# Patient Record
Sex: Male | Born: 1945 | Race: White | Hispanic: No | Marital: Married | Smoking: Former smoker
Health system: Southern US, Community
[De-identification: ages and names within clinical notes are randomized; demographics above are authoritative.]

## PROBLEM LIST (undated history)

## (undated) DIAGNOSIS — I219 Acute myocardial infarction, unspecified: Secondary | ICD-10-CM

## (undated) DIAGNOSIS — L409 Psoriasis, unspecified: Secondary | ICD-10-CM

## (undated) DIAGNOSIS — E785 Hyperlipidemia, unspecified: Secondary | ICD-10-CM

## (undated) DIAGNOSIS — E119 Type 2 diabetes mellitus without complications: Secondary | ICD-10-CM

## (undated) DIAGNOSIS — C9591 Leukemia, unspecified, in remission: Secondary | ICD-10-CM

## (undated) DIAGNOSIS — R0600 Dyspnea, unspecified: Secondary | ICD-10-CM

## (undated) DIAGNOSIS — G459 Transient cerebral ischemic attack, unspecified: Secondary | ICD-10-CM

## (undated) DIAGNOSIS — M1990 Unspecified osteoarthritis, unspecified site: Secondary | ICD-10-CM

## (undated) DIAGNOSIS — K8020 Calculus of gallbladder without cholecystitis without obstruction: Secondary | ICD-10-CM

## (undated) DIAGNOSIS — I10 Essential (primary) hypertension: Secondary | ICD-10-CM

## (undated) DIAGNOSIS — N400 Enlarged prostate without lower urinary tract symptoms: Secondary | ICD-10-CM

## (undated) DIAGNOSIS — Z955 Presence of coronary angioplasty implant and graft: Secondary | ICD-10-CM

## (undated) DIAGNOSIS — I209 Angina pectoris, unspecified: Secondary | ICD-10-CM

## (undated) DIAGNOSIS — Z8719 Personal history of other diseases of the digestive system: Secondary | ICD-10-CM

## (undated) DIAGNOSIS — K219 Gastro-esophageal reflux disease without esophagitis: Secondary | ICD-10-CM

## (undated) DIAGNOSIS — I2510 Atherosclerotic heart disease of native coronary artery without angina pectoris: Secondary | ICD-10-CM

## (undated) DIAGNOSIS — I00 Rheumatic fever without heart involvement: Secondary | ICD-10-CM

## (undated) DIAGNOSIS — K5790 Diverticulosis of intestine, part unspecified, without perforation or abscess without bleeding: Secondary | ICD-10-CM

## (undated) DIAGNOSIS — K2270 Barrett's esophagus without dysplasia: Secondary | ICD-10-CM

## (undated) HISTORY — PX: POLYPECTOMY: SHX149

## (undated) HISTORY — DX: Rheumatic fever without heart involvement: I00

## (undated) HISTORY — PX: CORONARY ARTERY BYPASS GRAFT: SHX141

## (undated) HISTORY — DX: Essential (primary) hypertension: I10

## (undated) HISTORY — DX: Type 2 diabetes mellitus without complications: E11.9

## (undated) HISTORY — DX: Hyperlipidemia, unspecified: E78.5

## (undated) HISTORY — DX: Diverticulosis of intestine, part unspecified, without perforation or abscess without bleeding: K57.90

## (undated) HISTORY — DX: Unspecified osteoarthritis, unspecified site: M19.90

## (undated) HISTORY — DX: Benign prostatic hyperplasia without lower urinary tract symptoms: N40.0

## (undated) HISTORY — PX: APPENDECTOMY: SHX54

## (undated) HISTORY — PX: TONSILLECTOMY AND ADENOIDECTOMY: SUR1326

## (undated) HISTORY — PX: UMBILICAL HERNIA REPAIR: SHX196

## (undated) HISTORY — DX: Barrett's esophagus without dysplasia: K22.70

## (undated) HISTORY — DX: Calculus of gallbladder without cholecystitis without obstruction: K80.20

## (undated) HISTORY — DX: Atherosclerotic heart disease of native coronary artery without angina pectoris: I25.10

## (undated) HISTORY — PX: CORONARY ANGIOPLASTY: SHX604

## (undated) HISTORY — DX: Presence of coronary angioplasty implant and graft: Z95.5

---

## 1996-06-23 DIAGNOSIS — Z955 Presence of coronary angioplasty implant and graft: Secondary | ICD-10-CM

## 1996-06-23 HISTORY — DX: Presence of coronary angioplasty implant and graft: Z95.5

## 2001-12-15 ENCOUNTER — Encounter: Payer: Self-pay | Admitting: Emergency Medicine

## 2001-12-15 ENCOUNTER — Inpatient Hospital Stay (HOSPITAL_COMMUNITY): Admission: EM | Admit: 2001-12-15 | Discharge: 2001-12-22 | Payer: Self-pay | Admitting: Emergency Medicine

## 2002-05-04 ENCOUNTER — Inpatient Hospital Stay (HOSPITAL_COMMUNITY): Admission: EM | Admit: 2002-05-04 | Discharge: 2002-05-06 | Payer: Self-pay | Admitting: Internal Medicine

## 2002-05-23 ENCOUNTER — Encounter: Payer: Self-pay | Admitting: *Deleted

## 2002-05-23 ENCOUNTER — Inpatient Hospital Stay (HOSPITAL_COMMUNITY): Admission: EM | Admit: 2002-05-23 | Discharge: 2002-05-24 | Payer: Self-pay | Admitting: *Deleted

## 2002-08-05 ENCOUNTER — Inpatient Hospital Stay (HOSPITAL_COMMUNITY): Admission: EM | Admit: 2002-08-05 | Discharge: 2002-08-23 | Payer: Self-pay | Admitting: Emergency Medicine

## 2002-08-05 ENCOUNTER — Encounter: Payer: Self-pay | Admitting: Emergency Medicine

## 2002-08-16 ENCOUNTER — Encounter: Payer: Self-pay | Admitting: Cardiothoracic Surgery

## 2002-08-17 ENCOUNTER — Encounter: Payer: Self-pay | Admitting: Cardiothoracic Surgery

## 2002-08-18 ENCOUNTER — Encounter: Payer: Self-pay | Admitting: Cardiothoracic Surgery

## 2002-08-19 ENCOUNTER — Encounter: Payer: Self-pay | Admitting: Cardiothoracic Surgery

## 2002-08-20 ENCOUNTER — Encounter: Payer: Self-pay | Admitting: Cardiothoracic Surgery

## 2004-12-30 ENCOUNTER — Encounter: Payer: Self-pay | Admitting: Internal Medicine

## 2004-12-30 ENCOUNTER — Ambulatory Visit: Payer: Self-pay | Admitting: Internal Medicine

## 2004-12-30 ENCOUNTER — Inpatient Hospital Stay (HOSPITAL_COMMUNITY): Admission: EM | Admit: 2004-12-30 | Discharge: 2005-01-01 | Payer: Self-pay | Admitting: Emergency Medicine

## 2004-12-31 ENCOUNTER — Ambulatory Visit: Payer: Self-pay | Admitting: Cardiology

## 2006-01-29 ENCOUNTER — Ambulatory Visit: Payer: Self-pay | Admitting: Cardiology

## 2006-02-09 ENCOUNTER — Ambulatory Visit: Payer: Self-pay | Admitting: Internal Medicine

## 2006-03-09 ENCOUNTER — Ambulatory Visit: Payer: Self-pay | Admitting: Cardiology

## 2006-03-27 ENCOUNTER — Ambulatory Visit: Payer: Self-pay | Admitting: Cardiovascular Disease

## 2006-03-27 ENCOUNTER — Ambulatory Visit: Payer: Self-pay

## 2006-03-27 ENCOUNTER — Encounter: Payer: Self-pay | Admitting: Cardiology

## 2006-04-03 ENCOUNTER — Ambulatory Visit: Payer: Self-pay | Admitting: Cardiology

## 2006-10-14 ENCOUNTER — Ambulatory Visit: Payer: Self-pay | Admitting: Cardiology

## 2006-10-14 LAB — CONVERTED CEMR LAB
ALT: 22 units/L (ref 0–40)
AST: 26 units/L (ref 0–37)
Albumin: 3.7 g/dL (ref 3.5–5.2)
Alkaline Phosphatase: 53 units/L (ref 39–117)
HDL: 37.6 mg/dL — ABNORMAL LOW (ref >39.0)
Total Bilirubin: 1.1 mg/dL (ref 0.3–1.2)
Total Protein: 6.4 g/dL (ref 6.0–8.3)

## 2007-04-12 ENCOUNTER — Ambulatory Visit: Payer: Self-pay | Admitting: Internal Medicine

## 2007-05-05 ENCOUNTER — Encounter
Admission: RE | Admit: 2007-05-05 | Discharge: 2007-08-03 | Payer: Self-pay | Admitting: Physical Medicine & Rehabilitation

## 2007-05-05 ENCOUNTER — Ambulatory Visit: Payer: Self-pay | Admitting: Physical Medicine & Rehabilitation

## 2007-05-24 ENCOUNTER — Ambulatory Visit (HOSPITAL_COMMUNITY)
Admission: RE | Admit: 2007-05-24 | Discharge: 2007-05-24 | Payer: Self-pay | Admitting: Physical Medicine & Rehabilitation

## 2007-05-31 ENCOUNTER — Ambulatory Visit: Payer: Self-pay | Admitting: Internal Medicine

## 2007-06-14 ENCOUNTER — Ambulatory Visit: Payer: Self-pay | Admitting: Physical Medicine & Rehabilitation

## 2007-07-22 ENCOUNTER — Ambulatory Visit: Payer: Self-pay | Admitting: Internal Medicine

## 2007-07-22 LAB — CONVERTED CEMR LAB
CO2: 31 meq/L (ref 19–32)
Chloride: 103 meq/L (ref 96–112)
Creatinine, Ser: 1 mg/dL (ref 0.4–1.5)
GFR calc non Af Amer: 81 mL/min
Potassium: 3.4 meq/L — ABNORMAL LOW (ref 3.5–5.1)
Sodium: 140 meq/L (ref 135–145)

## 2007-08-04 ENCOUNTER — Ambulatory Visit: Payer: Self-pay | Admitting: Internal Medicine

## 2007-08-04 LAB — CONVERTED CEMR LAB
BUN: 15 mg/dL (ref 6–23)
Chloride: 105 meq/L (ref 96–112)
Creatinine, Ser: 1.1 mg/dL (ref 0.4–1.5)
GFR calc non Af Amer: 73 mL/min
Glucose, Bld: 163 mg/dL — ABNORMAL HIGH (ref 70–99)

## 2007-09-03 ENCOUNTER — Ambulatory Visit: Payer: Self-pay | Admitting: Internal Medicine

## 2007-09-03 LAB — CONVERTED CEMR LAB
CO2: 30 meq/L (ref 19–32)
Creatinine, Ser: 1 mg/dL (ref 0.4–1.5)
GFR calc Af Amer: 98 mL/min
GFR calc non Af Amer: 81 mL/min
Hgb A1c MFr Bld: 6.3 % — ABNORMAL HIGH (ref 4.6–6.0)
Sodium: 139 meq/L (ref 135–145)
TSH: 2.48 microintl units/mL (ref 0.35–5.50)

## 2007-09-07 ENCOUNTER — Ambulatory Visit: Payer: Self-pay | Admitting: Internal Medicine

## 2007-10-11 ENCOUNTER — Ambulatory Visit: Payer: Self-pay | Admitting: Internal Medicine

## 2007-10-11 LAB — CONVERTED CEMR LAB
BUN: 16 mg/dL (ref 6–23)
CO2: 30 meq/L (ref 19–32)
Calcium: 9.9 mg/dL (ref 8.4–10.5)
Chloride: 110 meq/L (ref 96–112)
Creatinine, Ser: 0.9 mg/dL (ref 0.4–1.5)
GFR calc Af Amer: 111 mL/min
GFR calc non Af Amer: 92 mL/min
Glucose, Bld: 90 mg/dL (ref 70–99)
Potassium: 4 meq/L (ref 3.5–5.1)
Sodium: 144 meq/L (ref 135–145)
TSH: 0.75 microintl units/mL (ref 0.35–5.50)

## 2008-02-14 ENCOUNTER — Ambulatory Visit: Payer: Self-pay | Admitting: Internal Medicine

## 2008-03-17 ENCOUNTER — Ambulatory Visit: Payer: Self-pay | Admitting: Internal Medicine

## 2008-03-17 LAB — CONVERTED CEMR LAB
BUN: 18 mg/dL (ref 6–23)
CO2: 30 meq/L (ref 19–32)
Chloride: 109 meq/L (ref 96–112)
Creatinine, Ser: 0.8 mg/dL (ref 0.4–1.5)
Glucose, Bld: 107 mg/dL — ABNORMAL HIGH (ref 70–99)
Potassium: 4.1 meq/L (ref 3.5–5.1)
Sodium: 142 meq/L (ref 135–145)

## 2008-06-21 DIAGNOSIS — E785 Hyperlipidemia, unspecified: Secondary | ICD-10-CM | POA: Insufficient documentation

## 2008-06-21 DIAGNOSIS — I10 Essential (primary) hypertension: Secondary | ICD-10-CM

## 2008-06-21 DIAGNOSIS — I2510 Atherosclerotic heart disease of native coronary artery without angina pectoris: Secondary | ICD-10-CM | POA: Insufficient documentation

## 2008-07-13 ENCOUNTER — Ambulatory Visit: Payer: Self-pay | Admitting: Internal Medicine

## 2008-07-13 LAB — CONVERTED CEMR LAB
ALT: 18 units/L (ref 0–53)
AST: 21 units/L (ref 0–37)
Calcium: 9.5 mg/dL (ref 8.4–10.5)
Chloride: 105 meq/L (ref 96–112)
GFR calc Af Amer: 127 mL/min
GFR calc non Af Amer: 105 mL/min
Sodium: 143 meq/L (ref 135–145)

## 2008-08-11 ENCOUNTER — Ambulatory Visit: Payer: Self-pay | Admitting: Internal Medicine

## 2008-09-17 ENCOUNTER — Emergency Department (HOSPITAL_COMMUNITY): Admission: EM | Admit: 2008-09-17 | Discharge: 2008-09-18 | Payer: Self-pay | Admitting: Podiatry

## 2008-09-25 ENCOUNTER — Ambulatory Visit: Payer: Self-pay | Admitting: Internal Medicine

## 2008-09-25 ENCOUNTER — Encounter: Payer: Self-pay | Admitting: Internal Medicine

## 2008-11-07 ENCOUNTER — Telehealth (INDEPENDENT_AMBULATORY_CARE_PROVIDER_SITE_OTHER): Payer: Self-pay | Admitting: *Deleted

## 2009-04-04 ENCOUNTER — Telehealth: Payer: Self-pay | Admitting: Internal Medicine

## 2009-05-28 ENCOUNTER — Telehealth: Payer: Self-pay | Admitting: Internal Medicine

## 2009-06-08 ENCOUNTER — Telehealth: Payer: Self-pay | Admitting: Internal Medicine

## 2009-06-11 ENCOUNTER — Encounter: Payer: Self-pay | Admitting: Internal Medicine

## 2009-06-12 ENCOUNTER — Telehealth: Payer: Self-pay | Admitting: Internal Medicine

## 2009-06-14 ENCOUNTER — Telehealth: Payer: Self-pay | Admitting: Internal Medicine

## 2009-06-18 ENCOUNTER — Telehealth: Payer: Self-pay | Admitting: Internal Medicine

## 2009-06-19 ENCOUNTER — Encounter: Payer: Self-pay | Admitting: Internal Medicine

## 2009-06-28 ENCOUNTER — Telehealth: Payer: Self-pay | Admitting: Internal Medicine

## 2009-07-01 ENCOUNTER — Inpatient Hospital Stay (HOSPITAL_COMMUNITY): Admission: EM | Admit: 2009-07-01 | Discharge: 2009-07-01 | Payer: Self-pay | Admitting: Emergency Medicine

## 2009-07-01 ENCOUNTER — Ambulatory Visit: Payer: Self-pay | Admitting: Cardiology

## 2009-07-02 ENCOUNTER — Telehealth: Payer: Self-pay | Admitting: Internal Medicine

## 2009-07-04 ENCOUNTER — Telehealth (INDEPENDENT_AMBULATORY_CARE_PROVIDER_SITE_OTHER): Payer: Self-pay | Admitting: *Deleted

## 2009-07-26 ENCOUNTER — Encounter: Payer: Self-pay | Admitting: Internal Medicine

## 2009-08-06 ENCOUNTER — Ambulatory Visit: Payer: Self-pay | Admitting: Internal Medicine

## 2009-09-15 ENCOUNTER — Encounter: Payer: Self-pay | Admitting: Internal Medicine

## 2010-01-21 ENCOUNTER — Ambulatory Visit: Payer: Self-pay | Admitting: Internal Medicine

## 2010-02-19 ENCOUNTER — Encounter: Payer: Self-pay | Admitting: Internal Medicine

## 2010-03-08 ENCOUNTER — Telehealth (INDEPENDENT_AMBULATORY_CARE_PROVIDER_SITE_OTHER): Payer: Self-pay | Admitting: *Deleted

## 2010-03-14 ENCOUNTER — Telehealth: Payer: Self-pay | Admitting: Internal Medicine

## 2010-03-26 ENCOUNTER — Telehealth: Payer: Self-pay | Admitting: Internal Medicine

## 2010-07-23 NOTE — Assessment & Plan Note (Signed)
Summary: eph/per nurse Nivida/jss  Medications Added POTASSIUM CHLORIDE CRYS CR 20 MEQ CR-TABS (POTASSIUM CHLORIDE CRYS CR) 1 by mouth daily LISINOPRIL 20 MG TABS (LISINOPRIL) 1 by mouth two times a day TRIAMTERENE-HCTZ 37.5-25 MG TABS (TRIAMTERENE-HCTZ) 1 by mouth daily VERAPAMIL HCL CR 180 MG CR-TABS (VERAPAMIL HCL) 1 by mouth daily ALLOPURINOL 300 MG TABS (ALLOPURINOL) 1 by mouth daily AMLODIPINE BESYLATE 5 MG TABS (AMLODIPINE BESYLATE) 1 tablet every day      Allergies Added:   Primary Provider:  Dara Lords, M.D.   History of Present Illness: Mr. Edmondson is a 65 year old gentleman. He was last seen in the fall. He has a history of CAD, hypertension, dyslipidemia.  The patient is status post CABG in 1991 had an inferior wall MI 1998 redo CABG in 2004 (left radial artery to PDA; SVG to OM1/distal circumflex). I kept him on Plavix therapy because of extensive disease.  since seen, he has had some problems with his bp medicines with frequent calls, nurse visits.  He actually had 1 ER visit in January of this year.  See dictation.  He was placed on verapamil.  Note he also had chest pressure during this visit.  He did not have any work up scheduled.  Since that visit he denies chest pain.  His breathing has been ok.  His bp has been lablile, maybe improving of late.  He was seen at the [place] in salsibury.  He had labs drawn.  Suggestion was made for Norvasc instead of Verapamil because of possible drug interaction with simvistatin.  Current Medications (verified): 1)  Avodart 0.5 Mg Caps (Dutasteride) .Marland Kitchen.. 1 Once Daily 2)  Prilosec 20 Mg Cpdr (Omeprazole) 3)  Aspirin Adult Low Strength 81 Mg Tbec (Aspirin) .... Once Daily 4)  Mens Multivitamin Plus  Tabs (Multiple Vitamins-Minerals) .Marland Kitchen.. 1 Once Daily 5)  Potassium Chloride Crys Cr 20 Meq Cr-Tabs (Potassium Chloride Crys Cr) .Marland Kitchen.. 1 By Mouth Daily 6)  Plavix 75 Mg Tabs (Clopidogrel Bisulfate) .... Take 1 Tab P.m. 7)  Fish Oil 1200 Mg  Caps (Omega-3 Fatty Acids) .Marland Kitchen.. 1 Cap Evening Only 8)  Metoprolol Succinate 50 Mg Xr24h-Tab (Metoprolol Succinate) .Marland Kitchen.. 1 Tablet Every Day 9)  Simvastatin 80 Mg Tabs (Simvastatin) .Marland Kitchen.. 1 Tablet Every Day At Bedtime 10)  Lisinopril 20 Mg Tabs (Lisinopril) .Marland Kitchen.. 1 By Mouth Two Times A Day 11)  Triamterene-Hctz 37.5-25 Mg Tabs (Triamterene-Hctz) .Marland Kitchen.. 1 By Mouth Daily 12)  Verapamil Hcl Cr 180 Mg Cr-Tabs (Verapamil Hcl) .Marland Kitchen.. 1 By Mouth Daily 13)  Allopurinol 300 Mg Tabs (Allopurinol) .Marland Kitchen.. 1 By Mouth Daily  Allergies (verified): 1)  ! Ticlid 2)  ! Codeine  Past History:  Past Medical History: Last updated: 07-12-08 CAD:  CABG 1991, 2004;  IWMI  1998.  S/P PTCA  stensts to SVG ot OM last in 2003. Hypertension Dyslipidemia BPH Gout Hx rheumatic fever as a child.   Past Surgical History: Last updated: 2008-07-12 CABG 1991 (LIMA to LAD/Diag;  SVG to RCA;  SVG to OM) CABG 2004 (L radial to PDA; SVG to OM1/ distal LCx) s/ p tonsillectomy s/p appendectomy s/ hernia repair  Family History: Last updated: 07-12-2008 Father died age 64 of heart problems Mother:  Hx CAD, CVA, DM Paternal GF:  AAA.  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  Vital Signs:  Patient profile:   65 year old male Height:      72 inches Weight:  203 pounds BMI:     27.63 Pulse rate:   58 / minute Resp:     16 per minute BP sitting:   210 / 96  (right arm)  Vitals Entered By: Marrion Coy, CNA (August 06, 2009 4:10 PM)  Physical Exam  Additional Exam:  HEENT:  Normocephalic, atraumatic. EOMI, PERRLA.  Neck: JVP is normal. No thyromegaly. No bruits.  Lungs: clear to auscultation. No rales no wheezes.  Heart: Regular rate and rhythm. Normal S1, S2. No S3.   No significant murmurs. PMI not displaced.  Abdomen:  Supple, nontender. Normal bowel sounds. No masses. No hepatomegaly.  Extremities:   Good distal pulses  throughout. No lower extremity edema.  Musculoskeletal :moving all extremities.  Neuro:   alert and oriented x3.    EKG  Procedure date:  08/06/2009  Findings:      Sinus bradycardia  58 bpm.  Impression & Recommendations:  Problem # 1:  HYPERTENSION, BENIGN (ICD-401.1) This has been very difficult to control  The patient was on Norvasc in the past then had problems with it.  BP is high on arrival to clinic but improved to 148/90.  I would recomm stopping verapamil.  Start norvasc 2.5 mg per day.   I need labs that were drawn at the [place] on 2/3.  He is on an ACEI, K, Maxzide.   He will fax. Send BP log in 4 wks.  Problem # 2:  HYPERLIPIDEMIA-MIXED (ICD-272.4) Continue.  Patient to send labs. His updated medication list for this problem includes:    Simvastatin 80 Mg Tabs (Simvastatin) .Marland Kitchen... 1 tablet every day at bedtime  Problem # 3:  CAD, NATIVE VESSEL (ICD-414.01) I am not convinced of any active issues.  Continue meds.  Patient Instructions: 1)  Your physician has recommended you make the following change in your medication: start NORVASC 5 mg one half every day 2)  Your physician wants you to follow-up in: 6 months  You will receive a reminder letter in the mail two months in advance. If you don't receive a letter, please call our office to schedule the follow-up appointment. Prescriptions: AMLODIPINE BESYLATE 5 MG TABS (AMLODIPINE BESYLATE) 1 tablet every day  #30 x 6   Entered by:   Layne Benton, RN, BSN   Authorized by:   Sherrill Raring, MD, [facility]   Signed by:   Layne Benton, RN, BSN on 08/06/2009   Method used:   [facility]  [address] [phone]       [address]       [place], [place]  [address]       Ph: [phone] or [phone]       Fax: [phone]   RxID:   [id number]

## 2010-07-23 NOTE — Progress Notes (Signed)
Summary: refill meds    Phone Note Refill Request Call back at Home Phone [phone] Message from:  Patient on March 26, 2010 4:32 PM  Refills Requested: Medication #1:  POTASSIUM CHLORIDE CRYS CR 20 MEQ CR-TABS 1 by mouth [facility] [phone]   Method Requested: Fax to Local Pharmacy Initial call taken by: Lorne Skeens,  March 26, 2010 4:33 PM    Prescriptions: POTASSIUM CHLORIDE CRYS CR 20 MEQ CR-TABS (POTASSIUM CHLORIDE CRYS CR) 1 by mouth daily  #30 x 6   Entered by:   Burnett Kanaris, CNA   Authorized by:   Sherrill Raring, MD, [facility]   Signed by:   Burnett Kanaris, CNA on 03/27/2010   Method used:   [facility]  [address] [phone]* (retail)       [address]       [place], [place]  [address]       Ph: [phone] or [phone]       Fax: [phone]   RxID:   [id number]

## 2010-07-23 NOTE — Assessment & Plan Note (Signed)
Summary: per check out/sf  Medications Added PRILOSEC 20 MG CPDR (OMEPRAZOLE) 1 tab two times a day AMLODIPINE BESYLATE 5 MG TABS (AMLODIPINE BESYLATE) 1/2 tab once daily      Allergies Added:   Visit Type:  Follow-up Primary Provider:  Dara Stone, M.D.  CC:  no complaints.  History of Present Illness: Brandon Stone is a 65 year old gentleman. He was last seen in the fall. He has a history of CAD, hypertension, dyslipidemia.  The patient is status post CABG in 1991 had an inferior wall MI 1998 redo CABG in 2004 (left radial artery to PDA; SVG to OM1/distal circumflex). I kept him on Plavix therapy because of extensive disease. I saw him in clinc in February.  Since then he mailed in readings of his BPs which are overall in fairly good control. SInce seen he has done well.  NO chest pain  No shortness of breath.  Current Medications (verified): 1)  Avodart 0.5 Mg Caps (Dutasteride) .Marland Kitchen.. 1 Once Daily 2)  Prilosec 20 Mg Cpdr (Omeprazole) .Marland Kitchen.. 1 Tab Two Times A Day 3)  Aspirin Adult Low Strength 81 Mg Tbec (Aspirin) .... Once Daily 4)  Mens Multivitamin Plus  Tabs (Multiple Vitamins-Minerals) .Marland Kitchen.. 1 Once Daily 5)  Potassium Chloride Crys Cr 20 Meq Cr-Tabs (Potassium Chloride Crys Cr) .Marland Kitchen.. 1 By Mouth Daily 6)  Plavix 75 Mg Tabs (Clopidogrel Bisulfate) .... Take 1 Tab P.m. 7)  Fish Oil 1200 Mg Caps (Omega-3 Fatty Acids) .Marland Kitchen.. 1 Cap Evening Only 8)  Metoprolol Succinate 50 Mg Xr24h-Tab (Metoprolol Succinate) .Marland Kitchen.. 1 Tablet Every Day 9)  Simvastatin 80 Mg Tabs (Simvastatin) .Marland Kitchen.. 1 Tablet Every Day At Bedtime 10)  Lisinopril 20 Mg Tabs (Lisinopril) .Marland Kitchen.. 1 By Mouth Two Times A Day 11)  Triamterene-Hctz 37.5-25 Mg Tabs (Triamterene-Hctz) .Marland Kitchen.. 1 By Mouth Daily 12)  Allopurinol 300 Mg Tabs (Allopurinol) .Marland Kitchen.. 1 By Mouth Daily 13)  Amlodipine Besylate 5 Mg Tabs (Amlodipine Besylate) .... 1/2 Tab Once Daily  Allergies (verified): 1)  ! Ticlid 2)  ! Codeine  Past History:  Past medical,  surgical, family and social histories (including risk factors) reviewed, and no changes noted (except as noted below).  Past Medical History: Reviewed history from 06/21/2008 and no changes required. CAD:  CABG 1991, 2004;  IWMI  1998.  S/P PTCA  stensts to SVG ot OM last in 2003. Hypertension Dyslipidemia BPH Gout Hx rheumatic fever as a child.   Past Surgical History: Reviewed history from 06/21/2008 and no changes required. CABG 1991 (LIMA to LAD/Diag;  SVG to RCA;  SVG to OM) CABG 2004 (L radial to PDA; SVG to OM1/ distal LCx) s/ p tonsillectomy s/p appendectomy s/ hernia repair  Family History: Reviewed history from 06/21/2008 and no changes required. Father died age 11 of heart problems Mother:  Hx CAD, CVA, DM Paternal GF:  AAA.  Social History: Reviewed history from 09/25/2008 and no changes required. 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  Vital Signs:  Patient profile:   65 year old male Height:      72 inches Weight:      208 pounds BMI:     28.31 Pulse rate:   64 / minute BP sitting:   132 / 80  (left arm) Cuff size:   regular  Vitals Entered By: Brandon Kanaris, CNA (January 21, 2010 4:20 PM)  Physical Exam  Additional Exam:  Patient is in NAD HEENT:  Normocephalic, atraumatic. EOMI,  PERRLA.  Neck: JVP is normal. No thyromegaly. No bruits.  Lungs: clear to auscultation. No rales no wheezes.  Heart: Regular rate and rhythm. Normal S1, S2. No S3.   No significant murmurs. PMI not displaced.  Abdomen:  Supple, nontender. Normal bowel sounds. No masses. No hepatomegaly.  Extremities:   Good distal pulses throughout. No lower extremity edema.  Musculoskeletal :moving all extremities.  Neuro:   alert and oriented x3.    EKG  Procedure date:  01/21/2010  Findings:      NSR.  65 bpm..  Inferior MI.  Impression & Recommendations:  Problem # 1:  CAD, NATIVE VESSEL (ICD-414.01) Stable.  No signs to suggesti  ischemia.  Keep on same regimen.  Problem # 2:  HYPERLIPIDEMIA-MIXED (ICD-272.4) I have asked him to cut Zocor to 40 once daily.  He is due to have lipids checked at [place].  Will have labs faxed here. His updated medication list for this problem includes:    Simvastatin 80 Mg Tabs (Simvastatin) .Marland Kitchen... 1 tablet every day at bedtime  Problem # 3:  HYPERTENSION, BENIGN (ICD-401.1) BP readings at home recently 120s to 160s, mainly in 130s.  Keep on same regimen.  Patient Instructions: 1)  Your physician wants you to follow-up in: APRIL 2012  You will receive a reminder letter in the mail two months in advance. If you don't receive a letter, please call our office to schedule the follow-up appointment.

## 2010-07-23 NOTE — Progress Notes (Signed)
Summary: QUESTIONS ABOUT MEDICATIONS   Phone Note Call from Patient Call back at Work Phone [phone]   Caller: Patient Summary of Call: PT CALLING WITH QUESTION ABOUT HIS MEDICATIONS Initial call taken by: Judie Grieve,  July 02, 2009 11:50 AM  Follow-up for Phone Call        spoke with patient- According to pt. he was in [facility]  this past weekend with high B/P. While in the hospital pt. was seen by Dr. Tenny Craw MD. Pt. states the instructions given for him was  to call the office for Dr. Tenny Craw to regulate his B/P medications. I let pt. know I will review the hospital  D/C notes from [facility] . I will call him back asap. Ollen Gross, RN, BSN  July 02, 2009 1:49 PM Called pt. back regarding Cardiology consult note per Dr. Tenny Craw. pt. needs a F/U appointment  with Dr. Tenny Craw in the office. Pt. vebalized understanding. Pt's phone call was transfer to [facility] scheduler, for patient to  make a F/U appointment  with  Dr. Tenny Craw.  Follow-up by: Ollen Gross, RN, BSN,  July 02, 2009 2:13 PM

## 2010-07-23 NOTE — Progress Notes (Signed)
Summary: rtn call to Dr. Tenny Craw- LM  Medications Added METOPROLOL TARTRATE 25 MG TABS (METOPROLOL TARTRATE) Take one tablet by mouth twice a day CRESTOR 10 MG TABS (ROSUVASTATIN CALCIUM) Take one tablet by mouth daily. DIOVAN 80 MG TABS (VALSARTAN) Please take one pill by mouth two times a day.       Phone Note Call from Patient Call back at Home Phone [phone]   Caller: Patient Reason for Call: Talk to Nurse, Talk to Doctor Summary of Call: pt rtn call to Dr. Tenny Craw to obtain medication changes Initial call taken by: Omer Jack,  March 14, 2010 11:42 AM  Follow-up for Phone Call        left pt a voicemail. Whitney Maeola Sarah RN  March 14, 2010 11:53 AM  Spoke to pt. who stated that the [place] already changed his medication to Crestor 10mg  by mouth daily on Aug.30th when he had his blood work drawn. He is having blood work done at the [place] to check his LFT/lipids in mid October and will have them fax that [facility] [place] 25mg  by mouth two times a day.    New/Updated Medications: METOPROLOL TARTRATE 25 MG TABS (METOPROLOL TARTRATE) Take one tablet by mouth twice a day CRESTOR 10 MG TABS (ROSUVASTATIN CALCIUM) Take one tablet by mouth daily. DIOVAN 80 MG TABS (VALSARTAN) Please take one pill by mouth two times a day.

## 2010-07-23 NOTE — Progress Notes (Signed)
   Labs recieved from [facility] gave to [facility] to scan into EMR. Brandon Stone  March 08, 2010 9:29 AM'

## 2010-07-23 NOTE — Progress Notes (Signed)
Summary: Patients At Home Vitals  Patients At Home Vitals   Imported By: Roderic Ovens 09/27/2009 11:55:52  _____________________________________________________________________  External Attachment:    Type:   Image     Comment:   External Document

## 2010-07-23 NOTE — Consult Note (Signed)
Summary: Consultation Report [facility]  Consultation Report - [facility]   Imported By: Marylou Mccoy 07/20/2009 09:09:44  _____________________________________________________________________  External Attachment:    Type:   Image     Comment:   External Document

## 2010-07-23 NOTE — Progress Notes (Signed)
Summary: review meds   Phone Note Call from Patient Call back at Work Phone [phone]   Caller: Patient Reason for Call: Talk to Nurse Summary of Call: pt just got out of hospital and there was some medication changes and he is very confused and wanted to come into the office today for someone to go over his meds with him Initial call taken by: Omer Jack,  July 04, 2009 12:20 PM  Follow-up for Phone Call        pt to come by the office to review meds.  Sander Nephew, RN PT CAME IN BUT FORGOT HIS MEDICATION LIST.  WE REVIEWED MEDICATIONS AS LISTED ON DISCHARGE INSTRUCTIONS HOWEVER THERE ARE STILL DIFFERENENCES.  PT WILL FAX HIS MED LIST AND THE ONE HE WAS GIVEN AT HOSPITAL FOR OUT REVIEW. I RECIEVED FAXED MEDICATION LISTS AND THEY WERE REVIEWED WITH TOM PICKERING, PHM.  ACCORDING TO CONSULT NOTE FROM DR ROTHBART PT SHOULD STOP HCTZ START DYAZIDE 37.5/25MG  DAILY,  START VERAPIMIL 180 MG DAILY,  DECREASE POTASSIUM TO 20 MEQ DAILY AND TAKE NIFEDIPINE ONLY IF BP IS ABOVE 140/90. ALL OTHER MEDS TO REMAIN THE SAME.  ATTEPMTED TO CALL PT AT WORK NUMBER HOWEVER THEY DIDN'T GET HIM TO THE PHONE.  WILL CALL BACK THIS AFTERNOON.  PAM FLEMING-HAYES,RN 07/05/09--3:15PM--Spoke with pt and gave him following inform. about these meds--d/c HCTZ--start dyazide 1 tablet once daily--start verapimil 180mg  once daily--metoprolo lsucc. 50mg   1 tablet once daily--pt went over his list with me and appears to understand meds--nt Follow-up by: Ledon Snare, RN,  July 05, 2009 3:21 PM

## 2010-07-23 NOTE — Medication Information (Signed)
Summary: MED DOSAGE  MED DOSAGE   Imported By: Marylou Mccoy 07/04/2009 18:24:44  _____________________________________________________________________  External Attachment:    Type:   Image     Comment:   External Document

## 2010-07-23 NOTE — Progress Notes (Signed)
Summary: refill   Phone Note Refill Request   Refills Requested: Medication #1:  HYDROCHLOROTHIAZIDE 12.5 MG CAPS 1 tablet two times a day  Medication #2:  METOPROLOL SUCCINATE 50 MG XR24H-TAB 1 tablet every day 1 weeks worth [facility] [place], also please resend the prior refills to the [place], they did not receive   Method Requested: Fax to Local Pharmacy Initial call taken by: Migdalia Dk,  June 28, 2009 4:28 PM  Follow-up for Phone Call        [facility] [address] [place] HCTZ 12.5 mg 180 x3 and Met.Succ 50 mg 90x3 refills  Follow-up by: Oswald Hillock,  June 29, 2009 11:24 AM    Prescriptions: METOPROLOL SUCCINATE 50 MG XR24H-TAB (METOPROLOL SUCCINATE) 1 tablet every day  #90 x 3   Entered by:   Oswald Hillock   Authorized by:   Sherrill Raring, MD, [facility]   Signed by:   Oswald Hillock on 06/29/2009   Method used:   [facility]ed to ...       [facility]  [address] [phone]* (retail)       [address]       [place], [place]  [address]       Ph: [phone] or [phone]       Fax: [phone]   RxID:   [id number] HYDROCHLOROTHIAZIDE 12.5 MG CAPS (HYDROCHLOROTHIAZIDE) 1 tablet two times a day  #180 x 3   Entered by:   Oswald Hillock   Authorized by:   Sherrill Raring, MD, [facility]   Signed by:   Oswald Hillock on 06/29/2009   Method used:   [facility]axed to ...       [facility]  [address] [phone]* (retail)       [address]       [place], [place]  [address]       Ph: [phone] or [phone]       Fax: [phone]   RxID:   [id number]

## 2010-07-23 NOTE — Miscellaneous (Signed)
  Clinical Lists Changes  Orders: Added new Service order of EKG w/ Interpretation (93000) - Signed 

## 2010-09-08 LAB — COMPREHENSIVE METABOLIC PANEL
BUN: 13 mg/dL (ref 6–23)
Chloride: 104 mEq/L (ref 96–112)
Creatinine, Ser: 0.98 mg/dL (ref 0.4–1.5)
GFR calc Af Amer: >60 mL/min (ref >60)
GFR calc non Af Amer: >60 mL/min (ref >60)
Glucose, Bld: 181 mg/dL — ABNORMAL HIGH (ref 70–99)
Potassium: 3.3 mEq/L — ABNORMAL LOW (ref 3.5–5.1)
Sodium: 139 mEq/L (ref 135–145)
Total Bilirubin: 0.9 mg/dL (ref 0.3–1.2)
Total Protein: 6 g/dL (ref 6.0–8.3)

## 2010-09-08 LAB — CBC
HCT: 41.1 % (ref 39.0–52.0)
HCT: 42.3 % (ref 39.0–52.0)
Hemoglobin: 14.5 g/dL (ref 13.0–17.0)
Platelets: 157 10*3/uL (ref 150–400)
RBC: 4.43 MIL/uL (ref 4.22–5.81)
RDW: 13.3 % (ref 11.5–15.5)
WBC: 10 10*3/uL (ref 4.0–10.5)

## 2010-09-08 LAB — CARDIAC PANEL(CRET KIN+CKTOT+MB+TROPI)
CK, MB: 2.3 ng/mL (ref 0.3–4.0)
Relative Index: INVALID (ref 0.0–2.5)
Total CK: 68 U/L (ref 7–232)

## 2010-09-08 LAB — DIFFERENTIAL
Basophils Absolute: 0 10*3/uL (ref 0.0–0.1)
Basophils Relative: 0 % (ref 0–1)
Basophils Relative: 0 % (ref 0–1)
Eosinophils Absolute: 0.1 10*3/uL (ref 0.0–0.7)
Eosinophils Relative: 1 % (ref 0–5)
Lymphs Abs: 4.9 10*3/uL — ABNORMAL HIGH (ref 0.7–4.0)
Monocytes Relative: 8 % (ref 3–12)
Neutrophils Relative %: 40 % — ABNORMAL LOW (ref 43–77)
Neutrophils Relative %: 43 % (ref 43–77)

## 2010-09-08 LAB — BASIC METABOLIC PANEL
BUN: 15 mg/dL (ref 6–23)
Chloride: 104 mEq/L (ref 96–112)
Creatinine, Ser: 0.79 mg/dL (ref 0.4–1.5)
GFR calc Af Amer: >60 mL/min (ref >60)
GFR calc non Af Amer: >60 mL/min (ref >60)
Glucose, Bld: 105 mg/dL — ABNORMAL HIGH (ref 70–99)

## 2010-09-08 LAB — URINALYSIS, ROUTINE W REFLEX MICROSCOPIC
Glucose, UA: NEGATIVE mg/dL
Protein, ur: NEGATIVE mg/dL
Urobilinogen, UA: 0.2 mg/dL (ref 0.0–1.0)

## 2010-09-08 LAB — POCT CARDIAC MARKERS
CKMB, poc: 1.4 ng/mL (ref 1.0–8.0)
Myoglobin, poc: 69.6 ng/mL (ref 12–200)

## 2010-09-20 ENCOUNTER — Other Ambulatory Visit: Payer: Self-pay | Admitting: Internal Medicine

## 2010-10-03 LAB — CBC
HCT: 45.3 % (ref 39.0–52.0)
Hemoglobin: 15.4 g/dL (ref 13.0–17.0)
RBC: 4.91 MIL/uL (ref 4.22–5.81)
RDW: 12.9 % (ref 11.5–15.5)
WBC: 9.2 10*3/uL (ref 4.0–10.5)

## 2010-10-03 LAB — HEPATIC FUNCTION PANEL
Albumin: 3.9 g/dL (ref 3.5–5.2)
Alkaline Phosphatase: 53 U/L (ref 39–117)
Indirect Bilirubin: 0.6 mg/dL (ref 0.3–0.9)
Total Bilirubin: 0.7 mg/dL (ref 0.3–1.2)
Total Protein: 6.6 g/dL (ref 6.0–8.3)

## 2010-10-03 LAB — POCT I-STAT, CHEM 8
Calcium, Ion: 1.17 mmol/L (ref 1.12–1.32)
Creatinine, Ser: 0.8 mg/dL (ref 0.4–1.5)
Glucose, Bld: 109 mg/dL — ABNORMAL HIGH (ref 70–99)
HCT: 46 % (ref 39.0–52.0)
Hemoglobin: 15.6 g/dL (ref 13.0–17.0)

## 2010-10-03 LAB — BASIC METABOLIC PANEL
GFR calc Af Amer: >60 mL/min (ref >60)
GFR calc non Af Amer: >60 mL/min (ref >60)
Glucose, Bld: 112 mg/dL — ABNORMAL HIGH (ref 70–99)
Potassium: 3.4 mEq/L — ABNORMAL LOW (ref 3.5–5.1)
Sodium: 137 mEq/L (ref 135–145)

## 2010-10-03 LAB — PROTIME-INR
INR: 0.9 (ref 0.00–1.49)
Prothrombin Time: 12.3 seconds (ref 11.6–15.2)

## 2010-10-03 LAB — LIPASE, BLOOD: Lipase: 27 U/L (ref 11–59)

## 2010-10-03 LAB — CK TOTAL AND CKMB (NOT AT ARMC): CK, MB: 1.4 ng/mL (ref 0.3–4.0)

## 2010-10-03 LAB — MAGNESIUM: Magnesium: 2 mg/dL (ref 1.5–2.5)

## 2010-10-03 LAB — APTT: aPTT: 27 seconds (ref 24–37)

## 2010-10-30 ENCOUNTER — Encounter: Payer: Self-pay | Admitting: Internal Medicine

## 2010-10-31 ENCOUNTER — Encounter: Payer: Self-pay | Admitting: Internal Medicine

## 2010-10-31 ENCOUNTER — Ambulatory Visit (INDEPENDENT_AMBULATORY_CARE_PROVIDER_SITE_OTHER): Payer: 59 | Admitting: Internal Medicine

## 2010-10-31 DIAGNOSIS — J029 Acute pharyngitis, unspecified: Secondary | ICD-10-CM

## 2010-10-31 DIAGNOSIS — I10 Essential (primary) hypertension: Secondary | ICD-10-CM

## 2010-10-31 DIAGNOSIS — E785 Hyperlipidemia, unspecified: Secondary | ICD-10-CM

## 2010-10-31 DIAGNOSIS — I2510 Atherosclerotic heart disease of native coronary artery without angina pectoris: Secondary | ICD-10-CM

## 2010-10-31 NOTE — Patient Instructions (Signed)
Your physician wants you to follow-up in: January 2013 you will receive a reminder letter in the mail two months in advance. If you don't receive a letter, please call our office to schedule the follow-up appointment.

## 2010-10-31 NOTE — Progress Notes (Signed)
Brandon Stone is a 65 year old gentleman. He was last seen in the fall. He has a history of CAD, hypertension, dyslipidemia.  The patient is status post CABG in 1991 had an inferior wall MI 1998 redo CABG in 2004 (left radial artery to PDA; SVG to OM1/distal circumflex). I kept him on Plavix therapy because of extensive disease. I saw him in clinc in February.  Since then he mailed in readings of his BPs which are overall in fairly good control. SInce seen he has done well.  He denies chest pains.  Breathing is OK.  He has noted a sore throat over the past few months.  Has not been evaluated for this.  There is focal pain on the R side of his throat.  Allergies  Allergen Reactions  . Codeine   . Ticlopidine Hcl     Current Outpatient Prescriptions  Medication Sig Dispense Refill  . allopurinol (ZYLOPRIM) 300 MG tablet Take 300 mg by mouth daily.        Marland Kitchen amLODipine (NORVASC) 5 MG tablet Take 2.5 mg by mouth daily.        Marland Kitchen aspirin 81 MG tablet Take 81 mg by mouth daily.        . clopidogrel (PLAVIX) 75 MG tablet Take 75 mg by mouth daily.        . finasteride (PROSCAR) 5 MG tablet Take 5 mg by mouth daily.        . metoprolol succinate (TOPROL-XL) 25 MG 24 hr tablet Take 25 mg by mouth. Take 1/2 tablet twice daily       . Multiple Vitamin (MULTIVITAMIN) capsule Take 1 capsule by mouth daily.        . Omega-3 Fatty Acids (FISH OIL) 1200 MG CAPS Take 1 capsule by mouth daily.        Marland Kitchen omeprazole (PRILOSEC) 20 MG capsule Take 20 mg by mouth 2 (two) times daily.        . potassium chloride SA (K-DUR,KLOR-CON) 20 MEQ tablet Take 20 mEq by mouth daily.       . rosuvastatin (CRESTOR) 20 MG tablet Take 10 mg by mouth daily.        Marland Kitchen triamterene-hydrochlorothiazide (MAXZIDE-25) 37.5-25 MG per tablet Take 1 tablet by mouth daily.        . valsartan (DIOVAN) 80 MG tablet Take 40 mg by mouth 2 (two) times daily.       Marland Kitchen DISCONTD: rosuvastatin (CRESTOR) 10 MG tablet Take 10 mg by mouth daily.       Marland Kitchen  DISCONTD: amLODipine (NORVASC) 5 MG tablet TAKE 1 TABLET BY MOUTH EVERY DAY  30 tablet  6  . DISCONTD: metoprolol tartrate (LOPRESSOR) 25 MG tablet Take 25 mg by mouth 2 (two) times daily.          Past Medical History  Diagnosis Date  . CAD (coronary artery disease)      CABG 1991, 2004;  IWMI  1998.  S/P PTCA  stensts to SVG ot OM last in 2003.  Marland Kitchen HTN (hypertension)   . Dyslipidemia   . BPH (benign prostatic hypertrophy)   . Gout   . Rheumatic fever     as a child    Past Surgical History  Procedure Date  . Coronary artery bypass graft     1991 (LIMA to LAD/Diag;  SVG to RCA;  SVG to OM)  . Coronary artery bypass graft     2004 (L radial to PDA; SVG to OM1/ distal LCx)  .  Tonsillectomy   . Appendectomy   . Hernia repair     No family history on file.  History   Social History  . Marital Status: Legally Separated    Spouse Name: N/A    Number of Children: N/A  . Years of Education: N/A   Occupational History  . Not on file.   Social History Main Topics  . Smoking status: Former Games developer  . Smokeless tobacco: Not on file  . Alcohol Use: Not on file  . Drug Use: No  . Sexually Active: Not on file   Other Topics Concern  . Not on file   Social History Narrative  . No narrative on file    Review of Systems:  All systems reviewed.  They are negative to the above problem except as previously stated.  Vital Signs: BP 147/93  Pulse 63  Resp 18  Ht 6' (1.829 m)  Wt 210 lb 12.8 oz (95.618 kg)  BMI 28.59 kg/m2  Physical Exam  HEENT:  Normocephalic, atraumatic. EOMI, PERRLA.  Neck: JVP is normal. No thyromegaly. No bruits. Sl full  No masses Lungs: clear to auscultation. No rales no wheezes.  Heart: Regular rate and rhythm. Normal S1, S2. No S3.   No significant murmurs. PMI not displaced.  Abdomen:  Supple, nontender. Normal bowel sounds. No masses. No hepatomegaly.  Extremities:   Good distal pulses throughout. No lower extremity edema.  Musculoskeletal  :moving all extremities.  Neuro:   alert and oriented x3.  CN II-XII grossly intact.  EKG:  Sinus rhythm.  63 bpm. IWMI.     Assessment and Plan:

## 2010-11-03 DIAGNOSIS — J029 Acute pharyngitis, unspecified: Secondary | ICD-10-CM | POA: Insufficient documentation

## 2010-11-03 NOTE — Assessment & Plan Note (Signed)
BP control at home is good.  I would keep on same regimen.

## 2010-11-03 NOTE — Assessment & Plan Note (Signed)
Doing well.  No symptoms of angina.  Keep on same regimen.

## 2010-11-03 NOTE — Assessment & Plan Note (Signed)
Would continue meds.  Will need to be followed.

## 2010-11-05 NOTE — Assessment & Plan Note (Signed)
[facility]ALTHCARE                            CARDIOLOGY OFFICE NOTE   Brandon Stone, Brandon Stone                         MRN:          [id number]  DATE:09/03/2007                            DOB:          May 26, 1946    IDENTIFICATION:  Brandon Stone is a 65 year old gentleman who was last seen  in cardiology clinic back in October of last year.  Note, he had  previously been followed by Geralynn Rile.   The patient has a history of CAD (CABG in 1991, inferior wall MI in  1998,  Catheterization at that time showed occlusion of the vein graft  to the RCA.  Note, stent to vein graft to OM patent, LIMA to LAD patent,  SVG to OM patent, EF of 45-50%).   Since seen, the patient has had occasional dizziness, he says usually  when he puts his head down and stands up quickly.  If he is dizzy, it is  usually in the morning.  Note, some smells make him dizzy; otherwise, he  said actually when he is in bed he feels great, and it seems like when  he gets up everything is coming down on me.  He notes occasional  palpitations.  No real chest pain.  Breathing is okay.  He did note his  fingers turning white in the cold weather.  He says his circulation is  not too good.   CURRENT MEDICATIONS:  His current medications now include:  1. Avodart 0.5.  2. HCTZ 25.  3. Lisinopril 20 b.i.d.  4. Potassium t.i.d.  5. Toprol XL 100.  6. Prilosec OTC 20.  7. Gemfibrozil 600.  8. Allopurinol 300.  9. Aspirin 81.  10.Multivitamin.  11.Isorbid 30.  12.Lipitor 80.  13.Plavix 75.  14.Fish oil 1200 mg daily.   PHYSICAL EXAMINATION:  GENERAL:  The patient is in no distress.  VITAL SIGNS:  Blood pressure lying 136/81, pulse 48; sitting 133/81,  pulse 50; standing 131/86, pulse 56; at 2 minutes 140/92, pulse 55, and  at 5 minutes 136/95, pulse 61.  The patient a little dizzy at the very  end, very little.  LUNGS:  Clear.  CARDIAC:  Regular rate and rhythm, S1-S2, no murmurs.  ABDOMEN:   Benign.  EXTREMITIES:  No edema.   IMPRESSION:  1. Dizziness, abnormal sensations.  The patient says he has been      feeling worse since his blood pressure regimen was changed actually      back in December.  Note, Wende Bushy, one of our PAs, had called      in with his blood pressure being up to discontinue Altace, increase      lisinopril and increase HCTZ.  I think cutting back to the way he      was and restarting would be more appropriate.  Therefore, today I      would recommend decreasing his hydrochlorothiazide to a half daily,      keep his lisinopril at one time per day.  The patient will call      back to let us know how  he is feeling.  Again, his blood pressure      will run a little high, but would like to rework this.  He      complains of seeing his fingers get a little clamped down.  He has      got good distal pulses throughout.  He may have a little bit of      vasospasm distally.  Question if he is a little on the dry side      with increasing doses of the hydrochlorothiazide.  He may benefit      for his blood pressure needs with Norvasc, but I will wait to hear      from him.  EKG today shows sinus bradycardia with occasional PVCs,      rate of 57 beats per minute.  Inferior wall MI.  Note, compared to      a previous EKG, the rate is a little slower.  PVCs are now present.      Will check electrolytes, as well as a TSH.  In addition, with      today's labs, I will check a hemoglobin A1c and uric acid (he      complains of some gout-like symptoms).  Question if he should have      a Holter monitor.  His rates again may be running a little slow for      him.  2. Hypertension.  Again, will need to review.  3. Coronary artery disease appears to be stable.  I would continue on      current regimen  4. Dyslipidemia.  Continue only to check on fasting labs.  5. History of numbness in lateral thighs, seen by Ricarda Frame, M.D.      He has followed up.  Had an EMG  and MRI done.  Has not had      significant stenosis.  It sounds like a focal peripheral pinching.     Pricilla Riffle, MD, [facility]  Electronically Signed    PVR/MedQ  DD: 09/05/2007  DT: 09/06/2007  Job #: [id number]   cc:   Lucila Maine

## 2010-11-05 NOTE — Assessment & Plan Note (Signed)
[facility]ALTHCARE                            CARDIOLOGY OFFICE NOTE   RAUN, ROUTH                         MRN:          [id number]  DATE:03/17/2008                            DOB:          July 18, 1945    IDENTIFICATION:  Brandon Stone is a 65 year old gentleman.  I last saw him  in August.   At that time, I stopped his Imdur and added Norvasc to his regimen, kept  his lisinopril at 20.   In the interval, he has done okay.  He is not dizzy.  No shortness of  breath.  No chest pressure.  His blood pressure over the last several  days has been anywhere from the 130-141 over 83-91.   Current medications then include hydrochlorothiazide 12.5, metoprolol  50, allopurinol, Prilosec, aspirin, Avodart, lisinopril 20, potassium  20, Lipitor 80, Plavix 75, fish oil, potassium t.i.d.   PHYSICAL EXAMINATION:  GENERAL:  The patient is in no distress.  VITAL SIGNS:  Blood pressure 149/93, on my check 150/94, pulse is 50 and  regular, weight was 208.  LUNGS:  Clear.  CARDIAC:  Regular rate and rhythm.  S1 and S2.  No S3.  No significant  murmurs.  ABDOMEN:  Benign.  EXTREMITIES:  No edema.   ASSESSMENT AND PLAN:  1. Hypertension, high today but has been better.  Since he has been so      symptomatic with changes, I would recommend working on his diet to      see if we can get his weight down and then that may help his blood      pressure even some.  He has had problems with dizziness on higher      doses.  I will set to see him in February.  2. Coronary artery disease.  The patient is status post coronary      artery bypass graft in 1991, inferior wall myocardial infarction in      1998, cath at that time showed occlusion of vein graft to right      coronary artery, other grafts patent.  The patient underwent a redo      coronary artery bypass grafting (left radial graft to the posterior      descending artery; saphenous vein graft to obtuse marginal-1/distal     circumflex) in 2004.  He is currently asymptomatic.  He has been on      Plavix.  I think with his extensive disease and multiple      interventions redo bypass, I would keep him on Plavix.  Distal      vessels have disease.  He is asymptomatic now.  3. Dyslipidemia.  We will check a fasting lipid panel today on Lipitor      80.  Otherwise, again as noted, I will see him back in February,      sooner if problems develop.  I will be in touch with him regarding      his labs.     Pricilla Riffle, MD, [facility]  Electronically Signed   PVR/MedQ  DD: 03/17/2008  DT:  03/18/2008  Job #: [id number]   cc:   Lucila Maine, MD

## 2010-11-05 NOTE — Assessment & Plan Note (Signed)
[facility] HEALTHCARE                            CARDIOLOGY OFFICE NOTE   Brandon Stone, Brandon Stone                         MRN:          [id number]  DATE:04/12/2007                            DOB:          13-Apr-1946    IDENTIFICATION:  Brandon Stone is a patient of Dr. Geralynn Rile.  He was last  seen in October of last year.   The patient has a history of CAD.  He is status post CABG in 1991,  inferior wall MI in 1998.  Catheterization at that time showed occlusion  of the vein graft to the RCA.  Note, he had prior stent to the vein  graft to the OM, LIMA to LAD was patent.  SVG to OM was patent.  EF at  that time was 45-50%.  Echocardiogram in September of 2007 showed normal  left ventricular function with inferior hypokinesis.   Since seen, the patient said a few weeks ago he was dizzy.  His blood  pressure was up at the time.  He started taking it more and it came down  to the 140 range, and he stopped taking it.  He still notes occasional  dizziness, but no shortness of breath.  He denies chest pain.   He says he is active, though he would like to walk more.  He had some  problems with low back pain and actually had some numbness in his  lateral left leg.   The patient notes occasional right-sided chest pain, not associated with  any activity.   Last night, before coming in, the patient says he ate more salt and  explained his blood pressure increase.   CURRENT MEDICATIONS:  1. Toprol XL 100.  2. Prilosec over-the-counter.  3. Hydrochlorothiazide 12.5.  4. Gemfibrozil 600.  5. Allopurinol 300.  6. Aspirin 81.  7. Multivitamin daily.  8. Potassium 60 daily.  9. Isorbid 30.  10.Lipitor 80.  11.Plavix 75.  12.Fish oil 1.2 g.  13.Lisinopril 20.  14.Avodart 5.   PHYSICAL EXAM:  The patient is in no distress.  Blood pressure is 173/98, pulse 58 and regular, weight 209, up 6 pounds  from October of last year.  LUNGS:  Clear.  NECK:  JVP is normal.  CARDIAC:  Regular rate and rhythm.  S1, S2.  No significant murmurs.  No  S3.  ABDOMEN:  Benign.  No hepatomegaly.  EXTREMITIES:  No edema.  2+ pulses.   IMPRESSION:  1. Coronary artery disease.  As noted above, status post bypass and      has had intervention.  Left ventricular function on echo back in      October of last year was normalized.  An ACE inhibitor was added at      that time and he is tolerating.  I would continue on the current      regimen.  2. Hypertension.  Increased.  Review of his medicines, I would add      Norvasc to his regimen, and probably pull back on the Isorbid.      Note, he has an occluded  right that fills via collateral, so he      would do well with something that vasodilates.  I would like to see      him back in about a month to see how he is doing, and then taper      the Imdur if able.  3. Cardiovascular disease.  Reported intracranial disease.  Will get      carotid Dopplers.  4. Dyslipidemia.  The patient is on Lipitor and gemfibrozil by report.      No longer Zetia.  Last lipid panel back in April.  He should have a      repeat.   I will set followup again with his blood pressure regimen change for 4  to 6 weeks.     Pricilla Riffle, MD, [facility]  Electronically Signed    PVR/MedQ  DD: 04/13/2007  DT: 04/13/2007  Job #: [id number]   cc:   Lucila Maine, MD

## 2010-11-05 NOTE — Assessment & Plan Note (Signed)
[facility] HEALTHCARE                            CARDIOLOGY OFFICE NOTE   Brandon Stone, Brandon Stone                         MRN:          [id number]  DATE:10/11/2007                            DOB:          11-May-1946    IDENTIFICATION:  The patient is a 65 year old gentleman.  He has a  history of CAD (status post CABG in 1991; status post inferior wall MI  in 1998).  Last catheterization at that time showed occlusion of the  vein graft to the RCA.  LIMA to LAD was patent.  SVG to OM was patent  (previously stented).  LVEF was 45-50%.   I actually last saw him back in March.  He was complaining of occasional  dizziness, smells made things worse, felt at times like things were  coming down on me.  Note, he had been seen actually in December.  Jacolyn Reedy, one of our PA's, had changed his medications around.   My conclusion was first to pull back on some his medicines and see if  that helped his symptoms.  I knew of course that his blood pressure  would be running a little bit on the high side.  I therefore decreased  the lisinopril to 20 daily and decreased the HCTZ to 12.5.  Note, he has  called since and he is now on generic metoprolol at 50 b.i.d.   In addition, the patient was set up for Holter monitor.  This showed  heart rates of 40-80 beats per minute with an average of 58.  Longest  pause was 2.4 seconds.  This was on 100 of Toprol XL.  He was told to go  down to 50 daily of XL.   Since seen, he actually says the dizziness has resolved.  He says his  blood pressure has been in the 121-130 range systolic.  He denies chest  pain, no breathing difficulties.   CURRENT MEDICATIONS:  1. Avodart 0.5.  2. Lisinopril 20 daily.  3. Potassium t.i.d.  4. HCTZ 12.5 daily.  5. Metoprolol question 50 b.i.d.  6. Prilosec OTC.  7. Gemfibrozil 600.  8. Allopurinol 300.  9. Aspirin 81.  10.Multivitamin.  11.Isorbid 30.  12.Lipitor 80.  13.Plavix 75.  14.Fish oil  1.2 grams.   PHYSICAL EXAMINATION:  GENERAL:  The patient is in no distress.  VITAL SIGNS:  Blood pressure is 182/90, pulse is 56 and regular, weight  is 207.  LUNGS:  Clear.  CARDIAC:  Regular rate and rhythm, S1-S2, no S3, no murmurs.  ABDOMEN:  Benign.  EXTREMITIES:  No edema.   IMPRESSION:  1. Hypertension.  On my check today, blood pressure was 170/90.  I am      not sure how reliable his cuff is.  He says he is getting the 120-      130 range.  It does not seem right especially since I pulled back      on his medicines.  I told him we need to check his blood pressure      as well as his cuff.  He should  bring it in the next time he comes      in. Today, we will check a BMET and TSH.  He is on potassium      supplement.  I will need to be in touch with him and he is to call      with his blood pressures.  2. Coronary artery disease.  No evidence for active ischemia.  At this      point, would again continue to titrate blood pressure medications.  3. Dyslipidemia on Lipitor and question gemfibrozil.  We need to get      his list.   I will set to see the patient back based on his response with blood  pressure.  I will be in touch with him regarding blood work.  Note, he  had a mildly elevated hemoglobin A1c and this has been sent off to Dr.  Lorin Picket.     Pricilla Riffle, MD, [facility]  Electronically Signed    PVR/MedQ  DD: 10/12/2007  DT: 10/12/2007  Job #: [id number]

## 2010-11-05 NOTE — Group Therapy Note (Signed)
REASON FOR REFERRAL:  Numbness in the left lateral thigh.   Consult requested by Dr. Dietrich Pates.   HISTORY:  Brandon Stone is a 65 year old male who has a chief complaint of  progressive numbness over the left lateral thigh.  He had a small area  of numbness dating back over a year, but this has increased in size to  the point it covers a large part of his left lateral thigh.  He has had  some recent weight gain.  He, in addition, has had some right heel pain.  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  He continues to drive.  He works 40 hours a  week.  He has numbness and tingling in the left lateral thigh.  He has  had some on and off back pain throughout the years.  This is not  particularly problematic at this time or exacerbated.   PAST MEDICAL HISTORY:  A history of coronary artery disease.  He has had  bypass grafts in 1991 and 2004, harvest sites right saphenous and left  forearm.  He had an MI in 1998.  Past medical history also includes high  blood pressure.   PAST SURGICAL HISTORY:  Appendectomy, T&A.   SOCIAL HISTORY:  He is divorced, lives alone.   FAMILY HISTORY:  He has a family history of heart disease, diabetes, and  high blood pressure.   PHYSICAL EXAMINATION:  VITAL SIGNS:  Blood pressure 148/86, pulse 64,  respirations 18, O2 Sat 98% on room air.  GENERAL:  His orientation x3 in mood and affect, bright, and alert.  His  gait is normal.  MUSCULOSKELETAL:  LEFT LATERAL THIGH:  Has reduced sensation to pin  prick as well as to light touch over approximately 20 cm area in length  and 10 cm width.  He has good hip range of motion, knee and ankle range  of motion.  No effusions.  His femoral stretch test is negative.  His  straight leg raise test is negative.  His motor strength is full in hip  flexion, knee extension, ankle dorsiflexion, and  great toe extensor  sensory __________ .  He is able to toe walk and heel walk.  Right  Achilles insertion site, the lateral aspect is tender to palpation.  NEUROLOGICAL:  Deep tendon reflexes are normal.   IMPRESSION:  Left lateral thigh numbness.   DIFFERENTIAL:  1. Differential includes L2-3 disk herniation or foraminal stenosis      causing L2-3 radiculopathy given his history of back pain      throughout the years, this is most likely.  2. Meralgia paresthetica that is lateral femoral cutaneous nerve      entrapment.   PLAN:  1. Will check MRI of the lumbar spine and if this is nonrevealing,      would do an EMG.  2. If he does have L2-3 stenosis or evidence of HNP at that level,      would consider epidural steroid injection transforaminal route.  3. He does not really want to try any medications other than over-the-      counter at this point, but I have given him a 1 week supply of      Celebrex, which may help with his right heel Achilles tendinitis.      I have given  him some      stretches for the right ankle and if he is doing well with these,      will just progress to some strengthening exercises at next visit.      Erick Colace, M.D.  Electronically Signed     AEK/MedQ  D:  05/06/2007 16:15:18  T:  05/07/2007 12:01:31  Job #:  [id number]   cc:   Pricilla Riffle, MD, [facility]  [address]  [address]  [place]  [place] [address]

## 2010-11-05 NOTE — Assessment & Plan Note (Signed)
[facility] HEALTHCARE                            CARDIOLOGY OFFICE NOTE   ONEAL, SCHOENBERGER                         MRN:          [id number]  DATE:02/14/2008                            DOB:          December 14, 1945    IDENTIFICATION:  Mr. Desilets is a 65 year old gentleman, I last saw him  back in April.  He has a history of CAD (status post CABG in 1991;  status post inferior wall MI 1998, cath at that time showed occlusion of  the vein graft to the RCA, LIMA to LAD was patent, SVG to OM was patent  (previously stented).  The patient also has a history of hypertension  and dizziness.  In addition, he has a history of dyslipidemia.   The patient comes in today, he said actually today, he had some neck  pressure.  He points in his throat area.  This afternoon, he took his  blood pressure, he brings in the records, at about 1:20 p.m. is 170/104,  then 183/106.  He remained high in the 170s to 100s after, and at 3:10  183/112.  He says the discomfort, he claims, it is kind of like he has a  scratchy throat, has gone.  He is now complaining of some lower  abdominal gassy sensation and has had some loose bowel movements.   With his log he brings in, his pressures at home have been in the 130s  to 160s over 70s to 90s.  Majority appear to be a little high at 140s  over 80s to 90s.   CURRENT MEDICATIONS:  1. Avodart 0.5.  2. Lisinopril 20 one time per day.  3. Potassium 20 mEq t.i.d.  4. Hydrochlorothiazide 12.5.  5. Metoprolol 50.  6. Prilosec.  7. Gemfibrozil 600.  8. Allopurinol 300.  9. Aspirin 81.  10.Multivitamin.  11.Iso-Bid 30.  12.Lipitor 80.  13.Plavix 75.  14.Fish oil 1.2 grams.   PHYSICAL EXAMINATION:  GENERAL:  The patient is in no distress.  VITAL SIGNS:  Blood pressure 178/100, pulse is 70, weight 208.  LUNGS:  Clear.  CARDIAC:  Regular, rate, and rhythm.  S1 and S2.  No S3.  No murmurs.  ABDOMEN:  Benign without hepatomegaly.  EXTREMITIES:  No  edema.   An 12-lead EKG, heart rate 72 beats per minute.  Normal sinus rhythm.  Inferior wall MI.   IMPRESSION:  1. Hypertension, not optimal.  He has had some dizziness with      medicines, but I would stop the Imdur and add Norvasc 5 to his      regimen today.  He can take an additional lisinopril but I would      keep it at one time per day.  He is to call with his blood pressure      readings in about a week.  2. Chest and throat pressure/scratchiness.  I am not sure if this      represents an anginal equivalent.  I told him to watch this and      call back.  With his blood pressure readings and  how he is feeling,      continue on his medicines for now.  3. Dyslipidemia.  Records say, he is on both Lipitor and gemfibrozil.      We will need to see when his last fasting lipids were done.  He      should be off.  I would take him off the gemfibrozil.  Note, it      looks like he is due to have some, will need to reschedule.   I should be in touch with the patient when he calls.     Pricilla Riffle, MD, [facility]  Electronically Signed    PVR/MedQ  DD: 02/14/2008  DT: 02/15/2008  Job #: [id number]

## 2010-11-05 NOTE — Assessment & Plan Note (Signed)
CONSULTING PHYSICIAN:  Dr. Dietrich Pates   CHIEF COMPLAINT:  This is a 65 year old male with a chief complaint of  progressive numbness of the left lateral thigh. He has a small area of  numbness dating back over a year, but it increased in size. It covers a  large part of his lateral left thigh. He has had some recent weight  gain. 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 He has no leg weakness. He  has had some on and off back pain throughout the years.   He had an MRI at [facility] demonstrating no abnormalities in  T12, L1, L1, L2, L3, levels which would be most correlated with his  findings. He did have some milddisc desiccation at L3-4, but otherwise  no compressive lesions. He did have a disc extrusion with coddle  migration, but no spinal stenosis. He had some moderate facet  hypertrophy at the level. 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 Once again, not  correlating well with his symptomatology.   Of note is that he did have large bilateral cystic renal lesions.  Recommendations for a contrast enhanced renal CT versus MRI or renal  ultrasound. He had a large left renal hylan cyst that could represent a  peripelvic cyst.   The patient did note that he has been told that he had kidney stones  in the lower poles of his kidneys by his primary care physician. We will  fax the report to Dr. Lorin Picket at [facility].   PHYSICAL EXAMINATION:  VITAL SIGNS:  Blood pressure 144/78, pulse 63, O2  saturation 97% on room air.  GENERAL:  No acute distress. Mood and affect appropriate.  BACK:  No tenderness to palpation. He has good forward flexion and  extension.  MOTOR:  Normal hip flexion and hip adduction, as well as hip abduction,  and normal knee extension, ankle, and dorsi flexion. Sensation is  reduced in the left lateral thigh only. Gait is without toe drag or knee  instability.   IMPRESSION:  Left  lateral thigh numbness without pain. No evidence of a  L2-3 radiculopathy. 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 I am  uncertain whether or not the renal lesions may be explaining any of his  symptomatology. I do think that he needs an EMG and probably a urology  evaluation. I will schedule him for the EMG and discuss with primary in  terms of seeing if this has been already worked up.      Erick Colace, M.D.  Electronically Signed     AEK/MedQ  D:  06/03/2007 14:09:35  T:  06/04/2007 02:41:09  Job #:  [id number]   cc:   Pricilla Riffle, MD, [facility]  [address]  [address]  [place]  [place] [address]

## 2010-11-05 NOTE — Assessment & Plan Note (Signed)
[facility]                            CARDIOLOGY OFFICE NOTE   Brandon Stone, Brandon Stone                         MRN:          [id number]  DATE:08/11/2008                            DOB:          Oct 20, 1945    Brandon Stone is a 65 year old gentleman who I follow in clinic.  He was  last seen back in September of last year.  He has a history of  hypertension, CAD, and dyslipidemia.   Since seen he has been doing okay.  He did keep his blood pressures at  home.  He brings in a partial log today.  There was a time in July 07, 2008, where he had significant elevation pressure is in the 170/100,  it came down over the next several days.  By August 12, 2008, it was  132/78.  Here his latest blood pressure recordings of the past few days  have been 120-130/70s-80s.  He is not sure how to explain this.  He said  he was taking his medicines.   Otherwise, he denies chest pain.  His breathing has been okay.   CURRENT MEDICINES:  1. Avodart 0.5.  2. Lisinopril 20.  3. Potassium 20 t.i.d.  4. HCTZ 12.5.  5. Metoprolol 50 daily.  6. Vytorin 10/40.  7. Norvasc 5 b.i.d.  8. Prilosec 20.  9. Allopurinol 300.  10.Aspirin 81.  11.Multivitamin.  12.Plavix 75.  13.Fish oil.   PHYSICAL EXAMINATION:  GENERAL:  The patient is in no distress.  VITAL SIGNS:  Blood pressure is 142/85, pulse is 57 and regular, weight  212.  NECK:  JVP is normal.  No bruits.  LUNGS:  Clear.  No rales.  CARDIAC:  Regular rate and rhythm.  S1 and S2.  No S3.  No murmurs.  ABDOMEN:  Benign.  No hepatomegaly.  EXTREMITIES:  No edema.   IMPRESSION:  1. Hypertension, better today.  I am not sure how to explain that      blood with a higher pressures.  Would continue to follow.  2. Coronary artery disease, coronary artery bypass graft in 1991.      Inferior wall myocardial infarction in 1998, redo coronary artery      bypass graft in 2004 (left radial artery to PDA; SVG to OM1/distal   circumflex).  He has extensive disease and multiple interventions      with redo bypass.  I have kept him on Plavix therapy.  Clinically      doing well.  3. Dyslipidemia - last lipid panel was in the July 13, 2008, LDL      was 80 with 1233 particles, and HDL was 33.  He needs to improve on      this and this will need to be followed closely.  I have encouraged      him to increase his activity levels.  He is now on Vytorin 10/40.      We will arrange followup for about 4 months.  4. BMET done at the time of the lipids.  Potassium was 3.8.   I will set  followup for the fall.  Again, encouraged him to increase his  activity.  This should help with his several medical problems.     Pricilla Riffle, MD, [facility]  Electronically Signed    PVR/MedQ  DD: 08/12/2008  DT: 08/13/2008  Job #: [id number]   cc:   Lucila Maine, MD

## 2010-11-08 NOTE — H&P (Signed)
NAMEFUTURE, YELDELL NO.:  [id number]   MEDICAL RECORD NO.:  [id number]          PATIENT TYPE:  EMS   LOCATION:  MAJO                         FACILITY:  [facility]   PHYSICIAN:  Vida Roller, M.D.   DATE OF BIRTH:  1946/02/10   DATE OF ADMISSION:  12/30/2004  DATE OF DISCHARGE:                                HISTORY & PHYSICAL   PRIMARY CARE PHYSICIAN:  Dr. Lorin Picket.   PRIMARY CARDIOLOGIST:  Dr. Sylvie Farrier in [place], [place].   CHIEF COMPLAINT:  Chest pain and palpitations.   HISTORY OF PRESENT ILLNESS:  Mr. Peters is a 65 year old male with a history  of coronary artery disease.  He began having upper abdominal/subxiphoid  discomfort and palpitations two days ago.  They lasted most of the day on  Saturday and Sunday.  He actually noted a decrease in the palpitations with  exercise, but then they resumed after he rested.  The symptoms woke him at  3:30 this morning, and he called EMS.  He stated that his arms and legs felt  weak, which is a new symptom.  EMS was called, and he was transported to the  hospital.  He took his morning medications, including Toprol XL 50 mg and  Cartia XT 240 mg, at approximately 5:15 a.m.  Per EMS strips, there are some  PVCs, and more frequent ventricular ectopy was described but not available  for review.  He is symptom-free at the time of exam.   PAST MEDICAL HISTORY:  1.  Status post bypass surgery in 1991 with LIMA to LAD and diagonal SVG to      OM and SVG to RCA.  SVG to RCA OM and SVG to RCA total left cath.  2.  Status post redo bypass surgery in 2004 with SVG to OM and then to the      circumflex as well as a left radial to the PDA.  (LIMA to LAD patent).  3.  Mild left ventricular dysfunction with an EF of 50% at cath of 2004.  4.  Status post stent to the left main in 2003, stent to the RCA in 1998 and      stent to the vein graft of the SVG to OM x 2 in 2003.  5.  Postoperative atrial fibrillation in 2004.  6.   Hypertension.  7.  Hyperlipidemia.  8.  History of gout.  9.  History of gastroesophageal reflux disease symptoms.  10. History of umbilical hernia.  11. Family history of coronary artery disease.   PAST SURGICAL HISTORY:  1.  Multiple cardiac catheterizations as well as the aorta bypass surgery x      2.  2.  Tonsillectomy.  3.  Hernia repair x 2.  4.  Appendectomy.   SOCIAL HISTORY:  He lives in [place] alone and works as a Chartered certified accountant.  He  quit tobacco and alcohol more than 30 years ago.   FAMILY HISTORY:  His mother is alive at age 69 and had bypass surgery in her  76s.  His father died at age 72 and had bypass  surgery in his 70s.  There is  no heart disease in his siblings.   ALLERGIES:  He is intolerant to CODEINE with nightmares and allergic to  TICLID with a rash.  He also stated that he had problems with some of the  pain medications he was given during his last bypass surgery but is not sure  what they are.   REVIEW OF SYSTEMS:  Significant for chest discomfort and palpitations as  described above.  He denies any dyspnea on exertion and exercises fairly  regularly.  He has no recent fevers, chills or other illnesses.  He gets  occasional leg cramps and foot cramps at night.  He also has some dizziness,  which he states is mainly in the morning upon first arising, and does not  feel that he is presyncopal.  He says it is very mild and resolves  spontaneously.  He also has occasional reflux symptoms despite being on  medications.  Review of systems is otherwise negative.   PHYSICAL EXAMINATION:  VITAL SIGNS:  He is afebrile.  Blood pressure 141/87,  heart rate 64, respiratory rate 16, O2 saturation 99% on room air.  GENERAL:  He is a well-developed, well-nourished white male in no acute  distress.  HEENT:  His head is normocephalic and atraumatic with pupils equal, round  and reactive to light and accommodation.  Extraocular movements are intact.  Sclerae are clear.   Nose without discharge.  NECK:  There is no lymphadenopathy, thyromegaly, bruit or JVD noted.  CARDIOVASCULAR:  His heart is regular in rate and rhythm with an S1 and S2.  No significant murmur, rub or gallop is noted.  LUNGS:  Clear to auscultation bilaterally.  SKIN:  His scars from bypass surgery on the left upper extremity, chest and  right lower extremity are well healed.  ABDOMEN:  Soft and slightly tender over the recent surgical area.  There is  no hepatosplenomegaly noted.  EXTREMITIES:  There is no cyanosis, clubbing or edema.  MUSCULOSKELETAL:  There is no joint deformity or effusions and no spinous or  CVA tenderness.  NEURO:  He is alert and oriented.  Cranial nerves II-XII are grossly intact.   The EKG is sinus bradycardia, rate 59, with inferior T wave changes that are  the same as an EKG dated 2004.   Laboratory values are pending.   ASSESSMENT/PLAN:  1.  Palpitations:  He has a history of post-op atrial fibrillation as well      as ventricular arrhythmia seen on strip review.  He will be admitted.      We will change the Toprol XL from morning to evening dose and then      continue the Cartia XT as an a.m. medication.  Rhythm will be followed      closely overnight.  2.  Chest pain:  Cycle enzymes and check an echo for his ejection fraction.      If enzymes are negative and EF is still good, he could have an      outpatient myocardial perfusion study.  But if any complex dysrhythmia      is noted or if      his EF is decreased, he needs a cath and an EP consult.  3.  The patient is otherwise stable and will be continued on his home      medications.  We will check a fasting lipid profile in a.m.       RB/MEDQ  D:  12/30/2004  T:  12/30/2004  Job:  [id number]

## 2010-11-08 NOTE — Cardiovascular Report (Signed)
[place]. [facility]  Patient:    Brandon [name], Brandon [name] Visit Number: [id number] MRN: [id number]          Service Type: MED Location: [phone] Attending Physician:  Junious Silk Dictated by:   Arturo Morton Riley Kill, M.D. [facility] Proc. Date: 12/16/01 Admit Date:  12/15/2001   CC:         Florence Canner, M.D.  Madolyn Frieze Jens Som, M.D. [facility]  Cardiac Catheterization Lab   Cardiac Catheterization  INDICATIONS:  The patient is a 65 year old who previously underwent revascularization surgery in 1991 by Dr. Particia Lather.  At that time he had an internal mammary to the diagonal and LAD, saphenous vein graft to the OM, and saphenous vein graft to the RCA.  He subsequently did well but had an acute myocardial infarction in 1998.  At that time he had a stent placed to the native RCA through the vein graft.  He recently has presented with a little bit of nausea and feelings that he said were similar to his prior infarct.  As a result, he was transferred to [facility] for further evaluation including cardiac catheterization.  PROCEDURES: 1. Right and left heart catheterization. 2. Selective coronary arteriography. 3. Selective left ventriculography. 4. Saphenous vein graft angiography x2. 5. Selective left internal mammary angiography x1.  DESCRIPTION OF PROCEDURE:  The procedure was performed from the right femoral artery using #6 French catheters.  He tolerated the procedure well without complication.  I then reviewed the films with Dr. Veneda Melter in the laboratory.  He was taken to the holding area in satisfactory clinical condition where direct hemostasis was applied.  HEMODYNAMIC DATA: 1. Central aorta 153/93. 2. Left ventricle 150/23. 3. No aortic to left ventricular gradient on pullback across the aortic valve.  ANGIOGRAPHIC DATA: 1. Ventriculography was performed in the RAO projection.  There was inferior    hypokinesis.  Ejection fraction  was calculated at 48%.  There did not    appear to be significant mitral regurgitation.  2. The left main coronary artery has a 95% stenosis at its distal most    aspect.  3. The left anterior descending artery is totally occluded just after the    origin of an intermedius.  4. The AV circumflex has about a 70% and 70-80% areas of segmental narrowing.    The left main actually leads into the AV circumflex as well as the    intermedius with the native LAD totally occluded from this vessel.  5. The native right coronary artery has diffuse disease proximally with 90%    and 80% lesions, then is totally occluded.  6. The left internal mammary to the diagonal and LAD is widely patent.    Importantly, it supplies a diagonal which supplies the mid LAD section and    the distal portion of the graft supplies the apical portion of the LAD.    These are widely patent and this provides collateral vessels to the distal    right circulation including both the posterolateral and posterior    descending.  The posterolateral and posterior descending do not connect in    the AV groove.  7. The saphenous vein graft to the large marginal has evidence of    deterioration with tandem 70% lesions.  8. The saphenous vein graft to the distal right coronary artery is totally    occluded.  CONCLUSIONS: 1. Preserved left ventricular function with an inferior wall motion    abnormality with  hypo but not akinesis and ejection fraction of 48%. 2. Continued patency of the internal mammary to the diagonal and left    anterior descending with extensive collateralization of the distal right    coronary circulation including the posterior descending and posterolateral    branches. 3. Deterioration in the saphenous vein graft to the large obtuse marginal    branch as described above. 4. High-grade stenosis in the left main leading into a modest sized    intermedius and modest sized AV circumflex with segmental  disease in the    circumflex itself.  DISPOSITION:  I reviewed the films with Dr. Chales Abrahams.  There is clear deterioration in the saphenous vein graft to the OM and the right graft is now occluded.  There is an extensively large distal right circulation and both branches appear to be graftable.  In addition, the vein graft to the OM is deteriorating and there is disease in the left main that involves both an intermedius and an AV circumflex, both of which potentially could be grafted with a sequential graft.  A surgical consultation will be obtained to further consider these options.  If we were to take a percutaneous route, it would require probably two stents in the saphenous vein graft to the OM and possibly stenting of the left main lying across into the AV circumflex.  This would be, at best, difficult and probably not optimal especially given the patients good overall health and age of 65.  A surgical consultation will be obtained to help [place] consider the options. Dictated by:   Arturo Morton Riley Kill, M.D. [facility] Attending Physician:  Junious Silk DD:  12/16/01 TD:  12/18/01 Job: [id number]

## 2010-11-08 NOTE — Consult Note (Signed)
[place]. [facility]  Patient:    Brandon [name], Brandon [name] Visit Number: [id number] MRN: [id number]          Service Type: MED Location: [phone] Attending Physician:  Junious Silk Dictated by:   Salvatore Decent Dorris Fetch, M.D. Proc. Date: 12/17/01 Admit Date:  12/15/2001   CC:         Arturo Morton. Riley Kill, M.D. [facility]  Dietrich Pates, M.D. [facility]  Madolyn Frieze. Jens Som, M.D. [facility]   Consultation Report  REASON FOR CONSULTATION:  Question redo coronary artery bypass grafting.  CHIEF COMPLAINT:  Left arm pain.  HISTORY OF PRESENT ILLNESS:  The patient is a 65 year old white male with a history of coronary artery bypass grafting x5 in 1991 by Dr. Particia Lather. He also had a myocardial infarction and PTCA of his right coronary in 1998. He had done well since that time, had been exercising routinely and had no return of his anginal symptoms.  On the morning of December 15, 2001, he awoke with chills, shortly thereafter he felt his heart racing and then he developed left arm pain.  This was similar to the pain he had previously with his heart attack.  He took a nitroglycerin and the pain subsided.  He was subsequently admitted to the hospital and ruled out for myocardial infarction.  He underwent cardiac catheterization yesterday which revealed severe left main and native three-vessel disease as well as severe vein graft disease, there was a large patent left internal mammary artery to diagonal and LAD graft present and his ejection fraction was approximately 48%.  MEDICATIONS AT TIME OF ADMISSION:  K-Dur, sublingual nitroglycerin p.r.n., omeprazole, Toprol, Norvasc, allopurinol, aspirin, Lopid, Enalapril, hydrochlorothiazide and Lipitor.  He currently is on IV nitroglycerin and heparin.  ALLERGIES:  TICLID and CODEINE.  PAST MEDICAL HISTORY:  Hypertension, hyperlipidemia, coronary artery disease as stated in the HPI.  He denies diabetes mellitus.  He has had  an appendectomy, tonsillectomy, gout, and gastroesophageal reflux disease.  SOCIAL HISTORY:  He does not smoke or drink.  He maintains an active lifestyle.  FAMILY HISTORY:  Both his mother and father have had coronary disease.  REVIEW OF SYSTEMS:  He had chills the morning of admission but has not had any cough, fevers, or any other subsequent chills or sweats.  There has been no change in bowel or bladder habits.  He has had no orthopnea, PND, or pedal edema.  No history of DVT.  He has no history of abnormal bleeding or clotting.  All other systems are negative.  PHYSICAL EXAMINATION:  The patient is a well-appearing 65 year old white male in no acute distress.  In general, he is well developed and well nourished. Vital signs: Blood pressure is 153/93, pulse is 85 and regular, respirations are 16, he is afebrile.  His skin is warm, pink, and dry.  HEENT within normal limits.  Neurologically, he is alert and oriented x3 and grossly intact.  His neck is supple with no carotid bruits or adenopathy.  His lungs are clear to auscultation and percussion.  His cardiac exam has a regular rate and rhythm, normal S1 and S2, there is no rubs, murmurs, or gallops.  His sternal incision is intact.  The sternum is solidly healed. There are palpable wires present.  His lungs are clear to auscultation and percussion.  His abdomen is soft and nontender.  His extremities are without clubbing, cyanosis, or edema.  He has 2+ radial, dorsalis pedis, and posterior tibial pulses bilaterally.  There has been a saphenous venectomy in the right lower leg.  There is no peripheral edema.  He has normal Allens test on the left side.  LABORATORY DATA:  EKG shows sinus rhythm with a rate of 76, previous inferior infarct.  Chest x-ray shows no active disease.  Hematocrit is 48, platelets are 179, white count is 11.1.  Sodium 140, potassium 3.4, BUN and creatinine are 17 and 1.1.  His cardiac enzymes were  negative for MI.  IMPRESSION:  The patient is a 65 year old gentleman, he has a history of premature coronary disease, he had coronary artery bypass grafting x5 by Dr. Andrey Campanile in 1991, he then did well for 7 years before having a myocardial infarction requiring angioplasty of his right coronary in 1998.  He subsequently had once again done well until now when he presents with unstable angina.  Cardiac catheterization: He has got left main and severe three-vessel disease as well as vein graft occlusive disease.  He has a large left internal mammary artery to the diagonal and LAD which is widely patent and this supplies a good portion of his heart via collaterals.  There is moderate disease in a vein graft supplying a large OM branch.  This is a very difficult situation clinically from the standpoint of target vessels and the patients ability to undergo surgery.  There is certainly no question that redo bypass grafting is feasible.  The biggest concern is that with his patent mammary which is a huge vessel supplying the anterior wall as well as much of the heart via collaterals that if the mammary artery is injured during dissection he will be worse off after the surgery than he is now.  There would be probably a significant diminished amount of his short- and long-term survival as no other graft will be as effective in supplying that anterior wall.  There is no guarantee of course that an injury would occur but it is a distinct possibility with any redo procedure, particularly given the size of that vessel.  On the other hand his anatomy is not very favorable for percutaneous intervention, however, I think consideration could be given to left main angioplasty although it is not an ideal lesion.  If the cardiologists believe that that could be safely done with the protection of the mammary to the LAD then that might be a reasonable alternative.  These  issues as well as the risks of redo  grafting such as increased risk of stroke, MI and death as well as bleeding complications and infections were discussed with the patient.  He will think over his options and discuss further with a cardiologist.  I will also discuss with the cardiologists our options.  If we decide to proceed with redo bypass grafting, the first available OR date is Tuesday and he would be scheduled for that. Dictated by:   Salvatore Decent Dorris Fetch, M.D. Attending Physician:  Junious Silk DD:  12/17/01 TD:  12/20/01 Job: [id number]

## 2010-11-08 NOTE — Assessment & Plan Note (Signed)
[facility]                              CARDIOLOGY OFFICE NOTE   Brandon [name], Brandon [name]                         MRN:          [id number]  DATE:03/09/2006                            DOB:          23-Sep-1945    HISTORY OF PRESENT ILLNESS:  Brandon [name] is a 65 year old gentleman with  longstanding coronary artery disease for which he is status post coronary  artery bypass grafting in 1991 and inferior myocardial infarction in 1998.  Vein graft to the right is occluded.  He has had prior stenting of the vein  graft to the obtuse marginal.  LIMA to LAD is patent, as well as the graft  to the marginal.  Ejection fraction previously documented 45-50%.   I met Brandon [name] one month ago when he transferred care.  We switched him off  calcium channel blocker onto Toprol-XL 100 mg per day.  Brandon [name] had  forgotten to inform me that he had already been taking Toprol-XL 50 mg per  day then.  Since this change, he feels that he has been more fatigued than  previous.  He has also felt some exertional dyspnea over the past month,  which he had not been feeling previously.  He has not, however, had any  angina, paroxysmal nocturnal dyspnea, orthopnea, edema, syncope, presyncope,  or palpitations.   I discussed with Brandon [name] raising his Altace today to establish better  control of his blood pressure.  However, he recounts a story whereby when he  was on Altace 10 mg per day he had several episodes of left arm and leg  weakness occurring while taking a shower.  He did not have any symptoms when  he was not taking a shower, and he is able to take a shower without symptoms  when he is taking Altace 5 mg per day.   CURRENT MEDICATIONS:  1. Toprol-XL 100 mg per day.  2. Prilosec OTC 20 mg per day.  3. Zetia 5 mg per day.  4. HCTZ 25 mg per day.  5. Altace 5 mg per day.  6. Gemfibrozil 600 mg per day.  7. Allopurinol 300 mg per day.  8. Enteric coated aspirin 81 mg per  day.  9. Multivitamin.  10.Potassium 60 mEq per day.  11.Saw palmetto.  12.Imdur 30 mg per day.  13.Lipitor 80 mg per day.  14.Plavix 75 mg per day.  15.Fish oil 1200 mg per day.   PHYSICAL EXAMINATION:  GENERAL:  He is generally well appearing, in no  distress.  VITAL SIGNS:  Blood pressure 140/86 on the right, and 140/88 on the left.  NECK:  He has no jugular venous distention, thyromegaly, or lymphadenopathy.  LUNGS:  Clear to auscultation.  He has a normal respiratory effort.  CARDIAC:  He has a nondisplaced point of maximal cardiac impulse.  There is  a regular rate and rhythm, without murmurs, rubs, or gallops.  Median  sternotomy is nicely healed.  ABDOMEN:  Soft, nondistended, nontender.  There is no hepatosplenomegaly and  no pulsatile midline mass or abdominal bruit.  Bowel  sounds are normal.  EXTREMITIES:  Warm, without clubbing, cyanosis, edema, or ulceration.  PULSES:  Carotid pulses 2+ bilaterally, without bruit.  Femoral pulses 2+  bilaterally.  PT pulses 2+ bilaterally.   IMPRESSION/RECOMMENDATIONS:  1. Episode of left-sided weakness while in the shower.  Will check CT      angiogram to exclude carotid stenosis, including intracranial disease.      It does sound as if his blood pressure was dropping and he was having      hypoperfusion to the right side of his brain.  I think this is      important to note before we proceed on uptitrating any of his      medications for better control of his blood pressure.  2. Ischemic cardiomyopathy.  Systolic dysfunction.  EF 45-50% in 2004.      Tolerating ACE inhibitor and beta blocker.  With his new dyspnea, will      check ejection fraction by echocardiogram.  3. Coronary disease.  Asymptomatic after CABG and prior myocardial      infarction.  Continue aspirin, beta blocker, and ACE inhibitor.  4. Hypertension.  Not well controlled today, though it was fine a month      ago.  Measurements from home are elevated.  Will make  no changes until      we rule out intracranial atherosclerosis.  5. Hypercholesterolemia.  LDL 72, and HDL 36 on Lipitor 80 and Zetia 5 mg      per day.  Will consider switch to Vytorin to save him money.                                 Salvadore Farber, MD    WED/MedQ  DD:  03/09/2006  DT:  03/10/2006  Job #:  [id number]   cc:   Lucila Maine, M.D.

## 2010-11-08 NOTE — Discharge Summary (Signed)
Brandon [name], Brandon [name]                            ACCOUNT NO.:  [id number]   MEDICAL RECORD NO.:  [id number]                   PATIENT TYPE:  INP   LOCATION:  2005                                 FACILITY:  [facility]   PHYSICIAN:  Gwenith Daily. Tyrone Sage, M.D.            DATE OF BIRTH:  1946/05/24   DATE OF ADMISSION:  08/05/2002  DATE OF DISCHARGE:  08/23/2002                                 DISCHARGE SUMMARY   DISCHARGE DIAGNOSES:  1. Known coronary artery disease, status post coronary artery bypass graft     surgery in 1991 with followup stenting of the distal right coronary     artery in 1998. Also a stent in the left main coronary artery in June     2003 and a double stent to the obtuse marginal of the circumflex in     November 2003.  2. Admitted with unstable angina.  3. Critical restenosis of bypass graft to the marginal branch of the     circumflex.  4. Atrial fibrillation on postoperative day #3, converted to sinus rhythm on     Cardizem.   SECONDARY DIAGNOSES:  1. Rheumatic fever in childhood.  2. Gastroesophageal reflux disease.  3. Umbilical hernia.  4. Strong family history of coronary artery disease in both mother and     father.  5. Hypertension.   PROCEDURES:  1. August 09, 2002, left heart catheterization, Dr. Charlies Constable. In this     procedure the left main coronary was free of disease. The left anterior     descending coronary artery was completely occluded near the origin. The     left circumflex gave rise to an intermediate branch and an AV branch and     a marginal branch. The marginal branch was totally occluded. There was     also a 90% stenosis in the proximal portion of the circumflex artery. The     circumflex artery filled the distal right coronary artery by collaterals.     The right coronary artery was completely occluded near the origin. The     saphenous vein graft to the right coronary artery was completely     occluded. This was shown in a prior  study as well. The saphenous vein     graft to the marginal branch of the circumflex had a stent in the     proximal and distal portion. The distal portion had a 95% stenosis. The     left internal mammary artery graft to the left anterior descending     coronary artery was patent and functioned normally. The ejection fraction     was estimated at 50%. There was hypokinesis and akinesis of the     inferobasal segment. A distal aortogram showed patent renal arteries, no     significant aortoiliac obstruction.  2. August 17, 2002, redo coronary artery bypass graft surgery x3, Dr.  Sheliah Plane. In this procedure a reverse  sequential saphenous vein     graft was fashioned from the aorta to the first obtuse marginal and then     to the distal circumflex. The left radial artery was fashioned from the     aorta to the posterior descending coronary. The patient tolerated the     procedure well and was transferred in stable and satisfactory condition     to the recovery room.   DISPOSITION:  The patient is ready for discharge on postoperative day #6. He  was extubated on postoperative day #1. His Imdur to prevent radial artery  spasm was started on postoperative day #1 as well. He was maintained on low  dose oxygen throughout his postoperative course, but was discharged on  postoperative day #6 without oxygen support. He did not have any significant  respiratory compromise in the postoperative period.   As mentioned above he did have atrial fibrillation on postoperative day #3.  This was a brief episode, since he converted readily to sinus rhythm on a  Cardizem drip which was later changed to oral Cardizem, and he maintained  sinus rhythm throughout the rest of his hospital course.   His mental status in the postoperative period was clear. His incisions were  healing very nicely at the time of discharge. He was chest pain free except  for incisional pain and went home with the  following medications.   DISCHARGE MEDICATIONS:  1. Tylenol 325 mg 1 to  2 tablets q.4-6h. p.r.n. pain.  2. Plavix 75 mg daily.  3. Isosorbide (Imdur 30 mg daily).  4. Toprol XL 25 mg daily.  5. Nexium 40 mg daily.  6. Folic acid 1 mg daily.  7. Allopurinol 300 mg daily.  8. Lasix 40 mg daily x7 days.  9. Potassium chloride 20 mEq daily x7 days.  10.      He was then to switch to hydrochlorothiazide which he takes 1/2     tablet daily after the Lasix prescription runs out.  11.      Resume his home Lipitor.  12.      Altace 2.5 mg daily.  13.      Gemfibrozil 600 mg daily.  14.      Cardizem CD 240 mg daily.  15.      Enteric coated aspirin 81 mg daily.   DISCHARGE INSTRUCTIONS:  He is to avoid driving until he sees Dr. Tyrone Sage  in the office. He is also to avoid lifting more than 10 pounds. He will  avoid twisting or pushing for the next 6 weeks. He may shower on discharge.  His wounds are sealed nicely. His discharge diet is a low sodium, low  cholesterol diet.   FOLLOW UP:  He will have a followup with Dr. Sylvie Farrier in 2 weeks after  discharge, and he is to bring the chest x-ray taken at Dr. Ivin Poot office  when he sees Dr. Tyrone Sage. Dr. Dennie Maizes appointment is arranged for 3  weeks and his office will call to arrange that appointment.   HISTORY OF PRESENT ILLNESS:  The patient is a 65 year old male who has had  previous bypass surgery in 1991. He has also had stents placed subsequently  to the proximal and distal portions of the vein graft to the marginal  branch, also a stent placed to the left main coronary artery and stents to  the distal right coronary artery. He had documented occlusion of the vein  graft  to the right coronary. Recently he developed chest pain and was  admitted to [facility] with unstable angina. His enzymes were  negative and he was scheduled for a left heart catheterization.  HOSPITAL COURSE:  After admission to [facility] on   August 05, 2002, with unstable angina, he was placed on heparin and nitroglycerin  drips. When his chest pain increased and returned on hospital day #3 he was  also added Integrilin. He also had bout of diarrhea which resolved just  prior to  the date of catheterization which was August 09, 2002; the  results of this study have been dictated above.   It was thought that he would be a candidate for revascularization surgery  and he was seen preoperatively by Dr. Tyrone Sage who described the risks and  benefits of the surgery. The patient elected to undergo the surgery. This  was performed on August 17, 2002. Three bypasses were placed.   The patient did well after surgery and was ready for discharge on  postoperative day #6. He did have 1 episode of atrial fibrillation which  converted easily to sinus rhythm on Cardizem. He goes home with the  medications and followup as dictated above, condition improved.     Maple Mirza, P.A.                    Gwenith Daily Tyrone Sage, M.D.    GM/MEDQ  D:  09/19/2002  T:  09/20/2002  Job:  [id number]   cc:   Gwenith Daily. Tyrone Sage, M.D.  [address]  [place]  [place] [address]  Fax: [phone]   [facility]  [address]  [place]  [place] [address]  Fax: [phone]   R. Lorin Picket, M.D.

## 2010-11-08 NOTE — Cardiovascular Report (Signed)
[place]  Patient:    RIEL, HIRSCHMAN Visit Number: [id number] MRN: [id number]          Service Type: MED Location: [phone] Attending Physician:  Junious Silk Dictated by:   Daisey Must, M.D. [facility] Proc. Date: 12/21/01 Admit Date:  12/15/2001   CC:         Heide Guile, M.D., [place]  Dr. Lorin Picket, [place]. Jens Som, M.D. [facility]  Cardiac Catheterization Laboratory   Cardiac Catheterization  PROCEDURE PERFORMED: Percutaneous transluminal coronary angioplasty with stent placement in the distal left main coronary artery.  INDICATIONS: The patient is a 65 year old male with a history of previous coronary bypass surgery. He presented to the hospital with symptoms of unstable angina. Cardiac catheterization performed on December 16, 2001, by Dr. Riley Kill revealed 95% stenosis in the distal left main as well as moderate diffuse disease in the proximal circumflex. The left main supplied a ramus intermediate and the circumflex supplying a small obtuse marginal branch. The LAD itself was occluded, however, it fills via left internal mammary graft. There is patent saphenous vein graft to a large obtuse marginal branch. The vein graft to the distal right coronary artery is 100% occluded. The patient was evaluated by Dr. Dorris Fetch for possibility of re-do coronary artery bypass surgery. It was felt that this might not be his best option if percutaneous intervention of the left main artery could be performed. After further discussion and review we opted to proceed with percutaneous coronary intervention of the native left main coronary artery.  DESCRIPTION OF PROCEDURE: We initially placed a 6 French sheath in the right femoral artery. Angiomax was administered per protocol. We initially used a 6 [place] guiding catheter and a BMW wire. The wire was advanced beyond the stenosis in the left main and positioned in the distal portion  of the ramus intermediate. We then attempted to cross the lesion with a 2.5 x 12 mm Quantum balloon, however, this would not cross due to the hard fibrotic nature and severity of the stenosis. We then attempted a 2.0 x 9 mm Maverick balloon and we were able to cross the lesion with this balloon. We performed two inflations, the first one to 8 and the second one to 15 atmospheres. We then reattempted to cross with a 2.5 x 12 mm Quantum balloon, however, this would not cross.  We tried again with a 2.25 x 12 mm Quantum balloon, which also would not cross. We then utilized a 2.0 x 15 mm CrossSail balloon and were able to cross the lesion. We performed two inflations with this balloon, each to 18 atmospheres. We then went back with a 2.5 x 12 mm Quantum balloon and this time were able to cross the lesion and inflated the balloon to 18 atmospheres. Following this, we attempted to deploy a 2.75 x 8 mm Express II stent.  However, this would not cross the lesion. We advanced a mailman wire across. side the BMW wire for extra support and attempted to cross the stent over this wire. However, again this was unsuccessful and at that point we lost wire position and guide support. We therefore removed the 6 [place] guiding catheter and exchanged the sheath in the right femoral artery for a 7 French sheath. We used a 7 [place] Voda left 3.5 guiding catheter. We readvanced the BMW wire beyond the stenosis into the distal portion of the ramus intermediate. We attempted again to cross the lesion with a  2.75 x 8 mm Express II stent. However, this would not cross. We then went back in with a 2.5 x 12 mm Quantum balloon and inflated this to 20 atmospheres. Following this, we advanced a 2.75 x 8 mL Zeta stent and were able to cross the lesion with this stent. This stent was positioned such the distal aspect of the stent was just at the bifurcation of the left main covering the distal extent of the lesion. This  stent was deployed at 16 atmospheres. Final angiographic images were then obtained revealing patency of the left main coronary artery with less than 10% residual stenosis and TIMI-3 flow. The flow was maintained in both the ramus intermediate and in the left circumflex coronary artery.  COMPLICATIONS: None.  RESULTS: Difficult but successful percutaneous transluminal coronary angioplasty with stent placement in the distal left main coronary artery. A 99% stenosis was reduced to less than 10% residual with TIMI-3 flow.  PLAN: Angiomax will be discontinued at this point. It is recommended Plavix be administered for a minimum of four weeks. Dictated by:   Daisey Must, M.D. [facility] Attending Physician:  Junious Silk DD:  12/21/01 TD:  12/22/01 Job: [id number]

## 2010-11-08 NOTE — Assessment & Plan Note (Signed)
[facility]                              CARDIOLOGY OFFICE NOTE   KRATOS, RUSCITTI                         MRN:          [id number]  DATE:04/03/2006                            DOB:          1945/09/28    PRIMARY CARE PHYSICIAN:  Dr. Lucila Maine.   HISTORY OF PRESENT ILLNESS:  Brandon [name] is a 65 year old gentleman with  longstanding coronary disease for which he is status post coronary artery  bypass grafting in 1991 and inferior myocardial infarction in 1998. The vein  graft to the right is occluded. He has had prior stenting of the vein graft  to the obtuse marginal. The LIMA to the LAD is patent as is the graft to the  marginal. EF was previously 45-50%. However, echocardiogram done last month  demonstrates normalization of ventricular systolic function with mild to  moderate hypokinesis of the inferior and posterior walls.   Mr. Noe is doing well without chest pain, exertional dyspnea, PND,  orthopnea or edema. When I last saw him, he recounted a story of repetitive  episodes of left arm and leg weakness while taking a shower while also on  Altace 10 mg per day. He did not have any symptoms and he was not taking a  shower and he has been able to take a shower without symptoms while taking  Altace 5 mg per day. That lead [place] to pursue CT angiogram. This demonstrated  a 50-75% calcified stenosis in the supraclinoid portion of the right ICA. He  has an asymptomatic remote periventricular lacunar infarct in the deep  matter of the left hemisphere.   CURRENT MEDICATIONS:  1. Toprol XL 100 mg per day.  2. Altace 5 mg per day.  3. HCTZ 12.5 per day.  4. Enteric coated aspirin 81 mg per day.  5. Prilosec 20 mg per day.  6. Zetia 5 mg per day.  7. Gemfibrozil 600 mg per day.  8. Allopurinol 300 mg per day.  9. Multivitamin.  10.Potassium 60 mEq per day.  11.Saw Palmetto 400 mg twice per day.  12.Imdur 30 mg per day.  13.Lipitor 80 mg per  day.  14.Plavix 75 mg per day.  15.Fish oil 1200 mg per day.   ALLERGIES:  TICLID and CODEINE.   PHYSICAL EXAMINATION:  GENERAL:  He is generally well-appearing in no  distress.  VITAL SIGNS:  Heart rate 76, blood pressure 132/80, blood pressure at home  has ranged from 110/64 to 150/93, most are in the 130-145 range  systolically.  HEENT:  He has jugular venous distention, thyromegaly or lymphadenopathy.  LUNGS:  Clear to auscultation. Respiratory effort is normal.  CARDIAC:  He has a nondisplaced point of maximal cardiac impulse. There is a  regular rate and rhythm without murmurs, rubs or gallops. Median sternotomy  is nicely healed.  ABDOMEN:  Soft, nondistended, nontender. No hepatosplenomegaly. Bowel sounds  normal. No pulsatile mass.  EXTREMITIES:  Warm without clubbing, cyanosis, edema, or ulceration.  PULSES:  Carotid pulses 2+ bilaterally without bruits.  NEUROLOGIC:  Normal.   IMPRESSION/RECOMMENDATION:  1. Ischemic  cardiomyopathy:  Systolic function has improved to normal.      Will increase Altace to 10 mg per day. Continue beta blocker at present      dose. Dyspnea resolved.  2. Intracranial stenosis:  Will need to watch for recurrent symptom with      increased ACE inhibitor and then conservative management.  3. Coronary disease:  Asymptomatic after coronary artery bypass graft and      prior myocardial infarction. Continue aspirin, beta blocker and ACE      inhibitor.  4. Hypertension. Blood pressure not optimal. Will increase ACE inhibitor.  5. Hypercholesterolemia. Continue Lipitor and Zetia. Will consider switch      to Vytorin to save him money.       Salvadore Farber, MD     WED/MedQ  DD:  04/03/2006  DT:  04/06/2006  Job #:  [id number]   cc:   Lucila Maine, MD

## 2010-11-08 NOTE — H&P (Signed)
NAME:  Brandon [name], Brandon [name] NO.:  [id number]   MEDICAL RECORD NO.:  [id number]                   PATIENT TYPE:   LOCATION:                                       FACILITY:   PHYSICIAN:  Pricilla Riffle, M.D. [facility]             DATE OF BIRTH:  1945/08/13   DATE OF ADMISSION:  05/04/2002  DATE OF DISCHARGE:                                HISTORY & PHYSICAL   IDENTIFICATION:  The patient is a 65 year old gentleman who is followed by  Dr. Sylvie Farrier in [place].  He has a longstanding history of coronary artery  disease and is presenting on transfer for evaluation of unstable angina.   HISTORY OF PRESENT ILLNESS:  The patient's coronary history dates back to  the early 90s.  He was found to have three vessel disease on cardiac  catheterization (90% left main, 50% RCA, 50% distal RCA).  He underwent CABG  after this (LIMA to LAD and diagonal; SVG to OM1, SVG to RCA).  The patient  did well until he had an MI back in 1998.  Cardiac catheterization showed  occlusion of the LAD proximally.  First OM branch was occluded at the  ostium.  Mid AV circumflex had a 70% lesion.  His RCA was occluded in the  mid portion and received left-to-right collaterals.  Vein graft to the right  was occluded distally.  Vein graft to the margin was patent and LIMA to LAD  and diagonal was patent.  EF at the time was 40% and he underwent  angioplasty of the distal RCA with stent placement.   In June 2003, he had a repeat cardiac catheterization because of increasing  chest pain.  Left main had a 95% stenosis, LAD was occluded, AV circumflex  had a 70 to 80% narrowing.  The left main lead into the AV circumflex as  well as into the intermediate.  RCA had diffuse disease proximally with an  80 to 90% lesion and then total occlusion.  LIMA to diagonal LAD was patent.  SVG to marginal had tandem 70% lesions and SVG to RCA was occluded.  The  patient underwent dilatation/stent (question left main).   In addition, he  had dilatation stent placement of SVG to OM (report not available).   The patient was doing well.  He actually reports being able to do his  exercise video which is mild aerobics without a problem; however, over the  past week despite being able to do his exercise, he has noted some left-  sided chest pressure and radiated left arm/hand pain with minor activity.  He says when he gets out of the car from lunch he may be carrying his lunch  back and the pressure will start, he will sit down and the pain will ease  off.  He denies any associated shortness of breath with this.   Yesterday morning, he woke up  with an increasing heart rate and chest  pressure radiated to the left thumb, less than five minutes, returned to  sleep and on awakening, the pain had gone but the increased heart rate had  continued.  When he went to the mailbox to get the mail, he developed chest  pressure, arm pain again and back to the house and it eased off on its own.  He presented to Dr. Sylvie Farrier for further evaluation and was admitted.   The patient ruled out for myocardial infarction and has been pain-free.  He  was transferred for cardiac catheterization.   ALLERGIES:  TICLID leading to rash.   MEDICATIONS ON TRANSFER:  1. Vasotec 20 b.i.d.  2. Norvasc 5 q.d.  3. Lopid 600 q.d.  4. Toprol XL 100 q.d.  5. Hydrochlorothiazide 12.5 q.d.  6. Allopurinol 300 q.d.  7. Nexium 40 q.d.  8. Potassium 50 mEq b.i.d.  9. Plavix 75 q.h.s.  10.      Saw palmetto 400 b.i.d.  11.      Lipitor 50 q.h.s.  12.      Aspirin 325 q.d.  13.      Nitroglycerin p.r.n.   PAST MEDICAL HISTORY:  1. Coronary artery disease.  2. Hypertension.  3. Dyslipidemia but checked last month, okay.  4. Hiatal hernia.  5. Rheumatic fever.   PAST SURGICAL HISTORY:  1. Status post CABG.  2. Status post appendectomy.  3. Status post nasal surgery.  4. Status post T&A.   SOCIAL HISTORY:  The patient is divorced.  [place]  He quit tobacco 30 years ago.  He quit alcohol 30 years ago.   FAMILY HISTORY:  Father with coronary artery disease in his early 65s, alive  at 21.  Mother died at age 55, had CABG in her 47s.   REVIEW OF SYSTEMS:  HEENT:  The patient wears glasses.  He has a headache  with nitroglycerin.  CARDIAC:  As noted.  GI:  Negative.  GU:  Negative.  MUSCULOSKELETAL:  Negative.  ENDOCRINE:  No history of diabetes.   PHYSICAL EXAMINATION:  GENERAL APPEARANCE:  The patient is in no acute  distress.  VITAL SIGNS:  Blood pressure is 145/92, pulse 59, temperature 96.8, O2  saturation on room air 99%.  NECK:  JVP is normal.  LUNGS:  Clear, no wheezes.  CARDIOVASCULAR:  Regular rate and rhythm, normal S1 and S2.  No S3, S4 or  significant murmurs.  ABDOMEN:  Benign.  EXTREMITIES:  There are 2+ distal pulses.  No edema.   A 12-lead EKG shows sinus bradycardia at a rate of 45 beats per minute with  occasional PACs.  No ST changes to suggest ischemia.   LABORATORY DATA:  Labs from other hospital includes hemoglobin of 16.4, wbc  6.4.  BUN and creatinine of 10 and 0.8.  Sodium and potassium of 142 and  3.7.  CK-MB and troponin are negative x3.   IMPRESSION:  The patient is a fortunate 65 year old gentleman with severe  coronary artery disease.  The symptoms are worrisome for worsening ischemia.  Little unusual is his ability to do the exercise video though I am  not sure what rate he does these at or how strenuous they are.  I agree with  Dr. Sylvie Farrier and plan for cardiac catheterization to redefine his anatomy.  Will plan on this in the a.m.  Continue on current regimen if his pain  recurs, will add 2B3 inhibitor  to his regimen.                                                Pricilla Riffle, M.D. [facility]    PVR/MEDQ  D:  05/04/2002  T:  05/04/2002  Job:  [id number]

## 2010-11-08 NOTE — Discharge Summary (Signed)
Brandon [name], Brandon [name]                            ACCOUNT NO.:  [id number]   MEDICAL RECORD NO.:  [id number]                   PATIENT TYPE:  INP   LOCATION:  3743                                 FACILITY:  [facility]   PHYSICIAN:  Charlton Haws, M.D. [facility]              DATE OF BIRTH:  12-Mar-1946   DATE OF ADMISSION:  05/23/2002  DATE OF DISCHARGE:  05/24/2002                           DISCHARGE SUMMARY - REFERRING   PROCEDURES:  1. Cardiac catheterization.  2. Coronary arteriogram.  3. Graft angiogram.  4. Left ventriculogram.   HOSPITAL COURSE:  Brandon [name] is a 65 year old male with a history of coronary  artery disease who on May 06, 2002, had a cardiac catheterization and  percutaneous transluminal coronary angioplasty with three stents to the vein  grafts to the obtuse marginal.  Since that time he has done fairly well, but  four days prior to admission he began having pain in his throat and upper  chest at a 3/10.  It started with exertion at first, but gradually it  required less and less exertion to bring on the symptoms.  On the day of  admission he would receive temporary relief with nitroglycerin and aspirin,  but the pain would return.  He states that he had been put on Imdur by his  primary cardiologist in [place], but this did not help the pain and caused  a headache.  He was admitted to rule out MI and for further evaluation.   It was felt that cardiac catheterization was needed to define his anatomy  and make sure that the previously placed stents were still patent.  He  therefore had a cardiac catheterization on May 23, 2002.  The cardiac  catheterization showed a 40% left main and left anterior descending artery  that was totalled in the proximal and in the midportion.  The left internal  mammary artery to diagonal and left anterior descending artery was patent.  The ramus had a 60% ostial stenosis, and the circumflex had a 60% stenosis.  The SVG to OM was patent  with 30% ostial stenosis.  The RCA was totalled  proximally and the SVG to RCA was totalled, as it had been in prior  catheterization.  His ejection fraction was 65% with mid inferior akinesis.  The films were evaluated by Dr. Samule Ohm and Dr. Eden Emms, and it was felt that  his chest pain was unlikely to be anginal, as his anatomy had not  significantly changed since the prior catheterization.   Brandon [name] stated that he had recurrent pain on the night of the  catheterization while he was supine but that the pain decreased with upright  position and completely resolved once he ate food.  He stated that he felt  like his symptoms were possibly GI in origin, and he was advised to increase  his Nexium from 40 mg every other day to 40 mg every  day.   The next day Brandon [name]'s groin was stable, and he was having no chest pain  with ambulation.  He was considered stable for discharge on May 24, 2002  with an increase in the Nexium to 40 every day and with the Imdur  discontinued.  He is to follow-up in [place] with his cardiologist and  primary care physician.   Chest x-ray:  Cardiomegaly, no active disease.   LABORATORY VALUES:  Hemoglobin 13.7, hematocrit 38.7, WBCs 8.1, platelets  175,000.  Sodium 139, potassium 4.0, chloride 104, carbon dioxide 28, BUN  10, creatinine 0.9, glucose 108.   DISCHARGE CONDITION:  Stable.   DISCHARGE DIAGNOSES:  1. Chest pain, possibly gastrointestinal in origin.  2. Status post aortocoronary bypass surgery in 1991 with left internal     mammary artery to diagonal and left anterior descending artery, saphenous     vein graft to obtuse marginal-1, and saphenous vein graft to right     coronary artery.  3. Status post cardiac catheterization in November, 2003 with left anterior     descending artery totalled, circumflex 70%, obtuse marginal-1 totalled,     right coronary artery totalled, left internal mammary artery to left     anterior descending artery  and diagonal okay, saphenous vein graft to     right coronary artery totalled, and percutaneous transluminal coronary     angioplasty and stents x3 to the saphenous vein graft to obtuse marginal.  4. Preserve left ventricular function with an ejection fraction of 55% by     catheterization this admission.  5. Hypertension.  6. Hyperlipidemia.  7. Family history of coronary artery disease.  8. History of rheumatic fever as a child.  9. History of umbilical hernia.  10.      Status post stent to the left main in June, 2003.  11.      Gastroesophageal reflux disease symptoms.  12.      Hypokalemia.  13.      Allergy to TICLID and CODEINE.   DISCHARGE INSTRUCTIONS:  1. His activity level is to include no jogging for 24 hours, and he may     return to work on Thursday.  2. He is to stick to a low-fat diet.  3. He is to call the office for problems with the catheterization site.  4. He is to follow-up with Dr. Lorin Picket and Dr. Sylvie Farrier.   DISCHARGE MEDICATIONS:  1. Vasotec 20 mg by mouth twice a day.  2. Norvasc 5 mg every day.  3. Lopid 600 mg every day.  4. Toprol-XL 100 mg every day.  5. HCTZ 12.5 mg every day.  6. Allopurinol 300 mg every day.  7. Nexium 40 mg every day.  8. K-Dur 20 mEq, two and a half tablets twice a day.  9.     Plavix 75 mg every day.  10.      Aspirin 81 mg every day.  11.      Lipitor 40 mg, one and a half tablets by mouth every day.     Lavella Hammock, P.A. [facility]                  Charlton Haws, M.D. [facility]    RG/MEDQ  D:  05/24/2002  T:  05/24/2002  Job:  [id number]   cc:   Gilford Raid Harsh  [address]  [place]  [place] [address]  Fax: [phone]   Lorin Picket, M.D.

## 2010-11-08 NOTE — Cardiovascular Report (Signed)
Brandon [name], Brandon [name]                            ACCOUNT NO.:  [id number]   MEDICAL RECORD NO.:  [id number]                   PATIENT TYPE:  INP   LOCATION:  4731                                 FACILITY:  [facility]   PHYSICIAN:  Veneda Melter, M.D. [facility]               DATE OF BIRTH:  1945/07/26   DATE OF PROCEDURE:  05/05/2002  DATE OF DISCHARGE:                              CARDIAC CATHETERIZATION   PROCEDURES PERFORMED:  1. Left heart catheterization.  2. Left ventriculogram.  3. Selective coronary angiography.  4. Selective angiography saphenous vein bypass grafts and internal mammary     arterial bypass grafts.  5. Percutaneous transluminal coronary angioplasty and stent placement to the     mid and distal saphenous vein graft to the first marginal branch, left     circumflex artery.   DIAGNOSES:  1. Coronary artery disease.  2. 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  3. Mild left ventricular systolic dysfunction.  4. Unstable angina.   HISTORY:  The patient is a 65 year old gentleman with a known history of  coronary artery disease who has undergone surgical revascularization in 1991  with placement of a LIMA graft to the LAD, saphenous vein graft to the  marginal branch, left circumflex artery and saphenous vein graft to the  right coronary artery.  The patient recently presented in June of 2003 with  substernal chest discomfort and was found to have occlusion of the vein  graft to the right coronary artery.  He underwent percutaneous intervention  with stent placement in the distal left main trunk.  At that time he had  moderate disease of 60-70% in two locations in the saphenous vein graft in  the marginal branch.  Due to progression of symptoms that have become  unstable, the patient was admitted to the hospital stabilized medically.  He  ruled out for acute myocardial infarction.  He is referred for further  assessment.   TECHNIQUE:  Informed consent  was obtained, patient brought to the cardiac  catheterization lab, and a 6 French sheath was placed in the right femoral  artery using the modified Seldinger technique. A 6 [place] and JR4  catheters were then used to engage the left and right coronary arteries, and  selective angiography performed in various projections using manual  injections of contrast. A JR4 catheter was then used to engage the two  saphenous vein grafts and again selective angiography was performed.  A 0.4  catheter was then placed in left subclavian artery and using an exchange  wire the internal mammary catheter positioned in the left subclavian artery.  This was used to engage a LIMA graft to the LAD and selective angiography  performed using manual injections of contrast.  Finally, a 6 French pigtail  catheter was advanced to the left ventricle and left ventriculogram  performed using power injections of contrast.   FINDINGS:  1. Left main trunk:  Medication caliber vessel evidence of a previously     placed stent in the distal section which extends over the ostium of the     circumflex artery.  There is mild in-stent re-stenosis of 40-50%.  The     proximal left main trunk has mild irregularities of 30%.  2. LAD:  This is a medium caliber vessel that provides a major trifurcating     diagonal branch in the proximal segment.  The LAD is 100% occluded at its     ostium.  The diagonal branch fills via a LIMA graft and retrograde     filling of the mid LAD is noted.  The mid LAD is then 100% occluded and     reconstitutes in its distal section and the sequential continuation of     the LIMA graft.  The distal LAD has mild disease of 30% extending up to     the apex where it is then 100% occluded.  Collateral flow to the PDA is     noted.  3. Left circumflex artery:  This is a medium caliber vessel that provides     two marginal branches.  The ostium and AV circumflex have moderate     disease of 40-50%.  There  is then a eccentric narrowing of 70% in the mid     AV circumflex.  The first marginal branch is 100% occluded proximally and     fills via saphenous vein graft. It exhibits moderate disease of 30%     distal to the anastomosis.  The second marginal branch has mild     irregularities. A large collateral is noted to the posterior ventricular     branch of the right coronary artery.  4. Ramus intermedius:  This is a small caliber vessel with a proximal     narrowing of 70%.  5. Right coronary artery is dominant and the entire vessel provides the     posterior descending artery and two posterior ventricular branches in its     terminal segment.  The right coronary artery is 100% occluded proximally     and the posterior descending artery is also occluded and fills via     collateral flow from the LAD.  The two distal posterior ventricular     branches fill via collateral flow from the left circumflex artery and     have moderate diffuse disease.  6. LIMA to the LAD is patent.  This vessel extends to the large first     diagonal branch with sequential segment to the distal LAD.  There is mild     proximal disease of 30%.  7. Saphenous vein graft to the first marginal branch of the left circumflex     artery is patent.  There is moderate disease of 40% in the proximal     segment.  There is then eccentric plaque of 70% in the mid section.  The     distal vein graft has a high-grade narrowing of 95% with evidence of     thrombosis.  8. Saphenous vein graft to the right coronary artery is 100% occluded     proximally.   LEFT VENTRICULOGRAM:  Normal end-systolic and end-diastolic dimensions.  Overall, left ventricular function is moderately impaired, ejection fraction  approximately 45%. There is akinesis of the inferior wall.  LV pressure is  160/5, aortic was 160/90, LVEDP equals 15.  There is no mitral  regurgitation.  These findings were reviewed extensively with the patient and prior  films reviewed. It is felt that the distal left main trunk was noncritical.  This  vessel did extend into a intermediate artery which had moderate narrowing of  70% in the proximal segment and the stent covered the ostium of the left  circumflex artery making intervention of this vessel somewhat difficult.  This lesion also appeared stable of the proximal ADF circumflex.  We thus  elected to proceed with percutaneous intervention with saphenous vein graft  to the marginal branch due to the threatened closure of this vessel.  The  patient was enrolled in the Spider trial and randomized to distal protection  using the Spider device.  He had previously been treated with aspirin and  Plavix and was given heparin and Integrilin on a weight-adjusted basis.  The  7 [place] guide catheter was introduced and used to obtain guide shots.  However, adequate support to advance the Spider device could not be obtained  and an AL-1 guide catheter was then used to engage the vein graft.  The  spider device was deployed distally and a 3.0 x 16 mm Express II stent  introduced. The stent could not advance to the distal lesion and was thus  deployed in the mid lesion at 14 atmospheres for 30 seconds.  The Spider  device was then retracted and due to presence of debris in the basket a new  device was repositioned in the distal section of the saphenous vein graft.  A second 3.0 x 16 mm Express II stent was introduced, advanced into the  distal lesion and deployed at 14 atmospheres for 30 seconds.  This Spider  basket was again retracted and again debris was noted.  Repeat angiography  on each portion showed vessel destruction in the saphenous vein graft at the  location of the basket.  Due to the proximity of this lesion with the distal  anastomosis, it was felt that an additional basked could not be deployed and  a PercuSurge wire was used.  PercuSurge was deployed in the proximal segment  of the native left  circumflex artery until occlusion was noted.  A 3.0 x 12  mm Express II stent was introduced, carefully positioned in the distal  section of the vein graft in the area of vessel destruction with slight  overlap to the previously placed stent.  This was deployed at 12 atmospheres  for 30 seconds.  The stent delivery system was retracted slightly and used  to further dilate the junction of the two stents at 18 atmospheres for 30  seconds.  The PercuSurge balloon was deflated and repeat angiography  performed showing an excellent result with full coverage of the lesion and  no residual stenosis. There was TIMI-3 flow through the vein graft and  marginal branch. Intracoronary nitroglycerin was then administered and final  angiography performed in various projections confirming TIMI-3 flow through  the circumflex artery as well as the vein graft and no evidence of residual  vessel damage.  The guide catheter was then removed and the sheath secured in position.  The patient tolerated the procedure well and was transferred  to the ward in stable condition.   FINAL RESULTS:  1. Successful percutaneous transluminal coronary angioplasty and stent     placement to the mid and distal sections of the saphenous vein graft to     the first marginal branch with reduction of sequential of 70% and 95%  narrowings to 0% with placement of a 3.0 x 16 mm Express II stent     followed by a 3.0 x 16 mm Express II stent.  2. Successful stent placement to the distal section of the saphenous vein     graft with coverage of 50% dissection at the site of filter basket to 0%     with placement of a 3.0 x 12 mm Express II stent.   ASSESSMENT AND PLAN:  The patient is a 65 year old gentleman with  progressive coronary atherosclerotic disease. He has undergone percutaneous  intervention of the saphenous vein graft to the marginal branch of the left  circumflex artery.  He will be treated with Plavix indefinitely.  Should the  patient have recurrent symptoms, consideration may be given towards  intervention to the intermediate artery.                                                    Veneda Melter, M.D. [facility]    NG/MEDQ  D:  05/05/2002  T:  05/06/2002  Job:  [id number]   cc:   Gilford Raid Harsh  [address]  [place]  [place] [address]  Fax: [phone]   Lorin Picket, Dr.  Rosalita Levan

## 2010-11-08 NOTE — Op Note (Signed)
NAMESILVESTRE, MINES                            ACCOUNT NO.:  [id number]   MEDICAL RECORD NO.:  [id number]                   PATIENT TYPE:  INP   LOCATION:  2308                                 FACILITY:  [facility]   PHYSICIAN:  Gwenith Daily. Tyrone Sage, M.D.            DATE OF BIRTH:  08/06/1945   DATE OF PROCEDURE:  08/17/2002  DATE OF DISCHARGE:                                 OPERATIVE REPORT   PREOPERATIVE DIAGNOSIS:  Unstable angina.   POSTOPERATIVE DIAGNOSIS:  Unstable angina.   PROCEDURES:  1. Redo coronary artery bypass grafting with left radial artery harvesting,     left radial artery graft to the posterior descending coronary artery,     sequential reversed saphenous vein graft to the first obtuse marginal     and distal circumflex.  2. Right thigh endovein harvesting.   SURGEON:  Gwenith Daily. Tyrone Sage, M.D.   ASSESSMENT:  Toribio Harbour, R.N.   BRIEF HISTORY:  The patient is a 65 year old male who had previously  undergone coronary artery bypass grafting approximately 11 years prior.  He  had done well until 2003, when he had recurrent pain.  At that time cardiac  catheterization revealed patent sequential mammary to the diagonal LAD.  The  patient had a nongrafted circumflex that was in jeopardy, a patent vein  graft with disease to the obtuse marginal.  The right graft was totally  occluded.  Stenting of the left main was carried out at that time.  In  addition, angioplasty and stenting of the vein graft to the obtuse marginal  was also carried out.  Subsequently over the past six to seven months the  patient has had five catheterizations, the most recent of which showed  progression of disease in the circumflex, greater than 95% in-stent stenosis  of the vein graft to the obtuse marginal, and total occlusion of the right  coronary artery graft and native vessel and patent mammary to the LAD  diagonal.  Because of the patient's persistent symptoms, redo coronary  artery  bypass grafting was recommended; however, at the time the patient was  initially seen, he had been on Plavix and also had evidence of cellulitis in  his right forearm from an IV site.  His surgery was delayed to allow wash-  out of the Plavix and treatment of the right forearm cellulitis.  The  patient agreed to proceed with redo and signed informed consent.   DESCRIPTION OF PROCEDURE:  With Swan-Ganz and arterial line monitor in  place, the patient underwent general endotracheal anesthesia without  incident.  Skin of the chest, legs, and left arm was prepped with Betadine  and draped in the usual sterile manner.  Using a curvilinear incision over  the left forearm after checking a pulse oximeter Allen test on the forearm,  the radial artery was identified.  Using the Harmonic scalpel, the radial  artery was harvested  and appeared adequate for bypass.  The incision was  then closed with a running 2-0 stitch in the subcutaneous tissue and a 4-0  subcuticular stitch in the skin edges.  Median sternotomy was performed with  sagittal saw.  The ascending aorta was then dissected free, as was the right  atrium.  The patient was systemically heparinized, the ascending aorta and  the right atrium were cannulated.  A right retrograde cardioplegia catheter  was also introduced.  The patient was placed on cardiopulmonary bypass, 2.4  L/min. per sq. m, and the remainder of the right ventricle and left  ventricle were dissected free.  The left internal mammary artery was  identified and preserved.  The patient's body temperature was cooled to 30  degrees, aortic crossclamp was applied, and 500 mL of cold blood potassium  cardioplegia was administered through the ascending aorta.  In addition,  retrograde cardioplegia was also administered.  A bulldog was placed on the  identified mammary artery.  Myocardial septal temperature was monitored  throughout the crossclamp period.  Attention was turned first  to the  posterior descending coronary artery, which was opened and was a diffusely  diseased vessel but admitted a 1 mm probe.  The vessel was approximately 1.3-  1.4 mm in size.  Using a running 8-0 Prolene, the radial artery was  anastomosed to the posterior descending coronary artery.  Attention was then  turned to the lateral surface of the heart.  The obtuse marginal, which had  previously been grafted, was identified and opened.  This vessel was  approximately 1.3-1.4 mm in size and diffusely diseased.  Using the diamond  pipe, side-to-side anastomosis was carried out with a segment of reversed  saphenous vein graft, which had been endoscopically harvested from the right  thigh.  The distal extent of the vein was then carried to the circumflex  vessel, which was opened, and this only admitted a 1 mm probe proximally and  distally.  Using a running 8-0 Prolene, the vein was anastomosed to the  circumflex coronary artery.  With the crossclamp still in place, the grafts  were trimmed to the appropriate length and each was anastomosed to the hoods  of the old vein grafts.  With completion of the anastomosis, the heart was  de-aired and the aortic crossclamp was removed, total crossclamp time of 100  minutes.  The patient spontaneously converted to a sinus rhythm.  Doppler  flow was checked in the grafts and was present.  The patient was then  rewarmed and ventilated and weaned from cardiopulmonary bypass without  difficulty.  Total pump time was 152 minutes.  The patient remained  hemodynamically stable.  He was decannulated in the usual fashion.  Protamine sulfate was administered with the operative field hemostatic.  Two  atrial and two ventricular pacing wires were applied.  Graft markers from  the previous surgery continued to identify the new grafts.  Two mediastinal  tubes were left in place.  The sternum was closed with #6 stainless steel wire, the fascia closed with interrupted 0  Vicryl, running 3-0 Vicryl in the  subcutaneous tissue, 4-0 subcuticular stitch in the skin edges.  Dry  dressings were applied.  The sponge and needle count was reported as correct  at completion of the procedure.  The patient tolerated the procedure without  obvious complication and was transferred to the surgical intensive care unit  for further postoperative care.  Gwenith Daily Tyrone Sage, M.D.    Tyson Babinski  D:  08/17/2002  T:  08/18/2002  Job:  [id number]   cc:   Charlies Constable, M.D. [facility]

## 2010-11-08 NOTE — Discharge Summary (Signed)
[place]. [facility]  Patient:    Brandon [name], Brandon [name] Visit Number: [id number] MRN: [id number]          Service Type: MED Location: [phone] Attending Physician:  Junious Silk Dictated by:   Brita Romp, P.A. Admit Date:  12/15/2001 Discharge Date: 12/22/2001   CC:          Cardiology   Discharge Summary  DISCHARGE DIAGNOSES: 1. Coronary artery disease status post cardiac catheterization this admission. 2. Hypertension. 3. Hyperlipidemia. 4. Gout.  HISTORY OF PRESENT ILLNESS: [name] is a 65 year old male who underwent CABG surgery in 1991. Subsequently, he underwent percutaneous coronary intervention in 1998. He presented to the ER on December 15, 2001 complaining of some nausea and left arm pain as well as general malaise. The patient was seen and admitted by Dr. Olga Millers. Dr. Jens Som noted that the patients left arm pain was similar to that prior to his bypass surgery. He also felt that given the patients history and symptoms, the patient should undergo cardiac cath.  HOSPITAL COURSE: On December 15, 2001 the patient was taken to the cath lab by Dr. Riley Kill. Dr. Riley Kill found that the patient had a 95% lesion in the left main coronary artery, supplying the ramus as well as the AV circumflex. There were also tandom lesions in the saphenous vein graft to the obtuse marginal branch. The saphenous vein graft to the right coronary was totally occluded and the native right coronary was also totally occluded. The internal mammary artery graft to the diagonal and left anterior descending were patent with collateral filling to the right coronary. There was inferior hypokinesis but territory of the right coronary appeared viable. Dr. Riley Kill felt that the patients lesions were not amenable to percutaneous coronary intervention and requested a CVTS consultation.  CONSULTATION: On December 17, 2001 the patient was seen by Dr. Andrey Spearman of CVTS. Dr. Dorris Fetch felt that with re-do bypass grafting, there was a risk of injury to the patent mammary artery. He felt that if the artery was injured during dissection, then the patient would be worse off after the surgery than before. He planned to discuss the possibility of left main angioplasty with the cardiologist. Later that day, the patient underwent carotid duplex ultrasound evaluation. There was no evidence of significant plaque or ICA stenosis. Vertebral artery flow was antegrade bilaterally. Lower extremity Dopplers revealed ABIs as well as Doppler wave forms to be within normal limits.  CARDIAC CATHETERIZATION: On December 21, 2001, the patient was taken back to the cath lab by Dr. Loraine Leriche Pulsipher. He performed angioplasty and stenting to the lesion in the left main coronary artery using an Zeta stent. The approximate 99% lesion was reduced down to less than 10% with resulting TIMI-3 flow. He recommended continuing Plavix for a minimum of four weeks. The next day, the patient was seen by Dr. Jens Som. Dr. Jens Som noted that the patients groin was stable without hematoma or bruit and felt that the patient was ready for discharge.  LABORATORY DATA: Sodium 142, potassium 4.0, chloride 105, CO2 27, BUN 13, creatinine 1.0, glucose 104, white count 7.2, hemoglobin 16.0, hematocrit 47.7, platelets 185. AST 27, ALT 28, alkaphos 68, total bili 1.3, direct bili 0.2 and indirect bili 1.1. TSH 0.849.  DIAGNOSTIC STUDIES: Chest x-ray on admission showed no acute cardiopulmonary findings. 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 PR interval 168, QRS 118, QTC 436, axis 5.  DISCHARGE MEDICATIONS: 1.  Enteric coated aspirin 81 mg q.d. 2. Toprol XL 75 mg (one and one half 50 mg tabs) q.d. 3. Vasotec 20 mg q.d. 4. Lipitor 600 mg b.i.d. or as previously taken. 5. K-Dur 50 meq (20 meq tabs) b.i.d. 6. Prilosec 20 mg q.d. 7. Allopurinol 300 mg  q.d. 8. Norvasc 5 mg q.d. 9. HCTZ 12.5 mg q.d. 10.Plavix 75 mg q.d.  DISCHARGE INSTRUCTIONS: The patient is to avoid driving, heavy lifting, or tub bath for two days. He is to follow a low fat, low cholesterol diet. He is to watch the cath site for any pain, bleeding, or swelling and to call the [place] office for any of these problems. He is to contact Dr. Tiburcio Pea office for follow-up appointment in two weeks with a B-met to be drawn at that time. He is to follow-up with Dr. Lorin Picket in the office if needed or scheduled. Dictated by:   Brita Romp, P.A. Attending Physician:  Junious Silk DD:  12/22/01 TD:  12/24/01 Job: [id number]

## 2010-11-08 NOTE — Cardiovascular Report (Signed)
NAMEABDO, DENAULT                            ACCOUNT NO.:  [id number]   MEDICAL RECORD NO.:  [id number]                   PATIENT TYPE:  INP   LOCATION:  2006                                 FACILITY:  [facility]   PHYSICIAN:  Charlies Constable, M.D. [facility]              DATE OF BIRTH:  12/24/1945   DATE OF PROCEDURE:  08/09/2002  DATE OF DISCHARGE:                              CARDIAC CATHETERIZATION   PROCEDURES PERFORMED:  1. Left heart catheterization.  2. Selective coronary angiography.  3. Vein graft angiography.  4. Angiography of the left internal mammary artery graft.  5. Left ventriculography.  6. Distal aortography.   CLINICAL HISTORY:  The patient is 65 years old and has had previous bypass  surgery in 1991 and has had stents placed to the proximal and distal  portions of the vein graft to the marginal branch of the circumflex artery,  the last of which was one year ago.  He also has documented occlusion of the  vein graft to the right coronary artery.  Recently he developed recurrent  chest pain and was admitted to the hospital with unstable angina.  His  enzymes were negative, and he was scheduled for catheterization.   DESCRIPTION OF PROCEDURE:  The procedure was performed via the right femoral  artery using an arterial sheath and 6 French preformed coronary catheters.  A front wall arterial puncture was performed, and Omnipaque contrast was  used.  The JL4 catheters were used for injection of the vein graft.  A LIMA  catheter was used for injection of the LIMA graft.  Distal aortogram was  performed to rule out abdominal aortic aneurysm.  The patient tolerated the  procedure well and left the laboratory in satisfactory condition.   RESULTS:  The left main coronary artery:  The left main coronary artery was  free of significant disease.   Left anterior descending:  The left anterior descending artery was  completely occluded near its origin.   Circumflex artery:  The  circumflex artery gave rise to an intermediate  branch and an AV branch and a marginal branch,  which was totally occluded.  There was also a 90% stenosis in the proximal portion of the circumflex  artery.  The circumflex artery filled the distal right coronary artery by  collaterals.   Right coronary artery:  The right coronary was completely occluded near its  origin.  This vessel filled by collaterals from the circumflex artery.   The saphenous vein graft to the right coronary artery was occluded by prior  study.   The saphenous vein graft to the marginal branch of the circumflex artery had  a stent in its proximal and distal portions.  The distal portion had a 95%  stenosis.   The LIMA graft to the LAD and the diagonal branch to the LAD was patent and  functioned normally.   LEFT VENTRICULOGRAPHY:  The  left ventriculogram was performed in the RAO  projection showed hypokinesis to akinesis of the inferobasal segment.  The  overall wall motion was fairly good with an estimated ejection fraction of  50%.   DISTAL AORTOGRAM:  A distal aortogram was performed which showed patent  renal arteries and no significant aortoiliac obstruction.   HEMODYNAMIC DATA:  The aortic pressure was 119/65 with a mean of 87.  The  left ventricular pressure was 119/17.   CONCLUSION:  1. Coronary artery disease, status post coronary artery bypass graft surgery     in 1991.  2. Severe native vessel disease with total occlusion of the left anterior     descending and right coronary artery, 90% stenosis in the proximal     circumflex artery and total occlusion of the marginal branch.  3. Stenosis of 95% at the second stent site in the saphenous vein graft to     the marginal branch of the circumflex artery, total occlusion of the vein     graft to the right coronary artery (old) and a patent left internal     mammary artery graft to the left anterior descending and diagonal branch.  4. Inferobasal  wall hypokinesis to akinesis.   RECOMMENDATIONS:  The choices are redo bypass versus percutaneous  intervention of the vein graft to the marginal branch of the circumflex  artery and the AV circumflex artery in view of the fact that he has already  lost one vein graft in the distal right coronary artery.  It appears to be  suitable for revascularization and in view of the fact that there is  somewhat diffuse disease in the vein graft to the marginal branch of the  circumflex artery, I would lean toward bypass surgery.  I will discuss this  and the alternatives option of PCI to AV circumflex artery and vein graft to  the marginal branch of the circumflex artery with the patient formally and  make a final recommendation.                                               Charlies Constable, M.D. [facility]    BB/MEDQ  D:  08/09/2002  T:  08/10/2002  Job:  [id number]   cc:   Lucila Maine, M.D.  [place], [place]   Cardiopulmonary Lab

## 2010-11-08 NOTE — H&P (Signed)
[place]. [facility]  Patient:    GLENDEL, JAGGERS Visit Number: [id number] MRN: [id number]          Service Type: MED Location: [phone] Attending Physician:  Junious Silk Dictated by:   Madolyn Frieze. Jens Som, M.D. [facility] Admit Date:  12/15/2001                           History and Physical  HISTORY OF PRESENT ILLNESS:  The patient is a 65 year old male with a past medical history of coronary artery disease (status coronary artery bypass grafting in 1991; status post PCI in 1998; _____ known), hypertension, hyperlipidemia, gastroesophageal reflux disease, and gout, who presents with left upper extremity pain.  Of note, the patient exercises routinely and does not have exertional chest pain, dyspnea on exertion, orthopnea, PND, pedal edema, palpitations, presyncope, or syncope.  This morning he awoke with "chills."  He denies any fevers, productive cough, sore throat, or diarrhea. He subsequently felt palpitations and had left upper extremity pain similar to his symptoms prior to his bypass but less severe.  These lasted for approximately 15 minutes, and then he took sublingual nitroglycerin, which improved the pain after approximately 10 minutes.  Because of his symptoms he presented to the emergency room, and we were asked to further evaluate.  He is presently pain-free.  Of note, he did not have any exertional chest pain prior to this.  He did have associated nausea today, but there was no shortness of breath or diaphoresis.  The pain was not associated with any type of movement.  MEDICATIONS:  K-Dur 20 mEq two p.o. b.i.d., sublingual nitroglycerin p.r.n., omeprazole 20 mg p.o. q.o.d., Toprol 50 mg q.d., Norvasc 5 mg p.o. q.d., allopurinol, aspirin 81 mg p.o. q.d., Lopid 600 mg p.o. q.d., enalapril 20 mg, hydrochlorothiazide 12.5 mg p.o. q.d., and Lipitor 40 mg p.o. q.d.  ALLERGIES:  TICLID, which caused a rash.  He is also allergic to  CODEINE.  SOCIAL HISTORY:  He does not smoke, nor does he consume alcohol.  FAMILY HISTORY:  Positive for coronary artery disease in both his father and his mother.  PAST MEDICAL HISTORY:  Significant for hypertension and hyperlipidemia.  There is no diabetes mellitus.  He is status post coronary artery bypass graft as well as PCI as described in the HPI.  He has had an appendectomy and tonsillectomy.  He has a history of gout as well as gastroesophageal reflux disease.  The remaining past medical history is unremarkable.  REVIEW OF SYSTEMS:  He denies any headaches in the recent past.  There is no fever, but he did complain of chills this morning.  There is no productive cough, no hemoptysis.  There is no dysphasia, odynophagia, melena, or hematochezia.  There is no history of hematuria.  There is no history of seizure activity. There is no orthopnea, PND, or pedal edema.  There is no claudication noted.  The remaining systems are negative.  PHYSICAL EXAMINATION:  VITAL SIGNS:  Blood pressure 166/108, and his pulse is 88.  He is afebrile.  GENERAL:  He is well-developed and well-nourished, in no acute distress.  SKIN:  Warm and dry.  HEENT:  Unremarkable with normal eyelids.  NECK:  Supple with a normal upstroke bilaterally, and there are no bruits noted.  There was no jugular venous distention or thyromegaly noted.  CHEST:  Clear to auscultation and percussion.  CARDIAC:  Regular rate and rhythm  with normal S1 and S2.  There are no murmurs, rubs, or gallops noted.  ABDOMEN:  Nontender, nondistended, positive bowel sounds.  No hepatosplenomegaly and no masses appreciated.  He does have an umbilical hernia.  He has 2+ femoral pulses bilaterally and no bruits.  EXTREMITIES:  No edema.  I can palpate no cords.  He has 2+ dorsalis pedis pulses bilaterally.  NEUROLOGIC:  Grossly intact.  LABORATORY DATA:  His electrocardiogram shows a normal sinus rhythm at a rate of 76.   The axis is normal.  There is a prior inferior infarct but no acute ST changes.  His chest x-ray shows no acute disease.  His hemoglobin is 16.4 with hematocrit of 47.7.  His platelet count is 179, and his white blood cell count is 11.7.  His sodium is 140 with a potassium of 3.4.  His BUN and creatinine are 17 and 1.1.  DIAGNOSES: 1. Left upper extremity pain similar to his symptoms prior to coronary artery    bypass graft. 2. History of coronary artery bypass graft in 1991. 3. Hypertension. 4. Hyperlipidemia. 5. History of gastroesophageal reflux disease. 6. Gout.  Mr. Trentman presents with left upper extremity pain concerning in that it is similar to his symptoms prior to his bypass.  He is presently pain-free, and his electrocardiogram shows no acute ST changes.  We will admit and rule out myocardial infarction with serial enzymes.  We will treat with aspirin as well as Toprol and heparin and nitroglycerin.  I have discussed the risks and benefits of cardiac catheterization with Mr. Heard and he agrees to proceed, and we will schedule this for December 16, 2001.  I think this is important, in particular given his recurrent symptoms and the fact that his grafts are now 63 years old.  There was also some degree of nausea with his pain, and we will check his liver functions as well as an amylase and lipase.  We will make further recommendations once we have the above results. Dictated by:   Madolyn Frieze. Jens Som, M.D. [facility] Attending Physician:  Junious Silk DD:  12/15/01 TD:  12/16/01 Job: [id number]

## 2010-11-08 NOTE — Cardiovascular Report (Signed)
NAMEBRENTON, Brandon [name]                            ACCOUNT NO.:  [id number]   MEDICAL RECORD NO.:  [id number]                   PATIENT TYPE:  INP   LOCATION:  1827                                 FACILITY:  [facility]   PHYSICIAN:  Salvadore Farber, M.D. [facility]         DATE OF BIRTH:  1946/06/17   DATE OF PROCEDURE:  05/23/2002  DATE OF DISCHARGE:                              CARDIAC CATHETERIZATION   PROCEDURES:  Coronary angiography, saphenous vein graft angiography x2, left  subclavian, and selective left internal mammary artery angiography, left  heart catheterization, left ventriculography.   INDICATIONS:  The patient is a 65 year old gentleman, status post coronary  artery bypass grafting in the early 1990s.  He presented in mid November of  this year with unstable angina.  The culprit was felt to be a saphenous vein  graft to the obtuse marginal, which was stented in a difficulty procedure  though ultimately with a good result. He now presents with recurrent chest  pain occurring at rest and is referred for diagnostic angiography.   DIAGNOSTIC TECHNIQUE:  Informed consent was obtained. Under 1% lidocaine  local anesthesia, a 6 French sheath was placed in the right femoral artery  using a modified Seldinger technique.  Diagnostic angiography was performed  using JL4 catheter to engage the native left coronary, a JR4 catheter to  engage the native right, saphenous vein graft and the left subclavian  artery.  The left internal mammary artery was selectively engaged using a  left internal mammary artery catheter.  Ventriculography was performed using  an angled pigtail catheter. The patient tolerated the procedure well and was  transferred to the holding room in stable condition.  The sheaths are to be  removed there.   COMPLICATIONS:  None.   FINDINGS:  1. EF equals 65% with mid inferior akinesis.  2. LV:  128/4/13 after the administration of contrast.  3. No aortic stenosis or  mitral regurgitation.  4. Left main:  There is a patent stent placed in the distal left main     extending over the ostium of the circumflex artery.  There is in-stent re-     stenosis of approximately 40%.  The more proximal left main has     approximately 30% stenosis.  5. LAD:  The LAD is occluded proximally.  There is a sequential left     internal mammary artery graft to first the diagonal branch and     subsequently to the left anterior descending described as widely patent     with excellent distal runoff.  There is collateral flow to the PDA.  6. Ramus intermedius:  The ramus is a moderate sized vessel.  There is a 60%     stenosis of the ostium of the vessel.  7. Circumflex:  The circumflex is a moderate sized vessel.  There is a     single patent obtuse marginal. There is a 60%  stenosis at the AV groove     circumflex.  There are left to right collaterals from this native left     system.  The recently intervened upon saphenous vein graft to obtuse     marginal is widely patent.  There is approximately 30% stenosis which is     unchanged from previous.  8. RCA:  The RCA is occluded proximally.  The saphenous vein graft to the     RCA is occluded.  9. Left subclavian artery is patent without stenosis.   IMPRESSION/PLAN:  The patient has widely patent recently placed stents in  the saphenous vein graft to the obtuse marginal with excellent distal  runoff.  His other coronary lesions are unchanged compared to the films  May 05, 2002.  Therefore, his chest pain occurring at rest today is  unlikely to be due to myocardial ischemia.  Though  ischemia might be inducible with exertion, I do not think this represents  the etiology of the rest pain that prompted his presentation today.  I will  therefore recommend continued medical therapy with an emphasis on secondary  prevention.                                                       Salvadore Farber, M.D. [facility]     WED/MEDQ  D:  05/23/2002  T:  05/23/2002  Job:  [id number]   cc:   Gilford Raid Harsh  [address]  [place]  [place] [address]  Fax: [phone]

## 2010-11-08 NOTE — H&P (Signed)
Brandon [name], [name]                            ACCOUNT NO.:  [id number]   MEDICAL RECORD NO.:  [id number]                   PATIENT TYPE:  INP   LOCATION:  1827                                 FACILITY:  [facility]   PHYSICIAN:  Charlton Haws, M.D. [facility]              DATE OF BIRTH:  March 25, 1946   DATE OF ADMISSION:  05/23/2002  DATE OF DISCHARGE:                                HISTORY & PHYSICAL   HISTORY:  The patient is a pleasant 65 year old patient of Dr. Lorin Picket and Dr.  Sylvie Farrier.   He has known coronary artery disease.  He has had previous CABG in 1991.  He  just had a procedure by Dr. Chales Abrahams on the 13th involving stenting to the  distal anastomotic site to the OM graft.  This procedure was somewhat  complicated with distal embolus and large clot burden.   The patient returns today with substernal chest pain.  The pain radiates to  his throat.  It started with exertion and made him somewhat short of breath.  The pain was intermittent over the last couple of days.   The patient has had some symptoms of reflux.  He took some nitroglycerin  without total relief.  The pain is not totally similar to that which brought  him in for his stent placement on the 13th.   REVIEW OF SYSTEMS:  Otherwise remarkable for headache from his Imdur.   ALLERGIES:  1. TICLID.  2. CODEINE.   MEDICATIONS:  1. Vasotec 20 mg b.i.d.  2. Imdur 30 mg a day.  3. Norvasc 5 mg a day.  4. Lopid 600 mg a day.  5. Toprol 100 mg a day.  6. Hydrochlorothiazide 12.5 mg a day.  7. Allopurinol 300 mg a day.  8. Nexium 40 mg a day.  9. Potassium 30 b.i.d.  10.      Plavix 75 mg a day.  11.      An aspirin a day.  12.      Lipitor 60 mg a day.   PHYSICAL EXAMINATION:  GENERAL:  Remarkable for a middle-aged man in no  distress.  VITALS:  Blood pressure 130/70, pulse 70 and regular.  LUNGS:  Clear.  Carotids are normal.  There is no __________ no edema.   LABORATORY DATA:  Patient's electrocardiogram shows sinus  rhythm with no  acute changes.  Creatinine is 0.7, K is 4.1.  Initial enzymes are negative.  Coag's are normal.   IMPRESSION:  Patient has recurrent chest pain, although not typical of  angina.  He has had aggressive post coronary artery bypass graft coronary  disease.  His recent intervention was complicated with a lot of clot burden  and distal thrombosis despite protection device.  He also had a residual 70%  lesion in the mid body of the obtuse marginal graft.  I think the best thing  to do at this point  would be to refer him for repeat heart catheterization.  If the stent is widely patent and he only has borderline lesions elsewhere,  we can then possibly do a Cardiolite study to rule out significant ischemia.   However, since he has had a somewhat complicated procedure recently, I think  it behooves [place] to rule out early restenosis.                                                Charlton Haws, M.D. [facility]    PN/MEDQ  D:  05/23/2002  T:  05/23/2002  Job:  [id number]

## 2010-11-08 NOTE — Discharge Summary (Signed)
Brandon [name], Brandon [name]                ACCOUNT NO.:  [id number]   MEDICAL RECORD NO.:  [id number]          PATIENT TYPE:  INP   LOCATION:  3735                         FACILITY:  [facility]   PHYSICIAN:  Vida Roller, M.D.   DATE OF BIRTH:  1945/07/21   DATE OF ADMISSION:  12/30/2004  DATE OF DISCHARGE:  01/01/2005                                 DISCHARGE SUMMARY   PROCEDURE:  None.   DISCHARGE DIAGNOSES:  1.  Chest pain, cardiac enzymes negative for myocardial infarction and      unchanged ejection fraction. Follow up with Dr. Sylvie Farrier in [place].  2.  Left ventricular dysfunction with an ejection fraction of 45% by      catheterization.  3.  History of aortocoronary bypass surgery in 1991 and 2004 with a stent to      the left main in 2003 as well as the stent to saphenous vein graft to      obtuse marginal x2 in 2003.  4.  Status post appendectomy.  5.  Tonsillectomy.  6.  Hernia repair.  7.  History of postoperative atrial fibrillation.  8.  History of rheumatic fever.  9.  History of umbilical hernia.  10. History of gastroesophageal reflux disease symptoms.  11. Hypertension.  12. Hyperlipidemia.  13. Family history of coronary artery disease.  14. History of gout.  15. Allergy or intolerance to codeine and Ticlid.   HOSPITAL COURSE:  Brandon [name] is a 65 year old male with known coronary artery  disease. He began having abdominal and chest discomfort as well as  palpitations. They awoke him at 3:30 on the day of admission and he was in  trigeminy. He was admitted for further evaluation and treatment.   He was continued on his home doses of beta blocker and calcium channel  blocker and his palpitations improved. He still had some atrial bigeminy at  times but this did not effect his blood pressure or cause chest pain. He had  an echocardiogram which showed an EF of 45%.   He was observed and then evaluated by Dr. Antoine Poche. He was considered stable  for discharge on December 31, 2004 and is to follow up as an outpatient with his  primary care physician and with his primary cardiologist as well.   DISCHARGE INSTRUCTIONS:  His activity level is to be as tolerated. He is to  stick to a diet that is low in salt, fat, and cholesterol. He is to follow  up with Dr. Sylvie Farrier and with Dr. Lorin Picket.   DISCHARGE MEDICATIONS:  1.  HCTZ 12.5 mg daily.  2.  Toprol XL 50 mg daily.  3.  K-Dur 20 mEq t.i.d.  4.  Cardia XT 240 mg daily.  5.  Allopurinol 300 mg daily.  6.  Nexium 40 mg daily.  7.  Gemfibrozil 600 mg daily.  8.  Aspirin 81 mg daily.  9.  Multivitamin daily.  10. Isosorbide 30 mg daily.  11. Lipitor 80 mg q.h.s.  12. Plavix 75 mg daily.  13. Zetia 10 mg daily.  14. Fish oil 1200 mg daily.  15. Altace 7.5 mg daily.  16. Nitroglycerin p.r.n.      Theodore Demark, P.A. [facility]      Vida Roller, M.D.  Electronically Signed    RB/MEDQ  D:  02/17/2005  T:  02/18/2005  Job:  [id number]   cc:   Vida Roller, M.D.  Fax: [phone]   Heide Guile, MD  [address]  [place]  [place] [address]  Fax: [phone]   Lorin Picket, M.D.  , [place]

## 2010-11-08 NOTE — Assessment & Plan Note (Signed)
[facility]                              CARDIOLOGY OFFICE NOTE   JKAI, ARWOOD                         MRN:          [id number]  DATE:01/29/2006                            DOB:          1946-04-12    REASON FOR VISIT:  The patient is transferring care from Dr. Sylvie Farrier.   HISTORY OF PRESENT ILLNESS:  Brandon [name] is a 65 year old gentleman with long-  standing coronary disease.  He is status post coronary artery bypass  grafting in 1991.  He then suffered an inferior myocardial infarction in  1998.  He underwent stenting to the vein graft to the first obtuse marginal  in 2004.  The vein graft to the right is occluded.  Vein graft to the  marginal is patent.  LIMA to the LAD is patent at most recent cardiac  catheterization in 2004.  His ejection fraction is 45 to 50%.   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  He has not had any angina, PND,  orthopnea, edema, exertional dyspnea, syncope, presyncope, or palpitations.  In short, he feels himself to be doing very well.   PAST MEDICAL HISTORY:  1. Coronary artery disease, status post CABG as above.  2. Prior myocardial infarction.  3. Status post tonsillectomy.  4. Status post appendectomy.  5. Status post hernia repair.  6. History of rheumatic fever as a child, though no valvular disease      subsequently noted.  7. Hypercholesterolemia.  8. Hypertriglyceridemia.  9. Hypertension.  10.Benign prostatic hypertrophy.   ALLERGIES:  1. TICLID.  2. CODEINE.   CURRENT MEDICATIONS:  1. Prilosec OTC 20 mg daily.  2. HCTZ 12.5 mg daily.  3. Altace 5 mg daily.  4. Zetia 10 mg daily.  5. Cartia XT 240 mg daily.  6. Gemfibrozil 600 mg daily.  7. Allopurinol 300 mg daily.  8. Enteric coated aspirin 81 mg daily.  9. Multivitamin.  10.Fish oil 1200 mg daily.  11.K-Dur 60 mEq daily.  12._____________400 mg b.i.d.  13.Imdur 30 mg  daily.  14.Lipitor 80 mg daily.  15.Plavix 75 mg daily.   SOCIAL HISTORY:  The patient works as a Chartered certified accountant.  He enjoys Pharmacist, community.  He is divorced.  He has a 7 year old daughter and a 86-  year-old son.  He smoked for about five years and then quit.  He denies  alcohol or illicit drug use.   FAMILY HISTORY:  Father died at 31 of heart problems.  Mother is alive at  56, with a prior stroke and coronary disease in the setting of diabetes.  A  paternal grandfather had an abdominal aortic aneurysm.  His three siblings  are alive and well in their 75s.  His two children are alive and well.   REVIEW OF SYSTEMS:  Notable for wearing glasses, has occasional tinnitus.  Review of systems is otherwise negative in detail except as above.   PHYSICAL EXAMINATION:  GENERAL:  He is generally well-appearing in no  distress.  VITAL SIGNS:  Heart rate 59, blood pressure 124/80.  He is 6 feet tall and  weighs 204 pounds.  NECK:  He has no jugular venous distention and no thyromegaly.  There is no  lymphadenopathy.  HEENT:  Normal.  MUSCULOSKELETAL:  Normal.  SKIN:  There is a sunburn worse on the right leg than the left.  CARDIOVASCULAR:  He has a nondisplaced point of maximal cardiac impulse.  There is a regular rate and rhythm without murmurs, rubs, or gallops.  Median sternotomy is nicely healed.  RESPIRATORY:  Respiratory effort is normal.  LUNGS:  Clear to auscultation.  ABDOMEN:  Soft, nontender, nondistended.  There is no hepatosplenomegaly.  Bowel sounds are normal.  EXTREMITIES:  Warm without cyanosis, clubbing, or edema, or ulceration.  Carotid pulses 2+ bilaterally without bruit.  Femoral pulses 2+ bilaterally.  PT pulses 2+ bilaterally.   LABORATORY DATA:  Electrocardiogram demonstrates sinus bradycardia with  inferior Q-waves consistent with infarct.   IMPRESSION AND RECOMMENDATIONS:  1. Coronary disease, status post coronary artery bypass grafting and prior       myocardial infarction.  The patient is asymptomatic.  We will continue      aspirin.  Given his prior myocardial infarction, we will switch him off      the calcium channel blocker in favor of Toprol XL.  It is unclear from      the patient and from his chart why this switch was made in the first      place.  He can recall no untoward reaction with the beta blocker.      Since there is a mortality benefit with beta blocker post myocardial      infarction, I would strongly favor a switch to it.  The patient agrees.  2. Ischemic cardiomyopathy.  Ejection fraction 45 to 50%.  Continue Altace      and initiate beta blocker.  3. Hypertension.  Blood pressure well controlled on three medications.  I      have asked him to check them daily and bring them to me when I see him      in six weeks.  4. Hypercholesterolemia.  Check lipid profile and liver function tests      today.  Might consider switch to Vytorin for cost savings depending      upon profile.   I will plan on seeing him back in six weeks.                                 Salvadore Farber, MD    WED/MedQ  DD:  01/29/2006  DT:  01/29/2006  Job #:  [id number]   cc:   Lucila Maine, M.D.

## 2010-11-08 NOTE — Discharge Summary (Signed)
Brandon [name], Brandon [name]                            ACCOUNT NO.:  [id number]   MEDICAL RECORD NO.:  [id number]                   PATIENT TYPE:  INP   LOCATION:  6522                                 FACILITY:  [facility]   PHYSICIAN:  Pricilla Riffle, M.D. [facility]             DATE OF BIRTH:  May 24, 1946   DATE OF ADMISSION:  05/04/2002  DATE OF DISCHARGE:  05/06/2002                                 DISCHARGE SUMMARY   DISCHARGE DIAGNOSES:  1. Chest pain, status post cardiac catheterization with percutaneous     coronary intervention.  2. Coronary artery disease, status post bypass surgery.  3. Hypertension.  4. Dyslipidemia.  5. Hiatal hernia.  6. History of rheumatic fever.   HOSPITAL COURSE:  The patient is a 65 year old male who is followed by Dr.  Sylvie Farrier in [place].  He presented to Dr. Sylvie Farrier for increased heart rate and  chest pressure.  He was admitted to [facility] and ruled out for  myocardial infarction.  He was subsequently transferred to [facility] for further evaluation.  He was seen and admitted by Dr. Dietrich Pates.  She agreed that the patient needed repeat re-look catheterization and  she felt that if he had recurrent pain then a IIB/IIIA agent would be added.   The next day, the patient was taken to the catheterization lab by Dr. Veneda Melter.  Catheterization revealed a 70% mid-vessel/95% distal lesion with  thrombus in the saphenous vein graft to the first obtuse marginal branch.  Dr. Chales Abrahams then performed angioplasty and stenting reducing the lesion down  to zero residual.  The lesions were covered with Express II stents.  A small  filter dissection was also covered with an Express II stent.   The next day, the patient was doing well and had no further chest pain or  shortness of breath and his groin was stable.  He was noted to be somewhat  hypokalemic at 3.1.  This was repleted before discharge.  Dr. Chales Abrahams  recommended that the patient remain on Plavix  indefinitely.   DISCHARGE MEDICATIONS:  1. Vasotec 20 mg b.i.d.  2. Norvasc 5 mg q.d.  3. Lopid 600 mg q.d.  4. Toprol XL 100 mg q.d.  5. Hydrochlorothiazide 12.5 mg q.d.  6. Allopurinol 300 mg q.d.  7. Nexium 40 mg q.d.  8. Potassium 50 mEq (two and one-half 20 mEq tablets) b.i.d.  9. Plavix 75 mg q.d.  10.      Enteric-coated aspirin 325 mg q.d.  11.      Sublingual nitroglycerin as needed.  12.      Sulfamido 400 mg b.i.d.  13.      Lipitor 50 mg q.h.s.   LABORATORY VALUES:  White count 5.9, hemoglobin 15.3, hematocrit 44.0,  platelets 182.    DISCHARGE INSTRUCTIONS:  1. The patient is to avoid driving, heavy lifting, or tub baths  for two     days.  2. He is to follow a low-fat/low-salt diet.  3. Watch the catheterization site for any pain, bleeding, or swelling and to     call the [place] office with any of these problems.  4. He is to have a BMET drawn at either Dr. Roby Lofts or Dr. Ivin Poot office on     Monday, November 17.  5. He is to follow up with Dr. Sylvie Farrier on November 28 at 3 p.m.  6. He is to follow up with Dr. Lorin Picket as needed or scheduled.     Annett Fabian, P.A. [facility]                  Pricilla Riffle, M.D. [facility]    CKM/MEDQ  D:  05/06/2002  T:  05/06/2002  Job:  [id number]   cc:   Gilford Raid Harsh  [address]  [place]  [place] [address]  Fax: [phone]   Dr. Marda Stalker

## 2010-11-28 ENCOUNTER — Other Ambulatory Visit: Payer: Self-pay | Admitting: *Deleted

## 2010-11-28 MED ORDER — POTASSIUM CHLORIDE CRYS ER 20 MEQ PO TBCR: 20.0000 meq | EXTENDED_RELEASE_TABLET | Freq: Every day | ORAL | Status: DC

## 2010-12-13 ENCOUNTER — Telehealth: Payer: Self-pay | Admitting: *Deleted

## 2010-12-13 NOTE — Telephone Encounter (Signed)
Called patient to let him know that we received his BP reading and that they are stable. No medication changes per Dr.Ross.

## 2010-12-22 ENCOUNTER — Other Ambulatory Visit: Payer: Self-pay | Admitting: Internal Medicine

## 2011-01-13 ENCOUNTER — Telehealth: Payer: Self-pay | Admitting: Internal Medicine

## 2011-01-13 DIAGNOSIS — I10 Essential (primary) hypertension: Secondary | ICD-10-CM

## 2011-01-13 MED ORDER — METOPROLOL SUCCINATE ER 25 MG PO TB24: ORAL_TABLET | ORAL | Status: DC

## 2011-01-13 NOTE — Telephone Encounter (Signed)
In [facility] [address] in [place] it is actually in the town [place] [address] metaprolol 50mg  qd

## 2011-01-13 NOTE — Telephone Encounter (Signed)
Refill sent to pharm

## 2011-01-14 ENCOUNTER — Telehealth: Payer: Self-pay | Admitting: *Deleted

## 2011-01-14 DIAGNOSIS — I10 Essential (primary) hypertension: Secondary | ICD-10-CM

## 2011-01-14 MED ORDER — METOPROLOL SUCCINATE ER 50 MG PO TB24: 50.0000 mg | ORAL_TABLET | Freq: Every day | ORAL | Status: DC

## 2011-01-14 NOTE — Telephone Encounter (Signed)
Brandon Stone states pt's rx for Metoprolol was sent in yest as 25 mg 1/2 tab bid, she states pt states this is incorrect and should be 50 mg daily which is what he has been taking, records from Jan show 50 mg daily unsure of where new dose comes from Dr Tenny Craw does not mention changing it anywhere I can see gave order of 50 mg daily

## 2011-01-20 ENCOUNTER — Telehealth: Payer: Self-pay | Admitting: *Deleted

## 2011-01-20 NOTE — Telephone Encounter (Signed)
LMOM private cell phone with Lipid panel results from [place] from [facility] 2012. No changes in medication per Dr.Ross.

## 2011-01-28 ENCOUNTER — Encounter: Payer: Self-pay | Admitting: Internal Medicine

## 2011-05-07 ENCOUNTER — Other Ambulatory Visit: Payer: Self-pay | Admitting: Internal Medicine

## 2011-06-19 ENCOUNTER — Other Ambulatory Visit: Payer: Self-pay | Admitting: Internal Medicine

## 2011-07-04 ENCOUNTER — Encounter: Payer: Self-pay | Admitting: Internal Medicine

## 2011-07-04 ENCOUNTER — Ambulatory Visit (INDEPENDENT_AMBULATORY_CARE_PROVIDER_SITE_OTHER): Payer: 59 | Admitting: Internal Medicine

## 2011-07-04 DIAGNOSIS — I2510 Atherosclerotic heart disease of native coronary artery without angina pectoris: Secondary | ICD-10-CM

## 2011-07-04 DIAGNOSIS — R079 Chest pain, unspecified: Secondary | ICD-10-CM

## 2011-07-04 DIAGNOSIS — E785 Hyperlipidemia, unspecified: Secondary | ICD-10-CM

## 2011-07-04 DIAGNOSIS — I10 Essential (primary) hypertension: Secondary | ICD-10-CM

## 2011-07-04 NOTE — Progress Notes (Signed)
HPI Patient is a 66 year old with a history of CAD,HTN, dyslipidemia.  (s/p CABG 1991.  IWMI 1998.  Redo CABG in 2004 (L radial to PDA; SVG to OM1/distal LCx).  Remains on plavix because of extensive disease.  I saw him in clinic in May of last year. Since seen he is Walking regularly   Pushing self. No Chest pain.  Breathing OK. BP he has not taken in 3 wks.  Prior to that was good.  120s to 130s/ 80 to 85. Allergies  Allergen Reactions  . Codeine   . Ticlopidine Hcl     Current Outpatient Prescriptions  Medication Sig Dispense Refill  . allopurinol (ZYLOPRIM) 300 MG tablet Take 300 mg by mouth daily.        Marland Kitchen amLODipine (NORVASC) 5 MG tablet TAKE 1 TABLET BY MOUTH EVERY DAY  30 tablet  6  . aspirin 81 MG tablet Take 81 mg by mouth daily.        . clopidogrel (PLAVIX) 75 MG tablet Take 75 mg by mouth daily.        Marland Kitchen KLOR-CON M20 20 MEQ tablet TAKE 1 TABLET BY MOUTH EVERY DAY  30 tablet  6  . metoprolol succinate (TOPROL-XL) 50 MG 24 hr tablet Take 1/2 tab twice a day      . Multiple Vitamin (MULTIVITAMIN) capsule Take 1 capsule by mouth daily.        . Omega-3 Fatty Acids (FISH OIL) 1200 MG CAPS Take 1 capsule by mouth daily.        Marland Kitchen omeprazole (PRILOSEC) 20 MG capsule Take 20 mg by mouth 2 (two) times daily.        . rosuvastatin (CRESTOR) 20 MG tablet Take 10 mg by mouth daily.        Marland Kitchen triamterene-hydrochlorothiazide (MAXZIDE-25) 37.5-25 MG per tablet TAKE 1 TABLET BY MOUTH EVERY DAY  30 tablet  6  . valsartan (DIOVAN) 80 MG tablet Take 40 mg by mouth 2 (two) times daily.         Past Medical History  Diagnosis Date  . CAD (coronary artery disease)      CABG 1991, 2004;  IWMI  1998.  S/P PTCA  stensts to SVG ot OM last in 2003.  Marland Kitchen HTN (hypertension)   . Dyslipidemia   . BPH (benign prostatic hypertrophy)   . Gout   . Rheumatic fever     as a child    Past Surgical History  Procedure Date  . Coronary artery bypass graft     1991 (LIMA to LAD/Diag;  SVG to RCA;  SVG to OM)   . Coronary artery bypass graft     2004 (L radial to PDA; SVG to OM1/ distal LCx)  . Tonsillectomy   . Appendectomy   . Hernia repair     No family history on file.  History   Social History  . Marital Status: Legally Separated    Spouse Name: N/A    Number of Children: N/A  . Years of Education: N/A   Occupational History  . Not on file.   Social History Main Topics  . Smoking status: Former Games developer  . Smokeless tobacco: Not on file  . Alcohol Use: Not on file  . Drug Use: No  . Sexually Active: Not on file   Other Topics Concern  . Not on file   Social History Narrative  . No narrative on file    Review of Systems:  All systems  reviewed.  They are negative to the above problem except as previously stated.  Vital Signs: BP 149/90  Pulse 57  Ht 6' (1.829 m)  Wt 216 lb (97.977 kg)  BMI 29.29 kg/m2  Physical Exam  Patient is in NAD  HEENT:  Normocephalic, atraumatic. EOMI, PERRLA.  Neck: JVP is normal. No thyromegaly. No bruits.  Lungs: clear to auscultation. No rales no wheezes.  Heart: Regular rate and rhythm. Normal S1, S2. No S3.   No significant murmurs. PMI not displaced.  Abdomen:  Supple, nontender. Normal bowel sounds. No masses. No hepatomegaly.  Extremities:   Good distal pulses throughout. No lower extremity edema.  Musculoskeletal :moving all extremities.  Neuro:   alert and oriented x3.  CN II-XII grossly intact.  EKG  Sinus bradycardia    Assessment and Plan:

## 2011-07-04 NOTE — Patient Instructions (Signed)
Your physician wants you to follow-up in:  12 months.  You will receive a reminder letter in the mail two months in advance. If you don't receive a letter, please call our office to schedule the follow-up appointment.   

## 2011-07-08 NOTE — Assessment & Plan Note (Addendum)
Will need to get fasting lipids.  Gets [facility]>  Ask that he fax results.

## 2011-07-08 NOTE — Assessment & Plan Note (Signed)
NO symptoms to suggest angina.  Keep on same regimen. 

## 2011-07-08 NOTE — Assessment & Plan Note (Signed)
BP is better at home.  I would keep on same regimen.  Follow.

## 2011-07-11 ENCOUNTER — Other Ambulatory Visit: Payer: Self-pay | Admitting: *Deleted

## 2011-07-11 MED ORDER — TRIAMTERENE-HCTZ 37.5-25 MG PO TABS: 1.0000 | ORAL_TABLET | Freq: Every day | ORAL | Status: DC

## 2011-12-14 ENCOUNTER — Other Ambulatory Visit: Payer: Self-pay | Admitting: Internal Medicine

## 2011-12-26 ENCOUNTER — Telehealth: Payer: Self-pay | Admitting: Internal Medicine

## 2011-12-26 NOTE — Telephone Encounter (Signed)
error 

## 2011-12-29 ENCOUNTER — Ambulatory Visit (INDEPENDENT_AMBULATORY_CARE_PROVIDER_SITE_OTHER): Payer: 59 | Admitting: Internal Medicine

## 2011-12-29 ENCOUNTER — Encounter: Payer: Self-pay | Admitting: Internal Medicine

## 2011-12-29 VITALS — BP 128/86 | HR 66 | Ht 72.0 in | Wt 216.0 lb

## 2011-12-29 DIAGNOSIS — I2510 Atherosclerotic heart disease of native coronary artery without angina pectoris: Secondary | ICD-10-CM

## 2011-12-29 NOTE — Progress Notes (Addendum)
HPI Patient is a 66 year old with a history of CAD,HTN, dyslipidemia. (s/p CABG 1991. IWMI 1998. Redo CABG in 2004 (L radial to PDA; SVG to OM1/distal LCx). Remains on plavix because of extensive disease. I saw him in clinic inJanuary of this year  Now complains of pain in L arm.  Was lifting heavy bars a few weeks ago. Pain with lifting.   Also when wife walks fast will get chest tightness and some L arm pain With argument had pain L arm and chest pressure. Allergies  Allergen Reactions  . Codeine   . Ticlopidine Hcl     Current Outpatient Prescriptions  Medication Sig Dispense Refill  . allopurinol (ZYLOPRIM) 300 MG tablet Take 300 mg by mouth daily.        Marland Kitchen amLODipine (NORVASC) 5 MG tablet TAKE 1 TABLET BY MOUTH EVERY DAY  30 tablet  6  . aspirin 81 MG tablet Take 81 mg by mouth daily.        . clopidogrel (PLAVIX) 75 MG tablet Take 75 mg by mouth daily.        Marland Kitchen KLOR-CON M20 20 MEQ tablet TAKE 1 TABLET BY MOUTH EVERY DAY  30 tablet  6  . meloxicam (MOBIC) 15 MG tablet as needed.      . methocarbamol (ROBAXIN) 500 MG tablet as needed.      . metoprolol succinate (TOPROL-XL) 50 MG 24 hr tablet Take 1/2 tab twice a day      . Multiple Vitamin (MULTIVITAMIN) capsule Take 1 capsule by mouth daily.        . Omega-3 Fatty Acids (FISH OIL) 1200 MG CAPS Take 1 capsule by mouth daily.        Marland Kitchen omeprazole (PRILOSEC) 20 MG capsule Take 20 mg by mouth 2 (two) times daily.        . rosuvastatin (CRESTOR) 20 MG tablet Take 10 mg by mouth daily.        Marland Kitchen triamcinolone cream (KENALOG) 0.1 % as needed.      . triamterene-hydrochlorothiazide (MAXZIDE-25) 37.5-25 MG per tablet Take 1 each (1 tablet total) by mouth daily.  30 tablet  6  . valsartan (DIOVAN) 80 MG tablet Take 40 mg by mouth 2 (two) times daily.         Past Medical History  Diagnosis Date  . CAD (coronary artery disease)      CABG 1991, 2004;  IWMI  1998.  S/P PTCA  stensts to SVG ot OM last in 2003.  Marland Kitchen HTN (hypertension)   .  Dyslipidemia   . BPH (benign prostatic hypertrophy)   . Gout   . Rheumatic fever     as a child    Past Surgical History  Procedure Date  . Coronary artery bypass graft     1991 (LIMA to LAD/Diag;  SVG to RCA;  SVG to OM)  . Coronary artery bypass graft     2004 (L radial to PDA; SVG to OM1/ distal LCx)  . Tonsillectomy   . Appendectomy   . Hernia repair     No family history on file.  History   Social History  . Marital Status: Legally Separated    Spouse Name: N/A    Number of Children: N/A  . Years of Education: N/A   Occupational History  . Not on file.   Social History Main Topics  . Smoking status: Former Games developer  . Smokeless tobacco: Not on file  . Alcohol Use: Not on  file  . Drug Use: No  . Sexually Active: Not on file   Other Topics Concern  . Not on file   Social History Narrative  . No narrative on file    Review of Systems:  All systems reviewed.  They are negative to the above problem except as previously stated.  Vital Signs: Ht 6' (1.829 m)  Wt 216 lb (97.977 kg)  BMI 29.29 kg/m2 BP 128/86   P 66  Physical Exam Patient is in NAD HEENT:  Normocephalic, atraumatic. EOMI, PERRLA.  Neck: JVP is normal. No thyromegaly. No bruits.  Lungs: clear to auscultation. No rales no wheezes.  Heart: Regular rate and rhythm. Normal S1, S2. No S3.   No significant murmurs. PMI not displaced.  Abdomen:  Supple, nontender. Normal bowel sounds. No masses. No hepatomegaly.  Extremities:   Good distal pulses throughout. No lower extremity edema.  Musculoskeletal :moving all extremities.  Neuro:   alert and oriented x3.  CN II-XII grossly intact.  EKG:  SR  66 bpm.  Assessment and Plan:  1.  CAD Patient with severe CAD.   Symptoms are concerning for angina.  I would recomm L heart cath.  He would like to follow.   I have asked him to call  If continues with symptoms, again recomm cath. He has NTG to use.   He has not had symptoms at rest  2.  HL   Continue meds.  3.  HTN  Adequate control.

## 2011-12-29 NOTE — Patient Instructions (Signed)
Call in 1 week to let us know how you are feeling.

## 2012-01-05 ENCOUNTER — Telehealth: Payer: Self-pay | Admitting: Internal Medicine

## 2012-01-05 NOTE — Telephone Encounter (Signed)
Called patient back. He is having exertional left sided chest and arm pain on and off. Will set up for left heart cath per Dr.Ross.

## 2012-01-05 NOTE — Telephone Encounter (Signed)
Left heart cath set up for 7/18 830 am arrival for a 930am case with Dr.Stuckey at the [facility]. Patient aware NPO after midnight and to take all medications. He will have lab work completed at the [facility] and will have results faxed to Dr.Ross.

## 2012-01-05 NOTE — Telephone Encounter (Signed)
Pt is calling to give a report on the pains he was having

## 2012-01-06 ENCOUNTER — Other Ambulatory Visit: Payer: Self-pay | Admitting: *Deleted

## 2012-01-06 ENCOUNTER — Encounter: Payer: Self-pay | Admitting: Internal Medicine

## 2012-01-06 MED ORDER — METOPROLOL SUCCINATE ER 50 MG PO TB24: ORAL_TABLET | ORAL | Status: DC

## 2012-01-08 ENCOUNTER — Encounter (HOSPITAL_BASED_OUTPATIENT_CLINIC_OR_DEPARTMENT_OTHER): Payer: Self-pay | Admitting: Cardiology

## 2012-01-08 ENCOUNTER — Encounter (HOSPITAL_BASED_OUTPATIENT_CLINIC_OR_DEPARTMENT_OTHER)
Admission: RE | Disposition: A | Discharge status: Home / Self Care | Payer: Self-pay | Source: Ambulatory Visit | Attending: Cardiology

## 2012-01-08 ENCOUNTER — Inpatient Hospital Stay (HOSPITAL_BASED_OUTPATIENT_CLINIC_OR_DEPARTMENT_OTHER)
Admission: RE | Admit: 2012-01-08 | Discharge: 2012-01-08 | Disposition: A | Discharge status: Home / Self Care | Payer: 59 | Source: Ambulatory Visit | Attending: Cardiology | Admitting: Cardiology

## 2012-01-08 DIAGNOSIS — E785 Hyperlipidemia, unspecified: Secondary | ICD-10-CM | POA: Insufficient documentation

## 2012-01-08 DIAGNOSIS — I209 Angina pectoris, unspecified: Secondary | ICD-10-CM | POA: Insufficient documentation

## 2012-01-08 DIAGNOSIS — I2510 Atherosclerotic heart disease of native coronary artery without angina pectoris: Secondary | ICD-10-CM | POA: Insufficient documentation

## 2012-01-08 DIAGNOSIS — I252 Old myocardial infarction: Secondary | ICD-10-CM | POA: Insufficient documentation

## 2012-01-08 DIAGNOSIS — I25810 Atherosclerosis of coronary artery bypass graft(s) without angina pectoris: Secondary | ICD-10-CM | POA: Insufficient documentation

## 2012-01-08 DIAGNOSIS — I10 Essential (primary) hypertension: Secondary | ICD-10-CM | POA: Insufficient documentation

## 2012-01-08 SURGERY — JV LEFT HEART CATHETERIZATION WITH CORONARY ANGIOGRAM
Anesthesia: Moderate Sedation

## 2012-01-08 MED ORDER — ACETAMINOPHEN 325 MG PO TABS: 650.0000 mg | ORAL_TABLET | ORAL | Status: DC | PRN

## 2012-01-08 MED ORDER — SODIUM CHLORIDE 0.9 % IJ SOLN: 3.0000 mL | INTRAMUSCULAR | Status: DC | PRN

## 2012-01-08 MED ORDER — SODIUM CHLORIDE 0.9 % IV SOLN: 250.0000 mL | INTRAVENOUS | Status: DC | PRN

## 2012-01-08 MED ORDER — SODIUM CHLORIDE 0.9 % IV SOLN: INTRAVENOUS | Status: AC

## 2012-01-08 MED ORDER — ASPIRIN 81 MG PO CHEW
324.0000 mg | CHEWABLE_TABLET | ORAL | Status: AC
  Administered 2012-01-08: 324 mg via ORAL

## 2012-01-08 MED ORDER — ONDANSETRON HCL 4 MG/2ML IJ SOLN: 4.0000 mg | Freq: Four times a day (QID) | INTRAMUSCULAR | Status: DC | PRN

## 2012-01-08 MED ORDER — SODIUM CHLORIDE 0.9 % IJ SOLN: 3.0000 mL | Freq: Two times a day (BID) | INTRAMUSCULAR | Status: DC

## 2012-01-08 MED ORDER — SODIUM CHLORIDE 0.9 % IV SOLN
INTRAVENOUS | Status: DC
  Administered 2012-01-08: 09:00:00 via INTRAVENOUS

## 2012-01-08 NOTE — Progress Notes (Signed)
Bedrest begins @ 1130.  Tegaderm dressing applied to right groin site. Site level 0.

## 2012-01-08 NOTE — Progress Notes (Signed)
Patient ID: Brandon Stone, male   DOB: [birth year], 66 y.o.   MRN: [id number]

## 2012-01-08 NOTE — CV Procedure (Signed)
   Cardiac Catheterization Procedure Note  Name: Brandon Stone MRN: [id number] DOB: [birth year]  Procedure: Left Heart Cath, Selective Coronary Angiography,  SVG and LIMA angio,  LV angiography  Indication: prior CABG times two, with recurrent angina pectoris.     Procedural details: The right groin was prepped, draped, and anesthetized with 1% lidocaine. Using modified Seldinger technique, a 4 French sheath was introduced into the right femoral artery. Standard Judkins catheters were used for coronary angiography and left ventriculography.  A 3DRC was used for native RCA, SVG, and LIMA angio.   Catheter exchanges were performed over a guidewire. There were no immediate procedural complications. The patient was transferred to the post catheterization recovery area for further monitoring.  Procedural Findings: Hemodynamics:  AO 115/63 LV 115/13   Coronary angiography: Coronary dominance: right  Left mainstem: Segmental and diffusely plaqued at about 79%.  Previously stented.  Leads to a ramus which is small and patent.  Also leads in to a severely diseased OM system.  The proximal CFX is segmentally diseased at 60% with a focal area of 80%.  It then opens into an OM that has a long area of 80% disease.  Distally this branch is small.  An old SVG fills retrograde into the first OM1 and the SVG segment is also severely diseased.  The distal OM2 itself is small and diffusely diseased.    Left anterior descending (LAD): Occluded proximally   Left circumflex (LCx): Occluded proximally  LIMA to the LAD is widely patent.  IT appears to be sequential, hooded into a diagonal then to the LAD.  The distal LAD appears severely diseased at the apex. Just after the insertion there is also about 70% narrowing.   Several septals fill.  There may be some CFX collaterals.    SVG to the OM branches is occluded proximally  Left radial graft to PDA is widely patent.   The distal PDA has about 70% narrowing,  but the antegrade and retrograde flow is excellent.  The PLA system is large, mildly plaqued but patent.    Right coronary artery (RCA): severe disease prox and total occlusion.  Left ventriculography: Left ventricular systolic function is reduced with inferior severe hypokinesis.  EF estimate about 45%.    Final Conclusions:   1.  Patent IMA to the diagonal and LAD with diffuse distal LAD disease. 2.  Patent radial to the PDA 3.  Occluded SVG to the OM 4.  Severe sequential disease of the LMCA leading into a diffusely disease native CFX. 5.  Mildly reduced LV overall.    Recommendations:  1.  The distal CFX does not appear very favorable for another redo, and is a poor vessel for intervention.  We can have Dr. Tyrone Sage review the films, but I am skeptical that another procedure will be of help.  Enhancement of medical therapy is probably the best option at this point.  I have reviewed with the patient and his wife.    [name] 01/08/2012, 11:21 AM

## 2012-01-08 NOTE — Interval H&P Note (Signed)
History and Physical Interval Note:  01/08/2012 10:15 AM  Brandon Stone  has presented today for surgery, with the diagnosis of chest pain  The various methods of treatment have been discussed with the patient and family. After consideration of risks, benefits and other options for treatment, the patient has consented to  Procedure(s) (LRB): [facility] LEFT HEART CATHETERIZATION WITH CORONARY ANGIOGRAM (N/A) as a surgical intervention .  The patient's history has been reviewed, patient examined, no change in status, stable for surgery.  I have reviewed the patients' chart and labs.  Questions were answered to the patient's satisfaction.     [name]

## 2012-01-08 NOTE — H&P (View-Only) (Signed)
Patient ID: Brandon Stone, male   DOB: 07/22/1945, 65 y.o.   MRN: [id number]  

## 2012-01-15 ENCOUNTER — Encounter: Payer: Self-pay | Admitting: Nurse Practitioner

## 2012-01-15 ENCOUNTER — Ambulatory Visit (INDEPENDENT_AMBULATORY_CARE_PROVIDER_SITE_OTHER): Payer: 59 | Admitting: Nurse Practitioner

## 2012-01-15 VITALS — BP 110/78 | HR 65 | Ht 72.0 in | Wt 217.0 lb

## 2012-01-15 DIAGNOSIS — I208 Other forms of angina pectoris: Secondary | ICD-10-CM

## 2012-01-15 DIAGNOSIS — I209 Angina pectoris, unspecified: Secondary | ICD-10-CM

## 2012-01-15 DIAGNOSIS — I2510 Atherosclerotic heart disease of native coronary artery without angina pectoris: Secondary | ICD-10-CM

## 2012-01-15 DIAGNOSIS — I10 Essential (primary) hypertension: Secondary | ICD-10-CM

## 2012-01-15 DIAGNOSIS — E785 Hyperlipidemia, unspecified: Secondary | ICD-10-CM

## 2012-01-15 MED ORDER — ISOSORBIDE MONONITRATE ER 30 MG PO TB24: 30.0000 mg | ORAL_TABLET | Freq: Every day | ORAL | Status: DC

## 2012-01-15 NOTE — Patient Instructions (Addendum)
Your physician has recommended you make the following change in your medication: Start Imdur 30 mg, take one tablet each day  Your physician recommends that you schedule a follow-up appointment in: 2 weeks with Dr. Tenny Craw, or  Tereso Newcomer, PA-C

## 2012-01-15 NOTE — Progress Notes (Signed)
Patient Name: Brandon Stone Date of Encounter: 01/15/2012  Primary Care Provider:  Lucila Maine, MD Primary Cardiologist:  Lovina Reach, MD  Patient Profile  66 y/o male with h/o CAD s/p cabg and redo cabg who presents for f/u after recent cath.  Problem List   Past Medical History  Diagnosis Date  . CAD (coronary artery disease)      a. CABG 1991, redo in 2004;  b.  IWMI  1998;  c.  S/P PTCA  stents to SVG ot OM last in 2003;  d.  Redo CABG x 2 in 2004 (VG->OM->LCX, LRA->PDA);  e. 12/2011 Cath: 3vd, sev LM into LCX dzs, VG->OM 100, LIMA->DIAG->LAD patent, Rad Art->PDA patent, EF 45%, Med Rx.  . HTN (hypertension)   . Dyslipidemia   . BPH (benign prostatic hypertrophy)   . Gout   . Rheumatic fever     as a child   Past Surgical History  Procedure Date  . Coronary artery bypass graft     1991 (LIMA to LAD/Diag;  SVG to RCA;  SVG to OM)  . Coronary artery bypass graft     2004 (L radial to PDA; SVG to OM1/ distal LCx)  . Tonsillectomy   . Appendectomy   . Hernia repair     Allergies  Allergies  Allergen Reactions  . Codeine   . Other     Intolerance to strong pain medications  . Ticlopidine Hcl     HPI  66 y/o male with the above problem list.  He was recently seen in clinic by Dr. Tenny Craw and set up for outpt cath 2/2 exertional chest and left arm pain.  Cath showed severe 3VD including significant LM into LCX dzs, with VG->OM->dist LCX stenosis.  The LIMA->LAD and Radial Artery->PDA were patent.  The LCX distribution was felt to be the likely cause of his Ss however was not felt to be amenable to PCI.  Med Rx was recommended.  Since cath, pt has cont to have exertional chest and left arm tightness sometimes associated with dyspnea.  He notes that discomfort is coming on with walking of less than 5 minutes.  Ss resolve in a few mins with rest and he has not had to take SL NTG.  He denies pnd, orthopnea, n, v, dizziness, syncope, edema, weight gain, or early satiety.   Home  Medications  Prior to Admission medications   Medication Sig Start Date End Date Taking? Authorizing Provider  allopurinol (ZYLOPRIM) 300 MG tablet Take 300 mg by mouth daily.     Yes Historical Provider, MD  amLODipine (NORVASC) 5 MG tablet TAKE 1 TABLET BY MOUTH EVERY DAY 12/14/11  Yes Pricilla Riffle, MD  aspirin 81 MG tablet Take 81 mg by mouth daily.     Yes Historical Provider, MD  clopidogrel (PLAVIX) 75 MG tablet Take 75 mg by mouth daily.     Yes Historical Provider, MD  KLOR-CON M20 20 MEQ tablet TAKE 1 TABLET BY MOUTH EVERY DAY 06/19/11  Yes Pricilla Riffle, MD  metoprolol succinate (TOPROL-XL) 50 MG 24 hr tablet Take 1/2 tab twice a day 01/06/12  Yes Pricilla Riffle, MD  Multiple Vitamin (MULTIVITAMIN) capsule Take 1 capsule by mouth daily.     Yes Historical Provider, MD  Omega-3 Fatty Acids (FISH OIL) 1200 MG CAPS Take 1 capsule by mouth daily.     Yes Historical Provider, MD  omeprazole (PRILOSEC) 20 MG capsule Take 20 mg by mouth 2 (two) times  daily.     Yes Historical Provider, MD  rosuvastatin (CRESTOR) 20 MG tablet Take 10 mg by mouth daily.    Yes Historical Provider, MD  triamcinolone cream (KENALOG) 0.1 % as needed. 12/04/11  Yes Historical Provider, MD  triamterene-hydrochlorothiazide (MAXZIDE-25) 37.5-25 MG per tablet Take 1 each (1 tablet total) by mouth daily. 07/11/11  Yes Pricilla Riffle, MD  valsartan (DIOVAN) 80 MG tablet Take 40 mg by mouth 2 (two) times daily.    Yes Historical Provider, MD  isosorbide mononitrate (IMDUR) 30 MG 24 hr tablet Take 1 tablet (30 mg total) by mouth daily. 01/15/12 01/14/13  Ok Anis, NP    Review of Systems  Exertional c/p as outlined.  All other systems reviewed and are otherwise negative except as noted above.  Physical Exam  Blood pressure 110/78, pulse 65, height 6' (1.829 m), weight 217 lb (98.431 kg).  General: Pleasant, NAD Psych: Normal affect. Neuro: Alert and oriented X 3. Moves all extremities spontaneously. HEENT:  Normal  Neck: Supple without bruits or JVD. Lungs:  Resp regular and unlabored, CTA. Heart: RRR no s3, s4, or murmurs. Abdomen: Soft, non-tender, non-distended, BS + x 4.  Extremities: No clubbing, cyanosis or edema. DP/PT/Radials 2+ and equal bilaterally.  Right groin w/o b/b/h.  Accessory Clinical Findings  ECG - rsr, ivcd, inf q's, no acute changes.  Assessment & Plan  1.  Stable Angina/CAD:  S/p recent cath revealing severe, unprotected LM into LCX dzs.  This was not felt to be amenable to PCI.  Med Rx recommended.  I will Rx Imdur 30mg  daily.  We had a long discussion about medical mgmt of symptomatic coronary dzs and the role of nitrates.  I also advised that I would touch base with Drs. Stuckey and Kincheloe as it was implied in the cath note that films were to be reviewed by Dr. Tyrone Sage re: ? Redo.  Pt is interested in any option for his symptoms.  I will have him f/u in 2 wks to keep close tabs on his Ss and adjust nitrates if necessary.  2.  HTN:  Stable.  3.  HL:  Cont statin therapy.   Nicolasa Ducking, NP 01/15/2012, 3:10 PM

## 2012-01-19 ENCOUNTER — Telehealth: Payer: Self-pay | Admitting: Cardiothoracic Surgery

## 2012-01-25 ENCOUNTER — Other Ambulatory Visit: Payer: Self-pay | Admitting: Internal Medicine

## 2012-01-26 ENCOUNTER — Encounter: Payer: Self-pay | Admitting: Physician Assistant

## 2012-01-26 ENCOUNTER — Ambulatory Visit (INDEPENDENT_AMBULATORY_CARE_PROVIDER_SITE_OTHER): Payer: 59 | Admitting: Physician Assistant

## 2012-01-26 VITALS — BP 129/87 | HR 65 | Ht 76.0 in | Wt 218.0 lb

## 2012-01-26 DIAGNOSIS — I10 Essential (primary) hypertension: Secondary | ICD-10-CM

## 2012-01-26 DIAGNOSIS — I209 Angina pectoris, unspecified: Secondary | ICD-10-CM

## 2012-01-26 DIAGNOSIS — I2089 Other forms of angina pectoris: Secondary | ICD-10-CM | POA: Insufficient documentation

## 2012-01-26 DIAGNOSIS — R079 Chest pain, unspecified: Secondary | ICD-10-CM

## 2012-01-26 DIAGNOSIS — I208 Other forms of angina pectoris: Secondary | ICD-10-CM

## 2012-01-26 DIAGNOSIS — I2510 Atherosclerotic heart disease of native coronary artery without angina pectoris: Secondary | ICD-10-CM

## 2012-01-26 DIAGNOSIS — E785 Hyperlipidemia, unspecified: Secondary | ICD-10-CM

## 2012-01-26 MED ORDER — ISOSORBIDE MONONITRATE ER 60 MG PO TB24: 60.0000 mg | ORAL_TABLET | Freq: Every day | ORAL | Status: DC

## 2012-01-26 NOTE — Patient Instructions (Addendum)
Your physician has recommended you make the following change in your medication: Isosorbide increased to (60mg ) daily  Your physician recommends that you schedule a follow-up appointment in: Two weeks with our PA, Tereso Newcomer or Dr. Tenny Craw than in 6-8 weeks with Dr. Tenny Craw

## 2012-01-26 NOTE — Progress Notes (Signed)
[address] [place], [place]  [address] Phone: [phone] Fax:  [phone]  Date:  01/26/2012   Name:  Tadeusz Stahl   DOB:  08/18/1945   MRN:  [id number]  PCP:  Lucila Maine, MD  Primary Cardiologist:  Dr. Dietrich Pates  Primary Electrophysiologist:  None    History of Present Illness: Brandon Stone is a 66 y.o. male who returns for follow up.   He has a history of CAD,HTN, dyslipidemia. (s/p CABG 1991. IWMI 1998. Redo CABG in 2004 (L radial to PDA; SVG to OM1/distal LCx).  He was seen in the office by Dr. Tenny Craw 01/08/12. He was having symptoms that were suspicious for angina.   LHC 01/08/12: LM with segmental and diffuse plaque 79%-previously stented- leading into the ramus, pCFX 60% with focal 80% and then occluded, OM 80%, pLAD occluded, LIMA-LAD patent, 70% in the LAD after the insertion, SVG-OM occluded, left radial to PDA patent, dPDA 70%, RCA with severe disease proximal and total occlusion, EF 45%. As noted, he has severe sequential disease in the LM leading into a diffusely diseased native circumflex. The circumflex did not appear favorable for another redo bypass and is a poor vessel for intervention.  Dr.  [name] is to discuss with Dr. Tyrone Sage to see if redo CABG an option.    He saw Nicolasa Ducking, NP on 7/25 in follow up. He continued to have chest pain and left arm pain felt to represent angina. He was placed on isosorbide 30 mg a day. I talked to Dr. Tenny Craw and Dr. Riley Kill. He is not a candidate for PCI of the circumflex or redo bypass. The patient actually met with Dr. Riley Kill today who reviewed his cardiac catheterization films with him and why the decision was to treat medically. He continues to have chest discomfort and arm discomfort. He describes class III angina. Overall, there is some improvement with isosorbide. He denies syncope. He denies chest pain at rest. He denies orthopnea, PND or edema.  Wt Readings from Last 3 Encounters:    01/15/12 217 lb (98.431 kg)  01/08/12 216 lb (97.977 kg)  01/08/12 216 lb (97.977 kg)     Past Medical History  Diagnosis Date  . CAD (coronary artery disease)      a. CABG 1991, redo in 2004;  b.  IWMI  1998;  c.  S/P PTCA  stents to SVG ot OM last in 2003;  d.  Redo CABG x 2 in 2004 (VG->OM->LCX, LRA->PDA);  e. 12/2011 Cath: 3vd, sev LM into LCX dzs, VG->OM 100, LIMA->DIAG->LAD patent, Rad Art->PDA patent, EF 45%, Med Rx.  . HTN (hypertension)   . Dyslipidemia   . BPH (benign prostatic hypertrophy)   . Gout   . Rheumatic fever     as a child    Current Outpatient Prescriptions  Medication Sig Dispense Refill  . allopurinol (ZYLOPRIM) 300 MG tablet Take 300 mg by mouth daily.        Marland Kitchen amLODipine (NORVASC) 5 MG tablet TAKE 1 TABLET BY MOUTH EVERY DAY  30 tablet  6  . aspirin 81 MG tablet Take 81 mg by mouth daily.        . clopidogrel (PLAVIX) 75 MG tablet Take 75 mg by mouth daily.        . isosorbide mononitrate (IMDUR) 30 MG 24 hr tablet Take 1 tablet (30 mg total) by mouth daily.  30 tablet  6  . KLOR-CON M20 20 MEQ tablet TAKE  1 TABLET BY MOUTH EVERY DAY  30 tablet  6  . metoprolol succinate (TOPROL-XL) 50 MG 24 hr tablet Take 1/2 tab twice a day  30 tablet  9  . Multiple Vitamin (MULTIVITAMIN) capsule Take 1 capsule by mouth daily.        . Omega-3 Fatty Acids (FISH OIL) 1200 MG CAPS Take 1 capsule by mouth daily.        Marland Kitchen omeprazole (PRILOSEC) 20 MG capsule Take 20 mg by mouth 2 (two) times daily.        . rosuvastatin (CRESTOR) 20 MG tablet Take 10 mg by mouth daily.       Marland Kitchen triamcinolone cream (KENALOG) 0.1 % as needed.      . triamterene-hydrochlorothiazide (MAXZIDE-25) 37.5-25 MG per tablet Take 1 each (1 tablet total) by mouth daily.  30 tablet  6  . valsartan (DIOVAN) 80 MG tablet Take 40 mg by mouth 2 (two) times daily.         Allergies: Allergies  Allergen Reactions  . Codeine   . Other     Intolerance to strong pain medications  . Ticlopidine Hcl      History  Substance Use Topics  . Smoking status: Former Games developer  . Smokeless tobacco: Not on file  . Alcohol Use: Not on file     ROS:  Please see the history of present illness.    All other systems reviewed and negative.   PHYSICAL EXAM: VS:  BP 129/87  Pulse 65  Ht 6\' 4"  (1.93 m)  Wt 218 lb (98.884 kg)  BMI 26.54 kg/m2 Well nourished, well developed, in no acute distress HEENT: normal Neck: no JVD Cardiac:  normal S1, S2; RRR; no murmur Lungs:  clear to auscultation bilaterally, no wheezing, rhonchi or rales Abd: soft, nontender, no hepatomegaly Ext: no edema Skin: warm and dry Neuro:  CNs 2-12 intact, no focal abnormalities noted  EKG:  Sinus rhythm, heart rate 64, normal axis, inferior Q waves, nonspecific ST-T wave changes   ASSESSMENT AND PLAN:  1. Stable Angina As noted, I reviewed his case today with Dr. Riley Kill who also met with the patient. We will continue to advance medical therapy. His blood pressure is high enough to continue to push his antianginals. I will start by increasing his isosorbide to 60 mg a day. He'll be brought back in followup in the next 2-3 weeks. If he still having symptoms we can consider to increase his amlodipine or start him on Ranexa.  2. CAD Continue medical therapy as noted. Continue statin. Arrange followup with Dr. Tenny Craw in 6-8 weeks.  3. Hypertension Continue current therapy.  4. Hyperlipidemia Continue current therapy.   Signed, Tereso Newcomer, PA-C  3:32 PM 01/26/2012

## 2012-01-27 NOTE — Addendum Note (Signed)
Addended by: Micki Riley C on: 01/27/2012 04:06 PM   Modules accepted: Orders

## 2012-02-09 ENCOUNTER — Encounter: Payer: Self-pay | Admitting: Nurse Practitioner

## 2012-02-09 ENCOUNTER — Ambulatory Visit (INDEPENDENT_AMBULATORY_CARE_PROVIDER_SITE_OTHER): Payer: 59 | Admitting: Nurse Practitioner

## 2012-02-09 VITALS — BP 138/82 | HR 65 | Ht 72.0 in | Wt 220.4 lb

## 2012-02-09 DIAGNOSIS — I209 Angina pectoris, unspecified: Secondary | ICD-10-CM

## 2012-02-09 DIAGNOSIS — I10 Essential (primary) hypertension: Secondary | ICD-10-CM

## 2012-02-09 DIAGNOSIS — E785 Hyperlipidemia, unspecified: Secondary | ICD-10-CM

## 2012-02-09 DIAGNOSIS — I2510 Atherosclerotic heart disease of native coronary artery without angina pectoris: Secondary | ICD-10-CM

## 2012-02-09 DIAGNOSIS — I208 Other forms of angina pectoris: Secondary | ICD-10-CM

## 2012-02-09 MED ORDER — AMLODIPINE BESYLATE 10 MG PO TABS: 10.0000 mg | ORAL_TABLET | Freq: Every day | ORAL | Status: DC

## 2012-02-09 NOTE — Patient Instructions (Signed)
Your physician recommends that you schedule a follow-up appointment in: 6 weeks Your physician has recommended you make the following change in your medication: INCREASE Amlodipine 10 mg daily

## 2012-02-09 NOTE — Progress Notes (Signed)
Patient Name: Brandon Stone Date of Encounter: 02/09/2012  Primary Care Provider:  Cornelius Moras, MD Primary Cardiologist:  Lovina Reach, MD  Patient Profile  66 y/o male with h/o CAD, CABG & redo CABG, who presents for f/u.  Problem List   Past Medical History  Diagnosis Date  . CAD (coronary artery disease)      a. CABG 1991;  b.  IWMI  1998;  c.  S/P PTCA  stents to SVG ot OM last in 2003;  d.  Redo CABG x 2 in 2004 (VG->OM->LCX, LRA->PDA);  e. 12/2011 Cath: 3vd, sev LM into LCX dzs, VG->OM 100, LIMA->DIAG->LAD patent, Rad Art->PDA patent, EF 45%, Med Rx.  . HTN (hypertension)   . Dyslipidemia   . BPH (benign prostatic hypertrophy)   . Gout   . Rheumatic fever     as a child   Past Surgical History  Procedure Date  . Coronary artery bypass graft     1991 (LIMA to LAD/Diag;  SVG to RCA;  SVG to OM)  . Coronary artery bypass graft     2004 (L radial to PDA; SVG to OM1/ distal LCx)  . Tonsillectomy   . Appendectomy   . Hernia repair     Allergies  Allergies  Allergen Reactions  . Codeine   . Other     Intolerance to strong pain medications  . Ticlopidine Hcl     HPI  66 y/o male with the above complex problem list and coronary anatomy.  I last saw him in clinic in July at which time he was having significant exertional chest pain.  Following that visit, his films were extensively reviewed by Drs. Stuckey & Tyrone Sage, and it was ultimately determined that he was neither a good candidate for PCI nor another redo CABG.  He was seen back roughly 2 wks ago and his Imdur was titrated to 60mg  daily.  Since then, he has definitely noted some increase in his exercise tolerance though he continues to note angina with longer durations of activity.  He is back @ work and has been able to work on machinery w/o chest pain but when he returns home from work and walks out to his mailbox to get the mail, he may experience mild chest pressure that resolves with rest.  Overall, he is pleased with  his progress, though wishes it would be faster.  He denies pnd, orthopnea, n, v, dizziness, syncope, edema, weight gain, or early satiety.  Home Medications  Prior to Admission medications   Medication Sig Start Date End Date Taking? Authorizing Provider  allopurinol (ZYLOPRIM) 300 MG tablet Take 300 mg by mouth daily.     Yes Historical Provider, MD  amLODipine (NORVASC) 10 MG tablet Take 1 tablet (10 mg total) by mouth daily. 02/09/12  Yes Ok Anis, NP  aspirin 81 MG tablet Take 81 mg by mouth daily.     Yes Historical Provider, MD  clopidogrel (PLAVIX) 75 MG tablet Take 75 mg by mouth daily.     Yes Historical Provider, MD  isosorbide mononitrate (IMDUR) 60 MG 24 hr tablet Take 1 tablet (60 mg total) by mouth daily. Take one tablet daily (60mg ) 01/26/12 01/25/13 Yes Beatrice Lecher, PA  KLOR-CON M20 20 MEQ tablet TAKE 1 TABLET BY MOUTH EVERY DAY 01/25/12  Yes Pricilla Riffle, MD  metoprolol succinate (TOPROL-XL) 50 MG 24 hr tablet Take 1/2 tab twice a day 01/06/12  Yes Pricilla Riffle, MD  Multiple Vitamin (  MULTIVITAMIN) capsule Take 1 capsule by mouth daily.     Yes Historical Provider, MD  Omega-3 Fatty Acids (FISH OIL) 1200 MG CAPS Take 1 capsule by mouth daily.     Yes Historical Provider, MD  omeprazole (PRILOSEC) 20 MG capsule Take 20 mg by mouth 2 (two) times daily.     Yes Historical Provider, MD  rosuvastatin (CRESTOR) 20 MG tablet Take 10 mg by mouth daily.    Yes Historical Provider, MD  triamcinolone cream (KENALOG) 0.1 % as needed. 12/04/11  Yes Historical Provider, MD  triamterene-hydrochlorothiazide (MAXZIDE-25) 37.5-25 MG per tablet Take 1 each (1 tablet total) by mouth daily. 07/11/11  Yes Pricilla Riffle, MD  valsartan (DIOVAN) 80 MG tablet Take 40 mg by mouth 2 (two) times daily.    Yes Historical Provider, MD    Review of Systems  All other systems reviewed and are otherwise negative except as noted above.  Physical Exam  Blood pressure 138/82, pulse 65, height 6' (1.829 m),  weight 220 lb 6.4 oz (99.973 kg), SpO2 97.00%.  General: Pleasant, NAD Psych: Normal affect. Neuro: Alert and oriented X 3. Moves all extremities spontaneously. HEENT: Normal  Neck: Supple without bruits or JVD. Lungs:  Resp regular and unlabored, CTA. Heart: RRR no s3, s4, or murmurs. Abdomen: Soft, non-tender, non-distended, BS + x 4.  Extremities: No clubbing, cyanosis or edema. DP/PT/Radials 2+ and equal bilaterally.  Assessment & Plan  1.  Stable Angina/CAD:  Overall, pt is doing better but does continue to have exertional angina with longer durations of activity.  His BP is slightly elevated today and I'm going to titrate his norvasc to 10mg  daily.  I'm going to leave his nitrate and bb @ their current doses for now.  I've advised him to call us if he isn't seeing significant improvement following ccb titration, at which point we can push his nitrate further.  This can likely be carried out over the phone.  I'll bring him back to clinic in 6 wks to either see me or Dr. Tenny Craw.  Cont asa, statin, bb, arb, plavix.  2.  HTN:  titrating CCB as above.   3.  HL:  Cont crestor.  4.  Dispo:  F/u in 6 wks.  Nicolasa Ducking, NP 02/09/2012, 4:21 PM

## 2012-02-15 ENCOUNTER — Other Ambulatory Visit: Payer: Self-pay | Admitting: Internal Medicine

## 2012-03-16 ENCOUNTER — Encounter: Payer: Self-pay | Admitting: Nurse Practitioner

## 2012-03-16 ENCOUNTER — Ambulatory Visit (INDEPENDENT_AMBULATORY_CARE_PROVIDER_SITE_OTHER): Payer: 59 | Admitting: Nurse Practitioner

## 2012-03-16 VITALS — BP 122/70 | HR 69 | Ht 72.0 in | Wt 220.0 lb

## 2012-03-16 DIAGNOSIS — I10 Essential (primary) hypertension: Secondary | ICD-10-CM

## 2012-03-16 DIAGNOSIS — I209 Angina pectoris, unspecified: Secondary | ICD-10-CM

## 2012-03-16 DIAGNOSIS — E785 Hyperlipidemia, unspecified: Secondary | ICD-10-CM

## 2012-03-16 DIAGNOSIS — I2089 Other forms of angina pectoris: Secondary | ICD-10-CM

## 2012-03-16 DIAGNOSIS — I208 Other forms of angina pectoris: Secondary | ICD-10-CM

## 2012-03-16 DIAGNOSIS — I25810 Atherosclerosis of coronary artery bypass graft(s) without angina pectoris: Secondary | ICD-10-CM

## 2012-03-16 NOTE — Patient Instructions (Addendum)
Your physician recommends that you continue on your current medications as directed. Please refer to the Current Medication list given to you today.  Your physician recommends that you schedule a follow-up appointment in: 3 months  

## 2012-03-16 NOTE — Progress Notes (Signed)
Patient Name: Brandon Stone Date of Encounter: 03/16/2012  Primary Care Provider:  Cornelius Moras Primary Cardiologist:  Lovina Reach, MD  Patient Profile  66 y/o male with h/o CAD who presents for f/u.  Problem List   Past Medical History  Diagnosis Date  . CAD (coronary artery disease)      a. CABG 1991;  b.  IWMI  1998;  c.  S/P PTCA  stents to SVG ot OM last in 2003;  d.  Redo CABG x 2 in 2004 (VG->OM->LCX, LRA->PDA);  e. 12/2011 Cath: 3vd, sev LM into LCX dzs, VG->OM 100, LIMA->DIAG->LAD patent, Rad Art->PDA patent, EF 45%, Med Rx.  . HTN (hypertension)   . Dyslipidemia   . BPH (benign prostatic hypertrophy)   . Gout   . Rheumatic fever     as a child   Past Surgical History  Procedure Date  . Coronary artery bypass graft     1991 (LIMA to LAD/Diag;  SVG to RCA;  SVG to OM)  . Coronary artery bypass graft     2004 (L radial to PDA; SVG to OM1/ distal LCx)  . Tonsillectomy   . Appendectomy   . Hernia repair     Allergies  Allergies  Allergen Reactions  . Codeine   . Other     Intolerance to strong pain medications  . Ticlopidine Hcl     HPI  66 year old male with above problem list.  I last saw him about 6 weeks ago.  Since then, he he has continued to do progressively better with less and less angina though he still has occasional dyspnea on exertion.  When I last saw him his blood pressure was elevated and we adjusted his amlodipine.  He has been following his blood pressure religiously at-home and he has been running in the 120s to 130s.  He is back at work and generally doing well.  He has not had any chest pain at work.  He did have an episode of chest discomfort after loading 15, 40 pound bags of mulch and soil into his car a week or so ago.  This lasted a few minutes and resolved with rest.  He has started going for longer walks and has been able to complete them without angina.  Home Medications  Prior to Admission medications   Medication Sig Start Date End Date  Taking? Authorizing Provider  allopurinol (ZYLOPRIM) 300 MG tablet Take 300 mg by mouth daily.     Yes Historical Provider, MD  amLODipine (NORVASC) 10 MG tablet Take 1 tablet (10 mg total) by mouth daily. 02/09/12  Yes Ok Anis, NP  aspirin 81 MG tablet Take 81 mg by mouth daily.     Yes Historical Provider, MD  clopidogrel (PLAVIX) 75 MG tablet Take 75 mg by mouth daily.     Yes Historical Provider, MD  isosorbide mononitrate (IMDUR) 60 MG 24 hr tablet Take 1 tablet (60 mg total) by mouth daily. Take one tablet daily (60mg ) 01/26/12 01/25/13 Yes Beatrice Lecher, PA  KLOR-CON M20 20 MEQ tablet TAKE 1 TABLET BY MOUTH EVERY DAY 01/25/12  Yes Pricilla Riffle, MD  metoprolol succinate (TOPROL-XL) 50 MG 24 hr tablet Take 1/2 tab twice a day 01/06/12  Yes Pricilla Riffle, MD  Multiple Vitamin (MULTIVITAMIN) capsule Take 1 capsule by mouth daily.     Yes Historical Provider, MD  Omega-3 Fatty Acids (FISH OIL) 1200 MG CAPS Take 1 capsule by mouth daily.  Yes Historical Provider, MD  omeprazole (PRILOSEC) 20 MG capsule Take 20 mg by mouth 2 (two) times daily.     Yes Historical Provider, MD  rosuvastatin (CRESTOR) 20 MG tablet Take 10 mg by mouth daily.    Yes Historical Provider, MD  triamcinolone cream (KENALOG) 0.1 % as needed. 12/04/11  Yes Historical Provider, MD  triamterene-hydrochlorothiazide (MAXZIDE-25) 37.5-25 MG per tablet TAKE 1 TABLET BY MOUTH EVERY DAY 02/15/12  Yes Pricilla Riffle, MD  valsartan (DIOVAN) 80 MG tablet Take 40 mg by mouth 2 (two) times daily.    Yes Historical Provider, MD    Review of Systems  As above, is having significantly less chest discomfort.  He has had some dyspnea on exertion and intermittent basis.  He denies pnd, orthopnea, n, v, dizziness, syncope, edema, weight gain, or early satiety. All other systems reviewed and are otherwise negative except as noted above.  Physical Exam  Blood pressure 122/70, pulse 69, height 6' (1.829 m), weight 220 lb (99.791 kg), SpO2  95.00%.  General: Pleasant, NAD Psych: Normal affect. Neuro: Alert and oriented X 3. Moves all extremities spontaneously. HEENT: Normal  Neck: Supple without bruits or JVD. Lungs:  Resp regular and unlabored, CTA. Heart: RRR no s3, s4, or murmurs. Abdomen: Soft, non-tender, non-distended, BS + x 4.  Extremities: No clubbing, cyanosis or edema. DP/PT/Radials 2+ and equal bilaterally.  Assessment & Plan  1.  Stable angina/coronary artery disease: Patient is really doing much better.  His blood pressure is much better controlled and his exercise tolerance is increasing.  He has had some dyspnea on exertion over the past week although it occurred at work, he did not have any when he went kayaking or for a walk.  I've asked him to keep close tabs on a will not adjust any of his medications at this time.  His weight has been stable at 220 pounds.  He will continue aspirin, Plavix, beta blocker, statin, nitrate, and calcium channel blocker therapy.  2.  Hypertension: Improved with further titration of calcium channel blocker.  Continue current regimen.  3.  Hyperlipidemia: Continue Crestor.    4.Disposition: Followup with Dr. Tenny Craw in 3 months.   Nicolasa Ducking, NP 03/16/2012, 4:32 PM

## 2012-04-23 ENCOUNTER — Ambulatory Visit: Payer: 59 | Admitting: Internal Medicine

## 2012-06-21 ENCOUNTER — Ambulatory Visit (INDEPENDENT_AMBULATORY_CARE_PROVIDER_SITE_OTHER): Payer: 59 | Admitting: Internal Medicine

## 2012-06-21 ENCOUNTER — Encounter: Payer: Self-pay | Admitting: Internal Medicine

## 2012-06-21 VITALS — BP 138/86 | HR 67 | Ht 72.0 in | Wt 221.0 lb

## 2012-06-21 DIAGNOSIS — I10 Essential (primary) hypertension: Secondary | ICD-10-CM

## 2012-06-21 DIAGNOSIS — I25810 Atherosclerosis of coronary artery bypass graft(s) without angina pectoris: Secondary | ICD-10-CM

## 2012-06-21 DIAGNOSIS — E785 Hyperlipidemia, unspecified: Secondary | ICD-10-CM

## 2012-06-21 NOTE — Progress Notes (Signed)
HPI Patient is a 66 yo with a history of CAD (s/p CABG in 55; IWMI in 1998.  Redo CABG in 2004 (SVG to OM/LCx; LRA to PDA).  Cath in July 2013 due to increased CP showed severe LM disease into Lcx.; SVG to OM 100%; LIMA to Diag and LAD patent; Radial Artery to PDA patent   LVEF 45%  Plan was to continue medical Rx. He was lst in clinic in September when he was seen by Flavia Shipper. Since seen he has done well from a cardiac standpoint.  Breathing is OK  No signif chest pain.  He has cut back on walking some due to weather.   Wants to get back to routine. 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  Allergies  Allergen Reactions  . Codeine   . Other     Intolerance to strong pain medications  . Ticlopidine Hcl     Current Outpatient Prescriptions  Medication Sig Dispense Refill  . allopurinol (ZYLOPRIM) 300 MG tablet Take 300 mg by mouth daily.        Marland Kitchen amLODipine (NORVASC) 10 MG tablet Take 1 tablet (10 mg total) by mouth daily.  90 tablet  1  . aspirin 81 MG tablet Take 81 mg by mouth daily.        . clopidogrel (PLAVIX) 75 MG tablet Take 75 mg by mouth daily.        . isosorbide mononitrate (IMDUR) 60 MG 24 hr tablet Take 1 tablet (60 mg total) by mouth daily. Take one tablet daily (60mg )  30 tablet  11  . KLOR-CON M20 20 MEQ tablet TAKE 1 TABLET BY MOUTH EVERY DAY  30 tablet  6  . metoprolol succinate (TOPROL-XL) 50 MG 24 hr tablet Take 1/2 tab twice a day  30 tablet  9  . Multiple Vitamin (MULTIVITAMIN) capsule Take 1 capsule by mouth daily.        . Omega-3 Fatty Acids (FISH OIL) 1200 MG CAPS Take 1 capsule by mouth daily.        Marland Kitchen omeprazole (PRILOSEC) 20 MG capsule Take 20 mg by mouth 2 (two) times daily.        . rosuvastatin (CRESTOR) 20 MG tablet Take 10 mg by mouth daily.       Marland Kitchen triamcinolone cream (KENALOG) 0.1 % as needed.      . triamterene-hydrochlorothiazide (MAXZIDE-25) 37.5-25 MG per tablet TAKE 1 TABLET BY MOUTH EVERY DAY  30 tablet  6  . valsartan  (DIOVAN) 80 MG tablet Take 40 mg by mouth 2 (two) times daily.         Past Medical History  Diagnosis Date  . CAD (coronary artery disease)      a. CABG 1991;  b.  IWMI  1998;  c.  S/P PTCA  stents to SVG ot OM last in 2003;  d.  Redo CABG x 2 in 2004 (VG->OM->LCX, LRA->PDA);  e. 12/2011 Cath: 3vd, sev LM into LCX dzs, VG->OM 100, LIMA->DIAG->LAD patent, Rad Art->PDA patent, EF 45%, Med Rx.  . HTN (hypertension)   . Dyslipidemia   . BPH (benign prostatic hypertrophy)   . Gout   . Rheumatic fever     as a child    Past Surgical History  Procedure Date  . Coronary artery bypass graft     1991 (LIMA to LAD/Diag;  SVG to RCA;  SVG to OM)  . Coronary artery bypass graft     2004 (L  radial to PDA; SVG to OM1/ distal LCx)  . Tonsillectomy   . Appendectomy   . Hernia repair     No family history on file.  History   Social History  . Marital Status: Legally Separated    Spouse Name: N/A    Number of Children: N/A  . Years of Education: N/A   Occupational History  . Not on file.   Social History Main Topics  . Smoking status: Former Games developer  . Smokeless tobacco: Never Used  . Alcohol Use: Not on file  . Drug Use: No  . Sexually Active: Not on file   Other Topics Concern  . Not on file   Social History Narrative  . No narrative on file    Review of Systems:  All systems reviewed.  They are negative to the above problem except as previously stated.  Vital Signs: BP 138/86  Pulse 67  Ht 6' (1.829 m)  Wt 221 lb (100.245 kg)  BMI 29.97 kg/m2  SpO2 96%  Physical Exam Patient is in NAD HEENT:  Normocephalic, atraumatic. EOMI, PERRLA.  Neck: JVP is normal.  No bruits.  Lungs: clear to auscultation. No rales no wheezes.  Heart: Regular rate and rhythm. Normal S1, S2. No S3.   No significant murmurs. PMI not displaced.  Abdomen:  Supple, nontender. Normal bowel sounds. No masses. No hepatomegaly.  Extremities:   Good distal pulses throughout. No lower extremity edema.   Musculoskeletal :moving all extremities.  Neuro:   alert and oriented x3.  CN II-XII grossly intact.   Assessment and Plan:  1.  CAD.  Doing well  No symptoms of angina.  I would keep on same regimen  2.  BP  Adequate control  3.  HL   Keep on statin.  I encouraged him to stay acitve.  Will f/u in fall.

## 2012-08-10 ENCOUNTER — Other Ambulatory Visit: Payer: Self-pay | Admitting: Nurse Practitioner

## 2012-08-26 ENCOUNTER — Encounter: Payer: Self-pay | Admitting: Internal Medicine

## 2012-08-29 ENCOUNTER — Other Ambulatory Visit: Payer: Self-pay | Admitting: Internal Medicine

## 2012-09-09 ENCOUNTER — Telehealth: Payer: Self-pay | Admitting: Internal Medicine

## 2012-09-09 NOTE — Telephone Encounter (Signed)
New problem    Pt want to know if you received a copy of the Lab work from [facility] hospital in [place]. Please call pt to let him know.

## 2012-09-09 NOTE — Telephone Encounter (Signed)
Labs in chart

## 2012-09-13 ENCOUNTER — Other Ambulatory Visit: Payer: Self-pay | Admitting: Internal Medicine

## 2012-09-15 ENCOUNTER — Other Ambulatory Visit: Payer: Self-pay | Admitting: Cardiology

## 2012-09-15 MED ORDER — POTASSIUM CHLORIDE CRYS ER 20 MEQ PO TBCR: 20.0000 meq | EXTENDED_RELEASE_TABLET | Freq: Every day | ORAL | Status: DC

## 2012-09-15 MED ORDER — METOPROLOL SUCCINATE ER 50 MG PO TB24: ORAL_TABLET | ORAL | Status: DC

## 2012-09-15 MED ORDER — AMLODIPINE BESYLATE 10 MG PO TABS: 10.0000 mg | ORAL_TABLET | Freq: Every day | ORAL | Status: DC

## 2012-09-15 MED ORDER — TRIAMTERENE-HCTZ 37.5-25 MG PO TABS: 1.0000 | ORAL_TABLET | Freq: Every day | ORAL | Status: DC

## 2012-10-11 ENCOUNTER — Encounter: Payer: Self-pay | Admitting: Emergency Medicine

## 2012-11-01 ENCOUNTER — Other Ambulatory Visit: Payer: Self-pay | Admitting: Internal Medicine

## 2012-12-07 ENCOUNTER — Telehealth: Payer: Self-pay | Admitting: Internal Medicine

## 2012-12-07 NOTE — Telephone Encounter (Signed)
New problem    Pt has question regarding how to get started to apply for disability--pt aware cory is not here today

## 2012-12-07 NOTE — Telephone Encounter (Signed)
Spoke with pt. He reports he has shortness of breath at times and decreased stamina. He feels he would not be able to do a 40 hour week job. He is asking how to apply for social security and if Dr. Tenny Craw thinks he would be eligible for disability. I told him to contact Social Security office for information on how to apply and that I would send message to Dr. Tenny Craw to see if she felt he may qualify for disability.

## 2012-12-13 NOTE — Telephone Encounter (Signed)
He has significant severe CAD  I do think he is a candidate for disability.

## 2012-12-14 NOTE — Telephone Encounter (Signed)
Pt.notified

## 2013-02-13 ENCOUNTER — Other Ambulatory Visit: Payer: Self-pay | Admitting: Internal Medicine

## 2013-02-14 ENCOUNTER — Other Ambulatory Visit: Payer: Self-pay

## 2013-02-14 MED ORDER — AMLODIPINE BESYLATE 10 MG PO TABS: 10.0000 mg | ORAL_TABLET | Freq: Every day | ORAL | Status: DC

## 2013-02-15 ENCOUNTER — Encounter: Payer: Self-pay | Admitting: Internal Medicine

## 2013-03-07 ENCOUNTER — Ambulatory Visit (INDEPENDENT_AMBULATORY_CARE_PROVIDER_SITE_OTHER): Payer: BC Managed Care – PPO | Admitting: Internal Medicine

## 2013-03-07 ENCOUNTER — Encounter: Payer: Self-pay | Admitting: Internal Medicine

## 2013-03-07 VITALS — BP 134/88 | HR 66 | Ht 72.0 in | Wt 221.0 lb

## 2013-03-07 DIAGNOSIS — E7800 Pure hypercholesterolemia, unspecified: Secondary | ICD-10-CM

## 2013-03-07 DIAGNOSIS — Z79899 Other long term (current) drug therapy: Secondary | ICD-10-CM

## 2013-03-07 DIAGNOSIS — I2510 Atherosclerotic heart disease of native coronary artery without angina pectoris: Secondary | ICD-10-CM

## 2013-03-07 LAB — LIPID PANEL
HDL: 37.8 mg/dL — ABNORMAL LOW (ref >39.00)
Total CHOL/HDL Ratio: 4
Triglycerides: 208 mg/dL — ABNORMAL HIGH (ref 0.0–149.0)

## 2013-03-07 NOTE — Patient Instructions (Signed)
Will obtain labs today and call you with the results (lp/ast)  Your physician recommends that you continue on your current medications as directed. Please refer to the Current Medication list given to you today.  Your physician wants you to follow-up in: June 2015 You will receive a reminder letter in the mail two months in advance. If you don't receive a letter, please call our office to schedule the follow-up appointment.

## 2013-03-07 NOTE — Progress Notes (Signed)
Marland Kitchen HPI Patient is a 67 yo with a history of CAD (s/p CABG in 57; IWMI in 1998.  Redo CABG in 2004 (SVG to OM/LCx; LRA to PDA).  Cath in July 2013 due to increased CP showed severe LM disease into Lcx.; SVG to OM 100%; LIMA to Diag and LAD patent; Radial Artery to PDA patent   LVEF 45%  Plan was to continue medical Rx. He was lst in clinic in December 2013 This summer BP got in 110s  Cut amlodipine to 5   Takes slower starts with walking does ok    Allergies  Allergen Reactions  . Codeine   . Other     Intolerance to strong pain medications  . Ticlopidine Hcl     Current Outpatient Prescriptions  Medication Sig Dispense Refill  . allopurinol (ZYLOPRIM) 300 MG tablet Take 300 mg by mouth daily.        Marland Kitchen amLODipine (NORVASC) 10 MG tablet Take 1 tablet (10 mg total) by mouth daily.  90 tablet  1  . amLODipine (NORVASC) 10 MG tablet       . aspirin 81 MG tablet Take 81 mg by mouth daily.        . clopidogrel (PLAVIX) 75 MG tablet Take 75 mg by mouth daily.        . isosorbide mononitrate (IMDUR) 60 MG 24 hr tablet Take 1 tablet (60 mg total) by mouth daily. Take one tablet daily (60mg )  30 tablet  11  . metoprolol succinate (TOPROL-XL) 50 MG 24 hr tablet Take 1/2 tab twice a day  30 tablet  9  . Multiple Vitamin (MULTIVITAMIN) capsule Take 1 capsule by mouth daily.        . Omega-3 Fatty Acids (FISH OIL) 1200 MG CAPS Take 1 capsule by mouth daily.        Marland Kitchen omeprazole (PRILOSEC) 20 MG capsule Take 20 mg by mouth 2 (two) times daily.        . potassium chloride SA (KLOR-CON M20) 20 MEQ tablet Take 1 tablet (20 mEq total) by mouth daily.  30 tablet  6  . rosuvastatin (CRESTOR) 20 MG tablet Take 10 mg by mouth daily.       Marland Kitchen triamcinolone cream (KENALOG) 0.1 % as needed.      . triamterene-hydrochlorothiazide (MAXZIDE-25) 37.5-25 MG per tablet Take 1 each (1 tablet total) by mouth daily.  30 tablet  6  . valsartan (DIOVAN) 80 MG tablet Take 40 mg by mouth 2 (two) times daily.        No  current facility-administered medications for this visit.    Past Medical History  Diagnosis Date  . CAD (coronary artery disease)      a. CABG 1991;  b.  IWMI  1998;  c.  S/P PTCA  stents to SVG ot OM last in 2003;  d.  Redo CABG x 2 in 2004 (VG->OM->LCX, LRA->PDA);  e. 12/2011 Cath: 3vd, sev LM into LCX dzs, VG->OM 100, LIMA->DIAG->LAD patent, Rad Art->PDA patent, EF 45%, Med Rx.  . HTN (hypertension)   . Dyslipidemia   . BPH (benign prostatic hypertrophy)   . Gout   . Rheumatic fever     as a child    Past Surgical History  Procedure Laterality Date  . Coronary artery bypass graft      1991 (LIMA to LAD/Diag;  SVG to RCA;  SVG to OM)  . Coronary artery bypass graft      2004 (L radial  to PDA; SVG to OM1/ distal LCx)  . Tonsillectomy    . Appendectomy    . Hernia repair      No family history on file.  History   Social History  . Marital Status: Legally Separated    Spouse Name: N/A    Number of Children: N/A  . Years of Education: N/A   Occupational History  . Not on file.   Social History Main Topics  . Smoking status: Former Games developer  . Smokeless tobacco: Never Used  . Alcohol Use: Not on file  . Drug Use: No  . Sexual Activity: Not on file   Other Topics Concern  . Not on file   Social History Narrative  . No narrative on file    Review of Systems:  All systems reviewed.  They are negative to the above problem except as previously stated.  Vital Signs: BP 134/88  Pulse 66  Ht 6' (1.829 m)  Wt 221 lb (100.245 kg)  BMI 29.97 kg/m2  Physical Exam Patient is in NAD HEENT:  Normocephalic, atraumatic. EOMI, PERRLA.  Neck: JVP is normal.  No bruits.  Lungs: clear to auscultation. No rales no wheezes.  Heart: Regular rate and rhythm. Normal S1, S2. No S3.   No significant murmurs. PMI not displaced.  Abdomen:  Supple, nontender. Normal bowel sounds. No masses. No hepatomegaly.  Extremities:   Good distal pulses throughout. No lower extremity edema.   Musculoskeletal :moving all extremities.  Neuro:   alert and oriented x3.  CN II-XII grossly intact.  EKG  SR 66.  IWMI   Assessment and Plan:  1.  CAD.  Doing fairly well.  Takes activities slowly  If he pushes he gets chest pain.  2.  BP  Fair control  He checks at home  I told him if goes higher then should go back on amlodipine 10  3.  HL   Keep on statin.  Check lipids today  I encouraged him to stay as active as he can.  F/U nex spring.

## 2013-03-08 LAB — LDL CHOLESTEROL, DIRECT: Direct LDL: 102.6 mg/dL

## 2013-03-10 ENCOUNTER — Telehealth: Payer: Self-pay | Admitting: *Deleted

## 2013-03-10 DIAGNOSIS — E785 Hyperlipidemia, unspecified: Secondary | ICD-10-CM

## 2013-03-10 MED ORDER — ROSUVASTATIN CALCIUM 20 MG PO TABS: 20.0000 mg | ORAL_TABLET | Freq: Every day | ORAL | Status: DC

## 2013-03-10 NOTE — Telephone Encounter (Signed)
Pt returns call & is aware of recommendation to increase Crestor to 20mg  qhs. Will return for fasting labs on November 18,2014. appt made & reminder sent to pt Prescription for crestor sent to pt as he has this filled at the [facility] RN

## 2013-03-10 NOTE — Telephone Encounter (Signed)
Message copied by Barrie Folk on Thu Mar 10, 2013  4:07 PM ------      Message from: Dietrich Pates V      Created: Thu Mar 10, 2013  1:50 PM       I would increase Crestor to 20 mg per day.      F/U lipids in 8 wks with liver panel ------

## 2013-03-14 ENCOUNTER — Telehealth: Payer: Self-pay

## 2013-03-14 NOTE — Telephone Encounter (Signed)
Called wanted samples of bystolic 10 or 5 mg I gave her samples of 5 mg 3 weeks worth

## 2013-03-14 NOTE — Telephone Encounter (Signed)
Clarification on meds

## 2013-03-15 ENCOUNTER — Telehealth: Payer: Self-pay | Admitting: Internal Medicine

## 2013-03-15 NOTE — Telephone Encounter (Signed)
I would recomm that he take Crestor 20 to get better control of LDL CHeck lipids and AST in 8 to 10 wks.

## 2013-03-15 NOTE — Telephone Encounter (Signed)
Returned call to patient he stated he was calling back about clarification on if he should take crestor 20 mg or atorvastatin 80 mg.Stated he is taking atorvastatin 80 mg daily.Message sent to Dr.Ross for advice.

## 2013-03-15 NOTE — Telephone Encounter (Signed)
Follow Up:  Pt states he is calling in regards to his cholesterol meds. Pt states he spoke yesterday to the nurse about his meds.

## 2013-03-16 NOTE — Telephone Encounter (Signed)
Spoke with pt, aware of dr Tenny Craw recommendations. He has a prescription. He will call with problems.

## 2013-03-16 NOTE — Telephone Encounter (Signed)
Left message for pt to call.

## 2013-03-17 ENCOUNTER — Telehealth: Payer: Self-pay | Admitting: Internal Medicine

## 2013-03-17 DIAGNOSIS — E785 Hyperlipidemia, unspecified: Secondary | ICD-10-CM

## 2013-03-17 MED ORDER — ROSUVASTATIN CALCIUM 20 MG PO TABS: 20.0000 mg | ORAL_TABLET | Freq: Every day | ORAL | Status: DC

## 2013-03-17 NOTE — Telephone Encounter (Signed)
Spoke with pt, he also states the [place] will need an explanation for the change. Will send a copy of the labs with the script. Pt agreed with this plan.

## 2013-03-17 NOTE — Telephone Encounter (Signed)
Left message for pt to call.

## 2013-03-17 NOTE — Telephone Encounter (Signed)
New Problem:  Pt states he needs his New Rx faxed to the [place]. Pt states it is for a new cholesterol med. Pt states this is the fax number [phone]... I informed the pt I didn't think that was a complete fax # as it's only 8 digits but he states rthats what the [place] gave him.

## 2013-03-17 NOTE — Telephone Encounter (Signed)
Follow up:  Pt is calling back with the correct fax # to [place] [phone]... Pt is returning Debra's call.

## 2013-03-25 ENCOUNTER — Telehealth: Payer: Self-pay | Admitting: Internal Medicine

## 2013-03-25 DIAGNOSIS — E785 Hyperlipidemia, unspecified: Secondary | ICD-10-CM

## 2013-03-25 MED ORDER — ROSUVASTATIN CALCIUM 20 MG PO TABS: 20.0000 mg | ORAL_TABLET | Freq: Every day | ORAL | Status: DC

## 2013-03-25 NOTE — Telephone Encounter (Signed)
Follow Up:  Pt states he is trying to get [facility] to refax a new Rx to the [place].... Rosuvastatin CA 20 mg... [phone] [facility] Dr. Barth Kirks

## 2013-03-25 NOTE — Telephone Encounter (Deleted)
ERROR

## 2013-03-25 NOTE — Telephone Encounter (Signed)
Spoke with pt, will resend fax

## 2013-04-10 ENCOUNTER — Other Ambulatory Visit: Payer: Self-pay | Admitting: Internal Medicine

## 2013-04-11 ENCOUNTER — Other Ambulatory Visit: Payer: Self-pay | Admitting: *Deleted

## 2013-04-11 ENCOUNTER — Telehealth: Payer: Self-pay | Admitting: Internal Medicine

## 2013-04-11 DIAGNOSIS — E785 Hyperlipidemia, unspecified: Secondary | ICD-10-CM

## 2013-04-11 MED ORDER — ROSUVASTATIN CALCIUM 20 MG PO TABS: 20.0000 mg | ORAL_TABLET | Freq: Every day | ORAL | Status: DC

## 2013-04-11 NOTE — Telephone Encounter (Signed)
Left message for pt to call.

## 2013-04-11 NOTE — Telephone Encounter (Signed)
Spoke with pt, i am going to send another script to joi at the [place] with dr Laverda Sorenson at [phone].

## 2013-04-11 NOTE — Telephone Encounter (Signed)
New problem   Pt need you to resend the prescription to [facility] hospital and but need you to call him first.

## 2013-04-29 ENCOUNTER — Telehealth: Payer: Self-pay | Admitting: Internal Medicine

## 2013-04-29 NOTE — Telephone Encounter (Signed)
New message     [facility]Marland KitchenMarland KitchenHave lab appt scheduled for nov---Should he resc lab further out to give the cholesterol rx time to work?

## 2013-04-29 NOTE — Telephone Encounter (Signed)
Spoke with pt, lab appt rescheduled for 8 weeks from today for fasting labs

## 2013-05-10 ENCOUNTER — Other Ambulatory Visit: Payer: BC Managed Care – PPO

## 2013-06-13 ENCOUNTER — Other Ambulatory Visit: Payer: Self-pay

## 2013-06-13 DIAGNOSIS — R079 Chest pain, unspecified: Secondary | ICD-10-CM

## 2013-06-13 MED ORDER — POTASSIUM CHLORIDE CRYS ER 20 MEQ PO TBCR: 20.0000 meq | EXTENDED_RELEASE_TABLET | Freq: Every day | ORAL | Status: DC

## 2013-06-13 MED ORDER — TRIAMTERENE-HCTZ 37.5-25 MG PO TABS: ORAL_TABLET | ORAL | Status: DC

## 2013-06-13 MED ORDER — AMLODIPINE BESYLATE 10 MG PO TABS: 10.0000 mg | ORAL_TABLET | Freq: Every day | ORAL | Status: DC

## 2013-06-13 MED ORDER — CLOPIDOGREL BISULFATE 75 MG PO TABS: 75.0000 mg | ORAL_TABLET | Freq: Every day | ORAL | Status: DC

## 2013-06-13 MED ORDER — ISOSORBIDE MONONITRATE ER 60 MG PO TB24: 60.0000 mg | ORAL_TABLET | Freq: Every day | ORAL | Status: DC

## 2013-06-13 MED ORDER — METOPROLOL SUCCINATE ER 50 MG PO TB24: ORAL_TABLET | ORAL | Status: DC

## 2013-06-14 ENCOUNTER — Telehealth: Payer: Self-pay | Admitting: Internal Medicine

## 2013-06-14 NOTE — Telephone Encounter (Signed)
Scripts were sent 06/13/2013

## 2013-06-14 NOTE — Telephone Encounter (Signed)
New message    Need potassium chloride pres [facility].  He only has 3 pills left until his mailorder presc gets here.    Send 90day supply to [facility] mail order.  We should have gotten a fax to refill 4 or 5 other medications to send to [facility].

## 2013-06-22 ENCOUNTER — Other Ambulatory Visit: Payer: Self-pay

## 2013-06-22 DIAGNOSIS — R079 Chest pain, unspecified: Secondary | ICD-10-CM

## 2013-06-22 MED ORDER — AMLODIPINE BESYLATE 10 MG PO TABS: 10.0000 mg | ORAL_TABLET | Freq: Every day | ORAL | Status: DC

## 2013-06-22 MED ORDER — POTASSIUM CHLORIDE CRYS ER 20 MEQ PO TBCR: 20.0000 meq | EXTENDED_RELEASE_TABLET | Freq: Every day | ORAL | Status: DC

## 2013-06-22 MED ORDER — ISOSORBIDE MONONITRATE ER 60 MG PO TB24: ORAL_TABLET | ORAL | Status: DC

## 2013-06-22 MED ORDER — CLOPIDOGREL BISULFATE 75 MG PO TABS: 75.0000 mg | ORAL_TABLET | Freq: Every day | ORAL | Status: DC

## 2013-06-22 MED ORDER — TRIAMTERENE-HCTZ 37.5-25 MG PO TABS: ORAL_TABLET | ORAL | Status: DC

## 2013-06-22 MED ORDER — METOPROLOL SUCCINATE ER 50 MG PO TB24: ORAL_TABLET | ORAL | Status: DC

## 2013-06-22 MED ORDER — ISOSORBIDE MONONITRATE ER 60 MG PO TB24: 60.0000 mg | ORAL_TABLET | Freq: Every day | ORAL | Status: DC

## 2013-06-27 ENCOUNTER — Other Ambulatory Visit (INDEPENDENT_AMBULATORY_CARE_PROVIDER_SITE_OTHER): Payer: Medicare HMO

## 2013-06-27 ENCOUNTER — Telehealth: Payer: Self-pay

## 2013-06-27 DIAGNOSIS — E785 Hyperlipidemia, unspecified: Secondary | ICD-10-CM

## 2013-06-27 LAB — LIPID PANEL
CHOL/HDL RATIO: 5
CHOLESTEROL: 207 mg/dL — AB (ref 0–200)
HDL: 41 mg/dL (ref >39.00)
TRIGLYCERIDES: 336 mg/dL — AB (ref 0.0–149.0)
VLDL: 67.2 mg/dL — AB (ref 0.0–40.0)

## 2013-06-27 LAB — HEPATIC FUNCTION PANEL
ALBUMIN: 4.1 g/dL (ref 3.5–5.2)
ALT: 27 U/L (ref 0–53)
AST: 26 U/L (ref 0–37)
Alkaline Phosphatase: 54 U/L (ref 39–117)
BILIRUBIN TOTAL: 0.8 mg/dL (ref 0.3–1.2)
Bilirubin, Direct: 0.1 mg/dL (ref 0.0–0.3)
Total Protein: 7 g/dL (ref 6.0–8.3)

## 2013-06-27 LAB — LDL CHOLESTEROL, DIRECT: Direct LDL: 119.2 mg/dL

## 2013-06-27 NOTE — Telephone Encounter (Signed)
Patient came to office about get a refill for plavix and imdur both rx was at the pharmacy waiting to be picked up.He also had and issues with rosvastatin making his bowels leak. I talked to Dr Harrington Challenger she told me to tell him to stop this med until his bowels are normal and then we would start him on something else .

## 2013-06-28 ENCOUNTER — Telehealth: Payer: Self-pay | Admitting: *Deleted

## 2013-06-28 NOTE — Telephone Encounter (Signed)
Spoke with pt regarding his cholesterol results. He reports he had stopped the crestor 2 days prior to the blood work because he is having diarrhea and a burning pain on the rectum. He thinks it may have been the crestor. He has spoken to his PCP and they have set him up for a colonoscopy on 07-13-13. He wants to stay off the crestor until after the colonoscopy is complete. Okay given for that. He will call once the procedure is done so we can discuss medication for the cholesterol. Pt agreed with this plan.

## 2013-07-08 ENCOUNTER — Other Ambulatory Visit: Payer: Self-pay | Admitting: *Deleted

## 2013-07-08 MED ORDER — METOPROLOL SUCCINATE ER 50 MG PO TB24: ORAL_TABLET | ORAL | Status: DC

## 2013-07-08 MED ORDER — POTASSIUM CHLORIDE CRYS ER 20 MEQ PO TBCR: 20.0000 meq | EXTENDED_RELEASE_TABLET | Freq: Every day | ORAL | Status: DC

## 2013-07-08 MED ORDER — AMLODIPINE BESYLATE 10 MG PO TABS: 10.0000 mg | ORAL_TABLET | Freq: Every day | ORAL | Status: DC

## 2013-07-21 ENCOUNTER — Telehealth: Payer: Self-pay | Admitting: Internal Medicine

## 2013-07-21 NOTE — Telephone Encounter (Signed)
Called Otila Kluver at [facility]. Patient needs an endo for reflux and a colon for screening and they would like him to hold Plavix and ASA 5 days prior to procedure. Discussed with Dr.Ross and she approved above. Called Otila Kluver back and she is aware of agreement with the plan.

## 2013-07-21 NOTE — Telephone Encounter (Signed)
New problem   A fax was sent over for pt to stop his Aspirin and Plavix and they need to know today if not pt's procedure will have to be canceled. Please advise.

## 2013-07-24 HISTORY — PX: COLONOSCOPY: SHX174

## 2013-08-04 ENCOUNTER — Telehealth: Payer: Self-pay | Admitting: Internal Medicine

## 2013-08-04 DIAGNOSIS — E785 Hyperlipidemia, unspecified: Secondary | ICD-10-CM

## 2013-08-04 MED ORDER — ATORVASTATIN CALCIUM 40 MG PO TABS: 40.0000 mg | ORAL_TABLET | Freq: Every day | ORAL | Status: DC

## 2013-08-04 NOTE — Telephone Encounter (Signed)
WOuld recomm Lipitor 40 mg   F/u with lipid panel and AST in 6 to 8 wks

## 2013-08-04 NOTE — Telephone Encounter (Signed)
New message  Patient would like to discuss changing his medication. Please call and advise.

## 2013-08-04 NOTE — Telephone Encounter (Signed)
Spoke with pt, Aware of dr ross recommendation  

## 2013-08-04 NOTE — Telephone Encounter (Signed)
Patient would like to try different cholesterol medication.  He was on crestor, having bowel leakage.  Stopped crestor for colonoscopy and bowel leakage stopped.  Has been back on crestor for 2 days and feels it is returning.  Please rec a different medicaton.  His insurance covers atorvastatin and simvastatin as well as pravastatin.

## 2013-08-08 ENCOUNTER — Telehealth: Payer: Self-pay | Admitting: Internal Medicine

## 2013-08-08 MED ORDER — VALSARTAN-HYDROCHLOROTHIAZIDE 160-25 MG PO TABS: 1.0000 | ORAL_TABLET | Freq: Every day | ORAL | Status: DC

## 2013-08-08 NOTE — Telephone Encounter (Signed)
New message    Has some questions regarding medication cost. $ 275.00 for patient to pay for himself.  Valsartan 80 mg

## 2013-08-08 NOTE — Telephone Encounter (Signed)
Spoke with pt, Aware of dr Harrington Challenger recommendation. He will track his bp at home and let us know if it is trending high.

## 2013-08-08 NOTE — Telephone Encounter (Signed)
Yes- OK to switch

## 2013-08-08 NOTE — Telephone Encounter (Signed)
Spoke with pt, he is trying to get away from getting his meds from the [place]. The only one left to change is the valsartan. He is currently taking valsartan 80 mg bid. He would like to change to the valsartan/HCTZ 160/25 mg once daily. He is currently taking triamterene/HCTZ  37.5/25 mg and wants to change to the valsartan with the HCTZ because it will cost him nothing. Will forward for dr Harrington Challenger review.

## 2013-09-22 ENCOUNTER — Other Ambulatory Visit: Payer: Self-pay | Admitting: *Deleted

## 2013-09-22 ENCOUNTER — Telehealth: Payer: Self-pay | Admitting: *Deleted

## 2013-09-22 MED ORDER — NITROGLYCERIN 0.4 MG SL SUBL: 0.4000 mg | SUBLINGUAL_TABLET | SUBLINGUAL | Status: DC | PRN

## 2013-09-22 MED ORDER — CLOPIDOGREL BISULFATE 75 MG PO TABS: 75.0000 mg | ORAL_TABLET | Freq: Every day | ORAL | Status: DC

## 2013-09-22 MED ORDER — VALSARTAN-HYDROCHLOROTHIAZIDE 160-25 MG PO TABS: 1.0000 | ORAL_TABLET | Freq: Every day | ORAL | Status: DC

## 2013-09-22 NOTE — Telephone Encounter (Signed)
Patient requests nitro refill, but it is not on his med list or even his history list. He stated that it was originally prescribed by his previous cardiologist. Madaline Brilliant to refill? Please advise. Thanks, MI

## 2013-09-22 NOTE — Telephone Encounter (Signed)
This is a Dr Harrington Challenger pt. Will forward to Fredia Beets, Dr Alan Ripper nurse.

## 2013-09-22 NOTE — Telephone Encounter (Signed)
NTG refill sent to the pharm.

## 2013-09-23 ENCOUNTER — Other Ambulatory Visit: Payer: Self-pay

## 2013-09-23 MED ORDER — AMLODIPINE BESYLATE 5 MG PO TABS
5.0000 mg | ORAL_TABLET | Freq: Every day | ORAL | Status: DC
Start: 2013-09-23 — End: 2014-09-06

## 2013-10-03 ENCOUNTER — Other Ambulatory Visit (INDEPENDENT_AMBULATORY_CARE_PROVIDER_SITE_OTHER): Payer: Medicare HMO

## 2013-10-03 DIAGNOSIS — E785 Hyperlipidemia, unspecified: Secondary | ICD-10-CM

## 2013-10-03 LAB — LIPID PANEL
Cholesterol: 174 mg/dL (ref 0–200)
HDL: 36.9 mg/dL — ABNORMAL LOW (ref >39.00)
LDL Cholesterol: 80 mg/dL (ref 0–99)
Total CHOL/HDL Ratio: 5
Triglycerides: 284 mg/dL — ABNORMAL HIGH (ref 0.0–149.0)
VLDL: 56.8 mg/dL — ABNORMAL HIGH (ref 0.0–40.0)

## 2013-10-03 LAB — AST: AST: 25 U/L (ref 0–37)

## 2013-10-04 ENCOUNTER — Encounter: Payer: Self-pay | Admitting: *Deleted

## 2013-10-07 ENCOUNTER — Telehealth: Payer: Self-pay | Admitting: Internal Medicine

## 2013-10-07 MED ORDER — ATORVASTATIN CALCIUM 40 MG PO TABS: 40.0000 mg | ORAL_TABLET | Freq: Every day | ORAL | Status: DC

## 2013-10-07 NOTE — Telephone Encounter (Signed)
New message ° ° ° °Pt want test results. °

## 2013-10-07 NOTE — Telephone Encounter (Signed)
Pt is aware of cholesterol results. New prescription sent to [facility]

## 2013-11-28 ENCOUNTER — Ambulatory Visit (INDEPENDENT_AMBULATORY_CARE_PROVIDER_SITE_OTHER): Payer: Medicare HMO | Admitting: Internal Medicine

## 2013-11-28 ENCOUNTER — Encounter: Payer: Self-pay | Admitting: Internal Medicine

## 2013-11-28 VITALS — BP 132/78 | HR 64 | Ht 72.0 in | Wt 228.0 lb

## 2013-11-28 DIAGNOSIS — I208 Other forms of angina pectoris: Secondary | ICD-10-CM

## 2013-11-28 DIAGNOSIS — I2510 Atherosclerotic heart disease of native coronary artery without angina pectoris: Secondary | ICD-10-CM

## 2013-11-28 DIAGNOSIS — E785 Hyperlipidemia, unspecified: Secondary | ICD-10-CM

## 2013-11-28 DIAGNOSIS — I209 Angina pectoris, unspecified: Secondary | ICD-10-CM

## 2013-11-28 DIAGNOSIS — I10 Essential (primary) hypertension: Secondary | ICD-10-CM

## 2013-11-28 LAB — BASIC METABOLIC PANEL
BUN: 18 mg/dL (ref 6–23)
CALCIUM: 9.7 mg/dL (ref 8.4–10.5)
CO2: 29 mEq/L (ref 19–32)
CREATININE: 1 mg/dL (ref 0.4–1.5)
Chloride: 102 mEq/L (ref 96–112)
GFR: 82.43 mL/min (ref >60.00)
GLUCOSE: 112 mg/dL — AB (ref 70–99)
Potassium: 3.6 mEq/L (ref 3.5–5.1)
Sodium: 139 mEq/L (ref 135–145)

## 2013-11-28 LAB — CBC
HEMATOCRIT: 45.3 % (ref 39.0–52.0)
Hemoglobin: 15 g/dL (ref 13.0–17.0)
MCHC: 33.1 g/dL (ref 30.0–36.0)
MCV: 93.2 fl (ref 78.0–100.0)
PLATELETS: 215 10*3/uL (ref 150.0–400.0)
RBC: 4.86 Mil/uL (ref 4.22–5.81)
RDW: 13.6 % (ref 11.5–15.5)
WBC: 12.2 10*3/uL — AB (ref 4.0–10.5)

## 2013-11-28 NOTE — Progress Notes (Signed)
Marland Kitchen HPI Patient is a 68 yo with a history of CAD (s/p CABG in 80; IWMI in 1998.  Redo CABG in 2004 (SVG to OM/LCx; LRA to PDA).  Cath in July 2013 due to increased CP showed severe LM disease into Lcx.; SVG to OM 100%; LIMA to Diag and LAD patent; Radial Artery to PDA patent   LVEF 45%  Plan was to continue medical Rx. Since he was last seen he says he has been feeling good.  No CP  Breathing is OK     Allergies  Allergen Reactions  . Codeine   . Other     Intolerance to strong pain medications  . Ticlopidine Hcl     Current Outpatient Prescriptions  Medication Sig Dispense Refill  . allopurinol (ZYLOPRIM) 300 MG tablet Take 300 mg by mouth daily.        Marland Kitchen amLODipine (NORVASC) 5 MG tablet Take 1 tablet (5 mg total) by mouth daily.  180 tablet  1  . aspirin 81 MG tablet Take 81 mg by mouth daily.        Marland Kitchen atorvastatin (LIPITOR) 40 MG tablet Take 1 tablet (40 mg total) by mouth daily.  90 tablet  3  . clopidogrel (PLAVIX) 75 MG tablet Take 1 tablet (75 mg total) by mouth daily.  90 tablet  0  . isosorbide mononitrate (IMDUR) 60 MG 24 hr tablet Take one tablet daily (60mg )  90 tablet  1  . metoprolol succinate (TOPROL-XL) 50 MG 24 hr tablet Take 1/2 tab twice a day  90 tablet  1  . Multiple Vitamin (MULTIVITAMIN) capsule Take 1 capsule by mouth daily.        . nitroGLYCERIN (NITROSTAT) 0.4 MG SL tablet Place 1 tablet (0.4 mg total) under the tongue every 5 (five) minutes as needed for chest pain.  25 tablet  12  . Omega-3 Fatty Acids (FISH OIL) 1200 MG CAPS Take 1 capsule by mouth daily.        Marland Kitchen omeprazole (PRILOSEC) 20 MG capsule Take 20 mg by mouth 2 (two) times daily.        . potassium chloride SA (KLOR-CON M20) 20 MEQ tablet Take 1 tablet (20 mEq total) by mouth daily.  90 tablet  1  . triamcinolone cream (KENALOG) 0.1 % as needed.      . valsartan-hydrochlorothiazide (DIOVAN-HCT) 160-25 MG per tablet Take 1 tablet by mouth daily.  90 tablet  0   No current facility-administered  medications for this visit.    Past Medical History  Diagnosis Date  . CAD (coronary artery disease)      a. CABG 1991;  b.  [place];  c.  S/P PTCA  stents to SVG ot OM last in 2003;  d.  Redo CABG x 2 in 2004 (VG->OM->LCX, LRA->PDA);  e. 12/2011 Cath: 3vd, sev LM into LCX dzs, VG->OM 100, LIMA->DIAG->LAD patent, Rad Art->PDA patent, EF 45%, Med Rx.  . HTN (hypertension)   . Dyslipidemia   . BPH (benign prostatic hypertrophy)   . Gout   . Rheumatic fever     as a child    Past Surgical History  Procedure Laterality Date  . Coronary artery bypass graft      1991 (LIMA to LAD/Diag;  SVG to RCA;  SVG to OM)  . Coronary artery bypass graft      2004 (L radial to PDA; SVG to OM1/ distal LCx)  . Tonsillectomy    . Appendectomy    .  Hernia repair      No family history on file.  History   Social History  . Marital Status: Legally Separated    Spouse Name: N/A    Number of Children: N/A  . Years of Education: N/A   Occupational History  . Not on file.   Social History Main Topics  . Smoking status: Former Research scientist (life sciences)  . Smokeless tobacco: Never Used  . Alcohol Use: Not on file  . Drug Use: No  . Sexual Activity: Not on file   Other Topics Concern  . Not on file   Social History Narrative  . No narrative on file    Review of Systems:  All systems reviewed.  They are negative to the above problem except as previously stated.  Vital Signs: BP 132/78  Pulse 64  Ht 6' (1.829 m)  Wt 228 lb (103.42 kg)  BMI 30.92 kg/m2  Physical Exam Patient is in NAD HEENT:  Normocephalic, atraumatic. EOMI, PERRLA.  Neck: JVP is normal.  No bruits.  Lungs: clear to auscultation. No rales no wheezes.  Heart: Regular rate and rhythm. Normal S1, S2. No S3.   No significant murmurs. PMI not displaced.  Abdomen:  Supple, nontender. Normal bowel sounds. No masses. No hepatomegaly.  Extremities:   Good distal pulses throughout. No lower extremity edema.  Musculoskeletal :moving all  extremities.  Neuro:   alert and oriented x3.  CN II-XII grossly intact.  EKG  SR 64.  IWMI  LVH   Assessment and Plan:  1.  CAD.  Doing wll  No CP    2.  BP  Adequate control

## 2013-11-28 NOTE — Patient Instructions (Signed)
Your physician recommends that you continue on your current medications as directed. Please refer to the Current Medication list given to you today. Your physician recommends that you return for lab work in: TODAY (CBC, BMET)  Your physician wants you to follow-up in: [place].  You will receive a reminder letter in the mail two months in advance. If you don't receive a letter, please call our office to schedule the follow-up appointment.

## 2013-11-30 ENCOUNTER — Other Ambulatory Visit: Payer: Self-pay | Admitting: Internal Medicine

## 2013-12-01 ENCOUNTER — Other Ambulatory Visit: Payer: Self-pay | Admitting: *Deleted

## 2013-12-01 DIAGNOSIS — D72829 Elevated white blood cell count, unspecified: Secondary | ICD-10-CM

## 2013-12-14 ENCOUNTER — Other Ambulatory Visit (INDEPENDENT_AMBULATORY_CARE_PROVIDER_SITE_OTHER): Payer: Medicare HMO

## 2013-12-14 DIAGNOSIS — D72829 Elevated white blood cell count, unspecified: Secondary | ICD-10-CM

## 2013-12-14 LAB — CBC WITH DIFFERENTIAL/PLATELET
Basophils Absolute: 0 10*3/uL (ref 0.0–0.1)
Basophils Relative: 0.4 % (ref 0.0–3.0)
EOS ABS: 0.1 10*3/uL (ref 0.0–0.7)
Eosinophils Relative: 1.3 % (ref 0.0–5.0)
HCT: 43.6 % (ref 39.0–52.0)
Hemoglobin: 14.7 g/dL (ref 13.0–17.0)
Lymphocytes Relative: 55.4 % — ABNORMAL HIGH (ref 12.0–46.0)
Lymphs Abs: 5.9 10*3/uL — ABNORMAL HIGH (ref 0.7–4.0)
MCHC: 33.7 g/dL (ref 30.0–36.0)
MCV: 93.4 fl (ref 78.0–100.0)
MONO ABS: 0.7 10*3/uL (ref 0.1–1.0)
Monocytes Relative: 6.4 % (ref 3.0–12.0)
NEUTROS PCT: 36.5 % — AB (ref 43.0–77.0)
Neutro Abs: 3.9 10*3/uL (ref 1.4–7.7)
Platelets: 220 10*3/uL (ref 150.0–400.0)
RBC: 4.67 Mil/uL (ref 4.22–5.81)
RDW: 13.5 % (ref 11.5–15.5)
WBC: 10.7 10*3/uL — AB (ref 4.0–10.5)

## 2013-12-20 ENCOUNTER — Encounter: Payer: Self-pay | Admitting: Internal Medicine

## 2013-12-20 NOTE — Telephone Encounter (Signed)
New message  Pt called for lab results please call

## 2013-12-20 NOTE — Telephone Encounter (Signed)
This encounter was created in error - please disregard.

## 2014-03-29 ENCOUNTER — Telehealth: Payer: Self-pay | Admitting: *Deleted

## 2014-03-29 DIAGNOSIS — R7989 Other specified abnormal findings of blood chemistry: Secondary | ICD-10-CM

## 2014-03-29 NOTE — Telephone Encounter (Signed)
Advised patient and he will come tomorrow for CBC  Patient had CBC a couple months ago, White count mildly increased Wanted to recheck WOuld like him to get a CBC with a manual differential at his convenience ----- Message ----- From: Fay Records, MD Sent: 01/08/2014 11:16 PM To: Fay Records, MD

## 2014-03-30 ENCOUNTER — Other Ambulatory Visit (INDEPENDENT_AMBULATORY_CARE_PROVIDER_SITE_OTHER): Payer: Medicare HMO | Admitting: *Deleted

## 2014-03-30 DIAGNOSIS — R7989 Other specified abnormal findings of blood chemistry: Secondary | ICD-10-CM

## 2014-03-30 LAB — CBC WITH DIFFERENTIAL/PLATELET
BASOS PCT: 0.2 % (ref 0.0–3.0)
Basophils Absolute: 0 10*3/uL (ref 0.0–0.1)
EOS PCT: 2.1 % (ref 0.0–5.0)
Eosinophils Absolute: 0.2 10*3/uL (ref 0.0–0.7)
HCT: 44.9 % (ref 39.0–52.0)
Hemoglobin: 15.1 g/dL (ref 13.0–17.0)
LYMPHS PCT: 53.4 % — AB (ref 12.0–46.0)
Lymphs Abs: 5.6 10*3/uL — ABNORMAL HIGH (ref 0.7–4.0)
MCHC: 33.7 g/dL (ref 30.0–36.0)
MCV: 93 fl (ref 78.0–100.0)
MONOS PCT: 5.5 % (ref 3.0–12.0)
Monocytes Absolute: 0.6 10*3/uL (ref 0.1–1.0)
NEUTROS PCT: 38.8 % — AB (ref 43.0–77.0)
Neutro Abs: 4.1 10*3/uL (ref 1.4–7.7)
Platelets: 253 10*3/uL (ref 150.0–400.0)
RBC: 4.83 Mil/uL (ref 4.22–5.81)
RDW: 13.6 % (ref 11.5–15.5)
WBC: 10.4 10*3/uL (ref 4.0–10.5)

## 2014-03-31 ENCOUNTER — Other Ambulatory Visit (HOSPITAL_COMMUNITY)
Admission: RE | Admit: 2014-03-31 | Discharge: 2014-03-31 | Disposition: A | Discharge status: Home / Self Care | Payer: Medicare HMO | Source: Ambulatory Visit | Attending: Cardiology | Admitting: Cardiology

## 2014-03-31 ENCOUNTER — Telehealth: Payer: Self-pay | Admitting: *Deleted

## 2014-03-31 ENCOUNTER — Other Ambulatory Visit: Payer: Self-pay | Admitting: *Deleted

## 2014-03-31 DIAGNOSIS — R7989 Other specified abnormal findings of blood chemistry: Secondary | ICD-10-CM

## 2014-03-31 NOTE — Telephone Encounter (Signed)
Call from Dr. Harrington Challenger. Patient needs to have flow cytometry drawn. Spoke with Lanny Hurst in lab.  Our lab does not have the necessary tubes for this test. Called patient.  Advised that he go to lab at [facility] to have the flow cytometry drawn. Pt verbalizes undertanding and agreement.

## 2014-04-03 ENCOUNTER — Other Ambulatory Visit: Payer: Self-pay | Admitting: Oncology

## 2014-04-03 ENCOUNTER — Telehealth: Payer: Self-pay | Admitting: Internal Medicine

## 2014-04-03 NOTE — Telephone Encounter (Signed)
Follow Up ° °Pt called for lab results//sr  °

## 2014-04-04 ENCOUNTER — Telehealth: Payer: Self-pay | Admitting: Oncology

## 2014-04-04 NOTE — Telephone Encounter (Signed)
S/W PATIENT AND GAVE NP FOR 11/19 @ 4 W/DR. [place], [place] 11/05 @ 8. PATIENT CONFIRM ALL APPTS

## 2014-04-04 NOTE — Telephone Encounter (Signed)
Spoke with patient. He wants results from flow cytometry labs he had last week. Will check with Dr. Harrington Challenger.

## 2014-04-09 NOTE — Telephone Encounter (Signed)
Patient called last week.

## 2014-04-26 ENCOUNTER — Other Ambulatory Visit: Payer: Self-pay | Admitting: *Deleted

## 2014-04-26 DIAGNOSIS — D72820 Lymphocytosis (symptomatic): Secondary | ICD-10-CM

## 2014-04-27 ENCOUNTER — Other Ambulatory Visit (HOSPITAL_BASED_OUTPATIENT_CLINIC_OR_DEPARTMENT_OTHER): Payer: Medicare HMO

## 2014-04-27 DIAGNOSIS — D72820 Lymphocytosis (symptomatic): Secondary | ICD-10-CM

## 2014-04-27 LAB — COMPREHENSIVE METABOLIC PANEL (CC13)
ALK PHOS: 71 U/L (ref 40–150)
ALT: 25 U/L (ref 0–55)
AST: 18 U/L (ref 5–34)
Albumin: 3.7 g/dL (ref 3.5–5.0)
Anion Gap: 11 mEq/L (ref 3–11)
BILIRUBIN TOTAL: 0.67 mg/dL (ref 0.20–1.20)
BUN: 18.5 mg/dL (ref 7.0–26.0)
CO2: 26 mEq/L (ref 22–29)
Calcium: 9.8 mg/dL (ref 8.4–10.4)
Chloride: 103 mEq/L (ref 98–109)
Creatinine: 1.2 mg/dL (ref 0.7–1.3)
GLUCOSE: 250 mg/dL — AB (ref 70–140)
Potassium: 3.7 mEq/L (ref 3.5–5.1)
Sodium: 140 mEq/L (ref 136–145)
TOTAL PROTEIN: 6.4 g/dL (ref 6.4–8.3)

## 2014-04-27 LAB — LACTATE DEHYDROGENASE (CC13): LDH: 143 U/L (ref 125–245)

## 2014-04-27 LAB — CBC WITH DIFFERENTIAL/PLATELET
BASO%: 0.1 % (ref 0.0–2.0)
BASOS ABS: 0 10*3/uL (ref 0.0–0.1)
EOS ABS: 0.2 10*3/uL (ref 0.0–0.5)
EOS%: 2 % (ref 0.0–7.0)
HEMATOCRIT: 43.1 % (ref 38.4–49.9)
HEMOGLOBIN: 14.7 g/dL (ref 13.0–17.1)
LYMPH%: 52.3 % — AB (ref 14.0–49.0)
MCH: 31.2 pg (ref 27.2–33.4)
MCHC: 34.1 g/dL (ref 32.0–36.0)
MCV: 91.5 fL (ref 79.3–98.0)
MONO#: 0.5 10*3/uL (ref 0.1–0.9)
MONO%: 6 % (ref 0.0–14.0)
NEUT%: 39.6 % (ref 39.0–75.0)
NEUTROS ABS: 3.5 10*3/uL (ref 1.5–6.5)
PLATELETS: 194 10*3/uL (ref 140–400)
RBC: 4.71 10*6/uL (ref 4.20–5.82)
RDW: 13 % (ref 11.0–14.6)
WBC: 8.9 10*3/uL (ref 4.0–10.3)
lymph#: 4.6 10*3/uL — ABNORMAL HIGH (ref 0.9–3.3)

## 2014-04-27 LAB — CHCC SMEAR

## 2014-05-01 LAB — BETA 2 MICROGLOBULIN, SERUM: BETA 2 MICROGLOBULIN: 2.06 mg/L (ref <=2.51)

## 2014-05-11 ENCOUNTER — Encounter: Payer: Self-pay | Admitting: Oncology

## 2014-05-11 ENCOUNTER — Ambulatory Visit (HOSPITAL_BASED_OUTPATIENT_CLINIC_OR_DEPARTMENT_OTHER): Payer: Medicare HMO | Admitting: Oncology

## 2014-05-11 ENCOUNTER — Encounter (INDEPENDENT_AMBULATORY_CARE_PROVIDER_SITE_OTHER): Payer: Self-pay

## 2014-05-11 ENCOUNTER — Ambulatory Visit: Payer: Medicare HMO

## 2014-05-11 VITALS — BP 151/79 | HR 68 | Temp 97.7°F | Resp 18 | Ht 72.0 in | Wt 228.9 lb

## 2014-05-11 DIAGNOSIS — C9110 Chronic lymphocytic leukemia of B-cell type not having achieved remission: Secondary | ICD-10-CM

## 2014-05-11 NOTE — Progress Notes (Signed)
Checked in new pt with no financial concerns. °

## 2014-05-13 DIAGNOSIS — C9110 Chronic lymphocytic leukemia of B-cell type not having achieved remission: Secondary | ICD-10-CM | POA: Insufficient documentation

## 2014-05-13 NOTE — Progress Notes (Signed)
Brandon Stone  [phone] [phone]     ID: Brandon Stone DOB: 07/16/1945  MR#: [id number]  PFX#:[id number]  Patient Care Team: Brandon Morning, DO as PCP - General (Family Medicine) Brandon Records, MD as Consulting Physician (Cardiology) Brandon Cruel, MD as Consulting Physician (Oncology) OTHER MD: Brandon Stone M.D.  CHIEF COMPLAINT: Lymphocytosis   CURRENT TREATMENT: Observation   HISTORY OF PRESENT ILLNESS: Brandon Stone is followed closely by Brandon Stone for his heart problems. She noted a persistent rise in his white cell count, and on 03/31/2014 sent a sample for flow cytometry. This showed ([id number]) an abnormal B-cell population coexpressing CD5 and CD23. Light chains were 2 dim for assessment. The absolute lymphocyte count in the sample was 5.6 and this abnormal population comprised approximately 40% of these, so the actual number of clonal cells was 2.2k/microliter. He was referred here for further evaluation.  We have complete blood count with differentials dating back to 06/30/2009. On that date his total white cell count was 10.0, with 49% lymphocytes. On 12/14/2013 the total white cell count was 10.7 with 55% lymphocytes. On 04/27/2014 the total white cell count was 8.9, hemoglobin 14.7, platelets 194,000, and the absolute lymphocyte count was 4.6, with a neutrophil count of 3.5.  The patient's subsequent history is as detailed below  INTERVAL HISTORY: Then was evaluated in the hematology clinic 05/11/2014 accompanied by his wife Brandon Stone.  REVIEW OF SYSTEMS: Brandon Stone has had significant cardiac issues summarized in Brandon Stone note. He denies drenching sweats, unexplained fatigue, unexplained weight loss, fevers, rash, pruritus, or any bleeding or infectious problems. He is not aware of any "lumps or bumps" anywhere on his body. He complains of ringing in his ear, shortness of breath when walking up stairs, with no chest pain however, arthritis "here  and there", which is not more persistent or intense than prior. A detailed review of systems today was otherwise noncontributory.  PAST MEDICAL HISTORY: Past Medical History  Diagnosis Date  . CAD (coronary artery disease)      a. CABG 1991;  b.  [place];  c.  S/P PTCA  stents to SVG ot OM last in 2003;  d.  Redo CABG x 2 in 2004 (VG->OM->LCX, LRA->PDA);  e. 12/2011 Cath: 3vd, sev LM into LCX dzs, VG->OM 100, LIMA->DIAG->LAD patent, Rad Art->PDA patent, EF 45%, Med Rx.  . HTN (hypertension)   . Dyslipidemia   . BPH (benign prostatic hypertrophy)   . Gout   . Rheumatic fever     as a child    PAST SURGICAL HISTORY: Past Surgical History  Procedure Laterality Date  . Coronary artery bypass graft      1991 (LIMA to LAD/Diag;  SVG to RCA;  SVG to OM)  . Coronary artery bypass graft      2004 (L radial to PDA; SVG to OM1/ distal LCx)  . Tonsillectomy    . Appendectomy    . Hernia repair      FAMILY HISTORY No family history on file. The patient's father died at age 15 from heart disease in the setting of diabetes. The patient's mother died after a fall in a skilled nursing facility at the age of 66. The patient had one brother, 2 sisters. There is no history of any hematologic problems or cancers in the family to the patient's knowledge  SOCIAL HISTORY:  Brandon Stone is retired. His wife Brandon Stone owns her own business. Daughter Brandon Stone is a Oncologist in [place]. Son Brandon Knudsen  Stone is disabled for psychological reasons. He lives in [place] as well. The patient has 2 grandchildren. He attends the [employer] locally    ADVANCED DIRECTIVES: Not in place   HEALTH MAINTENANCE: History  Substance Use Topics  . Smoking status: Former Research scientist (life sciences)  . Smokeless tobacco: Never Used  . Alcohol Use: Not on file     Allergies  Allergen Reactions  . Codeine   . Other     Intolerance to strong pain medications  . Ticlopidine Hcl     Current Outpatient Prescriptions    Medication Sig Dispense Refill  . allopurinol (ZYLOPRIM) 300 MG tablet Take 300 mg by mouth daily.      Marland Kitchen amLODipine (NORVASC) 5 MG tablet Take 1 tablet (5 mg total) by mouth daily. 180 tablet 1  . aspirin 81 MG tablet Take 81 mg by mouth daily.      Marland Kitchen atorvastatin (LIPITOR) 40 MG tablet Take 1 tablet (40 mg total) by mouth daily. 90 tablet 3  . clopidogrel (PLAVIX) 75 MG tablet TAKE 1 TABLET EVERY DAY 90 tablet 2  . isosorbide mononitrate (IMDUR) 60 MG 24 hr tablet TAKE 1 TABLET EVERY DAY 90 tablet 2  . metoprolol succinate (TOPROL-XL) 50 MG 24 hr tablet TAKE 1/2 TABLET TWICE DAILY 90 tablet 2  . Multiple Vitamin (MULTIVITAMIN) capsule Take 1 capsule by mouth daily.      . nitroGLYCERIN (NITROSTAT) 0.4 MG SL tablet Place 1 tablet (0.4 mg total) under the tongue every 5 (five) minutes as needed for chest pain. 25 tablet 12  . Omega-3 Fatty Acids (FISH OIL) 1200 MG CAPS Take 1 capsule by mouth daily.      Marland Kitchen omeprazole (PRILOSEC) 20 MG capsule Take 20 mg by mouth 2 (two) times daily.      . potassium chloride SA (K-DUR,KLOR-CON) 20 MEQ tablet TAKE 1 TABLET (20 MEQ TOTAL) EVERY DAY 90 tablet 2  . triamcinolone cream (KENALOG) 0.1 % as needed.    . valsartan-hydrochlorothiazide (DIOVAN-HCT) 160-25 MG per tablet TAKE 1 TABLET EVERY DAY 90 tablet 2   No current facility-administered medications for this visit.    OBJECTIVE: Middle-aged white male in no acute distress Filed Vitals:   05/11/14 1609  BP: 151/79  Pulse: 68  Temp: 97.7 F (36.5 C)  Resp: 18     Body mass index is 31.04 kg/(m^2).    ECOG FS:0 - Asymptomatic  Ocular: Sclerae unicteric, pupils equal, round and reactive to light Ear-nose-throat: Oropharynx clear and moist Lymphatic: No cervical or supraclavicular adenopathy; no axillary or inguinal adenopathy Lungs no rales or rhonchi, good excursion bilaterally Heart regular rate and rhythm, no murmur appreciated Abd soft, nontender, positive bowel sounds, no splenomegaly MSK  no focal spinal tenderness, no joint edema Neuro: non-focal, well-oriented, appropriate affect  LAB RESULTS:  CMP     Component Value Date/Time   NA 140 04/27/2014 0815   NA 139 11/28/2013 1149   K 3.7 04/27/2014 0815   K 3.6 11/28/2013 1149   CL 102 11/28/2013 1149   CO2 26 04/27/2014 0815   CO2 29 11/28/2013 1149   GLUCOSE 250* 04/27/2014 0815   GLUCOSE 112* 11/28/2013 1149   BUN 18.5 04/27/2014 0815   BUN 18 11/28/2013 1149   CREATININE 1.2 04/27/2014 0815   CREATININE 1.0 11/28/2013 1149   CALCIUM 9.8 04/27/2014 0815   CALCIUM 9.7 11/28/2013 1149   PROT 6.4 04/27/2014 0815   PROT 7.0 06/27/2013 0849   ALBUMIN 3.7 04/27/2014 0815  ALBUMIN 4.1 06/27/2013 0849   AST 18 04/27/2014 0815   AST 25 10/03/2013 0900   ALT 25 04/27/2014 0815   ALT 27 06/27/2013 0849   ALKPHOS 71 04/27/2014 0815   ALKPHOS 54 06/27/2013 0849   BILITOT 0.67 04/27/2014 0815   BILITOT 0.8 06/27/2013 0849   GFRNONAA >60 07/01/2009 0647   GFRAA  07/01/2009 0647    >60        The eGFR has been calculated using the MDRD equation. This calculation has not been validated in all clinical situations. eGFR's persistently <60 mL/min signify possible Chronic Kidney Disease.    INo results found for: SPEP, UPEP  Lab Results  Component Value Date   WBC 8.9 04/27/2014   NEUTROABS 3.5 04/27/2014   HGB 14.7 04/27/2014   HCT 43.1 04/27/2014   MCV 91.5 04/27/2014   PLT 194 04/27/2014      Chemistry      Component Value Date/Time   NA 140 04/27/2014 0815   NA 139 11/28/2013 1149   K 3.7 04/27/2014 0815   K 3.6 11/28/2013 1149   CL 102 11/28/2013 1149   CO2 26 04/27/2014 0815   CO2 29 11/28/2013 1149   BUN 18.5 04/27/2014 0815   BUN 18 11/28/2013 1149   CREATININE 1.2 04/27/2014 0815   CREATININE 1.0 11/28/2013 1149      Component Value Date/Time   CALCIUM 9.8 04/27/2014 0815   CALCIUM 9.7 11/28/2013 1149   ALKPHOS 71 04/27/2014 0815   ALKPHOS 54 06/27/2013 0849   AST 18 04/27/2014  0815   AST 25 10/03/2013 0900   ALT 25 04/27/2014 0815   ALT 27 06/27/2013 0849   BILITOT 0.67 04/27/2014 0815   BILITOT 0.8 06/27/2013 0849       No results found for: LABCA2  No components found for: LABCA125  No results for input(s): INR in the last 168 hours.  Urinalysis    Component Value Date/Time   COLORURINE YELLOW 06/30/2009 2300   APPEARANCEUR CLEAR 06/30/2009 2300   LABSPEC 1.016 06/30/2009 2300   PHURINE 7.0 06/30/2009 2300   GLUCOSEU NEGATIVE 06/30/2009 2300   HGBUR NEGATIVE 06/30/2009 2300   BILIRUBINUR NEGATIVE 06/30/2009 2300   KETONESUR NEGATIVE 06/30/2009 2300   PROTEINUR NEGATIVE 06/30/2009 2300   UROBILINOGEN 0.2 06/30/2009 2300   NITRITE NEGATIVE 06/30/2009 2300   LEUKOCYTESUR  06/30/2009 2300    NEGATIVE MICROSCOPIC NOT DONE ON URINES WITH NEGATIVE PROTEIN, BLOOD, LEUKOCYTES, NITRITE, OR GLUCOSE <1000 mg/dL.    STUDIES: Patient: RONALD, LONDO Collected: 03/31/2014 Client: [facility] Accession: [id number] Received: 03/31/2014 Bing Quarry, MD DOB: 07/31/1945 Age: 41 Gender: M Reported: 04/03/2014 [address] Patient Ph: [phone] MRN #: [id number] [place], [place] [address] Visit #: [id number] Chart #: Phone: [phone] Fax: CC: FLOW CYTOMETRY REPORT INTERPRETATION Interpretation Peripheral Blood Flow Cytometry - ABNORMAL B-CELL POPULATION DETECTED, SEE COMMENT. Diagnosis Comment: There is an abnormal B-cell population expressing B-cell markers with co-expression of CD5 and CD23. Light chains are too dim for accurate assessment of clonality. Review of the peripheral blood smear reveals scattered atypical lymphocytes with coarse chromatin and round nuclear contours. 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 Correlation with clinical history is required. Vicente Males MD Pathologist, Electronic Signature (Case signed 04/03/2014)   ASSESSMENT: 68 y.o. [place], [place] man with an absolute lymphocyte  count of 5.9, 40% of which coexpress CD5/CD23 markers; with light chains too dim for assessment  PLAN: I spent approximately 45 minutes today with Brandon Stone and his wife going over his situation. He understands he does not quite meet criteria for chronic lymphoid leukemia. He is certainly on the borderline. 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 My expectation, though, is that this is truly an early case of CLL, and that over the next few years his lymphocyte count will continue to rise and he will easily meet the technical definition (more than 3000 clonal cells coexpressing CD5 and CD20/23 markers).  We then discussed bone marrow function, circulating blood cells, and the role of lymphocytes and general and B lymphocytes in particular. He understands while the word "leukemia" is frightening, this is generally a very indolent disease and in many patients never requires any intervention other than observation. He understands also that currently we do not know how to cure this disease other than with very extreme treatments such as bone marrow transplantation, which are not indicated here.  We discussed the symptoms and signs that would lead [place] to treatment. These include "B" symptoms, including unexplained severe fatigue, unexplained loss of 10% of more body weight, drenching sweats, intercurrent persistent fevers in the absence of demonstrable infection, or other less common systemic symptoms including severe pruritus. Indications for treatment also include anemia and thrombocytopenia not do to immune dysregulation (warm antibody hemolytic anemia and ITP are not indications for treatment of the CLL as such). On the other hand anemia or  thrombocytopenia not due to immunological causes would lead to bone marrow biopsy to determine whether crowding of the bone marrow is the cause, in which case treatment would be instituted.  Brandon Stone has a very good understanding of this situation. He was reassured by the today's discussion. I do not feel he requires hematologic follow-up at present, since he sees Brandon Stone very regularly and she follows his blood counts on a routine basis. Certainly if any of the above symptoms or signs were to develop, or if his white cell count rises persistently above 50,000, he would be referred back for further evaluation.  Brandon Stone has a good understanding of the overall plan. He agrees with it. He will call with any problems that he thinks may be related to his hematologic condition but as of now we are making no routine appointments for him here.    Brandon Cruel, MD   05/13/2014 10:16 AM

## 2014-08-01 ENCOUNTER — Encounter: Payer: Self-pay | Admitting: Internal Medicine

## 2014-08-25 ENCOUNTER — Ambulatory Visit (INDEPENDENT_AMBULATORY_CARE_PROVIDER_SITE_OTHER): Payer: Medicare HMO | Admitting: Internal Medicine

## 2014-08-25 ENCOUNTER — Encounter: Payer: Self-pay | Admitting: Internal Medicine

## 2014-08-25 VITALS — BP 138/70 | HR 64 | Ht 72.0 in | Wt 228.4 lb

## 2014-08-25 DIAGNOSIS — I10 Essential (primary) hypertension: Secondary | ICD-10-CM

## 2014-08-25 DIAGNOSIS — R0602 Shortness of breath: Secondary | ICD-10-CM

## 2014-08-25 DIAGNOSIS — E785 Hyperlipidemia, unspecified: Secondary | ICD-10-CM

## 2014-08-25 DIAGNOSIS — R0789 Other chest pain: Secondary | ICD-10-CM

## 2014-08-25 LAB — LIPID PANEL
CHOL/HDL RATIO: 4
Cholesterol: 178 mg/dL (ref 0–200)
HDL: 46.7 mg/dL (ref >39.00)
LDL CALC: 106 mg/dL — AB (ref 0–99)
NonHDL: 131.3
TRIGLYCERIDES: 125 mg/dL (ref 0.0–149.0)
VLDL: 25 mg/dL (ref 0.0–40.0)

## 2014-08-25 LAB — AST: AST: 16 U/L (ref 0–37)

## 2014-08-25 NOTE — Patient Instructions (Signed)
Your physician recommends that you continue on your current medications as directed. Please refer to the Current Medication list given to you today. Your physician recommends that you return for lab work TODAY (LIPIDS, AST)  Your physician has requested that you have en exercise stress myoview. For further information please visit [email or web address]. Please follow instruction sheet, as given.  PLEASE SCHEDULE FOR IN ABOUT A MONTH.

## 2014-08-25 NOTE — Progress Notes (Signed)
Cardiology Office Note   Date:  08/25/2014   ID:  Brandon Stone, DOB 03/28/1946, MRN [id number]  PCP:  Janie Morning, DO  Cardiologist:   Dorris Carnes, MD   Patinet presents for continued care of CAD      History of Present Illness: Brandon Stone is a 69 y.o. male with a history of CAD (s/p CABG in 35; IWMI in 1998. Redo CABG in 2004 (SVG to OM/LCx; LRA to PDA). Cath in July 2013 due to increased CP showed severe LM disease into Lcx.; SVG to OM 100%; LIMA to Diag and LAD patent; Radial Artery to PDA patent LVEF 45% Plan was to continue medical Rx.  patinet does note some SOB and tightness with walking from mailbox  Sometimes  Not all the time.   Actvity is down with weather Now recovering from bronchitis   No chest tightness with rest. No PND    Current Outpatient Prescriptions  Medication Sig Dispense Refill  . allopurinol (ZYLOPRIM) 300 MG tablet Take 300 mg by mouth daily.      Marland Kitchen amLODipine (NORVASC) 5 MG tablet Take 1 tablet (5 mg total) by mouth daily. 180 tablet 1  . aspirin 81 MG tablet Take 81 mg by mouth daily.      Marland Kitchen atorvastatin (LIPITOR) 40 MG tablet Take 1 tablet (40 mg total) by mouth daily. 90 tablet 3  . clopidogrel (PLAVIX) 75 MG tablet TAKE 1 TABLET EVERY DAY 90 tablet 2  . isosorbide mononitrate (IMDUR) 60 MG 24 hr tablet TAKE 1 TABLET EVERY DAY 90 tablet 2  . metoprolol succinate (TOPROL-XL) 50 MG 24 hr tablet TAKE 1/2 TABLET TWICE DAILY 90 tablet 2  . Multiple Vitamin (MULTIVITAMIN) capsule Take 1 capsule by mouth daily.      . nitroGLYCERIN (NITROSTAT) 0.4 MG SL tablet Place 1 tablet (0.4 mg total) under the tongue every 5 (five) minutes as needed for chest pain. 25 tablet 12  . Omega-3 Fatty Acids (FISH OIL) 1200 MG CAPS Take 1 capsule by mouth daily.      Marland Kitchen omeprazole (PRILOSEC) 20 MG capsule Take 20 mg by mouth 2 (two) times daily.      . potassium chloride SA (K-DUR,KLOR-CON) 20 MEQ tablet TAKE 1 TABLET (20 MEQ TOTAL) EVERY DAY 90 tablet 2  .  triamcinolone cream (KENALOG) 0.1 % as needed.    . valsartan-hydrochlorothiazide (DIOVAN-HCT) 160-25 MG per tablet TAKE 1 TABLET EVERY DAY 90 tablet 2   No current facility-administered medications for this visit.    Allergies:   Codeine; Other; and Ticlopidine hcl   Past Medical History  Diagnosis Date  . CAD (coronary artery disease)      a. CABG 1991;  b.  [place];  c.  S/P PTCA  stents to SVG ot OM last in 2003;  d.  Redo CABG x 2 in 2004 (VG->OM->LCX, LRA->PDA);  e. 12/2011 Cath: 3vd, sev LM into LCX dzs, VG->OM 100, LIMA->DIAG->LAD patent, Rad Art->PDA patent, EF 45%, Med Rx.  . HTN (hypertension)   . Dyslipidemia   . BPH (benign prostatic hypertrophy)   . Gout   . Rheumatic fever     as a child    Past Surgical History  Procedure Laterality Date  . Coronary artery bypass graft      1991 (LIMA to LAD/Diag;  SVG to RCA;  SVG to OM)  . Coronary artery bypass graft      2004 (L radial to PDA; SVG to OM1/ distal LCx)  .  Tonsillectomy    . Appendectomy    . Hernia repair       Social History:  The patient  reports that he has quit smoking. He has never used smokeless tobacco. He reports that he does not use illicit drugs.   Family History:  The patient's family history includes Cancer in his mother; Diabetes in his father and mother; Heart disease in his father and mother; Hyperlipidemia in his father and mother; Hypertension in his father and mother.    ROS:  Please see the history of present illness. All other systems are reviewed and  Negative to the above problem except as noted.    PHYSICAL EXAM: VS:  BP 138/70 mmHg  Pulse 64  Ht 6' (1.829 m)  Wt 228 lb 6.4 oz (103.602 kg)  BMI 30.97 kg/m2  GEN: Well nourished, well developed, in no acute distress HEENT: normal Neck: no JVD, carotid bruits, or masses Cardiac: RRR; no murmurs, rubs, or gallops,no edema  Respiratory:  clear to auscultation bilaterally, normal work of breathing GI: soft, nontender,  nondistended, + BS  No hepatomegaly  MS: no deformity Moving all extremities   Skin: warm and dry, no rash Neuro:  Strength and sensation are intact Psych: euthymic mood, full affect   EKG:  EKG is not ordered today.   Lipid Panel    Component Value Date/Time   CHOL 174 10/03/2013 0900   TRIG 284.0* 10/03/2013 0900   HDL 36.90* 10/03/2013 0900   CHOLHDL 5 10/03/2013 0900   VLDL 56.8* 10/03/2013 0900   LDLCALC 80 10/03/2013 0900   LDLDIRECT 119.2 06/27/2013 0849      Wt Readings from Last 3 Encounters:  08/25/14 228 lb 6.4 oz (103.602 kg)  05/11/14 228 lb 14.4 oz (103.828 kg)  11/28/13 228 lb (103.42 kg)      ASSESSMENT AND PLAN:  1.  CAD  Remote CABG  Symptoms of dyspnea.  WIll set up for stress myoview once he recovers from bronchitis  2.  HL  Keep on statin   Current medicines are reviewed at length with the patient today.  The patient does not have concerns regarding medicines.  The following changes have been made:    Disposition:   FU with  in   Signed, Dorris Carnes, MD  08/25/2014 10:47 AM    [facility], [place], [place]  [address] Phone: [phone]; Fax: [phone]

## 2014-08-28 ENCOUNTER — Telehealth: Payer: Self-pay | Admitting: *Deleted

## 2014-08-28 DIAGNOSIS — E785 Hyperlipidemia, unspecified: Secondary | ICD-10-CM

## 2014-08-28 DIAGNOSIS — I10 Essential (primary) hypertension: Secondary | ICD-10-CM

## 2014-08-28 NOTE — Telephone Encounter (Signed)
Provided patient with with results and changes advised from MD. Patient agreeable to trying Zetia 5 mg by mouth daily and to f/u in 4-6 weeks for lipid panel. Samples left at front desk for patient to pick up on 08/29/14.

## 2014-08-29 NOTE — Telephone Encounter (Signed)
Keep on other meds and add Zetia to med regimen  F/U lipids in 8 wk

## 2014-08-30 MED ORDER — EZETIMIBE 10 MG PO TABS: 5.0000 mg | ORAL_TABLET | Freq: Every day | ORAL | Status: DC

## 2014-08-30 NOTE — Telephone Encounter (Signed)
Discussed with patient. Verbalizes understanding to add zetia to current medications, schedule repeat lipids in 8 weeks. He will make appointment for labs when he comes for stress test next month. Picked up samples already.

## 2014-09-05 ENCOUNTER — Other Ambulatory Visit: Payer: Self-pay

## 2014-09-05 MED ORDER — VALSARTAN-HYDROCHLOROTHIAZIDE 160-25 MG PO TABS: 1.0000 | ORAL_TABLET | Freq: Every day | ORAL | Status: DC

## 2014-09-05 MED ORDER — ATORVASTATIN CALCIUM 40 MG PO TABS: 40.0000 mg | ORAL_TABLET | Freq: Every day | ORAL | Status: DC

## 2014-09-05 MED ORDER — CLOPIDOGREL BISULFATE 75 MG PO TABS: 75.0000 mg | ORAL_TABLET | Freq: Every day | ORAL | Status: DC

## 2014-09-06 ENCOUNTER — Other Ambulatory Visit: Payer: Self-pay

## 2014-09-06 MED ORDER — METOPROLOL SUCCINATE ER 25 MG PO TB24: 25.0000 mg | ORAL_TABLET | Freq: Two times a day (BID) | ORAL | Status: DC

## 2014-09-06 MED ORDER — AMLODIPINE BESYLATE 5 MG PO TABS: 5.0000 mg | ORAL_TABLET | Freq: Every day | ORAL | Status: DC

## 2014-09-06 MED ORDER — POTASSIUM CHLORIDE CRYS ER 20 MEQ PO TBCR: EXTENDED_RELEASE_TABLET | ORAL | Status: DC

## 2014-09-06 MED ORDER — ISOSORBIDE MONONITRATE ER 60 MG PO TB24: 60.0000 mg | ORAL_TABLET | Freq: Every day | ORAL | Status: DC

## 2014-09-25 ENCOUNTER — Ambulatory Visit (HOSPITAL_COMMUNITY): Payer: Medicare HMO | Attending: Cardiology | Admitting: Radiology

## 2014-09-25 DIAGNOSIS — R0602 Shortness of breath: Secondary | ICD-10-CM | POA: Diagnosis present

## 2014-09-25 DIAGNOSIS — R0789 Other chest pain: Secondary | ICD-10-CM

## 2014-09-25 DIAGNOSIS — I10 Essential (primary) hypertension: Secondary | ICD-10-CM | POA: Insufficient documentation

## 2014-09-25 DIAGNOSIS — E785 Hyperlipidemia, unspecified: Secondary | ICD-10-CM

## 2014-09-25 MED ORDER — TECHNETIUM TC 99M SESTAMIBI GENERIC - CARDIOLITE
11.0000 | Freq: Once | INTRAVENOUS | Status: AC | PRN
  Administered 2014-09-25: 11 via INTRAVENOUS

## 2014-09-25 MED ORDER — TECHNETIUM TC 99M SESTAMIBI GENERIC - CARDIOLITE
33.0000 | Freq: Once | INTRAVENOUS | Status: AC | PRN
  Administered 2014-09-25: 33 via INTRAVENOUS

## 2014-09-25 NOTE — Progress Notes (Signed)
[facility] [address] [place], [place] [address] [phone]    Cardiology Nuclear Med Study  Brandon Stone is a 69 y.o. male     MRN : [id number]     DOB: September 16, 1945  Procedure Date: 09/25/2014  Nuclear Med Background Indication for Stress Test:  Evaluation for Ischemia and Graft/Stent Patency History:  CAD, MPI 2004 Cardiac Risk Factors: Strong premature Family History - CAD and Hypertension  Symptoms:  Chest Pain and DOE   Nuclear Pre-Procedure Caffeine/Decaff Intake:  None> 12hrs NPO After: 8:00pm   Lungs:  clear O2 Sat: 96% on room air. IV 0.9% NS with Angio Cath:  22g  IV Site: R Antecubital x 1, tolerated well IV Started by:  Irven Baltimore, RN  Chest Size (in):  46 Cup Size: n/a  Height: 6' (1.829 m)  Weight:  216 lb (97.977 kg)  BMI:  Body mass index is 29.29 kg/(m^2). Tech Comments:  Patient held Toprol x 36 hrs. Irven Baltimore, RN.    Nuclear Med Study 1 or 2 day study: 1 day  Stress Test Type:  Stress  Reading MD: N/A  Order Authorizing Provider:  Dorris Carnes, MD  Resting Radionuclide: Technetium 20m Sestamibi  Resting Radionuclide Dose: 11.0 mCi   Stress Radionuclide:  Technetium 20m Sestamibi  Stress Radionuclide Dose: 33.0 mCi           Stress Protocol Rest HR: 62 Stress HR: 139  Rest BP: 147/85 Stress BP: 196/88  Exercise Time (min): 7:30 METS: 9.3   Predicted Max HR: 154 bpm % Max HR: 90.26 bpm Rate Pressure Product: 27244   Dose of Adenosine (mg):  n/a Dose of Lexiscan: n/a mg  Dose of Atropine (mg): n/a Dose of Dobutamine: n/a mcg/kg/min (at max HR)  Stress Test Technologist: Glade Lloyd, BS-ES  Nuclear Technologist:  Earl Many, CNMT     Rest Procedure:  Myocardial perfusion imaging was performed at rest 45 minutes following the intravenous administration of Technetium 62m Sestamibi. Rest ECG: NSR with non-specific ST-T wave changes  Stress Procedure:  The patient exercised on the treadmill utilizing the  Bruce Protocol for 7:30 minutes. The patient stopped due to fatigue and had a 2/10 chest pain that radiated into his back and left shoulder.  This resolved completed three minutes into recovery.  Technetium 31m Sestamibi was injected at peak exercise and myocardial perfusion imaging was performed after a brief delay. Stress ECG: No significant change from baseline ECG  QPS Raw Data Images:  Normal; no motion artifact; normal heart/lung ratio. Stress Images:  There is decreased uptake in the inferior wall. Rest Images:  There is decreased uptake in the inferior wall. Subtraction (SDS):  There is a fixed defect that is most consistent with a previous infarction. Transient Ischemic Dilatation (Normal <1.22):  0.99 Lung/Heart Ratio (Normal <0.45):  0.29  Quantitative Gated Spect Images QGS EDV:  110 ml QGS ESV:  60 ml  Impression Exercise Capacity:  Fair exercise capacity. BP Response:  Normal blood pressure response. Clinical Symptoms:  Mild chest pain/dyspnea. ECG Impression:  Nonspecific ST-T changes in inferolateral leads with exercise. Comparison with Prior Nuclear Study: No images to compare  Overall Impression:  Intermediate risk stress nuclear study.  There is a defect of moderate severity involving the inferoseptal, inferior and inferolateral wall consistent with old scar.  There is minimal reversibility. SDS = 3.  Patient experienced mild 2/10 chest pain radiated to back and left shoulder. Nonspecific changes at rest become  more prominent with exercise. There is mild LV systolic dysfunction with mild inferior wall hypokinesis.  LV Ejection Fraction: 46%.  LV Wall Motion:  Mild inferior wall hypokinesis.  Darlin Coco MD

## 2014-11-07 ENCOUNTER — Other Ambulatory Visit (INDEPENDENT_AMBULATORY_CARE_PROVIDER_SITE_OTHER): Payer: Medicare HMO | Admitting: *Deleted

## 2014-11-07 DIAGNOSIS — E785 Hyperlipidemia, unspecified: Secondary | ICD-10-CM

## 2014-11-07 LAB — LIPID PANEL
CHOL/HDL RATIO: 4
CHOLESTEROL: 144 mg/dL (ref 0–200)
HDL: 38 mg/dL — ABNORMAL LOW (ref >39.00)
LDL Cholesterol: 73 mg/dL (ref 0–99)
NonHDL: 106
TRIGLYCERIDES: 163 mg/dL — AB (ref 0.0–149.0)
VLDL: 32.6 mg/dL (ref 0.0–40.0)

## 2014-11-09 ENCOUNTER — Other Ambulatory Visit: Payer: Self-pay | Admitting: *Deleted

## 2014-11-09 MED ORDER — EZETIMIBE 10 MG PO TABS: 5.0000 mg | ORAL_TABLET | Freq: Every day | ORAL | Status: DC

## 2014-11-14 ENCOUNTER — Telehealth: Payer: Self-pay | Admitting: Internal Medicine

## 2014-11-14 NOTE — Telephone Encounter (Signed)
New Message   Patient has a question about his cholesterol medication. Please give patient a call.

## 2014-11-14 NOTE — Telephone Encounter (Signed)
Spoke with patient who called to ask if he should continue to take both Zetia and Atorvastatin.  I reviewed patient's chart and advised him that per Dr. Alan Ripper advice following lab work done in March, he should add Zetia to his regimen due to LDL >100.  He had lab rechecked 5/17 and Dr. Harrington Challenger advised to continue current treatment plan.  Patient verbalized understanding and agreement and thanked me for the call.

## 2014-11-29 ENCOUNTER — Other Ambulatory Visit: Payer: Self-pay | Admitting: Internal Medicine

## 2015-02-15 ENCOUNTER — Telehealth: Payer: Self-pay

## 2015-02-15 NOTE — Telephone Encounter (Signed)
Mr. Beckmann called in asking if he can have his Zetia changed to another medication due to cost. He would like a call back from nurse to discuss. [phone]

## 2015-02-20 NOTE — Telephone Encounter (Signed)
Pt taking lipitor and zetia according to med list--last lipids in May, 2016.   Will route to Dr. Harrington Challenger to review, make any recommendations.

## 2015-02-21 NOTE — Telephone Encounter (Signed)
Hold Zetia. Call back in Jan   It should be generic then No other med to replace for now.

## 2015-02-22 NOTE — Telephone Encounter (Signed)
Continues atorvastatin. 

## 2015-02-22 NOTE — Addendum Note (Signed)
Addended by: Rodman Key on: 02/22/2015 01:05 PM   Modules accepted: Medications

## 2015-02-22 NOTE — Telephone Encounter (Signed)
Pt informed.  Verbalizes understanding and agreement.  Will call back in January to discuss restarting generic zetia.

## 2015-05-31 ENCOUNTER — Other Ambulatory Visit: Payer: Self-pay | Admitting: Internal Medicine

## 2015-07-17 ENCOUNTER — Telehealth: Payer: Self-pay | Admitting: Internal Medicine

## 2015-07-17 NOTE — Telephone Encounter (Signed)
New problem   Pt was told by Dr Harrington Challenger to have lab drawn in Jan, but no order was put in. Please advise pt.

## 2015-07-19 NOTE — Telephone Encounter (Signed)
Patient currently taking only Lipitor, (not Zetia and Lipitor).   He asks about having lipids rechecked. He is due in March for f/u with Dr. Harrington Challenger. Appointment scheduled.  Advised MD will order any labs needed at that time. Pt will come fasting.

## 2015-08-20 ENCOUNTER — Other Ambulatory Visit: Payer: Self-pay | Admitting: Internal Medicine

## 2015-08-26 NOTE — Progress Notes (Signed)
Cardiology Office Note   Date:  08/27/2015   ID:  Brandon Stone, DOB 06/20/1946, MRN [id number]  PCP:  Janie Morning, DO  Cardiologist:   Dorris Carnes, MD   F/U of CAD     History of Present Illness: Brandon Stone is a 70 y.o. male with a history of CAD (s/p CABG in 1999; [place] in 1998. Redo CABG in 2004 (SVG to OM/LCx; LRA to PDA). Cath in July 2013 due to increased CP showed severe LM disease into Lcx.; SVG to OM 100%; LIMA to Diag and LAD patent; Radial Artery to PDA patent LVEF 45% Plan was to continue medical Rx. I saw the pt in march 2016  Since seen his breahting has been good   He says that when he starts walking he will have some chest pain  It goes away with continued walking  Has been like this for awhile  No heart racing  Outpatient Prescriptions Prior to Visit  Medication Sig Dispense Refill  . allopurinol (ZYLOPRIM) 300 MG tablet Take 300 mg by mouth daily.      Marland Kitchen amLODipine (NORVASC) 5 MG tablet TAKE 1 TABLET EVERY DAY 90 tablet 0  . aspirin 81 MG tablet Take 81 mg by mouth daily.      Marland Kitchen atorvastatin (LIPITOR) 40 MG tablet TAKE 1 TABLET EVERY DAY 90 tablet 0  . clopidogrel (PLAVIX) 75 MG tablet TAKE 1 TABLET EVERY DAY 90 tablet 0  . isosorbide mononitrate (IMDUR) 60 MG 24 hr tablet TAKE 1 TABLET EVERY DAY 90 tablet 0  . metoprolol succinate (TOPROL-XL) 25 MG 24 hr tablet TAKE 1 TABLET TWICE DAILY  WITH  OR  IMMEDIATELY FOLLOWING A MEAL 180 tablet 0  . Multiple Vitamin (MULTIVITAMIN) capsule Take 1 capsule by mouth daily.      . nitroGLYCERIN (NITROSTAT) 0.4 MG SL tablet PLACE 1 TABLET (0.4 MG TOTAL) UNDER THE TONGUE EVERY 5 (FIVE) MINUTES AS NEEDED FOR CHEST PAIN. 25 tablet 12  . Omega-3 Fatty Acids (FISH OIL) 1200 MG CAPS Take 1 capsule by mouth daily.      Marland Kitchen omeprazole (PRILOSEC) 20 MG capsule Take 20 mg by mouth 2 (two) times daily.      . potassium chloride SA (K-DUR,KLOR-CON) 20 MEQ tablet TAKE 1 TABLET EVERY DAY 90 tablet 0  . triamcinolone cream (KENALOG) 0.1  % as needed.    . valsartan-hydrochlorothiazide (DIOVAN-HCT) 160-25 MG tablet TAKE 1 TABLET EVERY DAY 90 tablet 0  . ezetimibe (ZETIA) 10 MG tablet Take 0.5 tablets (5 mg total) by mouth daily. (Patient not taking: Reported on 08/27/2015) 45 tablet 1   No facility-administered medications prior to visit.     Allergies:   Codeine; Other; and Ticlopidine hcl   Past Medical History  Diagnosis Date  . CAD (coronary artery disease)      a. CABG 1991;  b.  [place];  c.  S/P PTCA  stents to SVG ot OM last in 2003;  d.  Redo CABG x 2 in 2004 (VG->OM->LCX, LRA->PDA);  e. 12/2011 Cath: 3vd, sev LM into LCX dzs, VG->OM 100, LIMA->DIAG->LAD patent, Rad Art->PDA patent, EF 45%, Med Rx.  . HTN (hypertension)   . Dyslipidemia   . BPH (benign prostatic hypertrophy)   . Gout   . Rheumatic fever     as a child    Past Surgical History  Procedure Laterality Date  . Coronary artery bypass graft      1991 (LIMA to LAD/Diag;  SVG to  RCA;  SVG to OM)  . Coronary artery bypass graft      2004 (L radial to PDA; SVG to OM1/ distal LCx)  . Tonsillectomy    . Appendectomy    . Hernia repair       Social History:  The patient  reports that he has quit smoking. He has never used smokeless tobacco. He reports that he does not use illicit drugs.   Family History:  The patient's family history includes Cancer in his mother; Diabetes in his father and mother; Heart disease in his father and mother; Hyperlipidemia in his father and mother; Hypertension in his father and mother.    ROS:  Please see the history of present illness. All other systems are reviewed and  Negative to the above problem except as noted.    PHYSICAL EXAM: VS:  BP 140/78 mmHg  Pulse 64  Ht 6' (1.829 m)  Wt 219 lb (99.338 kg)  BMI 29.70 kg/m2  GEN: Well nourished, well developed, in no acute distress HEENT: normal Neck: no JVD, carotid bruits, or masses Cardiac: RRR; no murmurs, rubs, or gallops,no edema  Respiratory:  clear to  auscultation bilaterally, normal work of breathing GI: soft, nontender, nondistended, + BS  No hepatomegaly  MS: no deformity Moving all extremities   Skin: warm and dry, no rash Neuro:  Strength and sensation are intact Psych: euthymic mood, full affect   EKG:  EKG is ordered today.  SR 63 bpm  IWMI    Lipid Panel    Component Value Date/Time   CHOL 144 11/07/2014 0733   TRIG 163.0* 11/07/2014 0733   HDL 38.00* 11/07/2014 0733   CHOLHDL 4 11/07/2014 0733   VLDL 32.6 11/07/2014 0733   LDLCALC 73 11/07/2014 0733   LDLDIRECT 119.2 06/27/2013 0849      Wt Readings from Last 3 Encounters:  08/27/15 219 lb (99.338 kg)  09/25/14 216 lb (97.977 kg)  08/25/14 228 lb 6.4 oz (103.602 kg)      ASSESSMENT AND PLAN:  1.  CAD  Pt with stable angina  I would continue medical Rx    2.  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 TSH  . Lipid panel  . Comprehensive metabolic panel  . PSA  . HgB A1c  . CBC w/Diff  . EKG 12-Lead        Signed, Dorris Carnes, MD  08/27/2015 8:47 AM    [facility] [place], [place], [place]  [address] Phone: [phone]; Fax: [phone]

## 2015-08-27 ENCOUNTER — Ambulatory Visit (INDEPENDENT_AMBULATORY_CARE_PROVIDER_SITE_OTHER): Payer: Medicare HMO | Admitting: Internal Medicine

## 2015-08-27 ENCOUNTER — Encounter: Payer: Self-pay | Admitting: Internal Medicine

## 2015-08-27 VITALS — BP 140/78 | HR 64 | Ht 72.0 in | Wt 219.0 lb

## 2015-08-27 DIAGNOSIS — I2510 Atherosclerotic heart disease of native coronary artery without angina pectoris: Secondary | ICD-10-CM

## 2015-08-27 DIAGNOSIS — E785 Hyperlipidemia, unspecified: Secondary | ICD-10-CM

## 2015-08-27 DIAGNOSIS — I10 Essential (primary) hypertension: Secondary | ICD-10-CM | POA: Diagnosis not present

## 2015-08-27 LAB — LIPID PANEL
CHOL/HDL RATIO: 4.9 ratio (ref <=5.0)
Cholesterol: 182 mg/dL (ref 125–200)
HDL: 37 mg/dL — AB (ref >=40)
LDL CALC: 93 mg/dL (ref <130)
Triglycerides: 260 mg/dL — ABNORMAL HIGH (ref <150)
VLDL: 52 mg/dL — AB (ref <30)

## 2015-08-27 LAB — CBC WITH DIFFERENTIAL/PLATELET
BASOS PCT: 0 % (ref 0–1)
Basophils Absolute: 0 10*3/uL (ref 0.0–0.1)
EOS ABS: 0.1 10*3/uL (ref 0.0–0.7)
EOS PCT: 1 % (ref 0–5)
HCT: 45.3 % (ref 39.0–52.0)
Hemoglobin: 15.6 g/dL (ref 13.0–17.0)
LYMPHS ABS: 5.5 10*3/uL — AB (ref 0.7–4.0)
Lymphocytes Relative: 53 % — ABNORMAL HIGH (ref 12–46)
MCH: 31.2 pg (ref 26.0–34.0)
MCHC: 34.4 g/dL (ref 30.0–36.0)
MCV: 90.6 fL (ref 78.0–100.0)
MONOS PCT: 7 % (ref 3–12)
MPV: 9.8 fL (ref 8.6–12.4)
Monocytes Absolute: 0.7 10*3/uL (ref 0.1–1.0)
Neutro Abs: 4.1 10*3/uL (ref 1.7–7.7)
Neutrophils Relative %: 39 % — ABNORMAL LOW (ref 43–77)
PLATELETS: 220 10*3/uL (ref 150–400)
RBC: 5 MIL/uL (ref 4.22–5.81)
RDW: 13.2 % (ref 11.5–15.5)
WBC: 10.4 10*3/uL (ref 4.0–10.5)

## 2015-08-27 LAB — HEMOGLOBIN A1C
HEMOGLOBIN A1C: 7.1 % — AB (ref <5.7)
MEAN PLASMA GLUCOSE: 157 mg/dL — AB (ref <117)

## 2015-08-27 LAB — COMPREHENSIVE METABOLIC PANEL
ALBUMIN: 4.3 g/dL (ref 3.6–5.1)
ALT: 19 U/L (ref 9–46)
AST: 20 U/L (ref 10–35)
Alkaline Phosphatase: 59 U/L (ref 40–115)
BUN: 14 mg/dL (ref 7–25)
CHLORIDE: 100 mmol/L (ref 98–110)
CO2: 26 mmol/L (ref 20–31)
CREATININE: 1.04 mg/dL (ref 0.70–1.25)
Calcium: 9.5 mg/dL (ref 8.6–10.3)
GLUCOSE: 120 mg/dL — AB (ref 65–99)
Potassium: 3.8 mmol/L (ref 3.5–5.3)
SODIUM: 142 mmol/L (ref 135–146)
Total Bilirubin: 1.1 mg/dL (ref 0.2–1.2)
Total Protein: 6.6 g/dL (ref 6.1–8.1)

## 2015-08-27 LAB — TSH: TSH: 1.6 m[IU]/L (ref 0.40–4.50)

## 2015-08-27 NOTE — Patient Instructions (Signed)
Your physician recommends that you continue on your current medications as directed. Please refer to the Current Medication list given to you today.  Your physician recommends that you return for lab work in: today (CMET, CBC W/ DIFF, HGA1C, TSH, PSA, LIPIDS)  Your physician wants you to follow-up in: [place].  You will receive a reminder letter in the mail two months in advance. If you don't receive a letter, please call our office to schedule the follow-up appointment.

## 2015-08-28 LAB — PSA: PSA: 0.82 ng/mL (ref <=4.00)

## 2015-08-29 ENCOUNTER — Telehealth: Payer: Self-pay | Admitting: *Deleted

## 2015-08-29 DIAGNOSIS — E785 Hyperlipidemia, unspecified: Secondary | ICD-10-CM

## 2015-08-29 MED ORDER — ATORVASTATIN CALCIUM 80 MG PO TABS: 80.0000 mg | ORAL_TABLET | Freq: Every day | ORAL | Status: DC

## 2015-08-29 NOTE — Telephone Encounter (Signed)
Notified the pt of lab results per Dr Harrington Challenger.  Informed the pt to increase his lipitor to 80 mg once daily.  Confirmed the pharmacy of choice with the pt.  Scheduled the pt fasting lipids for 2 months out, on 10/29/15 at our office.  Will route this message to pts PCP Dr Theda Sers for further review.  Pt verbalized understanding and agrees with this plan.

## 2015-08-29 NOTE — Telephone Encounter (Signed)
-----   Message from Fay Records, MD sent at 08/28/2015  7:27 AM EST ----- Thyoid function is normal PSA normal Glu is elevated on average  Hgb A1C is 7.1  Needs to get tighter control of sugars CBC is stable  Total white count is normal  There is a predominance of lymphocytes but this is stable since last checked Lipids:  LDL is higher than it should be  Last year was 73  Now 93.  Would try 1.  Modify diet for fats and sugars.  2.  Increase lipitor to 80 F/U lipid panel in 2 months   Please forward to pts primary MD Dr Theda Sers

## 2015-08-31 ENCOUNTER — Other Ambulatory Visit: Payer: Self-pay

## 2015-10-29 ENCOUNTER — Other Ambulatory Visit (INDEPENDENT_AMBULATORY_CARE_PROVIDER_SITE_OTHER): Payer: Medicare HMO | Admitting: *Deleted

## 2015-10-29 ENCOUNTER — Other Ambulatory Visit: Payer: Self-pay | Admitting: Internal Medicine

## 2015-10-29 DIAGNOSIS — E785 Hyperlipidemia, unspecified: Secondary | ICD-10-CM

## 2015-10-29 LAB — LIPID PANEL
Cholesterol: 160 mg/dL (ref 125–200)
HDL: 37 mg/dL — ABNORMAL LOW (ref >=40)
LDL CALC: 81 mg/dL (ref <130)
Total CHOL/HDL Ratio: 4.3 Ratio (ref <=5.0)
Triglycerides: 211 mg/dL — ABNORMAL HIGH (ref <150)
VLDL: 42 mg/dL — AB (ref <30)

## 2015-10-31 ENCOUNTER — Other Ambulatory Visit: Payer: Self-pay | Admitting: Internal Medicine

## 2015-10-31 ENCOUNTER — Other Ambulatory Visit: Payer: Self-pay | Admitting: *Deleted

## 2016-01-07 ENCOUNTER — Encounter: Payer: Self-pay | Admitting: Internal Medicine

## 2016-01-11 ENCOUNTER — Encounter: Payer: Self-pay | Admitting: Internal Medicine

## 2016-02-08 ENCOUNTER — Telehealth: Payer: Self-pay | Admitting: Internal Medicine

## 2016-02-08 NOTE — Telephone Encounter (Signed)
FORWARD  TO  DR ROSS FOR  REVIEW .Brandon Stone

## 2016-02-08 NOTE — Telephone Encounter (Signed)
Request for surgical clearance:  1. What type of surgery is being performed? Gallbladder    2. When is this surgery scheduled? Pending    3. Are there any medications that need to be held prior to surgery and how long?Pending    4. Name of physician performing surgery? Dr. Vianne Bulls [place]    5. What is your office phone and fax number? Ph#[phone] fax#[phone]

## 2016-02-12 ENCOUNTER — Telehealth: Payer: Self-pay | Admitting: Internal Medicine

## 2016-02-12 NOTE — Telephone Encounter (Signed)
New message       Request for surgical clearance:  1. What type of surgery is being performed? colonoscopy  When is this surgery scheduled? No  2. Are there any medications that need to be held prior to surgery and how long? Blood thinner have to be held for 5 days prior to the procedure  3. Name of physician performing surgery? Dr. Melynda Ripple  4. What is your office phone and fax number? [phone], [phone]

## 2016-02-13 NOTE — Telephone Encounter (Signed)
OK to hold plavix and aspirin (if needed) 5days prior to colonoscopy

## 2016-02-13 NOTE — Telephone Encounter (Signed)
Spoke with Lavella Lemons at Dr. Carlos Levering office to clarify procedure.  He is needing a cholecystectomy, not a colonoscopy.  Advised that Dr. Harrington Challenger is aware that patient needs clearance for gallbladder surgery. I will route this message to her to inform that he does not have a colonoscopy planned. Pt was last seen by Dr. Harrington Challenger in March, 2017.

## 2016-02-15 NOTE — Telephone Encounter (Signed)
Per Dr. Harrington Challenger, she has called and spoken with patient and surgeon.

## 2016-02-20 ENCOUNTER — Telehealth: Payer: Self-pay | Admitting: Internal Medicine

## 2016-02-20 NOTE — Telephone Encounter (Signed)
New message      Pt calling to confirm that his medical records from [facility] in [place]. Please call.

## 2016-02-27 NOTE — Telephone Encounter (Signed)
Patient was supposed to have surgery records sent up to me Please confirm if arrived. I was hoping to have before talking to gen surgery

## 2016-02-27 NOTE — Telephone Encounter (Signed)
Called patient to clarify the message below. He has requested from Dr. Loletha Carrow office ([place]), his records be sent to Dr. Harrington Challenger (abd [place], abd XRAY and abd CT).  Verified with Maudie Mercury in medical records that they have not been received.    Patient has cancelled his gallbladder surgery had been scheduled with Dr. Corena Pilgrim.

## 2016-03-03 NOTE — Telephone Encounter (Signed)
Records received and have been scanned into EPIC, Dr. Harrington Challenger aware.

## 2016-03-03 NOTE — Telephone Encounter (Signed)
Records received and scanned into EPIC.  Dr. Harrington Challenger aware.

## 2016-03-06 ENCOUNTER — Telehealth: Payer: Self-pay | Admitting: *Deleted

## 2016-03-06 ENCOUNTER — Encounter: Payer: Self-pay | Admitting: Internal Medicine

## 2016-03-06 NOTE — Telephone Encounter (Signed)
-----   Message from Fay Records, MD sent at 03/06/2016 10:10 AM EDT ----- Regarding: appt   ----- Message ----- From: Jackolyn Confer, MD Sent: 03/03/2016   4:19 PM To: April Linus Orn, MD  Yes, I would be happy to.  Please have your assistant or someone call my office and ask for assistant April.  She can make the appointment.  Todd ----- Message ----- From: Fay Records, MD Sent: 03/03/2016   9:03 AM To: Jackolyn Confer, MD  Sherren Mocha This is a pt of mine  He was being evaluated for gallbladder surgery in [place]   He would like to be evaluated in [place] given his cardiac history.  I think if he has surgery he should have it here.  I have attached my last note.  I saw him in March and have spoken to him on the phone since  He is fairly active doing some biking.  Would you be will to see him for surgical evaluation? Nevin Bloodgood

## 2016-03-17 ENCOUNTER — Ambulatory Visit: Payer: Self-pay | Admitting: General Surgery

## 2016-03-17 NOTE — H&P (Signed)
Brandon Stone. Kuhl 03/17/2016 8:49 AM Location: [facility] Patient #: [id number] DOB: [birth year] Married / Language: English / Race: White Male  History of Present Illness Brandon Hollingshead MD; 03/17/2016 11:27 AM) The patient is a 70 year old male.   Note:He is referred by Dr. Harrington Challenger because of symptomatic cholelithiasis. His evaluation was done down in [place]. In early July of this year, he ate 2 large fatty breakfasts while at the beach. He then had some epigastric and right upper quadrant discomfort that persisted for about 2 days. He saw his local physician for evaluation. An ultrasound demonstrated multiple gallstones and a normal CBD diameter.. He had a CT scan because there was a lesion seen on the kidney and he was told it was a cyst based on CT findings. He saw a Psychologist, sport and exercise down in [place] but given his cardiac history, Dr. Harrington Challenger felt it would be better for him to have his surgery up in [place]. He's had some minor episodes since the original episode. Of note is that since the episode, he said multiple bouts of diarrhea daily. He was treated early in the year for signs infection with antibiotics. These episodes have continued.  He's had a previous umbilical hernia repair with mesh and recurrent repair with mesh in the past.  He has a significant history of coronary artery disease. He's had coronary artery bypass surgery. He's also had stents placed. He has stable angina. He is fairly active physically.  Other Problems Marjean Donna, CMA; 03/17/2016 8:49 AM) Arthritis Back Pain Cholelithiasis Diabetes Mellitus Diverticulosis Enlarged Prostate Gastroesophageal Reflux Disease Hemorrhoids High blood pressure Hypercholesterolemia Myocardial infarction Other disease, cancer, significant illness Vascular Disease  Past Surgical History Marjean Donna, CMA; 03/17/2016 8:49 AM) Appendectomy Coronary Artery Bypass Graft Tonsillectomy  Diagnostic  Studies History Marjean Donna, CMA; 03/17/2016 8:49 AM) Colonoscopy 1-5 years ago  Allergies Davy Pique Bynum, CMA; 03/17/2016 8:50 AM) Codeine Phosphate *ANALGESICS - OPIOID* Ticlopidine HCl *HEMATOLOGICAL AGENTS - MISC.*  Medication History Marjean Donna, CMA; 03/17/2016 8:51 AM) Allopurinol (300MG  Tablet, Oral) Active. AmLODIPine Besylate (5MG  Tablet, Oral) Active. Atorvastatin Calcium (80MG  Tablet, Oral) Active. MetFORMIN HCl (500MG  Tablet, Oral) Active. Metoprolol Succinate ER (25MG  Tablet ER 24HR, Oral) Active. Omeprazole (20MG  Capsule DR, Oral) Active. Potassium Chloride Crys ER (20MEQ Tablet ER, Oral) Active. Valsartan-Hydrochlorothiazide (160-25MG  Tablet, Oral) Active. Isosorbide Mononitrate ER (60MG  Tablet ER 24HR, Oral) Active. Clopidogrel Bisulfate (75MG  Tablet, Oral) Active. Medications Reconciled  Social History Marjean Donna, CMA; 03/17/2016 8:49 AM) Alcohol use Remotely quit alcohol use. Caffeine use Coffee. No drug use Tobacco use Former smoker.  Family History Marjean Donna, [place]; 03/17/2016 8:49 AM) Cancer Mother. Cerebrovascular Accident Brother, Sister. Diabetes Mellitus Father, Mother. Heart Disease Father, Mother. Heart disease in male family member before age 69 Hypertension Father. Migraine Headache Daughter.     Review of Systems Davy Pique Bynum CMA; 03/17/2016 8:49 AM) General Not Present- Appetite Loss, Chills, Fatigue, Fever, Night Sweats, Weight Gain and Weight Loss. Skin Not Present- Change in Wart/Mole, Dryness, Hives, Jaundice, New Lesions, Non-Healing Wounds, Rash and Ulcer. HEENT Present- Ringing in the Ears and Seasonal Allergies. Not Present- Earache, Hearing Loss, Hoarseness, Nose Bleed, Oral Ulcers, Sinus Pain, Sore Throat, Visual Disturbances, Wears glasses/contact lenses and Yellow Eyes. Respiratory Present- Snoring. Not Present- Bloody sputum, Chronic Cough, Difficulty Breathing and Wheezing. Breast Not Present- Breast Mass,  Breast Pain, Nipple Discharge and Skin Changes. Cardiovascular Not Present- Chest Pain, Difficulty Breathing Lying Down, Leg Cramps, Palpitations, Rapid Heart Rate, Shortness of Breath and Swelling of Extremities.  Gastrointestinal Present- Change in Bowel Habits, Chronic diarrhea, Excessive gas and Hemorrhoids. Not Present- Abdominal Pain, Bloating, Bloody Stool, Constipation, Difficulty Swallowing, Gets full quickly at meals, Indigestion, Nausea, Rectal Pain and Vomiting. Male Genitourinary Present- Impotence. Not Present- Blood in Urine, Change in Urinary Stream, Frequency, Nocturia, Painful Urination, Urgency and Urine Leakage. Musculoskeletal Not Present- Back Pain, Joint Pain, Joint Stiffness, Muscle Pain, Muscle Weakness and Swelling of Extremities. Neurological Not Present- Decreased Memory, Fainting, Headaches, Numbness, Seizures, Tingling, Tremor, Trouble walking and Weakness. Psychiatric Not Present- Anxiety, Bipolar, Change in Sleep Pattern, Depression, Fearful and Frequent crying. Endocrine Present- Hot flashes and New Diabetes. Not Present- Cold Intolerance, Excessive Hunger, Hair Changes and Heat Intolerance. Hematology Present- Blood Thinners and Excessive bleeding. Not Present- Easy Bruising, Gland problems, HIV and Persistent Infections.  Vitals (Sonya Bynum CMA; 03/17/2016 8:50 AM) 03/17/2016 8:49 AM Weight: 214 lb Height: 72in Body Surface Area: 2.19 m Body Mass Index: 29.02 kg/m  Temp.: 56F(Temporal)  Pulse: 82 (Regular)  BP: 128/80 (Sitting, Left Arm, Standard)      Physical Exam Brandon Hollingshead MD; 03/17/2016 11:27 AM)  The physical exam findings are as follows: Note:General: Overweight male in NAD. Pleasant and cooperative.  HEENT: [place]/AT, no external nasal or ear masses, mucous membranes are moist  EYES: EOMI, no scleral icterus, pupils normal  NECK: Supple, no obvious mass or thyroid mass/enlargement, no trachea deviation  CV: RRR, no murmur,  no edema  CHEST: Breath sounds equal and clear. Respirations nonlabored. Midsternal scar.  ABDOMEN: Soft, nontender, nondistended, no masses, no organomegaly, active bowel sounds, umbilical scar  SKIN: No jaundice.  NEUROLOGIC: Alert and oriented, answers questions appropriately, normal gait and station.  PSYCHIATRIC: Normal mood, affect , and behavior.    Assessment & Plan Brandon Hollingshead MD; 03/17/2016 11:37 AM)  SYMPTOMATIC CHOLELITHIASIS (K80.20) Impression: He is interested in proceeding with cholecystectomy.  Plan: We'll have his diarrhea worked up further by gastroenterology since this seems to me to be atypical. We'll ask Dr. Harrington Challenger about holding his aspirin and Plavix. Following this, we'll schedule laparoscopic cholecystectomy with overnight stay. I have explained the procedure, risks, and aftercare of cholecystectomy. Risks include but are not limited to bleeding, infection, wound problems, anesthesia, diarrhea, bile leak, injury to common bile duct/liver/intestine. He seems to understand and agrees with the plan.  Jackolyn Confer, M.D.

## 2016-04-01 ENCOUNTER — Other Ambulatory Visit (INDEPENDENT_AMBULATORY_CARE_PROVIDER_SITE_OTHER): Payer: Medicare HMO

## 2016-04-01 ENCOUNTER — Encounter: Payer: Self-pay | Admitting: Gastroenterology

## 2016-04-01 ENCOUNTER — Ambulatory Visit (INDEPENDENT_AMBULATORY_CARE_PROVIDER_SITE_OTHER): Payer: Medicare HMO | Admitting: Gastroenterology

## 2016-04-01 VITALS — BP 136/78 | HR 72 | Ht 70.5 in | Wt 219.0 lb

## 2016-04-01 DIAGNOSIS — K529 Noninfective gastroenteritis and colitis, unspecified: Secondary | ICD-10-CM | POA: Diagnosis not present

## 2016-04-01 DIAGNOSIS — K8020 Calculus of gallbladder without cholecystitis without obstruction: Secondary | ICD-10-CM

## 2016-04-01 DIAGNOSIS — Z7902 Long term (current) use of antithrombotics/antiplatelets: Secondary | ICD-10-CM | POA: Diagnosis not present

## 2016-04-01 LAB — CBC WITH DIFFERENTIAL/PLATELET
BASOS ABS: 0 10*3/uL (ref 0.0–0.1)
Basophils Relative: 0.2 % (ref 0.0–3.0)
EOS ABS: 0.1 10*3/uL (ref 0.0–0.7)
Eosinophils Relative: 0.9 % (ref 0.0–5.0)
HEMATOCRIT: 44.1 % (ref 39.0–52.0)
HEMOGLOBIN: 14.9 g/dL (ref 13.0–17.0)
LYMPHS PCT: 50 % — AB (ref 12.0–46.0)
Lymphs Abs: 6.9 10*3/uL — ABNORMAL HIGH (ref 0.7–4.0)
MCHC: 33.8 g/dL (ref 30.0–36.0)
MCV: 93.2 fl (ref 78.0–100.0)
Monocytes Absolute: 1.2 10*3/uL — ABNORMAL HIGH (ref 0.1–1.0)
Monocytes Relative: 8.5 % (ref 3.0–12.0)
Neutro Abs: 5.6 10*3/uL (ref 1.4–7.7)
Neutrophils Relative %: 40.4 % — ABNORMAL LOW (ref 43.0–77.0)
PLATELETS: 216 10*3/uL (ref 150.0–400.0)
RBC: 4.73 Mil/uL (ref 4.22–5.81)
RDW: 13.7 % (ref 11.5–15.5)
WBC: 13.8 10*3/uL — AB (ref 4.0–10.5)

## 2016-04-01 LAB — COMPREHENSIVE METABOLIC PANEL
ALBUMIN: 4.1 g/dL (ref 3.5–5.2)
ALT: 18 U/L (ref 0–53)
AST: 16 U/L (ref 0–37)
Alkaline Phosphatase: 61 U/L (ref 39–117)
BILIRUBIN TOTAL: 0.6 mg/dL (ref 0.2–1.2)
BUN: 16 mg/dL (ref 6–23)
CALCIUM: 10 mg/dL (ref 8.4–10.5)
CO2: 32 meq/L (ref 19–32)
CREATININE: 0.99 mg/dL (ref 0.40–1.50)
Chloride: 103 mEq/L (ref 96–112)
GFR: 79.94 mL/min (ref >60.00)
Glucose, Bld: 99 mg/dL (ref 70–99)
Potassium: 3.7 mEq/L (ref 3.5–5.1)
Sodium: 141 mEq/L (ref 135–145)
Total Protein: 6.7 g/dL (ref 6.0–8.3)

## 2016-04-01 LAB — IGA: IGA: 76 mg/dL (ref 68–378)

## 2016-04-01 LAB — HIGH SENSITIVITY CRP: CRP, High Sensitivity: 0.84 mg/L (ref 0.000–5.000)

## 2016-04-01 NOTE — Progress Notes (Signed)
HPI :  70 y/o male referred here for a new patient visit for chronic diarrhea from Dr. Zella Richer of general surgery. He has a PMH of CAD s/p stenting and CABG, also with history of gallstones pending cholecystectomy.  He has been seen by general surgery for abdominal pain. Intermittent epigastric and RUQ pain. Seen in the ER previously with US showing multiple gallstones and a normal CBD diameter. He had a CT scan for a lesion noted on the kidney, reported to be a cyst. He otherwise has had some RUQ pain periodically after he eats. He has a pending cholecystectomy with surgery for biliary colic symptoms.   He otherwise endorses chronic diarrhea, since July. He reported having constipation initially for 3 days when this started, but then since that time has had persistent diarrhea. He has 4-6 BMs prior to 9 AM every day, he reports in about 24 hours he has about 15-20 BMs per day. He reports all stools are loose. He has increased urgency to have a bowel movement. He wakes him the middle of the night at times to move his bowels, usually 3 nights per week. No blood in the stools that he is aware of. He has some abdominal cramping with his stools, left lower side of his abdomen. He has not lost any weight with this. He reports he has an intolerance to dairy, he reports this has been chronic. No other particular food intolerances otherwise. He denies any new changes in his medications other than having  metformin added about a year ago. He had a CT scan of his abdomen on 7/24 at which time he was having diarrhea - the exam reportedly showed normal small bowel and colon. He denies any recent antibiotic use.   He has had a colonoscopy 2 years ago as well as upper endoscopy done in [place]. He denied any diarrhea at the time of those exams. He does not think he had any abnormalities on colonoscopy. He had an EGD in this Spring for Barrett's esophagus. He is taking prilosec 20mg  BID for GERD and Barrett's. He  thinks this generally controls his reflux. No reports available from endoscopies.  He takes plavix for history of CAD s/p CABG. He had an MI in 1998, stent in place from that intervention.     Past Medical History:  Diagnosis Date  . Arthritis   . Barrett's esophagus   . BPH (benign prostatic hypertrophy)   . CAD (coronary artery disease)     a. CABG 1991;  b.  [place];  c.  S/P PTCA  stents to SVG ot OM last in 2003;  d.  Redo CABG x 2 in 2004 (VG->OM->LCX, LRA->PDA);  e. 12/2011 Cath: 3vd, sev LM into LCX dzs, VG->OM 100, LIMA->DIAG->LAD patent, Rad Art->PDA patent, EF 45%, Med Rx.  . Diverticulosis   . DM (diabetes mellitus) ([place])   . Dyslipidemia   . Gallstones   . Gout   . HTN (hypertension)   . Rheumatic fever    as a child     Past Surgical History:  Procedure Laterality Date  . APPENDECTOMY    . CORONARY ARTERY BYPASS GRAFT     1991 (LIMA to LAD/Diag;  SVG to RCA;  SVG to OM)  . CORONARY ARTERY BYPASS GRAFT     2004 (L radial to PDA; SVG to OM1/ distal LCx)  . TONSILLECTOMY AND ADENOIDECTOMY    . UMBILICAL HERNIA REPAIR     Family History  Problem Relation  Age of Onset  . Heart disease Mother   . Hypertension Mother   . Hyperlipidemia Mother   . Diabetes Mother   . Skin cancer Mother     skin  . Heart disease Father   . Hypertension Father   . Hyperlipidemia Father   . Diabetes Father    Social History  Substance Use Topics  . Smoking status: Former Smoker    Years: 8.00    Quit date: 06/24/1971  . Smokeless tobacco: Never Used  . Alcohol use No   Current Outpatient Prescriptions  Medication Sig Dispense Refill  . allopurinol (ZYLOPRIM) 300 MG tablet Take 300 mg by mouth daily.      Marland Kitchen amLODipine (NORVASC) 5 MG tablet Take 1 tablet (5 mg total) by mouth daily. 90 tablet 3  . aspirin 81 MG tablet Take 81 mg by mouth daily.      Marland Kitchen atorvastatin (LIPITOR) 80 MG tablet Take 1 tablet (80 mg total) by mouth daily. 90 tablet 3  . clopidogrel (PLAVIX) 75 MG  tablet Take 1 tablet (75 mg total) by mouth daily. 90 tablet 3  . isosorbide mononitrate (IMDUR) 60 MG 24 hr tablet Take 1 tablet (60 mg total) by mouth daily. 90 tablet 3  . metFORMIN (GLUCOPHAGE) 500 MG tablet Take 500 mg by mouth daily.     . metoprolol succinate (TOPROL-XL) 25 MG 24 hr tablet Take 1 tablet (25 mg total) by mouth 2 (two) times daily. 180 tablet 3  . Multiple Vitamin (MULTIVITAMIN) capsule Take 1 capsule by mouth daily.      . Omega-3 Fatty Acids (FISH OIL) 1200 MG CAPS Take 1 capsule by mouth daily.      Marland Kitchen omeprazole (PRILOSEC) 20 MG capsule Take 20 mg by mouth 2 (two) times daily.      . potassium chloride SA (K-DUR,KLOR-CON) 20 MEQ tablet Take 1 tablet (20 mEq total) by mouth daily. (Patient taking differently: Take 20 mEq by mouth 2 (two) times daily. ) 90 tablet 3  . triamcinolone cream (KENALOG) 0.1 % as needed.    . valsartan-hydrochlorothiazide (DIOVAN-HCT) 160-25 MG tablet Take 1 tablet by mouth daily. 90 tablet 3  . nitroGLYCERIN (NITROSTAT) 0.4 MG SL tablet PLACE 1 TABLET (0.4 MG TOTAL) UNDER THE TONGUE EVERY 5 (FIVE) MINUTES AS NEEDED FOR CHEST PAIN. (Patient not taking: Reported on 04/01/2016) 25 tablet 12   No current facility-administered medications for this visit.    Allergies  Allergen Reactions  . Other Hives    Intolerance to strong pain medications  . Codeine   . Ticlopidine Hcl      Review of Systems: All systems reviewed and negative except where noted in HPI.   Lab Results  Component Value Date   WBC 13.8 (H) 04/01/2016   HGB 14.9 04/01/2016   HCT 44.1 04/01/2016   MCV 93.2 04/01/2016   PLT 216.0 04/01/2016    Lab Results  Component Value Date   CREATININE 0.99 04/01/2016   BUN 16 04/01/2016   NA 141 04/01/2016   K 3.7 04/01/2016   CL 103 04/01/2016   CO2 32 04/01/2016    Lab Results  Component Value Date   ALT 18 04/01/2016   AST 16 04/01/2016   ALKPHOS 61 04/01/2016   BILITOT 0.6 04/01/2016     Physical Exam: BP  136/78 (BP Location: Left Arm, Patient Position: Sitting, Cuff Size: Normal)   Pulse 72   Ht 5' 10.5" (1.791 m) Comment: height measured without shoes  Wt  219 lb (99.3 kg)   BMI 30.98 kg/m  Constitutional: Pleasant,well-developed, male in no acute distress. HEENT: Normocephalic and atraumatic. Conjunctivae are normal. No scleral icterus. Neck supple.  Cardiovascular: Normal rate, regular rhythm.  Pulmonary/chest: Effort normal and breath sounds normal. No wheezing, rales or rhonchi. Abdominal: Soft, protuberant, nontender. There are no masses palpable. No hepatomegaly. Extremities: no edema Lymphadenopathy: No cervical adenopathy noted. Neurological: Alert and oriented to person place and time. Skin: Skin is warm and dry. No rashes noted. Psychiatric: Normal mood and affect. Behavior is normal.   ASSESSMENT AND PLAN: 70 year old male with medical history as outlined above, presenting for further evaluation of severe high frequency diarrhea as described above. He also has a history of biliary colic with gallstones noted on imaging, awaiting cholecystectomy, which is pending this evaluation. While we will assess for and rule out infectious etiologies, given the high frequency of stooling and nocturnal symptoms we will need to rule out IBD and microscopic colitis.  At this time recommend following: - hold metformin for the next few days to see if this helps at all - basic blood work to include CBC CMP CRP and celiac serologies - stool studies for C. difficile, ova and parasites, fecal calprotectin - once infectious workup is negative will start Imodium - I have tentatively scheduled him for a colonoscopy in 2 weeks pending stool studies and labs do not reveal an etiology in the interim - we will obtain reports of prior colonoscopy and upper endoscopy to clarify details of these exams - I discussed the risks and benefits of colonoscopy with the patient. Following this discussion he wished to  proceed if needed. He will need to hold his Plavix for 5 days prior to this exam, but he can continue baby aspirin throughout. We will obtain approval from his cardiologist to hold Plavix. Further recommendations pending this workup.   [place] Cellar, MD [facility] Pager [phone]  CC: Jackolyn Confer, MD

## 2016-04-01 NOTE — Patient Instructions (Signed)
If you are age 68 or older, your body mass index should be between 23-30. Your Body mass index is 30.98 kg/m. If this is out of the aforementioned range listed, please consider follow up with your Primary Care Provider.  If you are age 34 or younger, your body mass index should be between 19-25. Your Body mass index is 30.98 kg/m. If this is out of the aformentioned range listed, please consider follow up with your Primary Care Provider.   You have been scheduled for a colonoscopy. Please follow written instructions given to you at your visit today.  Please pick up your prep supplies at the pharmacy within the next 1-3 days. If you use inhalers (even only as needed), please bring them with you on the day of your procedure. Your physician has requested that you go to www.startemmi.com and enter the access code given to you at your visit today. This web site gives a general overview about your procedure. However, you should still follow specific instructions given to you by our office regarding your preparation for the procedure.  We have sent the following medications to your pharmacy for you to pick up at your convenience: [place] have been scheduled for a colonoscopy. Please follow written instructions given to you at your visit today.  Please pick up your prep supplies at the pharmacy within the next 1-3 days. If you use inhalers (even only as needed), please bring them with you on the day of your procedure. Your physician has requested that you go to www.startemmi.com and enter the access code given to you at your visit today. This web site gives a general overview about your procedure. However, you should still follow specific instructions given to you by our office regarding your preparation for the procedure.  Your physician has requested that you go to the basement for lab work before leaving today:

## 2016-04-02 ENCOUNTER — Encounter: Payer: Self-pay | Admitting: Gastroenterology

## 2016-04-02 ENCOUNTER — Telehealth: Payer: Self-pay | Admitting: Gastroenterology

## 2016-04-02 LAB — TISSUE TRANSGLUTAMINASE, IGA: TISSUE TRANSGLUTAMINASE AB, IGA: 1 U/mL (ref <4)

## 2016-04-03 ENCOUNTER — Encounter: Payer: Self-pay | Admitting: Gastroenterology

## 2016-04-03 MED ORDER — NA SULFATE-K SULFATE-MG SULF 17.5-3.13-1.6 GM/177ML PO SOLN: 1.0000 | Freq: Once | ORAL | 0 refills | Status: AC

## 2016-04-03 NOTE — Progress Notes (Signed)
Letter sent 10/12.jf 

## 2016-04-08 ENCOUNTER — Telehealth: Payer: Self-pay | Admitting: Gastroenterology

## 2016-04-08 NOTE — Telephone Encounter (Signed)
Routed call to [facility], [email or web address]

## 2016-04-09 ENCOUNTER — Other Ambulatory Visit: Payer: Self-pay

## 2016-04-09 ENCOUNTER — Other Ambulatory Visit: Payer: Medicare HMO

## 2016-04-09 NOTE — Telephone Encounter (Signed)
Spoke to patient, he has not dropped off stool sample yet. He thought that the test kit was coming in the mail to him. I explained that he needs to come to the lab to pick this up, which he said he will do today. I told him that we are waiting on these results in order to move forward with procedure.

## 2016-04-09 NOTE — Telephone Encounter (Signed)
Brandon Stone, Please call him and tell him that the test is not completed yet. We will call when it has resulted. Thank you!

## 2016-04-10 ENCOUNTER — Telehealth: Payer: Self-pay | Admitting: Gastroenterology

## 2016-04-10 NOTE — Telephone Encounter (Signed)
Instructed patient to bring in a stool sample, he thought that because the diarrhea cleared up yesterday, he didn't need to do that. Asked that he drop it off tomorrow, as these results needs to be completed before proceeding with colonoscopy.

## 2016-04-11 ENCOUNTER — Other Ambulatory Visit: Payer: Self-pay | Admitting: Gastroenterology

## 2016-04-11 ENCOUNTER — Other Ambulatory Visit: Payer: Medicare HMO

## 2016-04-11 DIAGNOSIS — K8020 Calculus of gallbladder without cholecystitis without obstruction: Secondary | ICD-10-CM

## 2016-04-11 DIAGNOSIS — K529 Noninfective gastroenteritis and colitis, unspecified: Secondary | ICD-10-CM

## 2016-04-12 LAB — C. DIFFICILE GDH AND TOXIN A/B
C. DIFF TOXIN A/B: NOT DETECTED
C. DIFFICILE GDH: DETECTED — AB

## 2016-04-12 LAB — CLOSTRIDIUM DIFFICILE BY PCR: CDIFFPCR: DETECTED — AB

## 2016-04-14 ENCOUNTER — Other Ambulatory Visit: Payer: Self-pay

## 2016-04-14 DIAGNOSIS — A0472 Enterocolitis due to Clostridium difficile, not specified as recurrent: Secondary | ICD-10-CM

## 2016-04-14 LAB — OVA AND PARASITE EXAMINATION: OP: NONE SEEN

## 2016-04-14 MED ORDER — METRONIDAZOLE 500 MG PO TABS: 500.0000 mg | ORAL_TABLET | Freq: Three times a day (TID) | ORAL | 0 refills | Status: AC

## 2016-04-17 ENCOUNTER — Telehealth: Payer: Self-pay | Admitting: Gastroenterology

## 2016-04-17 ENCOUNTER — Encounter: Payer: Medicare HMO | Admitting: Gastroenterology

## 2016-04-17 LAB — CALPROTECTIN: Calprotectin: 122.1 mcg/g (ref <=162.9)

## 2016-04-17 NOTE — Telephone Encounter (Signed)
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

## 2016-04-18 LAB — CALPROTECTIN: Calprotectin: 158.3 mcg/g (ref <=162.9)

## 2016-04-22 ENCOUNTER — Telehealth: Payer: Self-pay | Admitting: Gastroenterology

## 2016-04-22 ENCOUNTER — Other Ambulatory Visit: Payer: Self-pay

## 2016-04-22 NOTE — Telephone Encounter (Signed)
Spoke to patient, he does not have to repeat stool specimen. Instructed him to call after finishing the antibiotic course if not better and would reschedule colonoscopy at that time. Patient states that he is having less mucus now, but stool is still soft. Patient still has another week of medication to take.

## 2016-05-05 ENCOUNTER — Telehealth: Payer: Self-pay | Admitting: Gastroenterology

## 2016-05-05 NOTE — Telephone Encounter (Signed)
Spoke to patient after he finished taking his Flagyl for C-diff. He states that he is continuing to have very soft, frequent stools, with some diarrhea. Denies fever, pain or blood in the stool. As per your last note, I have scheduled him for the colonoscopy. Patient is on Plavix, managed by Dr. Harrington Challenger. Did not find any documentation about holding Plavix, do you still want to proceed with colonoscopy and if so contact Dr. Harrington Challenger for medication management of blood thinner? Please advise. Patient aware that you will be back in the office tomorrow.

## 2016-05-06 NOTE — Telephone Encounter (Signed)
Spoke to patient today, his last dose of Flagyl was 11/6, reports that the soft stools intermittent with diarrhea were still presents 3 days after stopping the Flagyl. He has noticed that it is slowly  improving, today he reports it is more formed than it has been. Patient on a side note, saw his PCP today for an infection in his toe. Was placed on a topical antibiotic ointment, no oral antibiotic.

## 2016-05-06 NOTE — Telephone Encounter (Signed)
Okay. Why don't we observe for a week or 2 and see how he does. He can take a probiotic such as florastor at this time. If symptoms recur or persist he can contact us in a few weeks. Thanks much

## 2016-05-06 NOTE — Telephone Encounter (Signed)
Can you clarify with him if he found any benefit from the Flagyl or not, or did he get better and symptoms recur? It is mostly just soft stools, or frank diarrhea? When did he finish is Flagyl? Thanks. I'm trying to determine if this still sounds like C diff we may give him vancomycin. We may consider colonoscopy to clarify what's driving his symptoms. In this setting would need to hold plavix for 5 days prior to the procedure.

## 2016-05-07 NOTE — Telephone Encounter (Signed)
Spoke to patient let him know to observe for two weeks, take a probiotic and let us know if symptoms persist or recur.

## 2016-05-23 ENCOUNTER — Telehealth: Payer: Self-pay | Admitting: Gastroenterology

## 2016-05-23 NOTE — Telephone Encounter (Signed)
Okay he can continue this for now and hopefully with him stools normalize. If diarrhea recurs like it did before he should contact us. Thanks

## 2016-05-23 NOTE — Telephone Encounter (Signed)
Sent to Dr. Havery [name].

## 2016-05-23 NOTE — Telephone Encounter (Signed)
Almyra Free can you please clarify a few things - I had recommended florastor probiotic, is that what he is taking? If so he can take 2 BID.   Otherwise has his diarrhea improved / resolved with flagyl or has it persisted? Want to make sure his C Diff was eradicated. Thank you for any clarification

## 2016-05-23 NOTE — Telephone Encounter (Signed)
Patient states that he is taking the florastor probiotic 2 gummies in the morning and 2 at dinner. He states that the diarrhea is better, not resolved. Only has a small amount first thing in the morning, then by the end of the day stools are more normal.

## 2016-05-23 NOTE — Telephone Encounter (Signed)
Patient advised of recommendations, understands to contact us if sxs recur.

## 2016-05-30 ENCOUNTER — Encounter: Payer: Medicare HMO | Admitting: Gastroenterology

## 2016-06-02 ENCOUNTER — Other Ambulatory Visit: Payer: Self-pay

## 2016-06-02 ENCOUNTER — Telehealth: Payer: Self-pay | Admitting: Gastroenterology

## 2016-06-02 ENCOUNTER — Encounter: Payer: Medicare HMO | Admitting: Gastroenterology

## 2016-06-02 DIAGNOSIS — Z8619 Personal history of other infectious and parasitic diseases: Secondary | ICD-10-CM

## 2016-06-02 DIAGNOSIS — R197 Diarrhea, unspecified: Secondary | ICD-10-CM

## 2016-06-02 NOTE — Telephone Encounter (Signed)
Lab order placed for C diff stool study. Patient advised to drop off specimen. He will pick up container today.

## 2016-06-02 NOTE — Telephone Encounter (Signed)
Patient called and his stools throughout the day are now loose, yellow in color, denies any foul odor. Patient also states that his urine is cloudy, like before when he tested positive for C-diff. Patient does not feel bad, denies fever. Please advise.

## 2016-06-02 NOTE — Telephone Encounter (Signed)
Can you please send a stool study for C Diff again, ensure negative. It is is negative and symptoms persist will consider endoscopic evaluation. Can you please order this and have him go to the lab? Thanks

## 2016-06-03 ENCOUNTER — Other Ambulatory Visit: Payer: Self-pay | Admitting: Gastroenterology

## 2016-06-03 ENCOUNTER — Other Ambulatory Visit: Payer: Medicare HMO

## 2016-06-03 DIAGNOSIS — R197 Diarrhea, unspecified: Secondary | ICD-10-CM

## 2016-06-03 DIAGNOSIS — Z8619 Personal history of other infectious and parasitic diseases: Secondary | ICD-10-CM

## 2016-06-04 LAB — C. DIFFICILE GDH AND TOXIN A/B
C. DIFF TOXIN A/B: NOT DETECTED
C. DIFFICILE GDH: DETECTED — AB

## 2016-06-04 LAB — CLOSTRIDIUM DIFFICILE BY PCR: Toxigenic C. Difficile by PCR: NOT DETECTED

## 2016-06-05 ENCOUNTER — Telehealth: Payer: Self-pay | Admitting: Internal Medicine

## 2016-06-05 ENCOUNTER — Telehealth: Payer: Self-pay | Admitting: Gastroenterology

## 2016-06-05 NOTE — Telephone Encounter (Signed)
Left message for Almyra Free to send EPIC message with specific procedure and request for how long MD wants pt to be off plavix.  Also advised she could fax information to Dr. Harrington Challenger.

## 2016-06-05 NOTE — Telephone Encounter (Signed)
New message  Brandon Stone is calling for recommendations for when to take pt off of Plavix for procedure at end of January  Please call back and advise

## 2016-06-05 NOTE — Telephone Encounter (Signed)
[facility] called and states that if you send a fax or message through epic with details about patient, they can send something back about how many days patient should be off plavix.

## 2016-06-06 NOTE — Telephone Encounter (Signed)
Doristine Counter, RN  Rodman Key, RN        Dr. Havery [name] wants to have patient hold the Plavix 5 days prior to colonoscopy. Patient is scheduled for the procedure on 07/21/16. Please ask Dr. Harrington Challenger for recommendations, thank you.  Almyra Free      Will route to Dr. Harrington Challenger for recommendations.

## 2016-06-08 NOTE — Telephone Encounter (Signed)
OK to hold plavix 5 days prior    

## 2016-06-09 ENCOUNTER — Ambulatory Visit: Payer: Medicare HMO | Admitting: *Deleted

## 2016-06-09 VITALS — Ht 71.0 in | Wt 223.2 lb

## 2016-06-09 DIAGNOSIS — R197 Diarrhea, unspecified: Secondary | ICD-10-CM

## 2016-06-09 MED ORDER — NA SULFATE-K SULFATE-MG SULF 17.5-3.13-1.6 GM/177ML PO SOLN: 1.0000 | Freq: Once | ORAL | 0 refills | Status: AC

## 2016-06-09 NOTE — Progress Notes (Signed)
Denies allergies to eggs or soy products. Denies complications with sedation or anesthesia. Denies O2 use. Denies use of diet or weight loss medications.  Emmi instructions given for colonoscopy.  

## 2016-06-09 NOTE — Telephone Encounter (Signed)
Forwarded via staff message to [place] at Dr. Doyne Keel office.

## 2016-06-25 ENCOUNTER — Telehealth: Payer: Self-pay

## 2016-06-25 NOTE — Telephone Encounter (Signed)
Spoke to patient to let him know that I've had to adjust the time by 30 minutes on 1/29, and he understands to be here at 2:00 instead of 2:30.

## 2016-07-09 ENCOUNTER — Encounter: Payer: Self-pay | Admitting: Gastroenterology

## 2016-07-14 ENCOUNTER — Telehealth: Payer: Self-pay | Admitting: Gastroenterology

## 2016-07-14 NOTE — Telephone Encounter (Signed)
He is scheduled at [facility] on 1/29 for colonoscopy. We do have copy of EGD done 08/2015 and path results. Please advise.

## 2016-07-15 NOTE — Telephone Encounter (Signed)
Based on the endo note alone, he had an irregular z-line, biopsied for Barrett's, but there is no pathology available in this document. If he had Barrett's without dysplasia he would not be due for another exam 3-5 years after this last one. However, will need path to make final determination on next surveillance date. Any way we can obtain that? Thanks

## 2016-07-15 NOTE — Telephone Encounter (Signed)
Okay I found it thanks. 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 He is on omeprazole.   Guidelines recommend repeat EGD 3 to 5 years after this exam, so he would not be due until 08/2018 at earliest. I do not think he needs an EGD at this time unless he is having difficulty swallowing. Thanks

## 2016-07-15 NOTE — Telephone Encounter (Signed)
Spoke to patient, let him know that the guidelines for repeat EGD is 3-5 years. Patient denies having any difficulty swallowing. Told him that I would put in a recall for EGD in 3 years, and should he develop any swallowing difficulties or other symptoms he should contact our office for re-evaluation.

## 2016-07-15 NOTE — Telephone Encounter (Signed)
Under the media tab, you will see the pathology associated with the EGD. If you still cannot find it let me know and I will print it for you.

## 2016-07-21 ENCOUNTER — Encounter: Payer: Self-pay | Admitting: Gastroenterology

## 2016-07-21 ENCOUNTER — Ambulatory Visit (AMBULATORY_SURGERY_CENTER): Payer: Medicare HMO | Admitting: Gastroenterology

## 2016-07-21 VITALS — BP 123/83 | HR 55 | Temp 98.7°F | Resp 20 | Ht 71.0 in | Wt 223.0 lb

## 2016-07-21 DIAGNOSIS — D122 Benign neoplasm of ascending colon: Secondary | ICD-10-CM

## 2016-07-21 DIAGNOSIS — D123 Benign neoplasm of transverse colon: Secondary | ICD-10-CM

## 2016-07-21 DIAGNOSIS — R197 Diarrhea, unspecified: Secondary | ICD-10-CM | POA: Diagnosis present

## 2016-07-21 HISTORY — PX: COLONOSCOPY WITH PROPOFOL: SHX5780

## 2016-07-21 LAB — GLUCOSE, CAPILLARY
GLUCOSE-CAPILLARY: 91 mg/dL (ref 65–99)
Glucose-Capillary: 104 mg/dL — ABNORMAL HIGH (ref 65–99)

## 2016-07-21 MED ORDER — SODIUM CHLORIDE 0.9 % IV SOLN
500.0000 mL | INTRAVENOUS | Status: DC
Start: 2016-07-21 — End: 2016-09-18

## 2016-07-21 NOTE — Progress Notes (Signed)
Called to room to assist during endoscopic procedure.  Patient ID and intended procedure confirmed with present staff. Received instructions for my participation in the procedure from the performing physician.  

## 2016-07-21 NOTE — Op Note (Signed)
[facility] Patient Name: Brandon Stone Procedure Date: 07/21/2016 2:29 PM MRN: [id number] Endoscopist: Remo Lipps P. Armbruster MD, MD Age: 71 Referring MD:  Date of Birth: 1946/06/02 Gender: Male Account #: [id number] Procedure:                Colonoscopy Indications:              Chronic diarrhea, history of C diff s/p treatment Medicines:                Monitored Anesthesia Care Procedure:                Pre-Anesthesia Assessment:                           - Prior to the procedure, a History and Physical                            was performed, and patient medications and                            allergies were reviewed. The patient's tolerance of                            previous anesthesia was also reviewed. The risks                            and benefits of the procedure and the sedation                            options and risks were discussed with the patient.                            All questions were answered, and informed consent                            was obtained. Prior Anticoagulants: The patient has                            taken Plavix (clopidogrel), last dose was 5 days                            prior to procedure. ASA Grade Assessment: II - A                            patient with mild systemic disease. After reviewing                            the risks and benefits, the patient was deemed in                            satisfactory condition to undergo the procedure.                           After obtaining informed consent, the colonoscope  was passed under direct vision. Throughout the                            procedure, the patient's blood pressure, pulse, and                            oxygen saturations were monitored continuously. The                            CF-HQ190 was introduced through the anus and                            advanced to the the terminal ileum, with   identification of the appendiceal orifice and IC                            valve. The colonoscopy was performed without                            difficulty. The patient tolerated the procedure                            well. The quality of the bowel preparation was                            good. The terminal ileum, ileocecal valve,                            appendiceal orifice, and rectum were photographed. Scope In: 3:00:06 PM Scope Out: 3:17:00 PM Scope Withdrawal Time: 0 hours 14 minutes 46 seconds  Total Procedure Duration: 0 hours 16 minutes 54 seconds  Findings:                 The perianal and digital rectal examinations were                            normal.                           A 5 mm polyp was found in the ascending colon. The                            polyp was sessile. The polyp was removed with a                            cold snare. Resection and retrieval were complete.                           A 3 mm polyp was found in the hepatic flexure. The                            polyp was sessile. The polyp was removed with a  cold biopsy forceps. Resection and retrieval were                            complete.                           A 3 mm polyp was found in the splenic flexure. The                            polyp was sessile. The polyp was removed with a                            cold biopsy forceps. Resection and retrieval were                            complete.                           The terminal ileum appeared normal.                           Multiple medium-mouthed diverticula were found in                            the left colon.                           The exam was otherwise without abnormality.                           Biopsies for histology were taken with a cold                            forceps from the right colon and left colon for                            evaluation of microscopic colitis. Complications:             No immediate complications. Estimated blood loss:                            Minimal. Estimated Blood Loss:     Estimated blood loss was minimal. Impression:               - One 5 mm polyp in the ascending colon, removed                            with a cold snare. Resected and retrieved.                           - One 3 mm polyp at the hepatic flexure, removed                            with a cold biopsy forceps. Resected and retrieved.                           -  One 3 mm polyp at the splenic flexure, removed                            with a cold biopsy forceps. Resected and retrieved.                           - The examined portion of the ileum was normal.                           - Diverticulosis in the left colon.                           - The examination was otherwise normal.                           - Biopsies were taken with a cold forceps from the                            right colon and left colon for evaluation of                            microscopic colitis. Recommendation:           - Patient has a contact number available for                            emergencies. The signs and symptoms of potential                            delayed complications were discussed with the                            patient. Return to normal activities tomorrow.                            Written discharge instructions were provided to the                            patient.                           - Resume previous diet.                           - Continue present medications including aspirin.                           - Resume Plavix in 3 days                           - No ibuprofen, naproxen, or other non-steroidal                            anti-inflammatory drugs for 2 weeks after polyp  removal.                           - Trial of immodium as needed for diarrhea                           - Await pathology results.                            - Repeat colonoscopy is recommended for                            surveillance. The colonoscopy date will be                            determined after pathology results from today's                            exam become available for review. Remo Lipps P. Armbruster MD, MD 07/21/2016 3:23:00 PM This report has been signed electronically.

## 2016-07-21 NOTE — Progress Notes (Signed)
Report to PACU, RN, vss, BBS= Clear.  

## 2016-07-21 NOTE — Patient Instructions (Signed)
Impression/Recommendations:  Polyp handout given to patient. Diverticulosis handout given to patient.  Resume Plavix in 3 days.  No ibuprofen, naproxen, or other NSAIDs for 2 weeks after polyp removal.   Tylenol only until Feb. 13, 2018.  Trial of Immodium as needed for diarrhea.  Repeat colonoscopy recommended for surveillance.   Colonoscopy date to be determined based on pathology results.  YOU HAD AN ENDOSCOPIC PROCEDURE TODAY AT [facility]:   Refer to the procedure report that was given to you for any specific questions about what was found during the examination.  If the procedure report does not answer your questions, please call your gastroenterologist to clarify.  If you requested that your care partner not be given the details of your procedure findings, then the procedure report has been included in a sealed envelope for you to review at your convenience later.  YOU SHOULD EXPECT: Some feelings of bloating in the abdomen. Passage of more gas than usual.  Walking can help get rid of the air that was put into your GI tract during the procedure and reduce the bloating. If you had a lower endoscopy (such as a colonoscopy or flexible sigmoidoscopy) you may notice spotting of blood in your stool or on the toilet paper. If you underwent a bowel prep for your procedure, you may not have a normal bowel movement for a few days.  Please Note:  You might notice some irritation and congestion in your nose or some drainage.  This is from the oxygen used during your procedure.  There is no need for concern and it should clear up in a day or so.  SYMPTOMS TO REPORT IMMEDIATELY:   Following lower endoscopy (colonoscopy or flexible sigmoidoscopy):  Excessive amounts of blood in the stool  Significant tenderness or worsening of abdominal pains  Swelling of the abdomen that is new, acute  Fever of 100F or higher For urgent or emergent issues, a gastroenterologist can be reached at  any hour by calling [phone].   DIET:  We do recommend a small meal at first, but then you may proceed to your regular diet.  Drink plenty of fluids but you should avoid alcoholic beverages for 24 hours.  ACTIVITY:  You should plan to take it easy for the rest of today and you should NOT DRIVE or use heavy machinery until tomorrow (because of the sedation medicines used during the test).    FOLLOW UP: Our staff will call the number listed on your records the next business day following your procedure to check on you and address any questions or concerns that you may have regarding the information given to you following your procedure. If we do not reach you, we will leave a message.  However, if you are feeling well and you are not experiencing any problems, there is no need to return our call.  We will assume that you have returned to your regular daily activities without incident.  If any biopsies were taken you will be contacted by phone or by letter within the next 1-3 weeks.  Please call us at [phone] if you have not heard about the biopsies in 3 weeks.    SIGNATURES/CONFIDENTIALITY: You and/or your care partner have signed paperwork which will be entered into your electronic medical record.  These signatures attest to the fact that that the information above on your After Visit Summary has been reviewed and is understood.  Full responsibility of the confidentiality of this discharge  information lies with you and/or your care-partner. 

## 2016-07-22 ENCOUNTER — Telehealth: Payer: Self-pay

## 2016-07-22 NOTE — Telephone Encounter (Signed)
  Follow up Call-  Call back number 07/21/2016  Post procedure Call Back phone  # [phone]  Permission to leave phone message Yes  Some recent data might be hidden     Patient questions:  Do you have a fever, pain , or abdominal swelling? No. Pain Score  0 *  Have you tolerated food without any problems? Yes.    Have you been able to return to your normal activities? Yes.    Do you have any questions about your discharge instructions: Diet   No. Medications  No. Follow up visit  No.  Do you have questions or concerns about your Care? No.  Actions: * If pain score is 4 or above: No action needed, pain <4.

## 2016-07-25 ENCOUNTER — Encounter: Payer: Self-pay | Admitting: Gastroenterology

## 2016-08-11 ENCOUNTER — Ambulatory Visit: Payer: Self-pay | Admitting: General Surgery

## 2016-08-11 NOTE — H&P (Signed)
Brandon Stone 08/11/2016 11:16 AM Location: [facility] Patient #: [id number] DOB: 06-Jun-1946 Married / Language: Brandon Stone / Race: White Male  History of Present Illness [name] MD; 08/11/2016 11:35 AM) The patient is a 71 year old male.   Note:He presents for a preoperative visit. I saw him in September symptomatic cholelithiasis. He is only had a couple episodes since then. He had his colonoscopy which demonstrated benign polyps. He is been using Imodium which has helped his diarrhea. Dr. Harrington Challenger feels it's okay to hold his Plavix for 5 days before surgery.  Allergies [name], [place]; 08/11/2016 11:17 AM) Codeine Phosphate *ANALGESICS - OPIOID* Ticlopidine HCl *HEMATOLOGICAL AGENTS - MISC.*  Medication History [name], CMA; 08/11/2016 11:17 AM) GlipiZIDE ER (2.5MG  Tablet ER 24HR, Oral daily) Active. Allopurinol (300MG  Tablet, Oral) Active. AmLODIPine Besylate (5MG  Tablet, Oral) Active. Atorvastatin Calcium (80MG  Tablet, Oral) Active. MetFORMIN HCl (500MG  Tablet, Oral) Active. Metoprolol Succinate ER (25MG  Tablet ER 24HR, Oral) Active. Omeprazole (20MG  Capsule DR, Oral) Active. Potassium Chloride Crys ER (20MEQ Tablet ER, Oral) Active. Valsartan-Hydrochlorothiazide (160-25MG  Tablet, Oral) Active. Isosorbide Mononitrate ER (60MG  Tablet ER 24HR, Oral) Active. Clopidogrel Bisulfate (75MG  Tablet, Oral) Active. Medications Reconciled    Vitals ([name] CMA; 08/11/2016 11:17 AM) 08/11/2016 11:17 AM Weight: 223.2 lb Height: 72in Body Surface Area: 2.23 m Body Mass Index: 30.27 kg/m  Temp.: 97.44F  Pulse: 78 (Regular)  BP: 122/78 (Sitting, Left Arm, Standard)      Physical Exam [name] MD; 08/11/2016 11:37 AM)  The physical exam findings are as follows: Note:GENERAL APPEARANCE: Overweight male in NAD. Pleasant and cooperative.  EARS, NOSE, MOUTH THROAT: [place]/AT external ears: no lesions or  deformities external nose: no lesions or deformities hearing: grossly normal lips: moist, no deformities EYES external: conjunctiva, lids, sclerae normal pupils: equal, round glasses: no  CV ascultation: RRR, extremity edema: no CHEST: midsternal scar  RESP auscultation: breath sounds equal and clear respiratory effort: normal  GASTROINTESTINAL abdomen: Soft, non-tender, non-distended, no masses liver and spleen: not enlarged. hernia: none present scar: subumbilical   MUSCULOSKELETAL station and gait: normal instability: none  LYMPHATIC: No palpable cervical, supraclavicular, axillary adenopathy.  SKIN jaundice: none  NEUROLOGIC speech: normal  PSYCHIATRIC alertness and orientation: normal mood/affect/behavior: normal judgement and insight: normal    Assessment & Plan [name] MD; 08/11/2016 11:40 AM)  SYMPTOMATIC CHOLELITHIASIS (K80.20) Impression: Preoperative evaluation is complete.  Plan: Laparoscopic cholecystectomy with cholangiogram. Dr. Harrington Challenger would like him to stay overnight for observation given his cardiac history. I have explained the procedure, risks, and aftercare of cholecystectomy. Risks include but are not limited to bleeding, infection, wound problems, anesthesia, diarrhea, bile leak, injury to common bile duct/liver/intestine. He seems to understand and agrees with the plan. I asked him to stop his Plavix 5 days prior to surgery.  Jackolyn Confer, M.D.

## 2016-09-10 NOTE — Pre-Procedure Instructions (Signed)
Brandon Stone  09/10/2016      [id number] - [place], [place] - [address] [address] Phone: [phone] Fax: [phone]  [facility] - [place], [place] [address] Phone: [phone] Fax: [phone]    Your procedure is scheduled on   THURSDAY  09/18/16  Report to [facility] at 530 A.M.  Call this number if you have problems the morning of surgery:  [phone]   Remember:  Do not eat food or drink liquids after midnight.  Take these medicines the morning of surgery with A SIP OF WATER   ALLOPURINOL, AMLODIPINE (NORVASC), ISOSORBIDE MONONITRATE (IMDUR), METOPROLOL (TOPROL XL), OMEPRAZOLE (PRILOSEC) (STOP PLAVIX 5 DAYS PRIOR TO SURGERY.      DO NOT TAKE ANY IBUPROFEN/ ADVIL/MOTRIN, GOODY POWDERS, BC'S, HERBAL MEDICINES, FISH OIL )   How to Manage Your Diabetes Before and After Surgery  Why is it important to control my blood sugar before and after surgery? . Improving blood sugar levels before and after surgery helps healing and can limit problems. . A way of improving blood sugar control is eating a healthy diet by: o  Eating less sugar and carbohydrates o  Increasing activity/exercise o  Talking with your doctor about reaching your blood sugar goals . High blood sugars (greater than 180 mg/dL) can raise your risk of infections and slow your recovery, so you will need to focus on controlling your diabetes during the weeks before surgery. . Make sure that the doctor who takes care of your diabetes knows about your planned surgery including the date and location.  How do I manage my blood sugar before surgery? . Check your blood sugar at least 4 times a day, starting 2 days before surgery, to make sure that the level is not too high or low. o Check your blood sugar the morning of your surgery when you wake up and every 2 hours until you get to  the Short Stay unit. . If your blood sugar is less than 70 mg/dL, you will need to treat for low blood sugar: o Do not take insulin. o Treat a low blood sugar (less than 70 mg/dL) with  cup of clear juice (cranberry or apple), 4 glucose tablets, OR glucose gel. o Recheck blood sugar in 15 minutes after treatment (to make sure it is greater than 70 mg/dL). If your blood sugar is not greater than 70 mg/dL on recheck, call [phone] for further instructions. . Report your blood sugar to the short stay nurse when you get to Short Stay.  . If you are admitted to the hospital after surgery: o Your blood sugar will be checked by the staff and you will probably be given insulin after surgery (instead of oral diabetes medicines) to make sure you have good blood sugar levels. o The goal for blood sugar control after surgery is 80-180 mg/dL.              WHAT DO I DO ABOUT MY DIABETES MEDICATION?   Marland Kitchen Do not take oral diabetes medicines (pills) the morning of surgerY       .   Other Instructions:          Patient Signature:  Date:   Nurse Signature:  Date:   Reviewed and Endorsed by [facility] Patient Education Committee, August 2015  Do not wear jewelry, make-up or nail polish.  Do not wear lotions, powders, or perfumes, or deoderant.  Do not shave 48 hours prior to surgery.  Men may shave face and neck.  Do not bring valuables to the hospital.  [facility] is not responsible for any belongings or valuables.  Contacts, dentures or bridgework may not be worn into surgery.  Leave your suitcase in the car.  After surgery it may be brought to your room.  For patients admitted to the hospital, discharge time will be determined by your treatment team.  Patients discharged the day of surgery will not be allowed to drive home.   Name and phone number of your driver:    Special instructions:  [facility] - Preparing for Surgery  Before surgery, you can play an important  role.  Because skin is not sterile, your skin needs to be as free of germs as possible.  You can reduce the number of germs on you skin by washing with CHG (chlorahexidine gluconate) soap before surgery.  CHG is an antiseptic cleaner which kills germs and bonds with the skin to continue killing germs even after washing.  Please DO NOT use if you have an allergy to CHG or antibacterial soaps.  If your skin becomes reddened/irritated stop using the CHG and inform your nurse when you arrive at Short Stay.  Do not shave (including legs and underarms) for at least 48 hours prior to the first CHG shower.  You may shave your face.  Please follow these instructions carefully:   1.  Shower with CHG Soap the night before surgery and the                                morning of Surgery.  2.  If you choose to wash your hair, wash your hair first as usual with your       normal shampoo.  3.  After you shampoo, rinse your hair and body thoroughly to remove the                      Shampoo.  4.  Use CHG as you would any other liquid soap.  You can apply chg directly       to the skin and wash gently with scrungie or a clean washcloth.  5.  Apply the CHG Soap to your body ONLY FROM THE NECK DOWN.        Do not use on open wounds or open sores.  Avoid contact with your eyes,       ears, mouth and genitals (private parts).  Wash genitals (private parts)       with your normal soap.  6.  Wash thoroughly, paying special attention to the area where your surgery        will be performed.  7.  Thoroughly rinse your body with warm water from the neck down.  8.  DO NOT shower/wash with your normal soap after using and rinsing off       the CHG Soap.  9.  Pat yourself dry with a clean towel.            10.  Wear clean pajamas.            11.  Place clean sheets on your bed the night of your first shower and do not        sleep with pets.  Day of Surgery  Do  not apply any lotions/deoderants the morning of surgery.   Please wear clean clothes to the hospital/surgery center.    Please read over the following fact sheets that you were given. Pain Booklet

## 2016-09-11 ENCOUNTER — Encounter (HOSPITAL_COMMUNITY)
Admission: RE | Admit: 2016-09-11 | Discharge: 2016-09-11 | Disposition: A | Discharge status: Home / Self Care | Payer: Medicare HMO | Source: Ambulatory Visit | Attending: General Surgery | Admitting: General Surgery

## 2016-09-11 ENCOUNTER — Encounter (HOSPITAL_COMMUNITY): Payer: Self-pay

## 2016-09-11 DIAGNOSIS — K8080 Other cholelithiasis without obstruction: Secondary | ICD-10-CM | POA: Diagnosis not present

## 2016-09-11 DIAGNOSIS — R9431 Abnormal electrocardiogram [ECG] [EKG]: Secondary | ICD-10-CM | POA: Insufficient documentation

## 2016-09-11 DIAGNOSIS — Z01812 Encounter for preprocedural laboratory examination: Secondary | ICD-10-CM | POA: Diagnosis present

## 2016-09-11 DIAGNOSIS — Z01810 Encounter for preprocedural cardiovascular examination: Secondary | ICD-10-CM | POA: Diagnosis not present

## 2016-09-11 HISTORY — DX: Angina pectoris, unspecified: I20.9

## 2016-09-11 HISTORY — DX: Gastro-esophageal reflux disease without esophagitis: K21.9

## 2016-09-11 HISTORY — DX: Transient cerebral ischemic attack, unspecified: G45.9

## 2016-09-11 HISTORY — DX: Psoriasis, unspecified: L40.9

## 2016-09-11 HISTORY — DX: Acute myocardial infarction, unspecified: I21.9

## 2016-09-11 HISTORY — DX: Leukemia, unspecified, in remission: C95.91

## 2016-09-11 HISTORY — DX: Dyspnea, unspecified: R06.00

## 2016-09-11 HISTORY — DX: Personal history of other diseases of the digestive system: Z87.19

## 2016-09-11 LAB — COMPREHENSIVE METABOLIC PANEL
ALBUMIN: 3.7 g/dL (ref 3.5–5.0)
ALK PHOS: 62 U/L (ref 38–126)
ALT: 25 U/L (ref 17–63)
ANION GAP: 9 (ref 5–15)
AST: 26 U/L (ref 15–41)
BUN: 14 mg/dL (ref 6–20)
CALCIUM: 9.5 mg/dL (ref 8.9–10.3)
CHLORIDE: 104 mmol/L (ref 101–111)
CO2: 26 mmol/L (ref 22–32)
Creatinine, Ser: 0.97 mg/dL (ref 0.61–1.24)
GFR calc Af Amer: >60 mL/min (ref >60)
GFR calc non Af Amer: >60 mL/min (ref >60)
GLUCOSE: 116 mg/dL — AB (ref 65–99)
Potassium: 3.4 mmol/L — ABNORMAL LOW (ref 3.5–5.1)
SODIUM: 139 mmol/L (ref 135–145)
Total Bilirubin: 0.9 mg/dL (ref 0.3–1.2)
Total Protein: 6.5 g/dL (ref 6.5–8.1)

## 2016-09-11 LAB — CBC WITH DIFFERENTIAL/PLATELET
BASOS PCT: 0 %
Basophils Absolute: 0 10*3/uL (ref 0.0–0.1)
EOS ABS: 0.1 10*3/uL (ref 0.0–0.7)
Eosinophils Relative: 1 %
HCT: 43.2 % (ref 39.0–52.0)
HEMOGLOBIN: 14.9 g/dL (ref 13.0–17.0)
Lymphocytes Relative: 59 %
Lymphs Abs: 5.9 10*3/uL — ABNORMAL HIGH (ref 0.7–4.0)
MCH: 31.8 pg (ref 26.0–34.0)
MCHC: 34.5 g/dL (ref 30.0–36.0)
MCV: 92.3 fL (ref 78.0–100.0)
MONOS PCT: 6 %
Monocytes Absolute: 0.5 10*3/uL (ref 0.1–1.0)
NEUTROS PCT: 34 %
Neutro Abs: 3.3 10*3/uL (ref 1.7–7.7)
PLATELETS: 200 10*3/uL (ref 150–400)
RBC: 4.68 MIL/uL (ref 4.22–5.81)
RDW: 13 % (ref 11.5–15.5)
WBC: 9.9 10*3/uL (ref 4.0–10.5)

## 2016-09-11 LAB — HEMOGLOBIN A1C
Hgb A1c MFr Bld: 7 % — ABNORMAL HIGH (ref 4.8–5.6)
MEAN PLASMA GLUCOSE: 154 mg/dL

## 2016-09-11 LAB — GLUCOSE, CAPILLARY: Glucose-Capillary: 196 mg/dL — ABNORMAL HIGH (ref 65–99)

## 2016-09-15 ENCOUNTER — Other Ambulatory Visit: Payer: Self-pay | Admitting: Internal Medicine

## 2016-09-15 NOTE — Progress Notes (Signed)
Anesthesia chart review: Patient is a 71 year old male scheduled for cholecystectomy on 09/18/2016 by Dr. Zella Richer.  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 BMI is consistent with obesity.   - PCP is Dr. Janie Morning, last visit 08/15/16. ([place]; see Care Everywhere.) She was aware of surgery plans. Patient was back to riding his bike.  - Cardiologist is Dr. Dorris Carnes, last visit seen 08/27/15. CAD felt stable at that time. Continued walking recommended. Dr. Oletta Lamas has contacted her about surgery plans. According to H&P, "Dr. Harrington Challenger feels it's okay to hold his Plavix for 5 days before surgery." - GI is Dr. Dundee Cellar. - HEM-ONC is Dr. Lurline Del.  Meds include allopurinol, amlodipine, aspirin 81 mg, Lipitor, Plavix (hold 5 days prior to surgery), glipizide, Imdur, Claritin, Toprol-XL, nitroglycerin, fish oil, Prilosec, KCl, valsartan-HCTZ.  BP 136/77   Pulse 66   Temp 36.5 C   Resp 20   Ht 5\' 11"  (1.803 m)   Wt 225 lb 1.6 oz (102.1 kg)   SpO2 99%   BMI 31.40 kg/m   EKG 09/11/16: NSR, inferior infarct (age undetermined). No significant change since last tracing.   Nuclear stress test 09/25/14: Overall Impression:   Intermediate risk stress nuclear study.  There is a defect of moderate severity involving the inferoseptal, inferior and inferolateral wall consistent  with old scar. There is minimal reversibility. SDS = 3.  Patient experienced mild 2/10 chest pain radiated to back and left shoulder. Nonspecific changes at rest become more prominent with exercise. There is mild LV systolic dysfunction with mild inferior wall hypokinesis. LV Ejection Fraction: 46%.  LV Wall Motion:  Mild inferior wall hypokinesis. (Findings reviewed by Dr. Harrington Challenger who wrote, "Stress test shows no evid for ischemia. There is areas of scar where pt has known blockages. Not reversible. LV function is stable, mildly down. After review of heart catheterization in the past I would recomm keep on same meds.")  Cardiac cath 01/08/12: Final Conclusions:   1.  Patent IMA to the diagonal and LAD with diffuse distal LAD disease. 2.  Patent radial to the PDA 3.  Occluded SVG to the OM 4.  Severe sequential disease of the LMCA leading into a diffusely disease native CFX. 5.  Mildly reduced LV overall.   Recommendations:  1.  The distal CFX does not appear very favorable for another redo, and is a poor vessel for intervention.  We can have Dr. Servando Snare review the films, but I am skeptical that another procedure will be of help.  Enhancement of medical therapy is probably the best option at this point.   Echo 03/27/06: SUMMARY - There is mild/moderate hypokinesis of the inferior and posterior    walls at the base. The other walls move well. Left    ventricular ejection fraction was estimated , range being 55    % to 60 %. - There was mild aortic valvular regurgitation. - There was mild aortic root dilatation (41 mm). - The left atrium was dilated.  Preoperative labs noted. A1c 7.0.   Patient with known CAD. Last cath in 2013 did not show very favorable anatomy for intervention. Dr. Harrington Challenger reviewed 2016 stress test and felt it showed areas of scar at areas of known blockages, but no significant ischemia. Continued medical therapy recommended. EKG appears stable. By notes, Dr.  Zella Richer has already spoken with Dr. Harrington Challenger about surgery plans. Anesthesiologist to evaluate on the day of surgery to ensure no acute changes or progressive CV symptoms prior to proceeding.  George Hugh [facility]/Anesthesiology Phone [phone] 09/15/2016 10:33 AM

## 2016-09-18 ENCOUNTER — Ambulatory Visit (HOSPITAL_COMMUNITY): Payer: Medicare HMO

## 2016-09-18 ENCOUNTER — Ambulatory Visit (HOSPITAL_COMMUNITY): Payer: Medicare HMO | Admitting: Vascular Surgery

## 2016-09-18 ENCOUNTER — Ambulatory Visit (HOSPITAL_COMMUNITY): Payer: Medicare HMO | Admitting: Certified Registered Nurse Anesthetist

## 2016-09-18 ENCOUNTER — Encounter (HOSPITAL_COMMUNITY): Payer: Self-pay | Admitting: *Deleted

## 2016-09-18 ENCOUNTER — Encounter (HOSPITAL_COMMUNITY)
Admission: RE | Disposition: A | Discharge status: Home / Self Care | Payer: Self-pay | Source: Ambulatory Visit | Attending: General Surgery

## 2016-09-18 ENCOUNTER — Ambulatory Visit (HOSPITAL_COMMUNITY)
Admission: RE | Admit: 2016-09-18 | Discharge: 2016-09-18 | Disposition: A | Discharge status: Home / Self Care | Payer: Medicare HMO | Source: Ambulatory Visit | Attending: General Surgery | Admitting: General Surgery

## 2016-09-18 DIAGNOSIS — Z87891 Personal history of nicotine dependence: Secondary | ICD-10-CM | POA: Insufficient documentation

## 2016-09-18 DIAGNOSIS — Z888 Allergy status to other drugs, medicaments and biological substances status: Secondary | ICD-10-CM | POA: Insufficient documentation

## 2016-09-18 DIAGNOSIS — M109 Gout, unspecified: Secondary | ICD-10-CM | POA: Insufficient documentation

## 2016-09-18 DIAGNOSIS — L409 Psoriasis, unspecified: Secondary | ICD-10-CM | POA: Insufficient documentation

## 2016-09-18 DIAGNOSIS — I2510 Atherosclerotic heart disease of native coronary artery without angina pectoris: Secondary | ICD-10-CM | POA: Insufficient documentation

## 2016-09-18 DIAGNOSIS — Z7982 Long term (current) use of aspirin: Secondary | ICD-10-CM | POA: Insufficient documentation

## 2016-09-18 DIAGNOSIS — K8020 Calculus of gallbladder without cholecystitis without obstruction: Secondary | ICD-10-CM | POA: Diagnosis present

## 2016-09-18 DIAGNOSIS — N400 Enlarged prostate without lower urinary tract symptoms: Secondary | ICD-10-CM | POA: Diagnosis not present

## 2016-09-18 DIAGNOSIS — M1990 Unspecified osteoarthritis, unspecified site: Secondary | ICD-10-CM | POA: Diagnosis not present

## 2016-09-18 DIAGNOSIS — R0600 Dyspnea, unspecified: Secondary | ICD-10-CM | POA: Insufficient documentation

## 2016-09-18 DIAGNOSIS — K219 Gastro-esophageal reflux disease without esophagitis: Secondary | ICD-10-CM | POA: Insufficient documentation

## 2016-09-18 DIAGNOSIS — Z885 Allergy status to narcotic agent status: Secondary | ICD-10-CM | POA: Insufficient documentation

## 2016-09-18 DIAGNOSIS — K5790 Diverticulosis of intestine, part unspecified, without perforation or abscess without bleeding: Secondary | ICD-10-CM | POA: Insufficient documentation

## 2016-09-18 DIAGNOSIS — Z9102 Food additives allergy status: Secondary | ICD-10-CM | POA: Insufficient documentation

## 2016-09-18 DIAGNOSIS — Z951 Presence of aortocoronary bypass graft: Secondary | ICD-10-CM | POA: Insufficient documentation

## 2016-09-18 DIAGNOSIS — I10 Essential (primary) hypertension: Secondary | ICD-10-CM | POA: Insufficient documentation

## 2016-09-18 DIAGNOSIS — Z7984 Long term (current) use of oral hypoglycemic drugs: Secondary | ICD-10-CM | POA: Insufficient documentation

## 2016-09-18 DIAGNOSIS — C9591 Leukemia, unspecified, in remission: Secondary | ICD-10-CM | POA: Diagnosis not present

## 2016-09-18 DIAGNOSIS — Z8249 Family history of ischemic heart disease and other diseases of the circulatory system: Secondary | ICD-10-CM | POA: Insufficient documentation

## 2016-09-18 DIAGNOSIS — K2270 Barrett's esophagus without dysplasia: Secondary | ICD-10-CM | POA: Insufficient documentation

## 2016-09-18 DIAGNOSIS — E119 Type 2 diabetes mellitus without complications: Secondary | ICD-10-CM | POA: Insufficient documentation

## 2016-09-18 DIAGNOSIS — K828 Other specified diseases of gallbladder: Secondary | ICD-10-CM | POA: Insufficient documentation

## 2016-09-18 DIAGNOSIS — I351 Nonrheumatic aortic (valve) insufficiency: Secondary | ICD-10-CM | POA: Insufficient documentation

## 2016-09-18 DIAGNOSIS — Z808 Family history of malignant neoplasm of other organs or systems: Secondary | ICD-10-CM | POA: Diagnosis not present

## 2016-09-18 DIAGNOSIS — Z833 Family history of diabetes mellitus: Secondary | ICD-10-CM | POA: Insufficient documentation

## 2016-09-18 DIAGNOSIS — E785 Hyperlipidemia, unspecified: Secondary | ICD-10-CM | POA: Insufficient documentation

## 2016-09-18 DIAGNOSIS — Z419 Encounter for procedure for purposes other than remedying health state, unspecified: Secondary | ICD-10-CM

## 2016-09-18 DIAGNOSIS — I252 Old myocardial infarction: Secondary | ICD-10-CM | POA: Diagnosis not present

## 2016-09-18 DIAGNOSIS — Z8673 Personal history of transient ischemic attack (TIA), and cerebral infarction without residual deficits: Secondary | ICD-10-CM | POA: Diagnosis not present

## 2016-09-18 DIAGNOSIS — Z955 Presence of coronary angioplasty implant and graft: Secondary | ICD-10-CM | POA: Diagnosis not present

## 2016-09-18 DIAGNOSIS — K8010 Calculus of gallbladder with chronic cholecystitis without obstruction: Secondary | ICD-10-CM | POA: Insufficient documentation

## 2016-09-18 DIAGNOSIS — Z7902 Long term (current) use of antithrombotics/antiplatelets: Secondary | ICD-10-CM | POA: Insufficient documentation

## 2016-09-18 DIAGNOSIS — K449 Diaphragmatic hernia without obstruction or gangrene: Secondary | ICD-10-CM | POA: Diagnosis not present

## 2016-09-18 HISTORY — PX: CHOLECYSTECTOMY: SHX55

## 2016-09-18 LAB — GLUCOSE, CAPILLARY
GLUCOSE-CAPILLARY: 142 mg/dL — AB (ref 65–99)
Glucose-Capillary: 139 mg/dL — ABNORMAL HIGH (ref 65–99)

## 2016-09-18 SURGERY — LAPAROSCOPIC CHOLECYSTECTOMY WITH INTRAOPERATIVE CHOLANGIOGRAM
Anesthesia: General | Site: Abdomen

## 2016-09-18 MED ORDER — PROPOFOL 10 MG/ML IV BOLUS
INTRAVENOUS | Status: DC | PRN
  Administered 2016-09-18: 150 mg via INTRAVENOUS
  Administered 2016-09-18: 50 mg via INTRAVENOUS

## 2016-09-18 MED ORDER — TRAMADOL HCL 50 MG PO TABS: 50.0000 mg | ORAL_TABLET | Freq: Four times a day (QID) | ORAL | 1 refills | Status: DC | PRN

## 2016-09-18 MED ORDER — HEMOSTATIC AGENTS (NO CHARGE) OPTIME
TOPICAL | Status: DC | PRN
  Administered 2016-09-18: 1 via TOPICAL

## 2016-09-18 MED ORDER — SODIUM CHLORIDE 0.9 % IV SOLN
INTRAVENOUS | Status: DC | PRN
  Administered 2016-09-18: 7 mL

## 2016-09-18 MED ORDER — BUPIVACAINE HCL (PF) 0.25 % IJ SOLN
INTRAMUSCULAR | Status: AC
  Filled 2016-09-18: qty 30

## 2016-09-18 MED ORDER — ROCURONIUM BROMIDE 10 MG/ML (PF) SYRINGE
PREFILLED_SYRINGE | INTRAVENOUS | Status: DC | PRN
  Administered 2016-09-18: 10 mg via INTRAVENOUS
  Administered 2016-09-18: 40 mg via INTRAVENOUS
  Administered 2016-09-18: 10 mg via INTRAVENOUS

## 2016-09-18 MED ORDER — PHENYLEPHRINE 40 MCG/ML (10ML) SYRINGE FOR IV PUSH (FOR BLOOD PRESSURE SUPPORT)
PREFILLED_SYRINGE | INTRAVENOUS | Status: AC
  Filled 2016-09-18: qty 10

## 2016-09-18 MED ORDER — HYDROMORPHONE HCL 1 MG/ML IJ SOLN
INTRAMUSCULAR | Status: AC
  Filled 2016-09-18: qty 0.5

## 2016-09-18 MED ORDER — SUCCINYLCHOLINE CHLORIDE 200 MG/10ML IV SOSY
PREFILLED_SYRINGE | INTRAVENOUS | Status: AC
  Filled 2016-09-18: qty 10

## 2016-09-18 MED ORDER — 0.9 % SODIUM CHLORIDE (POUR BTL) OPTIME
TOPICAL | Status: DC | PRN
  Administered 2016-09-18: 1000 mL

## 2016-09-18 MED ORDER — FENTANYL CITRATE (PF) 250 MCG/5ML IJ SOLN
INTRAMUSCULAR | Status: AC
  Filled 2016-09-18: qty 5

## 2016-09-18 MED ORDER — ONDANSETRON HCL 4 MG/2ML IJ SOLN
INTRAMUSCULAR | Status: AC
  Filled 2016-09-18: qty 2

## 2016-09-18 MED ORDER — IOPAMIDOL (ISOVUE-300) INJECTION 61%
INTRAVENOUS | Status: AC
  Filled 2016-09-18: qty 50

## 2016-09-18 MED ORDER — EPHEDRINE 5 MG/ML INJ
INTRAVENOUS | Status: AC
  Filled 2016-09-18: qty 10

## 2016-09-18 MED ORDER — ROCURONIUM BROMIDE 50 MG/5ML IV SOSY
PREFILLED_SYRINGE | INTRAVENOUS | Status: AC
  Filled 2016-09-18: qty 5

## 2016-09-18 MED ORDER — MORPHINE SULFATE (PF) 2 MG/ML IV SOLN: 2.0000 mg | INTRAVENOUS | Status: DC | PRN

## 2016-09-18 MED ORDER — CHLORHEXIDINE GLUCONATE CLOTH 2 % EX PADS: 6.0000 | MEDICATED_PAD | Freq: Once | CUTANEOUS | Status: DC

## 2016-09-18 MED ORDER — LACTATED RINGERS IV SOLN
INTRAVENOUS | Status: DC | PRN
  Administered 2016-09-18: 07:00:00 via INTRAVENOUS

## 2016-09-18 MED ORDER — ONDANSETRON HCL 4 MG/2ML IJ SOLN
INTRAMUSCULAR | Status: DC | PRN
  Administered 2016-09-18: 4 mg via INTRAVENOUS

## 2016-09-18 MED ORDER — SUCCINYLCHOLINE CHLORIDE 20 MG/ML IJ SOLN
INTRAMUSCULAR | Status: DC | PRN
  Administered 2016-09-18: 140 mg via INTRAVENOUS

## 2016-09-18 MED ORDER — FENTANYL CITRATE (PF) 100 MCG/2ML IJ SOLN
INTRAMUSCULAR | Status: DC | PRN
  Administered 2016-09-18: 100 ug via INTRAVENOUS
  Administered 2016-09-18: 50 ug via INTRAVENOUS

## 2016-09-18 MED ORDER — SUGAMMADEX SODIUM 200 MG/2ML IV SOLN
INTRAVENOUS | Status: AC
  Filled 2016-09-18: qty 2

## 2016-09-18 MED ORDER — SUGAMMADEX SODIUM 200 MG/2ML IV SOLN
INTRAVENOUS | Status: DC | PRN
  Administered 2016-09-18: 50 mg via INTRAVENOUS
  Administered 2016-09-18: 200 mg via INTRAVENOUS

## 2016-09-18 MED ORDER — HYDROMORPHONE HCL 1 MG/ML IJ SOLN
0.2500 mg | INTRAMUSCULAR | Status: DC | PRN
  Administered 2016-09-18 (×2): 0.5 mg via INTRAVENOUS

## 2016-09-18 MED ORDER — MIDAZOLAM HCL 2 MG/2ML IJ SOLN
INTRAMUSCULAR | Status: AC
  Filled 2016-09-18: qty 2

## 2016-09-18 MED ORDER — MIDAZOLAM HCL 2 MG/2ML IJ SOLN
INTRAMUSCULAR | Status: DC | PRN
  Administered 2016-09-18: 1 mg via INTRAVENOUS

## 2016-09-18 MED ORDER — LIDOCAINE 2% (20 MG/ML) 5 ML SYRINGE
INTRAMUSCULAR | Status: AC
  Filled 2016-09-18: qty 5

## 2016-09-18 MED ORDER — MEPERIDINE HCL 25 MG/ML IJ SOLN: 6.2500 mg | INTRAMUSCULAR | Status: DC | PRN

## 2016-09-18 MED ORDER — CEFAZOLIN SODIUM-DEXTROSE 2-4 GM/100ML-% IV SOLN
2.0000 g | INTRAVENOUS | Status: AC
  Administered 2016-09-18: 2 g via INTRAVENOUS
  Filled 2016-09-18: qty 100

## 2016-09-18 MED ORDER — PROPOFOL 10 MG/ML IV BOLUS
INTRAVENOUS | Status: AC
  Filled 2016-09-18: qty 20

## 2016-09-18 MED ORDER — TRAMADOL HCL 50 MG PO TABS
ORAL_TABLET | ORAL | Status: AC
  Filled 2016-09-18: qty 1

## 2016-09-18 MED ORDER — LIDOCAINE 2% (20 MG/ML) 5 ML SYRINGE
INTRAMUSCULAR | Status: DC | PRN
  Administered 2016-09-18: 60 mg via INTRAVENOUS

## 2016-09-18 MED ORDER — METHOCARBAMOL 500 MG PO TABS
500.0000 mg | ORAL_TABLET | Freq: Three times a day (TID) | ORAL | Status: DC
  Administered 2016-09-18: 500 mg via ORAL

## 2016-09-18 MED ORDER — BUPIVACAINE-EPINEPHRINE 0.25% -1:200000 IJ SOLN
INTRAMUSCULAR | Status: DC | PRN
  Administered 2016-09-18: 10 mL

## 2016-09-18 MED ORDER — METHOCARBAMOL 500 MG PO TABS
ORAL_TABLET | ORAL | Status: AC
  Filled 2016-09-18: qty 1

## 2016-09-18 MED ORDER — TRAMADOL HCL 50 MG PO TABS
50.0000 mg | ORAL_TABLET | Freq: Four times a day (QID) | ORAL | Status: DC | PRN
  Administered 2016-09-18: 50 mg via ORAL

## 2016-09-18 MED ORDER — PROMETHAZINE HCL 25 MG/ML IJ SOLN: 6.2500 mg | INTRAMUSCULAR | Status: DC | PRN

## 2016-09-18 MED ORDER — SODIUM CHLORIDE 0.9 % IR SOLN
Status: DC | PRN
  Administered 2016-09-18: 1

## 2016-09-18 SURGICAL SUPPLY — 50 items
APL SKNCLS STERI-STRIP NONHPOA (GAUZE/BANDAGES/DRESSINGS) ×1
APPLIER CLIP 5 13 M/L LIGAMAX5 (MISCELLANEOUS) ×3
APR CLP MED LRG 5 ANG JAW (MISCELLANEOUS) ×1
BAG SPEC RTRVL 10 TROC 200 (ENDOMECHANICALS) ×1
BAG SPEC RTRVL LRG 6X4 10 (ENDOMECHANICALS) ×1
BENZOIN TINCTURE PRP APPL 2/3 (GAUZE/BANDAGES/DRESSINGS) ×3 IMPLANT
CANISTER SUCT 3000ML PPV (MISCELLANEOUS) ×3 IMPLANT
CATH REDDICK CHOLANGI 4FR 50CM (CATHETERS) ×3 IMPLANT
CHLORAPREP W/TINT 26ML (MISCELLANEOUS) ×3 IMPLANT
CLIP APPLIE 5 13 M/L LIGAMAX5 (MISCELLANEOUS) ×1 IMPLANT
CLOSURE WOUND 1/2 X4 (GAUZE/BANDAGES/DRESSINGS) ×1
COVER MAYO STAND STRL (DRAPES) ×3 IMPLANT
COVER SURGICAL LIGHT HANDLE (MISCELLANEOUS) ×3 IMPLANT
DECANTER SPIKE VIAL GLASS SM (MISCELLANEOUS) ×3 IMPLANT
DEVICE TROCAR PUNCTURE CLOSURE (ENDOMECHANICALS) ×2 IMPLANT
DRAPE C-ARM 42X72 X-RAY (DRAPES) ×3 IMPLANT
DRSG TEGADERM 2-3/8X2-3/4 SM (GAUZE/BANDAGES/DRESSINGS) ×12 IMPLANT
ELECT REM PT RETURN 9FT ADLT (ELECTROSURGICAL) ×3
ELECTRODE REM PT RTRN 9FT ADLT (ELECTROSURGICAL) ×1 IMPLANT
GAUZE SPONGE 2X2 8PLY STRL LF (GAUZE/BANDAGES/DRESSINGS) ×1 IMPLANT
GLOVE BIOGEL PI IND STRL 7.0 (GLOVE) IMPLANT
GLOVE BIOGEL PI IND STRL 8 (GLOVE) ×1 IMPLANT
GLOVE BIOGEL PI INDICATOR 7.0 (GLOVE) ×2
GLOVE BIOGEL PI INDICATOR 8 (GLOVE) ×2
GLOVE ECLIPSE 8.0 STRL XLNG CF (GLOVE) ×3 IMPLANT
GLOVE SURG SS PI 7.0 STRL IVOR (GLOVE) ×2 IMPLANT
GOWN STRL REUS W/ TWL LRG LVL3 (GOWN DISPOSABLE) ×3 IMPLANT
GOWN STRL REUS W/TWL LRG LVL3 (GOWN DISPOSABLE) ×9
IV CATH 14GX2 1/4 (CATHETERS) ×3 IMPLANT
KIT BASIN OR (CUSTOM PROCEDURE TRAY) ×3 IMPLANT
KIT ROOM TURNOVER OR (KITS) ×3 IMPLANT
NS IRRIG 1000ML POUR BTL (IV SOLUTION) ×3 IMPLANT
PAD ARMBOARD 7.5X6 YLW CONV (MISCELLANEOUS) ×3 IMPLANT
POUCH RETRIEVAL ECOSAC 10 (ENDOMECHANICALS) IMPLANT
POUCH RETRIEVAL ECOSAC 10MM (ENDOMECHANICALS) ×2
POUCH SPECIMEN RETRIEVAL 10MM (ENDOMECHANICALS) ×3 IMPLANT
SCISSORS LAP 5X35 DISP (ENDOMECHANICALS) ×3 IMPLANT
SET IRRIG TUBING LAPAROSCOPIC (IRRIGATION / IRRIGATOR) ×3 IMPLANT
SLEEVE ENDOPATH XCEL 5M (ENDOMECHANICALS) ×6 IMPLANT
SPECIMEN JAR SMALL (MISCELLANEOUS) ×3 IMPLANT
SPONGE GAUZE 2X2 STER 10/PKG (GAUZE/BANDAGES/DRESSINGS) ×2
STRIP CLOSURE SKIN 1/2X4 (GAUZE/BANDAGES/DRESSINGS) ×2 IMPLANT
SUT MON AB 4-0 PC3 18 (SUTURE) ×3 IMPLANT
TOWEL OR 17X24 6PK STRL BLUE (TOWEL DISPOSABLE) ×3 IMPLANT
TOWEL OR 17X26 10 PK STRL BLUE (TOWEL DISPOSABLE) ×3 IMPLANT
TRAY LAPAROSCOPIC MC (CUSTOM PROCEDURE TRAY) ×3 IMPLANT
TROCAR XCEL BLUNT TIP 100MML (ENDOMECHANICALS) ×3 IMPLANT
TROCAR XCEL NON-BLD 11X100MML (ENDOMECHANICALS) IMPLANT
TROCAR XCEL NON-BLD 5MMX100MML (ENDOMECHANICALS) ×3 IMPLANT
TUBING INSUFFLATION (TUBING) ×3 IMPLANT

## 2016-09-18 NOTE — Anesthesia Procedure Notes (Signed)
Procedure Name: Intubation Date/Time: 09/18/2016 7:58 AM Performed by: Julieta Bellini Pre-anesthesia Checklist: Patient identified, Emergency Drugs available, Suction available and Patient being monitored Patient Re-evaluated:Patient Re-evaluated prior to inductionOxygen Delivery Method: Circle system utilized Preoxygenation: Pre-oxygenation with 100% oxygen Intubation Type: IV induction Ventilation: Mask ventilation with difficulty, Oral airway inserted - appropriate to patient size and Two handed mask ventilation required Laryngoscope Size: Mac and 4 Grade View: Grade II Tube type: Oral Tube size: 7.5 mm Number of attempts: 1 Airway Equipment and Method: Stylet Placement Confirmation: ETT inserted through vocal cords under direct vision,  positive ETCO2 and breath sounds checked- equal and bilateral Secured at: 24 cm Tube secured with: Tape Dental Injury: Teeth and Oropharynx as per pre-operative assessment

## 2016-09-18 NOTE — Interval H&P Note (Signed)
History and Physical Interval Note:  09/18/2016 7:25 AM  Brandon Stone  has presented today for surgery, with the diagnosis of Symptomatic cholelithiasis  The various methods of treatment have been discussed with the patient and family. After consideration of risks, benefits and other options for treatment, the patient has consented to  Procedure(s): LAPAROSCOPIC CHOLECYSTECTOMY WITH INTRAOPERATIVE CHOLANGIOGRAM (N/A) as a surgical intervention .  The patient's history has been reviewed, patient examined, no change in status, stable for surgery.  I have reviewed the patient's chart and labs.  Questions were answered to the patient's satisfaction.     Deloris Moger Lenna Sciara

## 2016-09-18 NOTE — Addendum Note (Signed)
Addendum  created 09/18/16 1031 by Julieta Bellini, CRNA   Anesthesia Intra Meds edited

## 2016-09-18 NOTE — Anesthesia Postprocedure Evaluation (Addendum)
Anesthesia Post Note  Patient: Brandon Stone  Procedure(s) Performed: Procedure(s) (LRB): LAPAROSCOPIC CHOLECYSTECTOMY WITH INTRAOPERATIVE CHOLANGIOGRAM (N/A)  Patient location during evaluation: PACU Anesthesia Type: General Level of consciousness: awake and alert Pain management: pain level controlled Vital Signs Assessment: post-procedure vital signs reviewed and stable Respiratory status: spontaneous breathing, nonlabored ventilation, respiratory function stable and patient connected to nasal cannula oxygen Cardiovascular status: blood pressure returned to baseline and stable Postop Assessment: no signs of nausea or vomiting Anesthetic complications: no       Last Vitals:  Vitals:   09/18/16 0930 09/18/16 0945  BP: 128/72 135/75  Pulse: (!) 55 (!) 56  Resp: 13 20  Temp:      Last Pain:  Vitals:   09/18/16 0947  TempSrc:   PainSc: 5                  Nylia Gavina S

## 2016-09-18 NOTE — Transfer of Care (Signed)
Immediate Anesthesia Transfer of Care Note  Patient: Brandon Stone  Procedure(s) Performed: Procedure(s): LAPAROSCOPIC CHOLECYSTECTOMY WITH INTRAOPERATIVE CHOLANGIOGRAM (N/A)  Patient Location: PACU  Anesthesia Type:General  Level of Consciousness: awake, alert , oriented and patient cooperative  Airway & Oxygen Therapy: Patient Spontanous Breathing and Patient connected to nasal cannula oxygen  Post-op Assessment: Report given to RN, Post -op Vital signs reviewed and stable and Patient moving all extremities X 4  Post vital signs: Reviewed and stable  Last Vitals:  Vitals:   09/18/16 0634 09/18/16 0900  BP: (!) 143/83 (!) 143/77  Pulse: 66 (!) 59  Resp: 20 20  Temp: 36.8 C 36.4 C    Last Pain:  Vitals:   09/18/16 0634  TempSrc: Oral      Patients Stated Pain Goal: 3 (03/40/35 2481)  Complications: No apparent anesthesia complications

## 2016-09-18 NOTE — Interval H&P Note (Signed)
History and Physical Interval Note:  09/18/2016 7:28 AM  Brandon Stone  has presented today for surgery, with the diagnosis of Symptomatic cholelithiasis  The various methods of treatment have been discussed with the patient and family. After consideration of risks, benefits and other options for treatment, the patient has consented to  Procedure(s): LAPAROSCOPIC CHOLECYSTECTOMY WITH INTRAOPERATIVE CHOLANGIOGRAM (N/A) as a surgical intervention .  The patient's history has been reviewed, patient examined, no change in status, stable for surgery.  I have reviewed the patient's chart and labs.  Questions were answered to the patient's satisfaction.     Eran Windish Lenna Sciara

## 2016-09-18 NOTE — Op Note (Signed)
OPERATIVE NOTE-LAPAROSCOPIC CHOLECYSTECTOMY  Preoperative diagnosis: Symptomatic cholelithiasis  Postoperative diagnosis: Same  Procedure: Laparoscopic cholecystectomy with cholangiogram.  Surgeon: Jackolyn Confer, M.D.  Asst.: Autumn Messing MD  Indication for assistant: Patient had previous surgery with mesh placement thus assistant necessary to help in safely placing ports and lysing adhesions.  Anesthesia: General  EBL: Less than 100 cc  Indication:   This is a 71 year old male who has symptomatic cholelithiasis.  He now presents for elective laparoscopic cholecystectomy.  He has held his aspirin and Plavix as directed.  Technique: He was brought to the operating room, placed supine on the operating table, and a general anesthetic was administered. The hair on the abdominal wall was clipped as was necessary. The abdominal wall was then sterilely prepped and draped.  A timeout was performed.   He was placed in slight reverse Trendelenburg position.  A 5 mm incision was made in the right subcostal area.  Using a 5 mm Optiview trocar and laparoscope, access was gained into the peritoneal cavity.  There was no bleeding or organ injury under the trocar.  Adhesions were noted between the omentum and anterior abdominal wall in the midline and to the right of midline.  Mesh was visualized around the periumbilical area.  A 5 mm trocar was placed through the epigastric incision.  A 5 mm trocar was then placed in the right lateral abdomen.  Using sharp dissection adhesions were lysed exposing an area to the right of the upper midline.  A 5 mm trocar was then placed here.  I then upsized the 5 mm epigastric trocar to a 12 mm trocar.  He was then placed in more reverse Trendelenburg position with the right side tilted slightly up.  The gallbladder was visualized and the fundus was grasped and retracted toward the right shoulder.  The infundibulum was mobilized with dissection close to the gallbladder and  retracted laterally. The cystic duct was identified and a window was created around it. The cystic artery was also identified and a window was created around it. The critical view was achieved. A clip was placed at the neck of the gallbladder. A small incision was made in the cystic duct. A cholangiocatheter was introduced through the anterior abdominal wall and placed in the cystic duct. A intraoperative cholangiogram was then performed.  Under real-time fluoroscopy, dilute contrast was injected into the cystic duct.  The common hepatic duct, the right and left hepatic ducts, and the common duct were all visualized. Contrast drained into the duodenum without obvious evidence of any obstructing ductal lesion. The final report is pending the Radiologist's interpretation.  The cholangiocatheter was removed, the cystic duct was clipped 3 times on the biliary side, and then the cystic duct was divided sharply. No bile leak was noted from the cystic duct stump.  The cystic artery was then clipped and divided. Following this the gallbladder was dissected free from the liver using electrocautery. The gallbladder was then placed in a retrieval bag and removed from the abdominal cavity through the epigastric incision.  The gallbladder fossa was inspected, irrigated, and bleeding was controlled with electrocautery. Inspection showed that hemostasis was adequate and there was no evidence of bile leak.  A piece of Surgicel was placed in the gallbladder fossa.  The irrigation fluid was evacuated as much as possible.  The epigastric trocar was removed and the fascial defect closed with 0 Vicryl suture.  The remaining trocars were removed and the pneumoperitoneum was released. The skin incisions were closed  with 4-0 Monocryl subcuticular stitches. Steri-Strips and sterile dressings were applied.  The procedure was well-tolerated without any apparent complications. He was taken to the recovery room in satisfactory  condition.

## 2016-09-18 NOTE — Anesthesia Preprocedure Evaluation (Addendum)
Anesthesia Evaluation  Patient identified by MRN, date of birth, ID band Patient awake    Reviewed: Allergy & Precautions, NPO status , Patient's Chart, lab work & pertinent test results  Airway Mallampati: II  TM Distance: >3 FB Neck ROM: Full    Dental no notable dental hx. (+) Teeth Intact, Dental Advisory Given   Pulmonary neg pulmonary ROS, former smoker,    Pulmonary exam normal breath sounds clear to auscultation       Cardiovascular hypertension, + CAD, + Past MI, + CABG and +CHF  Normal cardiovascular exam Rhythm:Regular Rate:Normal     Neuro/Psych negative neurological ROS  negative psych ROS   GI/Hepatic Neg liver ROS, GERD  ,  Endo/Other  diabetes  Renal/GU negative Renal ROS  negative genitourinary   Musculoskeletal negative musculoskeletal ROS (+)   Abdominal   Peds negative pediatric ROS (+)  Hematology negative hematology ROS (+)   Anesthesia Other Findings   Reproductive/Obstetrics negative OB ROS                            Anesthesia Physical Anesthesia Plan  ASA: III  Anesthesia Plan: General   Post-op Pain Management:    Induction: Intravenous  Airway Management Planned: Oral ETT  Additional Equipment:   Intra-op Plan:   Post-operative Plan: Extubation in OR  Informed Consent: I have reviewed the patients History and Physical, chart, labs and discussed the procedure including the risks, benefits and alternatives for the proposed anesthesia with the patient or authorized representative who has indicated his/her understanding and acceptance.   Dental advisory given  Plan Discussed with: CRNA, Surgeon and Anesthesiologist  Anesthesia Plan Comments:        Anesthesia Quick Evaluation

## 2016-09-18 NOTE — Progress Notes (Signed)
Patient with minimal discomfort and very stable post-operatively.  Able to ambulate, void, and tolerate PO intake.  Wishes to discharge home if possible rather than staying overnight.  Dr. Zella Richer paged and will place discharge orders.

## 2016-09-18 NOTE — H&P (Signed)
Brandon Stone is an 71 y.o. male.    HPI:  71 year old male with symptomatic cholelithiasis.  He presents for elective laparoscopic cholecystectomy.  He stopped his Plavix 5 days ago.  Past Medical History:  Diagnosis Date  . Anginal pain ([place])   . Arthritis   . Barrett's esophagus   . BPH (benign prostatic hypertrophy)   . CAD (coronary artery disease)     a. CABG 1991;  b.  [place];  c.  S/P PTCA  stents to SVG ot OM last in 2003;  d.  Redo CABG x 2 in 2004 (VG->OM->LCX, LRA->PDA);  e. 12/2011 Cath: 3vd, sev LM into LCX dzs, VG->OM 100, LIMA->DIAG->LAD patent, Rad Art->PDA patent, EF 45%, Med Rx.  . Diverticulosis   . DM (diabetes mellitus) ([place])   . Dyslipidemia   . Dyspnea   . Gallstones   . GERD (gastroesophageal reflux disease)   . Gout   . History of hiatal hernia   . HTN (hypertension)   . Leukemia in remission ([place])   . Myocardial infarction    1998  . Psoriasis   . Rheumatic fever    as a child  . S/P coronary artery stent placement 1998  . TIA (transient ischemic attack)    FOUND ON MRI  PER PATIENT-     NO PROBLEM    Past Surgical History:  Procedure Laterality Date  . APPENDECTOMY    . COLONOSCOPY  07/2013  . CORONARY ANGIOPLASTY     STENTS  . CORONARY ARTERY BYPASS GRAFT     1991 (LIMA to LAD/Diag;  SVG to RCA;  SVG to OM)  . CORONARY ARTERY BYPASS GRAFT     2004 (L radial to PDA; SVG to OM1/ distal LCx)  . TONSILLECTOMY AND ADENOIDECTOMY    . UMBILICAL HERNIA REPAIR      Family History  Problem Relation Age of Onset  . Heart disease Mother   . Hypertension Mother   . Hyperlipidemia Mother   . Diabetes Mother   . Skin cancer Mother     skin  . Heart disease Father   . Hypertension Father   . Hyperlipidemia Father   . Diabetes Father   . Colon cancer Neg Hx    Social History:  reports that he quit smoking about 45 years ago. He quit after 8.00 years of use. He has never used smokeless tobacco. He reports that he does not drink alcohol or use  drugs.  Allergies:  Allergies  Allergen Reactions  . Other Hives    Intolerance to strong pain medications  . Black Pepper [[place]]     HEART PALPITATIONS  . Ticlopidine Hcl Hives and Itching  . Codeine Palpitations and Other (See Comments)    "wild dreams"    Facility-Administered Medications Prior to Admission  Medication Dose Route Frequency Provider Last Rate Last Dose  . 0.9 %  sodium chloride infusion  500 mL Intravenous Continuous Manus Gunning, MD       Medications Prior to Admission  Medication Sig Dispense Refill  . acetaminophen (TYLENOL) 325 MG tablet Take 325 mg by mouth every 6 (six) hours as needed for mild pain.    Marland Kitchen allopurinol (ZYLOPRIM) 300 MG tablet Take 300 mg by mouth daily.      Marland Kitchen amLODipine (NORVASC) 5 MG tablet Take 1 tablet (5 mg total) by mouth daily. 90 tablet 3  . aspirin 81 MG tablet Take 81 mg by mouth daily.      Marland Kitchen  atorvastatin (LIPITOR) 80 MG tablet Take 1 tablet (80 mg total) by mouth daily. (Patient taking differently: Take 80 mg by mouth daily at 6 PM. ) 90 tablet 3  . clopidogrel (PLAVIX) 75 MG tablet Take 1 tablet (75 mg total) by mouth daily. (Patient taking differently: Take 75 mg by mouth every evening. ) 90 tablet 3  . glipiZIDE (GLUCOTROL XL) 2.5 MG 24 hr tablet Take 2.5 mg by mouth daily with breakfast.     . isosorbide mononitrate (IMDUR) 60 MG 24 hr tablet Take 1 tablet (60 mg total) by mouth daily. 90 tablet 3  . Loperamide HCl (IMODIUM PO) Take 1 tablet by mouth daily as needed (loosse stools).    . loratadine (CLARITIN) 10 MG tablet Take 10 mg by mouth daily as needed for allergies.    . metoprolol succinate (TOPROL-XL) 25 MG 24 hr tablet Take 1 tablet (25 mg total) by mouth 2 (two) times daily. 180 tablet 3  . MULTIPLE VITAMIN-FOLIC ACID PO Take 1 tablet by mouth daily. Contains 291 mg folic acid    . Omega-3 Fatty Acids (FISH OIL) 1200 MG CAPS Take 1 capsule by mouth every evening.     Marland Kitchen omeprazole (PRILOSEC) 20 MG capsule  Take 20 mg by mouth 2 (two) times daily.      . potassium chloride SA (K-DUR,KLOR-CON) 20 MEQ tablet Take 1 tablet (20 mEq total) by mouth daily. (Patient taking differently: Take 20 mEq by mouth every evening. ) 90 tablet 3  . triamcinolone cream (KENALOG) 0.1 % Apply 1 application topically as needed (on elbows).     . valsartan-hydrochlorothiazide (DIOVAN-HCT) 160-25 MG tablet Take 1 tablet by mouth daily. 90 tablet 3  . nitroGLYCERIN (NITROSTAT) 0.4 MG SL tablet Place 1 tablet (0.4 mg total) under the tongue every 5 (five) minutes as needed for chest pain. *Please call and schedule 1 yr appt* 25 tablet 0    Results for orders placed or performed during the hospital encounter of 09/18/16 (from the past 48 hour(s))  Glucose, capillary     Status: Abnormal   Collection Time: 09/18/16  6:37 AM  Result Value Ref Range   Glucose-Capillary 142 (H) 65 - 99 mg/dL   No results found.  Review of Systems  Constitutional: Negative for chills and fever.  HENT: Negative for congestion.   Respiratory: Negative for cough.   Gastrointestinal: Negative for abdominal pain and nausea.    Blood pressure (!) 143/83, pulse 66, temperature 98.3 F (36.8 C), temperature source Oral, resp. rate 20, weight 102.1 kg (225 lb), SpO2 93 %. Physical Exam  Constitutional: He appears well-developed and well-nourished. No distress.  HENT:  Head: Normocephalic and atraumatic.  Cardiovascular: Normal rate and regular rhythm.   Respiratory: Effort normal and breath sounds normal.  midsternal scar  GI: Soft. He exhibits no mass. There is no tenderness.  Scar present in mid abdomen.     Assessment/Plan Symptomatic cholelithiasis.  Plan:  Laparoscopic cholecystectomy with IOC.  [name], MD 09/18/2016, 7:17 AM

## 2016-09-18 NOTE — Discharge Instructions (Signed)
[facility] ______[facility] LAPAROSCOPIC CHOLECYSTECTOMY SURGERY: POST OP INSTRUCTIONS Always review your discharge instruction sheet given to you by the facility where your surgery was performed. IF YOU HAVE DISABILITY OR FAMILY LEAVE FORMS, YOU MUST BRING THEM TO THE OFFICE FOR PROCESSING.   DO NOT GIVE THEM TO YOUR DOCTOR.  1. A prescription for pain medication may be given to you upon discharge.  Take your pain medication as prescribed, if needed.  If narcotic pain medicine is not needed, then you may take acetaminophen (Tylenol) or ibuprofen (Advil) as needed. 2. Take your usually prescribed medications unless otherwise directed. 3. If you need a refill on your pain medication, please contact your pharmacy.  They will contact our office to request authorization. Prescriptions will not be filled after 5pm or on week-ends. 4. You should follow a light diet the first few days after arrival home, such as soup and crackers, etc.  Be sure to include lots of fluids daily.  May start lowfat, solid foods 2 days after the surgery. 5. Most patients will experience some swelling and bruising in the area of the incisions.  Ice packs will help.  Swelling and bruising can take several days to resolve.  6. It is common to experience some constipation if taking pain medication after surgery.  Increasing fluid intake and taking a stool softener (such as Colace) will usually help or prevent this problem from occurring.  A mild laxative (Milk of Magnesia or Miralax) should be taken according to package instructions if there are no bowel movements after 48 hours. 7. Unless discharge instructions indicate otherwise, you may remove your bandages 72 hours after surgery.  You may shower the day after surgery.  You may have steri-strips (small skin tapes) in place directly over the incision.  These strips should be left on the skin until they fall off.  If your surgeon used skin glue on the incision, you may  shower in 24 hours.  The glue will flake off over the next 2-3 weeks.  Any sutures or staples will be removed at the office during your follow-up visit. 8. ACTIVITIES:  You may resume regular (light) daily activities beginning the next day--such as daily self-care, walking, climbing stairs--gradually increasing activities as tolerated.  You may have sexual intercourse when it is comfortable.  Refrain from any heavy lifting or straining for two weeks.  Do not lift anything over 10 pounds during that time.  a. You may drive when you are no longer taking prescription pain medication, you can comfortably wear a seatbelt, and you can safely maneuver your car and apply brakes. b. RETURN TO WORK:  Desk type work in 1 week, full duty work in 2 weeks if you are pain-free.________________________________________________________ 9. You should see your doctor in the office for a follow-up appointment approximately 2-3 weeks after your surgery.  Make sure that you call for this appointment within a day or two after you arrive home to insure a convenient appointment time. 10. OTHER INSTRUCTIONS: _Restart Aspirin 09/20/16, restart Plavix 09/21/16._________________________________________________________________________________________________________________________ __________________________________________________________________________________________________________________________ WHEN TO CALL YOUR DOCTOR: 1. Fever over 101.0 2. Inability to urinate 3. Continued bleeding from incision. 4. Increased pain, redness, or drainage from the incision. 5. Increasing abdominal pain  The clinic staff is available to answer your questions during regular business hours.  Please dont hesitate to call and ask to speak to one of the nurses for clinical concerns.  If you have a medical emergency, go to the nearest emergency room or call  911.  A surgeon from [facility] is always on call at the hospital. [address], [place], [place], [place]  [address] ? [address], [place], [place]   [address] [phone] ? [phone] ? [phone] Web site: [email or web address]

## 2016-09-19 ENCOUNTER — Encounter (HOSPITAL_COMMUNITY): Payer: Self-pay | Admitting: General Surgery

## 2016-09-29 ENCOUNTER — Other Ambulatory Visit: Payer: Self-pay | Admitting: Internal Medicine

## 2016-09-29 DIAGNOSIS — E785 Hyperlipidemia, unspecified: Secondary | ICD-10-CM

## 2016-10-08 ENCOUNTER — Other Ambulatory Visit: Payer: Self-pay | Admitting: Internal Medicine

## 2016-11-21 ENCOUNTER — Ambulatory Visit (INDEPENDENT_AMBULATORY_CARE_PROVIDER_SITE_OTHER): Payer: Medicare HMO | Admitting: Internal Medicine

## 2016-11-21 ENCOUNTER — Encounter: Payer: Self-pay | Admitting: Internal Medicine

## 2016-11-21 VITALS — BP 130/72 | HR 63 | Ht 71.0 in | Wt 214.8 lb

## 2016-11-21 DIAGNOSIS — R5383 Other fatigue: Secondary | ICD-10-CM

## 2016-11-21 DIAGNOSIS — I2510 Atherosclerotic heart disease of native coronary artery without angina pectoris: Secondary | ICD-10-CM | POA: Diagnosis not present

## 2016-11-21 DIAGNOSIS — R0600 Dyspnea, unspecified: Secondary | ICD-10-CM | POA: Diagnosis not present

## 2016-11-21 DIAGNOSIS — R634 Abnormal weight loss: Secondary | ICD-10-CM | POA: Diagnosis not present

## 2016-11-21 DIAGNOSIS — I10 Essential (primary) hypertension: Secondary | ICD-10-CM | POA: Diagnosis not present

## 2016-11-21 DIAGNOSIS — E782 Mixed hyperlipidemia: Secondary | ICD-10-CM

## 2016-11-21 NOTE — Progress Notes (Signed)
Cardiology Office Note   Date:  11/21/2016   ID:  Brandon Stone, DOB Mar 01, 1946, MRN [id number]  PCP:  Janie Morning, DO  Cardiologist:   Dorris Carnes, MD     F/U of CAD    History of Present Illness: Brandon Stone is a 71 y.o. male with a history of CAD (s/p CABG in 1999; [place] in 1998. Redo CABG in 2004 (SVG to OM/LCx; LRA to PDA). Cath in July 2013 due to increased CP showed severe LM disease into Lcx.; SVG to OM 100%; LIMA to Diag and LAD patent; Radial Artery to PDA patent LVEF 45% Plan was to continue medical Rx. Pt was last Seen in March 2017 He underwent choley in March 2018  After discharge he caught URI from wife  ABx given  Finally recovered with mild cough Blood counts done  Mildly elevated Pt says he is still coughing up clear sputum  No fevers No sweats Some SOB with bending  Dizzy and out of breath with bending    Pt says he has wanted to lose wt but recently he is losing 1 to 2 lbs per day   Not trying  He is concerned    Sleeps with head of bed elevated Doesn't like flat  Current Meds  Medication Sig  . acetaminophen (TYLENOL) 325 MG tablet Take 325 mg by mouth every 6 (six) hours as needed for mild pain.  Marland Kitchen allopurinol (ZYLOPRIM) 300 MG tablet Take 300 mg by mouth daily.    Marland Kitchen amLODipine (NORVASC) 5 MG tablet TAKE 1 TABLET EVERY DAY  . aspirin EC 81 MG tablet Take 81 mg by mouth daily.  Marland Kitchen atorvastatin (LIPITOR) 80 MG tablet TAKE 1 TABLET EVERY DAY  . clopidogrel (PLAVIX) 75 MG tablet Take 75 mg by mouth daily.  Marland Kitchen glipiZIDE (GLUCOTROL XL) 2.5 MG 24 hr tablet Take 2.5 mg by mouth daily with breakfast.   . isosorbide mononitrate (IMDUR) 60 MG 24 hr tablet Take 1 tablet (60 mg total) by mouth daily.  . Loperamide HCl (IMODIUM PO) Take 1 tablet by mouth daily as needed (loosse stools).  . loratadine (CLARITIN) 10 MG tablet Take 10 mg by mouth daily as needed for allergies.  . metoprolol succinate (TOPROL-XL) 25 MG 24 hr tablet TAKE 1 TABLET TWICE DAILY  . MULTIPLE  VITAMIN-FOLIC ACID PO Take 1 tablet by mouth daily. Contains 706 mg folic acid  . nitroGLYCERIN (NITROSTAT) 0.4 MG SL tablet Place 1 tablet (0.4 mg total) under the tongue every 5 (five) minutes as needed for chest pain. *Please call and schedule 1 yr appt*  . Omega-3 Fatty Acids (FISH OIL) 1200 MG CAPS Take 1 capsule by mouth every evening.   Marland Kitchen omeprazole (PRILOSEC) 20 MG capsule Take 20 mg by mouth 2 (two) times daily.    . potassium chloride SA (K-DUR,KLOR-CON) 20 MEQ tablet Take 1 tablet (20 mEq total) by mouth daily. (Patient taking differently: Take 20 mEq by mouth every evening. )  . triamcinolone cream (KENALOG) 0.1 % Apply 1 application topically as needed (on elbows).   . valsartan-hydrochlorothiazide (DIOVAN-HCT) 160-25 MG tablet TAKE 1 TABLET EVERY DAY     Allergies:   Other; Ticlopidine hcl; Black pepper [piper]; and Codeine   Past Medical History:  Diagnosis Date  . Anginal pain ([place])   . Arthritis   . Barrett's esophagus   . BPH (benign prostatic hypertrophy)   . CAD (coronary artery disease)     a. CABG 1991;  b.  IWMI  1998;  c.  S/P PTCA  stents to SVG ot OM last in 2003;  d.  Redo CABG x 2 in 2004 (VG->OM->LCX, LRA->PDA);  e. 12/2011 Cath: 3vd, sev LM into LCX dzs, VG->OM 100, LIMA->DIAG->LAD patent, Rad Art->PDA patent, EF 45%, Med Rx.  . Diverticulosis   . DM (diabetes mellitus) ([place])   . Dyslipidemia   . Dyspnea   . Gallstones   . GERD (gastroesophageal reflux disease)   . Gout   . History of hiatal hernia   . HTN (hypertension)   . Leukemia in remission ([place])   . Myocardial infarction ([place])    1998  . Psoriasis   . Rheumatic fever    as a child  . S/P coronary artery stent placement 1998  . TIA (transient ischemic attack)    FOUND ON MRI  PER PATIENT-     NO PROBLEM    Past Surgical History:  Procedure Laterality Date  . APPENDECTOMY    . CHOLECYSTECTOMY N/A 09/18/2016   Procedure: LAPAROSCOPIC CHOLECYSTECTOMY WITH INTRAOPERATIVE CHOLANGIOGRAM;   Surgeon: Jackolyn Confer, MD;  Location: [facility];  Service: General;  Laterality: N/A;  . COLONOSCOPY  07/2013  . CORONARY ANGIOPLASTY     STENTS  . CORONARY ARTERY BYPASS GRAFT     1991 (LIMA to LAD/Diag;  SVG to RCA;  SVG to OM)  . CORONARY ARTERY BYPASS GRAFT     2004 (L radial to PDA; SVG to OM1/ distal LCx)  . TONSILLECTOMY AND ADENOIDECTOMY    . UMBILICAL HERNIA REPAIR       Social History:  The patient  reports that he quit smoking about 45 years ago. He quit after 8.00 years of use. He has never used smokeless tobacco. He reports that he does not drink alcohol or use drugs.   Family History:  The patient's family history includes Diabetes in his father and mother; Heart disease in his father and mother; Hyperlipidemia in his father and mother; Hypertension in his father and mother; Skin cancer in his mother.    ROS:  Please see the history of present illness. All other systems are reviewed and  Negative to the above problem except as noted.    PHYSICAL EXAM: VS:  BP 130/72   Pulse 63   Ht 5\' 11"  (1.803 m)   Wt 97.4 kg (214 lb 12.8 oz)   SpO2 95%   BMI 29.96 kg/m   GEN: Obese 71 yo, in no acute distress  HEENT: normal  Neck: no JVD, carotid bruits, or masses Cardiac: RRR; no murmurs, rubs, or gallops,no edema  Respiratory:  clear to auscultation bilaterally, normal work of breathing GI: soft, nontender, distended, + BS  No hepatomegaly  MS: no deformity Moving all extremities   Skin: warm and dry, no rash Neuro:  Strength and sensation are intact Psych: euthymic mood, full affect   EKG:  EKG is not ordered today.   Lipid Panel    Component Value Date/Time   CHOL 160 10/29/2015 0849   TRIG 211 (H) 10/29/2015 0849   HDL 37 (L) 10/29/2015 0849   CHOLHDL 4.3 10/29/2015 0849   VLDL 42 (H) 10/29/2015 0849   LDLCALC 81 10/29/2015 0849   LDLDIRECT 119.2 06/27/2013 0849      Wt Readings from Last 3 Encounters:  11/21/16 97.4 kg (214 lb 12.8 oz)  09/18/16 102.1  kg (225 lb)  09/11/16 102.1 kg (225 lb 1.6 oz)      ASSESSMENT AND PLAN:  1  Fatigue  /  dyspnea  Concerning  VOlume status appears OK  Will get echo to reeval LVEF  Check CBC, BMET and BNP  2  CAD  I am not convinced episodes reflect angina  Follow for now  Echo  3  HL  Keep on lipitor  4.  HTN  Keep on current meds    5  Wt loss  Follow for now     Current medicines are reviewed at length with the patient today.  The patient does not have concerns regarding medicines.  Signed, Dorris Carnes, MD  11/21/2016 10:21 AM    [address], [place], [place]  [address] Phone: [phone]; Fax: [phone]

## 2016-11-21 NOTE — Patient Instructions (Signed)
Your physician recommends that you continue on your current medications as directed. Please refer to the Current Medication list given to you today.  Your physician recommends that you return for lab work today (BMET, BNP, CBC LIPIDS)  Your physician has requested that you have an echocardiogram. Echocardiography is a painless test that uses sound waves to create images of your heart. It provides your doctor with information about the size and shape of your heart and how well your heart's chambers and valves are working. This procedure takes approximately one hour. There are no restrictions for this procedure.  Your physician wants you to follow-up in: [place].  You will receive a reminder letter in the mail two months in advance. If you don't receive a letter, please call our office to schedule the follow-up appointment.

## 2016-11-22 LAB — LIPID PANEL
CHOL/HDL RATIO: 4.5 ratio (ref 0.0–5.0)
CHOLESTEROL TOTAL: 210 mg/dL — AB (ref 100–199)
HDL: 47 mg/dL (ref >39)
LDL CALC: 133 mg/dL — AB (ref 0–99)
Triglycerides: 152 mg/dL — ABNORMAL HIGH (ref 0–149)
VLDL Cholesterol Cal: 30 mg/dL (ref 5–40)

## 2016-11-22 LAB — BASIC METABOLIC PANEL
BUN/Creatinine Ratio: 26 — ABNORMAL HIGH (ref 10–24)
BUN: 26 mg/dL (ref 8–27)
CHLORIDE: 103 mmol/L (ref 96–106)
CO2: 24 mmol/L (ref 18–29)
Calcium: 10 mg/dL (ref 8.6–10.2)
Creatinine, Ser: 1.01 mg/dL (ref 0.76–1.27)
GFR calc Af Amer: 88 mL/min/{1.73_m2} (ref >59)
GFR, EST NON AFRICAN AMERICAN: 76 mL/min/{1.73_m2} (ref >59)
GLUCOSE: 121 mg/dL — AB (ref 65–99)
POTASSIUM: 3.9 mmol/L (ref 3.5–5.2)
SODIUM: 142 mmol/L (ref 134–144)

## 2016-11-22 LAB — CBC
HEMOGLOBIN: 15.3 g/dL (ref 13.0–17.7)
Hematocrit: 44.2 % (ref 37.5–51.0)
MCH: 31.9 pg (ref 26.6–33.0)
MCHC: 34.6 g/dL (ref 31.5–35.7)
MCV: 92 fL (ref 79–97)
Platelets: 220 10*3/uL (ref 150–379)
RBC: 4.8 x10E6/uL (ref 4.14–5.80)
RDW: 14.2 % (ref 12.3–15.4)
WBC: 13.8 10*3/uL — AB (ref 3.4–10.8)

## 2016-11-22 LAB — PRO B NATRIURETIC PEPTIDE: NT-Pro BNP: 123 pg/mL (ref 0–376)

## 2016-11-24 NOTE — Addendum Note (Signed)
Addendum  created 11/24/16 1249 by Arrion Burruel, MD   Sign clinical note    

## 2016-11-26 ENCOUNTER — Other Ambulatory Visit: Payer: Self-pay | Admitting: *Deleted

## 2016-11-26 DIAGNOSIS — E7849 Other hyperlipidemia: Secondary | ICD-10-CM

## 2016-12-01 ENCOUNTER — Other Ambulatory Visit: Payer: Self-pay

## 2016-12-01 ENCOUNTER — Ambulatory Visit (HOSPITAL_COMMUNITY): Payer: Medicare HMO | Attending: Cardiology

## 2016-12-01 DIAGNOSIS — I2510 Atherosclerotic heart disease of native coronary artery without angina pectoris: Secondary | ICD-10-CM

## 2016-12-01 DIAGNOSIS — I0989 Other specified rheumatic heart diseases: Secondary | ICD-10-CM | POA: Diagnosis not present

## 2016-12-01 DIAGNOSIS — I082 Rheumatic disorders of both aortic and tricuspid valves: Secondary | ICD-10-CM | POA: Insufficient documentation

## 2016-12-01 DIAGNOSIS — I5030 Unspecified diastolic (congestive) heart failure: Secondary | ICD-10-CM | POA: Diagnosis not present

## 2016-12-01 DIAGNOSIS — I420 Dilated cardiomyopathy: Secondary | ICD-10-CM | POA: Diagnosis not present

## 2016-12-01 DIAGNOSIS — E782 Mixed hyperlipidemia: Secondary | ICD-10-CM | POA: Diagnosis not present

## 2016-12-22 ENCOUNTER — Other Ambulatory Visit: Payer: Self-pay | Admitting: Internal Medicine

## 2016-12-22 MED ORDER — ATORVASTATIN CALCIUM 80 MG PO TABS: 80.0000 mg | ORAL_TABLET | Freq: Every day | ORAL | 3 refills | Status: DC

## 2016-12-22 MED ORDER — ATORVASTATIN CALCIUM 80 MG PO TABS: 80.0000 mg | ORAL_TABLET | Freq: Every day | ORAL | 0 refills | Status: DC

## 2016-12-29 ENCOUNTER — Encounter: Payer: Self-pay | Admitting: Internal Medicine

## 2017-01-14 ENCOUNTER — Other Ambulatory Visit: Payer: Self-pay | Admitting: Internal Medicine

## 2017-01-23 ENCOUNTER — Other Ambulatory Visit: Payer: Medicare HMO | Admitting: *Deleted

## 2017-01-23 DIAGNOSIS — E7849 Other hyperlipidemia: Secondary | ICD-10-CM

## 2017-01-23 LAB — LIPID PANEL
CHOLESTEROL TOTAL: 163 mg/dL (ref 100–199)
Chol/HDL Ratio: 4.3 ratio (ref 0.0–5.0)
HDL: 38 mg/dL — AB (ref >39)
LDL Calculated: 84 mg/dL (ref 0–99)
TRIGLYCERIDES: 206 mg/dL — AB (ref 0–149)
VLDL CHOLESTEROL CAL: 41 mg/dL — AB (ref 5–40)

## 2017-02-04 NOTE — Progress Notes (Signed)
[place] [facility] [address], [place], [place] [address] [phone]  Date:  02/05/2017   Name:  Brandon Stone   DOB:  1945-12-31   MRN:  [id number]  PCP:  Brandon Mclean, MD    Chief Complaint: Establish Care   History of Present Illness:  Brandon Stone is a 71 y.o. very pleasant male patient who presents with the following:  Here today as a new patient to our practice.  He had been a pt of a Development worker, international aid, [facility].  They saw him for a CPE in June of this year:  Brandon Reel, DO - 12/19/2016 9:45 AM EDT Formatting of this note may be different from the original. [place] [id number] [place] is a 71 y.o. male DOB: 07/16/1945   Subjective  Brandon Stone presents for annual wellness as well as follow up of other chronic medical problems as needed.   Health Maintenance Status  Date Due Completion Dates  Prevnar >65 05/28/2013 ---  URINE MICROALBUMIN 11/06/2016 11/07/2015  INFLUENZA VACCINE 01/21/2017 05/06/2016, 05/07/2015 (Done)  FOOT EXAM 05/09/2017 05/09/2016 (Done), 11/08/2015 (Done)  Medicare Annual Wellness Visit 05/09/2017 05/09/2016  HEMOGLOBIN A1C 06/16/2017 12/15/2016, 08/13/2016  OPHTHALMOLOGY EXAM 07/24/2017 07/24/2016 (Done), 06/24/2007 (Done)  COLONOSCOPY 07/22/2019 07/21/2016 (Done), 07/21/2016 (Done)    Immunization History  Administered Date(s) Administered  . Influenza High Dose 05/07/2015  . Influenza High Dose (FLUZONE) 05/06/2016  . Tdap (ADACEL, [place]) 12/19/2016  . pneumococcal polysaccharide 23-valent (PNEUMOVAX 23) 12/19/2014   Chief Complaint  Patient presents with  . Medication Management  Patient is here to discuss medication management. Patient states he does not need any refills at this time.  . Annual Exam  Patient is due to for AWV. Patient has a new grandbaby and would  like to verify his Tdap is up to date. Last Colonoscopy was January 2018.   HX OF LEUKEMIA PER PATIENT: Was told by Oncology in [place] that they can just watch it. Usually White cells are usually a little high.   HEART/DM: He sees Dr. Harrington Stone 6 - 12 months. Triglycerides down to 150. LDL 133. HDL 47. He is riding his bike consistently. He is on a strict low cholesterol diet. He stopped the eggs. He has decreased his sugar intake as well. He did have a toe infection of right toe that has resolved. No pus or drainage. Only toenail fungus  GI/GU: Up to date with Colonscopy. Diarrhea is almost gone (bad case of C.diff). Immodium once per week. Reflux controlled.   HEENT: He does have ringing in his ears that is more pronounced at night time when it is quiet. Sometimes louder than others. Increase in sound makes it worse. He denies hearing issues. He turns the TV down when his wife isn't in the room.  __________________________________________________________________  Past Medical History:  Diagnosis Date  . Controlled type 2 diabetes mellitus without complication ([place]) 5/63/1497  . Diabetic neuropathy associated with type 2 diabetes mellitus ([place]) 11/08/2015  . Diverticulosis of intestine without bleeding 11/08/2015  . Gastroesophageal reflux disease without esophagitis 11/08/2015  . Gout of multiple sites 11/08/2015  . S/P CABG x 3 11/08/2015  2004  . S/P CABG x 4 11/08/2015    He has a "mild leukemia" dx a couple of years ago. His oncologist is local but he cannot remember their name, I  do not see these records in my system.   For the time being they are observing his disease  He sees Dr. Harrington Stone for cardiology- he has had CABG twice over his life, in 1991 and 2004. He finds that more recently his angina is better since he is exercising more Dr. Harrington Stone is managing his lipids with lipitor for now  He had some itching/ pain/ numbness in his feet-   He has had mild diabetes/ borderline DM for several  years, but he was just formally dx with DM a couple of years ago. Is on glucotrol for this   He had his gallbladder removed a few months ago.  He does have to watch his diet some, he is feeling well and does not have any RUQ pain any longer  He had 2 children with his first wife, and 2 grands, 2 great grands  He and his second wife married about 6 years ago  His father and mother both had lots of heart issues.  Both are deceased.   Pt did have rheumatic fever as a child of 12 yo  He is taking plavix and baby aspirin- no nosebleeds or blood in his stool  Most recent A1c was in June 6.6% He has a warty growth on his right index finger, and a few AKs on his arms- would like to freeze these today   Patient Active Problem List   Diagnosis Date Noted  . Symptomatic cholelithiasis 09/18/2016  . Chronic lymphocytic leukemia (CLL), B-cell ([place]) 05/13/2014  . Stable angina ([place]) 01/26/2012  . Sore throat 11/03/2010  . Hyperlipidemia 06/21/2008  . HYPERTENSION, BENIGN 06/21/2008  . CAD, NATIVE VESSEL 06/21/2008    Past Medical History:  Diagnosis Date  . Anginal pain ([place])   . Arthritis   . Barrett's esophagus   . BPH (benign prostatic hypertrophy)   . CAD (coronary artery disease)     a. CABG 1991;  b.  [place];  c.  S/P PTCA  stents to SVG ot OM last in 2003;  d.  Redo CABG x 2 in 2004 (VG->OM->LCX, LRA->PDA);  e. 12/2011 Cath: 3vd, sev LM into LCX dzs, VG->OM 100, LIMA->DIAG->LAD patent, Rad Art->PDA patent, EF 45%, Med Rx.  . Diverticulosis   . DM (diabetes mellitus) ([place])   . Dyslipidemia   . Dyspnea   . Gallstones   . GERD (gastroesophageal reflux disease)   . Gout   . History of hiatal hernia   . HTN (hypertension)   . Hyperlipidemia   . Leukemia in remission ([place])   . Psoriasis   . Rheumatic fever    as a child  . S/P coronary artery stent placement 1998  . TIA (transient ischemic attack)    FOUND ON MRI  PER PATIENT-     NO PROBLEM    Past Surgical History:   Procedure Laterality Date  . APPENDECTOMY    . CHOLECYSTECTOMY N/A 09/18/2016   Procedure: LAPAROSCOPIC CHOLECYSTECTOMY WITH INTRAOPERATIVE CHOLANGIOGRAM;  Surgeon: Jackolyn Confer, MD;  Location: [facility];  Service: General;  Laterality: N/A;  . COLONOSCOPY  07/2013  . CORONARY ANGIOPLASTY     STENTS  . CORONARY ARTERY BYPASS GRAFT     1991 (LIMA to LAD/Diag;  SVG to RCA;  SVG to OM)  . CORONARY ARTERY BYPASS GRAFT     2004 (L radial to PDA; SVG to OM1/ distal LCx)  . TONSILLECTOMY AND ADENOIDECTOMY    . UMBILICAL HERNIA REPAIR  Social History  Substance Use Topics  . Smoking status: Former Smoker    Years: 8.00    Quit date: 06/24/1971  . Smokeless tobacco: Never Used  . Alcohol use No    Family History  Problem Relation Age of Onset  . Heart disease Mother   . Hypertension Mother   . Hyperlipidemia Mother   . Diabetes Mother   . Skin cancer Mother        skin  . Heart disease Father   . Hypertension Father   . Hyperlipidemia Father   . Diabetes Father   . Colon cancer Neg Hx     Allergies  Allergen Reactions  . Other Hives    Intolerance to strong pain medications  Rash to opsite and tegaderm tape.  . Ticlopidine Hcl Hives and Itching  . Black Pepper [[place]] Palpitations    HEART PALPITATIONS HEART PALPITATIONS  . Codeine Palpitations and Other (See Comments)    Wild dreams, palpitations "wild dreams"    Medication list has been reviewed and updated.  Current Outpatient Prescriptions on File Prior to Visit  Medication Sig Dispense Refill  . acetaminophen (TYLENOL) 325 MG tablet Take 325 mg by mouth every 6 (six) hours as needed for mild pain.    Marland Kitchen amLODipine (NORVASC) 5 MG tablet Take 1 tablet (5 mg total) by mouth daily. 90 tablet 3  . aspirin EC 81 MG tablet Take 81 mg by mouth daily.    Marland Kitchen atorvastatin (LIPITOR) 80 MG tablet Take 1 tablet (80 mg total) by mouth daily. 90 tablet 3  . clopidogrel (PLAVIX) 75 MG tablet TAKE 1 TABLET EVERY DAY 90 tablet  3  . isosorbide mononitrate (IMDUR) 60 MG 24 hr tablet Take 1 tablet (60 mg total) by mouth daily. 90 tablet 0  . Loperamide HCl (IMODIUM PO) Take 1 tablet by mouth daily as needed (loosse stools).    . loratadine (CLARITIN) 10 MG tablet Take 10 mg by mouth daily as needed for allergies.    . metoprolol succinate (TOPROL-XL) 25 MG 24 hr tablet TAKE 1 TABLET TWICE DAILY 180 tablet 0  . MULTIPLE VITAMIN-FOLIC ACID PO Take 1 tablet by mouth daily. Contains 010 mg folic acid    . nitroGLYCERIN (NITROSTAT) 0.4 MG SL tablet Place 1 tablet (0.4 mg total) under the tongue every 5 (five) minutes as needed for chest pain. *Please call and schedule 1 yr appt* 25 tablet 0  . Omega-3 Fatty Acids (FISH OIL) 1200 MG CAPS Take 1 capsule by mouth every evening.     . potassium chloride SA (K-DUR,KLOR-CON) 20 MEQ tablet Take 1 tablet (20 mEq total) by mouth daily. (Patient taking differently: Take 20 mEq by mouth every evening. ) 90 tablet 3  . valsartan-hydrochlorothiazide (DIOVAN-HCT) 160-25 MG tablet TAKE 1 TABLET EVERY DAY 90 tablet 0   No current facility-administered medications on file prior to visit.     Review of Systems:  As per HPI- otherwise negative. No fever or chills He continues to have mild stable angina, but reports that his sx are actually getting better as of late   Physical Examination: Vitals:   02/05/17 1040  BP: 132/73  Pulse: 60  Temp: 98 F (36.7 C)  SpO2: 99%   Vitals:   02/05/17 1040  Weight: 219 lb 6.4 oz (99.5 kg)  Height: 5\' 11"  (1.803 m)   Body mass index is 30.6 kg/m. Ideal Body Weight: Weight in (lb) to have BMI = 25: 178.9  GEN: WDWN, NAD,  Non-toxic, A & O x 3, overweight, otherwise looks well HEENT: Atraumatic, Normocephalic. Neck supple. No masses, No LAD. Bilateral TM wnl, oropharynx normal.  PEERL,EOMI.   Ears and Nose: No external deformity. CV: RRR, No M/G/R. No JVD. No thrill. No extra heart sounds. PULM: CTA B, no wheezes, crackles, rhonchi. No  retractions. No resp. distress. No accessory muscle use. ABD: S, NT, ND, +BS. No rebound. No HSM. EXTR: No c/c/e NEURO Normal gait.  PSYCH: Normally interactive. Conversant. Not depressed or anxious appearing.  Calm demeanor.   VC obtained.  Used LN to freeze a wart on his left index finger and a few actinic keratosis on his arms  Assessment and Plan: Essential hypertension  Coronary artery disease involving native coronary artery of native heart without angina pectoris  Type 2 diabetes mellitus with diabetic neuropathy, without long-term current use of insulin (HCC)  Diarrhea, unspecified type - Plan: glipiZIDE (GLUCOTROL XL) 2.5 MG 24 hr tablet  History of gout - Plan: allopurinol (ZYLOPRIM) 300 MG tablet  Gastroesophageal reflux disease, esophagitis presence not specified - Plan: omeprazole (PRILOSEC) 20 MG capsule  Eczema, unspecified type - Plan: triamcinolone cream (KENALOG) 0.1 %  BP is under control DM- recnet A1c normal Refilled medications as below He will see me in 3-4 months for a follow-up visit and labs   Signed Lamar Blinks, MD

## 2017-02-05 ENCOUNTER — Ambulatory Visit (INDEPENDENT_AMBULATORY_CARE_PROVIDER_SITE_OTHER): Payer: Medicare HMO | Admitting: Family Medicine

## 2017-02-05 ENCOUNTER — Telehealth: Payer: Self-pay | Admitting: Internal Medicine

## 2017-02-05 ENCOUNTER — Encounter: Payer: Self-pay | Admitting: Family Medicine

## 2017-02-05 VITALS — BP 132/73 | HR 60 | Temp 98.0°F | Ht 71.0 in | Wt 219.4 lb

## 2017-02-05 DIAGNOSIS — I10 Essential (primary) hypertension: Secondary | ICD-10-CM | POA: Diagnosis not present

## 2017-02-05 DIAGNOSIS — E1140 Type 2 diabetes mellitus with diabetic neuropathy, unspecified: Secondary | ICD-10-CM | POA: Diagnosis not present

## 2017-02-05 DIAGNOSIS — L309 Dermatitis, unspecified: Secondary | ICD-10-CM | POA: Diagnosis not present

## 2017-02-05 DIAGNOSIS — I2510 Atherosclerotic heart disease of native coronary artery without angina pectoris: Secondary | ICD-10-CM

## 2017-02-05 DIAGNOSIS — R197 Diarrhea, unspecified: Secondary | ICD-10-CM | POA: Diagnosis not present

## 2017-02-05 DIAGNOSIS — E785 Hyperlipidemia, unspecified: Secondary | ICD-10-CM

## 2017-02-05 DIAGNOSIS — Z8739 Personal history of other diseases of the musculoskeletal system and connective tissue: Secondary | ICD-10-CM | POA: Diagnosis not present

## 2017-02-05 DIAGNOSIS — K219 Gastro-esophageal reflux disease without esophagitis: Secondary | ICD-10-CM

## 2017-02-05 MED ORDER — GLIPIZIDE ER 2.5 MG PO TB24: 2.5000 mg | ORAL_TABLET | Freq: Every day | ORAL | 3 refills | Status: DC

## 2017-02-05 MED ORDER — OMEPRAZOLE 20 MG PO CPDR: 20.0000 mg | DELAYED_RELEASE_CAPSULE | Freq: Two times a day (BID) | ORAL | 3 refills | Status: DC

## 2017-02-05 MED ORDER — TRIAMCINOLONE ACETONIDE 0.1 % EX CREA: 1.0000 "application " | TOPICAL_CREAM | Freq: Two times a day (BID) | CUTANEOUS | 2 refills | Status: DC

## 2017-02-05 MED ORDER — EZETIMIBE 10 MG PO TABS: 10.0000 mg | ORAL_TABLET | Freq: Every day | ORAL | 0 refills | Status: DC

## 2017-02-05 MED ORDER — ALLOPURINOL 300 MG PO TABS: 300.0000 mg | ORAL_TABLET | Freq: Every day | ORAL | 3 refills | Status: DC

## 2017-02-05 NOTE — Patient Instructions (Signed)
It was nice to see you today!  I am glad that your angina is setting down  Please come and see me in 3-4 months to follow-up on your A1c

## 2017-02-05 NOTE — Telephone Encounter (Signed)
New message     Patient returning nurse call from today on lab results.

## 2017-02-05 NOTE — Telephone Encounter (Signed)
See lab results 01/23/17.  Patient aware and in agreement to start Zetia. He requests just a 3 month supply to [facility] and then recheck labs in 8 weeks to evaluate. He has continued to exercise and made some diet changes. [employer] is comparable to [place].

## 2017-02-26 ENCOUNTER — Other Ambulatory Visit: Payer: Self-pay | Admitting: *Deleted

## 2017-02-26 MED ORDER — METOPROLOL SUCCINATE ER 25 MG PO TB24: 25.0000 mg | ORAL_TABLET | Freq: Two times a day (BID) | ORAL | 2 refills | Status: DC

## 2017-02-26 MED ORDER — VALSARTAN-HYDROCHLOROTHIAZIDE 160-25 MG PO TABS: 1.0000 | ORAL_TABLET | Freq: Every day | ORAL | 2 refills | Status: DC

## 2017-02-26 MED ORDER — ISOSORBIDE MONONITRATE ER 60 MG PO TB24: 60.0000 mg | ORAL_TABLET | Freq: Every day | ORAL | 2 refills | Status: DC

## 2017-04-08 ENCOUNTER — Other Ambulatory Visit: Payer: Medicare HMO | Admitting: *Deleted

## 2017-04-08 DIAGNOSIS — E785 Hyperlipidemia, unspecified: Secondary | ICD-10-CM

## 2017-04-08 LAB — LIPID PANEL
CHOLESTEROL TOTAL: 130 mg/dL (ref 100–199)
Chol/HDL Ratio: 3.8 ratio (ref 0.0–5.0)
HDL: 34 mg/dL — ABNORMAL LOW (ref >39)
LDL CALC: 61 mg/dL (ref 0–99)
Triglycerides: 173 mg/dL — ABNORMAL HIGH (ref 0–149)
VLDL CHOLESTEROL CAL: 35 mg/dL (ref 5–40)

## 2017-05-02 NOTE — Progress Notes (Deleted)
[place] [facility] [address] [place], [place] [address] [phone]  Date:  05/04/2017   Name:  Brandon Stone   DOB:  28-Feb-1946   MRN:  [id number]  PCP:  Darreld Mclean, MD    Chief Complaint: No chief complaint on file.   History of Present Illness:  Brandon Stone is a 71 y.o. very pleasant male patient who presents with the following:  Here today for a follow-up visit History of CLL, angina, HTN, hyperlipidemia, CAD, DM Cardiologist is Dr. Harrington Challenger  Last seen by myself in August:  He has a "mild leukemia" dx a couple of years ago. His oncologist is local but he cannot remember their name, I do not see these records in my system.   For the time being they are observing his disease He sees Dr. Harrington Challenger for cardiology- he has had CABG twice over his life, in 1991 and 2004. He finds that more recently his angina is better since he is exercising more Dr. Harrington Challenger is managing his lipids with lipitor for now He had some itching/ pain/ numbness in his feet-  He has had mild diabetes/ borderline DM for several years, but he was just formally dx with DM a couple of years ago. Is on glucotrol for this  He had his gallbladder removed a few months ago.  He does have to watch his diet some, he is feeling well and does not have any RUQ pain any longe He had 2 children with his first wife, and 2 grands, 2 great grands  He and his second wife married about 6 years ago His father and mother both had lots of heart issues.  Both are deceased.   Pt did have rheumatic fever as a child of 39 yo He is taking plavix and baby aspirin- no nosebleeds or blood in his stool   Lab Results  Component Value Date   HGBA1C 7.0 (H) 09/11/2016   Flu: Hep C: A1C: due prevnar 13:   BP Readings from Last 3 Encounters:  02/05/17 132/73  11/21/16 130/72  09/18/16 123/73     Patient Active Problem List   Diagnosis Date Noted  . Symptomatic  cholelithiasis 09/18/2016  . Chronic lymphocytic leukemia (CLL), B-cell ([place]) 05/13/2014  . Stable angina ([place]) 01/26/2012  . Sore throat 11/03/2010  . Hyperlipidemia 06/21/2008  . HYPERTENSION, BENIGN 06/21/2008  . CAD, NATIVE VESSEL 06/21/2008    Past Medical History:  Diagnosis Date  . Anginal pain ([place])   . Arthritis   . Barrett's esophagus   . BPH (benign prostatic hypertrophy)   . CAD (coronary artery disease)     a. CABG 1991;  b.  [place];  c.  S/P PTCA  stents to SVG ot OM last in 2003;  d.  Redo CABG x 2 in 2004 (VG->OM->LCX, LRA->PDA);  e. 12/2011 Cath: 3vd, sev LM into LCX dzs, VG->OM 100, LIMA->DIAG->LAD patent, Rad Art->PDA patent, EF 45%, Med Rx.  . Diverticulosis   . DM (diabetes mellitus) ([place])   . Dyslipidemia   . Dyspnea   . Gallstones   . GERD (gastroesophageal reflux disease)   . Gout   . History of hiatal hernia   . HTN (hypertension)   . Hyperlipidemia   . Leukemia in remission ([place])   . Psoriasis   . Rheumatic fever    as a child  . S/P coronary artery stent placement 1998  . TIA (transient  ischemic attack)    FOUND ON MRI  PER PATIENT-     NO PROBLEM    Past Surgical History:  Procedure Laterality Date  . APPENDECTOMY    . COLONOSCOPY  07/2013  . CORONARY ANGIOPLASTY     STENTS  . CORONARY ARTERY BYPASS GRAFT     1991 (LIMA to LAD/Diag;  SVG to RCA;  SVG to OM)  . CORONARY ARTERY BYPASS GRAFT     2004 (L radial to PDA; SVG to OM1/ distal LCx)  . TONSILLECTOMY AND ADENOIDECTOMY    . UMBILICAL HERNIA REPAIR      Social History   Tobacco Use  . Smoking status: Former Smoker    Years: 8.00    Last attempt to quit: 06/24/1971    Years since quitting: 45.8  . Smokeless tobacco: Never Used  Substance Use Topics  . Alcohol use: No  . Drug use: No    Family History  Problem Relation Age of Onset  . Heart disease Mother   . Hypertension Mother   . Hyperlipidemia Mother   . Diabetes Mother   . Skin cancer Mother        skin  . Heart  disease Father   . Hypertension Father   . Hyperlipidemia Father   . Diabetes Father   . Colon cancer Neg Hx     Allergies  Allergen Reactions  . Other Hives    Intolerance to strong pain medications  Rash to opsite and tegaderm tape.  . Ticlopidine Hcl Hives and Itching  . Black Pepper [[place]] Palpitations    HEART PALPITATIONS HEART PALPITATIONS  . Codeine Palpitations and Other (See Comments)    Wild dreams, palpitations "wild dreams"    Medication list has been reviewed and updated.  Current Outpatient Medications on File Prior to Visit  Medication Sig Dispense Refill  . acetaminophen (TYLENOL) 325 MG tablet Take 325 mg by mouth every 6 (six) hours as needed for mild pain.    Marland Kitchen allopurinol (ZYLOPRIM) 300 MG tablet Take 1 tablet (300 mg total) by mouth daily. 90 tablet 3  . amLODipine (NORVASC) 5 MG tablet Take 1 tablet (5 mg total) by mouth daily. 90 tablet 3  . aspirin EC 81 MG tablet Take 81 mg by mouth daily.    Marland Kitchen atorvastatin (LIPITOR) 80 MG tablet Take 1 tablet (80 mg total) by mouth daily. 90 tablet 3  . clopidogrel (PLAVIX) 75 MG tablet TAKE 1 TABLET EVERY DAY 90 tablet 3  . ezetimibe (ZETIA) 10 MG tablet Take 1 tablet (10 mg total) by mouth daily. 90 tablet 0  . glipiZIDE (GLUCOTROL XL) 2.5 MG 24 hr tablet Take 1 tablet (2.5 mg total) by mouth daily with breakfast. 90 tablet 3  . isosorbide mononitrate (IMDUR) 60 MG 24 hr tablet Take 1 tablet (60 mg total) by mouth daily. 90 tablet 2  . Loperamide HCl (IMODIUM PO) Take 1 tablet by mouth daily as needed (loosse stools).    . loratadine (CLARITIN) 10 MG tablet Take 10 mg by mouth daily as needed for allergies.    . metoprolol succinate (TOPROL-XL) 25 MG 24 hr tablet Take 1 tablet (25 mg total) by mouth 2 (two) times daily. 180 tablet 2  . MULTIPLE VITAMIN-FOLIC ACID PO Take 1 tablet by mouth daily. Contains 960 mg folic acid    . nitroGLYCERIN (NITROSTAT) 0.4 MG SL tablet Place 1 tablet (0.4 mg total) under the tongue  every 5 (five) minutes as needed for chest pain. *  Please call and schedule 1 yr appt* 25 tablet 0  . Omega-3 Fatty Acids (FISH OIL) 1200 MG CAPS Take 1 capsule by mouth every evening.     Marland Kitchen omeprazole (PRILOSEC) 20 MG capsule Take 1 capsule (20 mg total) by mouth 2 (two) times daily. 180 capsule 3  . potassium chloride SA (K-DUR,KLOR-CON) 20 MEQ tablet Take 1 tablet (20 mEq total) by mouth daily. (Patient taking differently: Take 20 mEq by mouth every evening. ) 90 tablet 3  . triamcinolone cream (KENALOG) 0.1 % Apply 1 application topically 2 (two) times daily. Use as needed for dry or itchy skin 30 g 2  . valsartan-hydrochlorothiazide (DIOVAN-HCT) 160-25 MG tablet Take 1 tablet by mouth daily. 90 tablet 2   No current facility-administered medications on file prior to visit.     Review of Systems:  As per HPI- otherwise negative.   Physical Examination: There were no vitals filed for this visit. There were no vitals filed for this visit. There is no height or weight on file to calculate BMI. Ideal Body Weight:    GEN: WDWN, NAD, Non-toxic, A & O x 3 HEENT: Atraumatic, Normocephalic. Neck supple. No masses, No LAD. Ears and Nose: No external deformity. CV: RRR, No M/G/R. No JVD. No thrill. No extra heart sounds. PULM: CTA B, no wheezes, crackles, rhonchi. No retractions. No resp. distress. No accessory muscle use. ABD: S, NT, ND, +BS. No rebound. No HSM. EXTR: No c/c/e NEURO Normal gait.  PSYCH: Normally interactive. Conversant. Not depressed or anxious appearing.  Calm demeanor.    Assessment and Plan: ***  Signed Lamar Blinks, MD

## 2017-05-04 ENCOUNTER — Ambulatory Visit: Payer: Medicare HMO | Admitting: Family Medicine

## 2017-05-10 NOTE — Progress Notes (Addendum)
[place] at [facility] [address] [place], [place] [address] [phone]  Date:  05/11/2017   Name:  Brandon Stone   DOB:  01-12-1946   MRN:  [id number]  PCP:  Darreld Mclean, MD    Chief Complaint: Follow-up (Pt here for 3-4 month f/u visit. )   History of Present Illness:  Brandon Stone is a 71 y.o. very pleasant male patient who presents with the following:  Here today for a DM check History of hyperlipidemia, HTN, DM, CAD, CLL under observation I last saw him in August, HPI as follows:  He has a "mild leukemia" dx a couple of years ago. His oncologist is local but he cannot remember their name, I do not see these records in my system.   For the time being they are observing his disease He sees Dr. Harrington Challenger for cardiology- he has had CABG twice over his life, in 1991 and 2004. He finds that more recently his angina is better since he is exercising more Dr. Harrington Challenger is managing his lipids with lipitor for now He has had mild diabetes/ borderline DM for several years, but he was just formally dx with DM a couple of years ago. Is on glucotrol for this   He had his gallbladder removed a few months ago.  He does have to watch his diet some, he is feeling well and does not have any RUQ pain any longer  He had 2 children with his first wife, and 2 grands, 2 great grands  He and his second wife married about 6 years ago  His father and mother both had lots of heart issues.  Both are deceased.   Pt did have rheumatic fever as a child of 61 yo He is taking plavix and baby aspirin- no nosebleeds or blood in his stool Most recent A1c was in June 6.6%  A1c due today Flu shot: do today Hep C screening due Needs prevnar 13 Colon: UTD Eyes exam: UTD  He is no longer seeing oncology-  We are following his white blood cell count for him He notes that he had a likely low glucose epidose a couple of weeks ago. He felt dizzy and like he  might faint. He ate a snack and felt better. He did not have time to check his glucose at the time  This has not happened again.  He ate just cereal that am and felt like it di not stick with him Otherwise he has not had any issues whit his glucose being too high or too low   Patient Active Problem List   Diagnosis Date Noted  . Symptomatic cholelithiasis 09/18/2016  . Chronic lymphocytic leukemia (CLL), B-cell ([place]) 05/13/2014  . Stable angina ([place]) 01/26/2012  . Sore throat 11/03/2010  . Hyperlipidemia 06/21/2008  . HYPERTENSION, BENIGN 06/21/2008  . CAD, NATIVE VESSEL 06/21/2008    Past Medical History:  Diagnosis Date  . Anginal pain ([place])   . Arthritis   . Barrett's esophagus   . BPH (benign prostatic hypertrophy)   . CAD (coronary artery disease)     a. CABG 1991;  b.  [place];  c.  S/P PTCA  stents to SVG ot OM last in 2003;  d.  Redo CABG x 2 in 2004 (VG->OM->LCX, LRA->PDA);  e. 12/2011 Cath: 3vd, sev LM into LCX dzs, VG->OM 100, LIMA->DIAG->LAD patent, Rad Art->PDA patent, EF 45%, Med Rx.  . Diverticulosis   .  DM (diabetes mellitus) ([place])   . Dyslipidemia   . Dyspnea   . Gallstones   . GERD (gastroesophageal reflux disease)   . Gout   . History of hiatal hernia   . HTN (hypertension)   . Hyperlipidemia   . Leukemia in remission ([place])   . Psoriasis   . Rheumatic fever    as a child  . S/P coronary artery stent placement 1998  . TIA (transient ischemic attack)    FOUND ON MRI  PER PATIENT-     NO PROBLEM    Past Surgical History:  Procedure Laterality Date  . APPENDECTOMY    . COLONOSCOPY  07/2013  . CORONARY ANGIOPLASTY     STENTS  . CORONARY ARTERY BYPASS GRAFT     1991 (LIMA to LAD/Diag;  SVG to RCA;  SVG to OM)  . CORONARY ARTERY BYPASS GRAFT     2004 (L radial to PDA; SVG to OM1/ distal LCx)  . LAPAROSCOPIC CHOLECYSTECTOMY WITH INTRAOPERATIVE CHOLANGIOGRAM N/A 09/18/2016   Performed by Jackolyn Confer, MD at [facility]  . TONSILLECTOMY AND ADENOIDECTOMY     . UMBILICAL HERNIA REPAIR      Social History   Tobacco Use  . Smoking status: Former Smoker    Years: 8.00    Last attempt to quit: 06/24/1971    Years since quitting: 45.9  . Smokeless tobacco: Never Used  Substance Use Topics  . Alcohol use: No  . Drug use: No    Family History  Problem Relation Age of Onset  . Heart disease Mother   . Hypertension Mother   . Hyperlipidemia Mother   . Diabetes Mother   . Skin cancer Mother        skin  . Heart disease Father   . Hypertension Father   . Hyperlipidemia Father   . Diabetes Father   . Colon cancer Neg Hx     Allergies  Allergen Reactions  . Other Hives    Intolerance to strong pain medications  Rash to opsite and tegaderm tape.  . Ticlopidine Hcl Hives and Itching  . Black Pepper [[place]] Palpitations    HEART PALPITATIONS HEART PALPITATIONS  . Codeine Palpitations and Other (See Comments)    Wild dreams, palpitations "wild dreams"    Medication list has been reviewed and updated.  Current Outpatient Medications on File Prior to Visit  Medication Sig Dispense Refill  . acetaminophen (TYLENOL) 325 MG tablet Take 325 mg by mouth every 6 (six) hours as needed for mild pain.    Marland Kitchen allopurinol (ZYLOPRIM) 300 MG tablet Take 1 tablet (300 mg total) by mouth daily. 90 tablet 3  . amLODipine (NORVASC) 5 MG tablet Take 1 tablet (5 mg total) by mouth daily. 90 tablet 3  . aspirin EC 81 MG tablet Take 81 mg by mouth daily.    Marland Kitchen atorvastatin (LIPITOR) 80 MG tablet Take 1 tablet (80 mg total) by mouth daily. 90 tablet 3  . clopidogrel (PLAVIX) 75 MG tablet TAKE 1 TABLET EVERY DAY 90 tablet 3  . ezetimibe (ZETIA) 10 MG tablet Take 1 tablet (10 mg total) by mouth daily. 90 tablet 0  . glipiZIDE (GLUCOTROL XL) 2.5 MG 24 hr tablet Take 1 tablet (2.5 mg total) by mouth daily with breakfast. 90 tablet 3  . isosorbide mononitrate (IMDUR) 60 MG 24 hr tablet Take 1 tablet (60 mg total) by mouth daily. 90 tablet 2  . Loperamide HCl  (IMODIUM PO) Take 1 tablet by mouth  daily as needed (loosse stools).    . loratadine (CLARITIN) 10 MG tablet Take 10 mg by mouth daily as needed for allergies.    . metoprolol succinate (TOPROL-XL) 25 MG 24 hr tablet Take 1 tablet (25 mg total) by mouth 2 (two) times daily. 180 tablet 2  . MULTIPLE VITAMIN-FOLIC ACID PO Take 1 tablet by mouth daily. Contains 619 mg folic acid    . nitroGLYCERIN (NITROSTAT) 0.4 MG SL tablet Place 1 tablet (0.4 mg total) under the tongue every 5 (five) minutes as needed for chest pain. *Please call and schedule 1 yr appt* 25 tablet 0  . Omega-3 Fatty Acids (FISH OIL) 1200 MG CAPS Take 1 capsule by mouth every evening.     Marland Kitchen omeprazole (PRILOSEC) 20 MG capsule Take 1 capsule (20 mg total) by mouth 2 (two) times daily. 180 capsule 3  . potassium chloride SA (K-DUR,KLOR-CON) 20 MEQ tablet Take 1 tablet (20 mEq total) by mouth daily. (Patient taking differently: Take 20 mEq by mouth every evening. ) 90 tablet 3  . triamcinolone cream (KENALOG) 0.1 % Apply 1 application topically 2 (two) times daily. Use as needed for dry or itchy skin 30 g 2  . valsartan-hydrochlorothiazide (DIOVAN-HCT) 160-25 MG tablet Take 1 tablet by mouth daily. 90 tablet 2   No current facility-administered medications on file prior to visit.     Review of Systems:  As per HPI- otherwise negative. No fever or chills No CP or SOB No nausea, vomiting or diarrhea  No rash or ST  Physical Examination: Vitals:   05/11/17 1000  BP: 114/68  Pulse: 64  Temp: 98 F (36.7 C)  SpO2: 98%   Vitals:   05/11/17 1000  Weight: 223 lb 6.4 oz (101.3 kg)  Height: 5\' 11"  (1.803 m)   Body mass index is 31.16 kg/m. Ideal Body Weight: Weight in (lb) to have BMI = 25: 178.9  GEN: WDWN, NAD, Non-toxic, A & O x 3, obese, looks well HEENT: Atraumatic, Normocephalic. Neck supple. No masses, No LAD.  Bilateral TM wnl, oropharynx normal.  PEERL,EOMI.   Ears and Nose: No external deformity. CV: RRR, No  M/G/R. No JVD. No thrill. No extra heart sounds. PULM: CTA B, no wheezes, crackles, rhonchi. No retractions. No resp. distress. No accessory muscle use. ABD: S, NT, ND, +BS. No rebound. No HSM. EXTR: No c/c/e NEURO Normal gait.  PSYCH: Normally interactive. Conversant. Not depressed or anxious appearing.  Calm demeanor.    Assessment and Plan: Type 2 diabetes mellitus with diabetic neuropathy, without long-term current use of insulin ([place]) - Plan: Comprehensive metabolic panel, Hemoglobin A1c, Pneumococcal conjugate vaccine 13-valent IM, Basic metabolic panel, CANCELED: Basic metabolic panel  Essential hypertension  Coronary artery disease involving native coronary artery of native heart without angina pectoris  Encounter for hepatitis C screening test for low risk patient - Plan: Hepatitis C antibody  History of leukemia - Plan: CBC with Differential/Platelet  Immunization due - Plan: Flu vaccine HIGH DOSE PF (Fluzone High dose)  Follow-up visit today prevnar and flu shots today Labs pending as above' Hep C screening Will plan further follow- up pending labs.  You got your flu shot and 2nd pneumonia shot (prevnar 58) today You may have the Shinrix shingles vaccine series at your convenience at your drug store I'll be in touch with your labs asap- if your A1c is under 6.5 we may need to decrease your diabetes medication. If you have any symptoms of low blood sugar again  do try and check your glucose so we can see what it is   Signed Lamar Blinks, MD  Received his labs  Results for orders placed or performed in visit on 05/11/17  Comprehensive metabolic panel  Result Value Ref Range   Sodium 141 135 - 145 mEq/L   Potassium 3.5 3.5 - 5.1 mEq/L   Chloride 103 96 - 112 mEq/L   CO2 31 19 - 32 mEq/L   Glucose, Bld 192 (H) 70 - 99 mg/dL   BUN 21 6 - 23 mg/dL   Creatinine, Ser 1.06 0.40 - 1.50 mg/dL   Total Bilirubin 1.2 0.2 - 1.2 mg/dL   Alkaline Phosphatase 70 39 - 117  U/L   AST 20 0 - 37 U/L   ALT 22 0 - 53 U/L   Total Protein 6.7 6.0 - 8.3 g/dL   Albumin 4.3 3.5 - 5.2 g/dL   Calcium 9.8 8.4 - 10.5 mg/dL   GFR 73.63 >60.00 mL/min  Hemoglobin A1c  Result Value Ref Range   Hgb A1c MFr Bld 7.6 (H) 4.6 - 6.5 %  Hepatitis C antibody  Result Value Ref Range   Hepatitis C Ab NON-REACTIVE NON-REACTI   SIGNAL TO CUT-OFF 0.02 <1.00  CBC with Differential/Platelet  Result Value Ref Range   WBC 11.9 (H) 4.0 - 10.5 K/uL   RBC 4.87 4.22 - 5.81 Mil/uL   Hemoglobin 15.5 13.0 - 17.0 g/dL   HCT 45.6 39.0 - 52.0 %   MCV 93.7 78.0 - 100.0 fl   MCHC 33.9 30.0 - 36.0 g/dL   RDW 13.5 11.5 - 15.5 %   Platelets 226.0 150.0 - 400.0 K/uL   Neutrophils Relative % 38.4 (L) 43.0 - 77.0 %   Lymphocytes Relative 54.5 (H) 12.0 - 46.0 %   Monocytes Relative 6.0 3.0 - 12.0 %   Eosinophils Relative 0.8 0.0 - 5.0 %   Basophils Relative 0.3 0.0 - 3.0 %   Neutro Abs 4.6 1.4 - 7.7 K/uL   Lymphs Abs 6.5 (H) 0.7 - 4.0 K/uL   Monocytes Absolute 0.7 0.1 - 1.0 K/uL   Eosinophils Absolute 0.1 0.0 - 0.7 K/uL   Basophils Absolute 0.0 0.0 - 0.1 K/uL

## 2017-05-11 ENCOUNTER — Encounter: Payer: Self-pay | Admitting: Family Medicine

## 2017-05-11 ENCOUNTER — Ambulatory Visit: Payer: Medicare HMO | Admitting: Family Medicine

## 2017-05-11 VITALS — BP 114/68 | HR 64 | Temp 98.0°F | Ht 71.0 in | Wt 223.4 lb

## 2017-05-11 DIAGNOSIS — I2510 Atherosclerotic heart disease of native coronary artery without angina pectoris: Secondary | ICD-10-CM

## 2017-05-11 DIAGNOSIS — Z1159 Encounter for screening for other viral diseases: Secondary | ICD-10-CM

## 2017-05-11 DIAGNOSIS — I10 Essential (primary) hypertension: Secondary | ICD-10-CM | POA: Diagnosis not present

## 2017-05-11 DIAGNOSIS — E1140 Type 2 diabetes mellitus with diabetic neuropathy, unspecified: Secondary | ICD-10-CM

## 2017-05-11 DIAGNOSIS — Z23 Encounter for immunization: Secondary | ICD-10-CM | POA: Diagnosis not present

## 2017-05-11 DIAGNOSIS — Z856 Personal history of leukemia: Secondary | ICD-10-CM | POA: Diagnosis not present

## 2017-05-11 LAB — CBC WITH DIFFERENTIAL/PLATELET
BASOS PCT: 0.3 % (ref 0.0–3.0)
Basophils Absolute: 0 10*3/uL (ref 0.0–0.1)
EOS PCT: 0.8 % (ref 0.0–5.0)
Eosinophils Absolute: 0.1 10*3/uL (ref 0.0–0.7)
HEMATOCRIT: 45.6 % (ref 39.0–52.0)
HEMOGLOBIN: 15.5 g/dL (ref 13.0–17.0)
LYMPHS PCT: 54.5 % — AB (ref 12.0–46.0)
Lymphs Abs: 6.5 10*3/uL — ABNORMAL HIGH (ref 0.7–4.0)
MCHC: 33.9 g/dL (ref 30.0–36.0)
MCV: 93.7 fl (ref 78.0–100.0)
MONOS PCT: 6 % (ref 3.0–12.0)
Monocytes Absolute: 0.7 10*3/uL (ref 0.1–1.0)
Neutro Abs: 4.6 10*3/uL (ref 1.4–7.7)
Neutrophils Relative %: 38.4 % — ABNORMAL LOW (ref 43.0–77.0)
Platelets: 226 10*3/uL (ref 150.0–400.0)
RBC: 4.87 Mil/uL (ref 4.22–5.81)
RDW: 13.5 % (ref 11.5–15.5)
WBC: 11.9 10*3/uL — AB (ref 4.0–10.5)

## 2017-05-11 LAB — COMPREHENSIVE METABOLIC PANEL
ALT: 22 U/L (ref 0–53)
AST: 20 U/L (ref 0–37)
Albumin: 4.3 g/dL (ref 3.5–5.2)
Alkaline Phosphatase: 70 U/L (ref 39–117)
BUN: 21 mg/dL (ref 6–23)
CHLORIDE: 103 meq/L (ref 96–112)
CO2: 31 mEq/L (ref 19–32)
Calcium: 9.8 mg/dL (ref 8.4–10.5)
Creatinine, Ser: 1.06 mg/dL (ref 0.40–1.50)
GFR: 73.63 mL/min (ref >60.00)
GLUCOSE: 192 mg/dL — AB (ref 70–99)
POTASSIUM: 3.5 meq/L (ref 3.5–5.1)
SODIUM: 141 meq/L (ref 135–145)
Total Bilirubin: 1.2 mg/dL (ref 0.2–1.2)
Total Protein: 6.7 g/dL (ref 6.0–8.3)

## 2017-05-11 LAB — HEMOGLOBIN A1C: Hgb A1c MFr Bld: 7.6 % — ABNORMAL HIGH (ref 4.6–6.5)

## 2017-05-11 LAB — HEPATITIS C ANTIBODY
Hepatitis C Ab: NONREACTIVE
SIGNAL TO CUT-OFF: 0.02 (ref <1.00)

## 2017-05-11 NOTE — Patient Instructions (Signed)
You got your flu shot and 2nd pneumonia shot (prevnar 16) today You may have the Shinrix shingles vaccine series at your convenience at your drug store I'll be in touch with your labs asap- if your A1c is under 6.5 we may need to decrease your diabetes medication. If you have any symptoms of low blood sugar again do try and check your glucose so we can see what it is

## 2017-05-12 ENCOUNTER — Encounter: Payer: Self-pay | Admitting: Family Medicine

## 2017-05-12 DIAGNOSIS — D72829 Elevated white blood cell count, unspecified: Secondary | ICD-10-CM

## 2017-05-19 ENCOUNTER — Telehealth: Payer: Self-pay | Admitting: Hematology & Oncology

## 2017-05-19 NOTE — Telephone Encounter (Signed)
lvm to inform of New Patient appt 06/11/17 @ 1030 am. Letter mailed 11/27

## 2017-06-11 ENCOUNTER — Other Ambulatory Visit (HOSPITAL_BASED_OUTPATIENT_CLINIC_OR_DEPARTMENT_OTHER): Payer: Medicare HMO

## 2017-06-11 ENCOUNTER — Encounter: Payer: Self-pay | Admitting: Hematology & Oncology

## 2017-06-11 ENCOUNTER — Ambulatory Visit (HOSPITAL_BASED_OUTPATIENT_CLINIC_OR_DEPARTMENT_OTHER): Payer: Medicare HMO | Admitting: Hematology & Oncology

## 2017-06-11 ENCOUNTER — Other Ambulatory Visit: Payer: Self-pay

## 2017-06-11 ENCOUNTER — Other Ambulatory Visit (HOSPITAL_COMMUNITY)
Admission: RE | Admit: 2017-06-11 | Discharge: 2017-06-11 | Disposition: A | Discharge status: Home / Self Care | Payer: Medicare HMO | Source: Ambulatory Visit | Attending: Hematology & Oncology | Admitting: Hematology & Oncology

## 2017-06-11 VITALS — BP 130/73 | HR 69 | Temp 97.9°F | Resp 16 | Wt 225.0 lb

## 2017-06-11 DIAGNOSIS — C9110 Chronic lymphocytic leukemia of B-cell type not having achieved remission: Secondary | ICD-10-CM

## 2017-06-11 DIAGNOSIS — Z87891 Personal history of nicotine dependence: Secondary | ICD-10-CM | POA: Diagnosis not present

## 2017-06-11 DIAGNOSIS — D508 Other iron deficiency anemias: Secondary | ICD-10-CM | POA: Diagnosis not present

## 2017-06-11 DIAGNOSIS — I2510 Atherosclerotic heart disease of native coronary artery without angina pectoris: Secondary | ICD-10-CM

## 2017-06-11 DIAGNOSIS — E119 Type 2 diabetes mellitus without complications: Secondary | ICD-10-CM

## 2017-06-11 DIAGNOSIS — D72820 Lymphocytosis (symptomatic): Secondary | ICD-10-CM | POA: Diagnosis not present

## 2017-06-11 LAB — LACTATE DEHYDROGENASE: LDH: 171 U/L (ref 125–245)

## 2017-06-11 LAB — CMP (CANCER CENTER ONLY)
ALBUMIN: 3.7 g/dL (ref 3.3–5.5)
ALT(SGPT): 32 U/L (ref 10–47)
AST: 28 U/L (ref 11–38)
Alkaline Phosphatase: 83 U/L (ref 26–84)
BUN, Bld: 17 mg/dL (ref 7–22)
CALCIUM: 9.7 mg/dL (ref 8.0–10.3)
CHLORIDE: 101 meq/L (ref 98–108)
CO2: 28 mEq/L (ref 18–33)
CREATININE: 1.1 mg/dL (ref 0.6–1.2)
Glucose, Bld: 246 mg/dL — ABNORMAL HIGH (ref 73–118)
POTASSIUM: 3.2 meq/L — AB (ref 3.3–4.7)
Sodium: 143 mEq/L (ref 128–145)
TOTAL PROTEIN: 6.7 g/dL (ref 6.4–8.1)
Total Bilirubin: 1.6 mg/dl (ref 0.20–1.60)

## 2017-06-11 LAB — IRON AND TIBC
%SAT: 40 % (ref 20–55)
IRON: 142 ug/dL (ref 42–163)
TIBC: 351 ug/dL (ref 202–409)
UIBC: 210 ug/dL (ref 117–376)

## 2017-06-11 LAB — CBC WITH DIFFERENTIAL (CANCER CENTER ONLY)
BASO#: 0 10*3/uL (ref 0.0–0.2)
BASO%: 0.2 % (ref 0.0–2.0)
EOS ABS: 0.1 10*3/uL (ref 0.0–0.5)
EOS%: 0.9 % (ref 0.0–7.0)
HEMATOCRIT: 45.1 % (ref 38.7–49.9)
HEMOGLOBIN: 15.9 g/dL (ref 13.0–17.1)
LYMPH#: 5.5 10*3/uL — AB (ref 0.9–3.3)
LYMPH%: 55.8 % — AB (ref 14.0–48.0)
MCH: 31.9 pg (ref 28.0–33.4)
MCHC: 35.3 g/dL (ref 32.0–35.9)
MCV: 90 fL (ref 82–98)
MONO#: 0.6 10*3/uL (ref 0.1–0.9)
MONO%: 5.8 % (ref 0.0–13.0)
NEUT%: 37.3 % — ABNORMAL LOW (ref 40.0–80.0)
NEUTROS ABS: 3.7 10*3/uL (ref 1.5–6.5)
PLATELETS: 208 10*3/uL (ref 145–400)
RBC: 4.99 10*6/uL (ref 4.20–5.70)
RDW: 12.8 % (ref 11.1–15.7)
WBC: 9.9 10*3/uL (ref 4.0–10.0)

## 2017-06-11 LAB — PROTIME-INR (CHCC SATELLITE)
INR: 1 — AB (ref 2.0–3.5)
Protime: 12 Seconds (ref 10.6–13.4)

## 2017-06-11 LAB — FERRITIN: Ferritin: 93 ng/ml (ref 22–316)

## 2017-06-11 LAB — CHCC SATELLITE - SMEAR

## 2017-06-11 NOTE — Progress Notes (Signed)
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 is a very nice 71 year old white male.  He actually looks a lot younger.  He has history of coronary artery disease.  He has had 2 separate surgeries.  He has a history of diabetes.  Apparently, he was found to have some lymphocytosis a few years ago.  He was referred to hematology.  He cannot remember who he saw.  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  He sees Dr. Lorelei Pont for primary care.  She kindly referred him to the [facility] for monitoring.  He has not noted any palpable lymph nodes.  He has had no weight loss or weight gain.  He has had no fever.  Has had no recurrent infections.  He has had no rashes.  He has had no change in bowel or bladder habits.  He only smoked for about 8 years.  He stopped in his 84s.  He does not drink.  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  He has had no visual issues.  He had no mouth sores.  Overall, I would say that his performance status is ECOG 0.   Past Medical History:  Diagnosis Date  . Anginal pain ([place])   . Arthritis   . Barrett's esophagus   . BPH (benign prostatic hypertrophy)   . CAD (coronary artery disease)     a. CABG 1991;  b.  [place];  c.  S/P PTCA  stents to SVG ot OM last in 2003;  d.  Redo CABG x 2 in 2004 (VG->OM->LCX, LRA->PDA);  e. 12/2011 Cath: 3vd, sev LM into LCX dzs, VG->OM 100, LIMA->DIAG->LAD patent, Rad Art->PDA patent, EF 45%, Med Rx.  . Diverticulosis   . DM (diabetes mellitus) ([place])   . Dyslipidemia   . Dyspnea   . Gallstones   . GERD (gastroesophageal reflux disease)   . Gout   . History of hiatal hernia   . HTN (hypertension)   . Hyperlipidemia   . Leukemia in remission ([place])   . Psoriasis   . Rheumatic fever    as a child  . S/P coronary  artery stent placement 1998  . TIA (transient ischemic attack)    FOUND ON MRI  PER PATIENT-     NO PROBLEM  :  Past Surgical History:  Procedure Laterality Date  . APPENDECTOMY    . CHOLECYSTECTOMY N/A 09/18/2016   Procedure: LAPAROSCOPIC CHOLECYSTECTOMY WITH INTRAOPERATIVE CHOLANGIOGRAM;  Surgeon: Jackolyn Confer, MD;  Location: [facility];  Service: General;  Laterality: N/A;  . COLONOSCOPY  07/2013  . CORONARY ANGIOPLASTY     STENTS  . CORONARY ARTERY BYPASS GRAFT     1991 (LIMA to LAD/Diag;  SVG to RCA;  SVG to OM)  . CORONARY ARTERY BYPASS GRAFT     2004 (L radial to PDA; SVG to OM1/ distal LCx)  . TONSILLECTOMY AND ADENOIDECTOMY    . UMBILICAL HERNIA REPAIR    :   Current Outpatient Medications:  .  acetaminophen (TYLENOL) 325 MG tablet, Take 325 mg by mouth every 6 (six) hours as needed for mild pain., Disp: , Rfl:  .  allopurinol (ZYLOPRIM) 300 MG tablet, Take 1 tablet (300 mg total) by mouth daily., Disp:  90 tablet, Rfl: 3 .  amLODipine (NORVASC) 5 MG tablet, Take 1 tablet (5 mg total) by mouth daily., Disp: 90 tablet, Rfl: 3 .  aspirin EC 81 MG tablet, Take 81 mg by mouth daily., Disp: , Rfl:  .  atorvastatin (LIPITOR) 80 MG tablet, Take 1 tablet (80 mg total) by mouth daily., Disp: 90 tablet, Rfl: 3 .  clopidogrel (PLAVIX) 75 MG tablet, TAKE 1 TABLET EVERY DAY, Disp: 90 tablet, Rfl: 3 .  ezetimibe (ZETIA) 10 MG tablet, Take 1 tablet (10 mg total) by mouth daily., Disp: 90 tablet, Rfl: 0 .  glipiZIDE (GLUCOTROL XL) 2.5 MG 24 hr tablet, Take 1 tablet (2.5 mg total) by mouth daily with breakfast., Disp: 90 tablet, Rfl: 3 .  isosorbide mononitrate (IMDUR) 60 MG 24 hr tablet, Take 1 tablet (60 mg total) by mouth daily., Disp: 90 tablet, Rfl: 2 .  Loperamide HCl (IMODIUM PO), Take 1 tablet by mouth daily as needed (loosse stools)., Disp: , Rfl:  .  loratadine (CLARITIN) 10 MG tablet, Take 10 mg by mouth daily as needed for allergies., Disp: , Rfl:  .  metoprolol succinate  (TOPROL-XL) 25 MG 24 hr tablet, Take 1 tablet (25 mg total) by mouth 2 (two) times daily., Disp: 180 tablet, Rfl: 2 .  MULTIPLE VITAMIN-FOLIC ACID PO, Take 1 tablet by mouth daily. Contains 765 mg folic acid, Disp: , Rfl:  .  Multiple Vitamins-Minerals (MULTIVITAMIN ADULTS PO), , Disp: , Rfl:  .  nitroGLYCERIN (NITROSTAT) 0.4 MG SL tablet, Place 1 tablet (0.4 mg total) under the tongue every 5 (five) minutes as needed for chest pain. *Please call and schedule 1 yr appt*, Disp: 25 tablet, Rfl: 0 .  Omega-3 Fatty Acids (FISH OIL) 1200 MG CAPS, Take 1 capsule by mouth every evening. , Disp: , Rfl:  .  omeprazole (PRILOSEC) 20 MG capsule, Take 1 capsule (20 mg total) by mouth 2 (two) times daily., Disp: 180 capsule, Rfl: 3 .  potassium chloride SA (K-DUR,KLOR-CON) 20 MEQ tablet, Take 1 tablet (20 mEq total) by mouth daily. (Patient taking differently: Take 20 mEq by mouth every evening. ), Disp: 90 tablet, Rfl: 3 .  TOLNAFTATE ANTIFUNGAL EX, , Disp: , Rfl:  .  triamcinolone cream (KENALOG) 0.1 %, Apply 1 application topically 2 (two) times daily. Use as needed for dry or itchy skin, Disp: 30 g, Rfl: 2 .  valsartan-hydrochlorothiazide (DIOVAN-HCT) 160-25 MG tablet, Take 1 tablet by mouth daily., Disp: 90 tablet, Rfl: 2:  :  Allergies  Allergen Reactions  . Other Hives    Intolerance to strong pain medications  Rash to opsite and tegaderm tape.  . Ticlopidine Hcl Hives and Itching  . Black Pepper [[place]] Palpitations    HEART PALPITATIONS HEART PALPITATIONS  . Codeine Palpitations and Other (See Comments)    Wild dreams, palpitations "wild dreams"  :  Family History  Problem Relation Age of Onset  . Heart disease Mother   . Hypertension Mother   . Hyperlipidemia Mother   . Diabetes Mother   . Skin cancer Mother        skin  . Heart disease Father   . Hypertension Father   . Hyperlipidemia Father   . Diabetes Father   . Colon cancer Neg Hx   :  Social History   Socioeconomic  History  . Marital status: Married    Spouse name: Not on file  . Number of children: 2  . Years of education: Not on file  .  Highest education level: Not on file  Social Needs  . Financial resource strain: Not on file  . Food insecurity - worry: Not on file  . Food insecurity - inability: Not on file  . Transportation needs - medical: Not on file  . Transportation needs - non-medical: Not on file  Occupational History  . Occupation: retired  Tobacco Use  . Smoking status: Former Smoker    Years: 8.00    Last attempt to quit: 06/24/1971    Years since quitting: 45.9  . Smokeless tobacco: Never Used  Substance and Sexual Activity  . Alcohol use: No  . Drug use: No  . Sexual activity: Not on file  Other Topics Concern  . Not on file  Social History Narrative  . Not on file  :  Pertinent items are noted in HPI.  Exam: Well-developed and well-nourished white male in no obvious distress.  Vital signs show a temperature of 97.9.  Pulse 69.  Blood pressure 130/73.  Weight is 225 pounds.  Head neck exam shows no ocular or oral lesions.  He has no adenopathy in the neck.  There is no palpable thyroid.  Lungs are clear bilaterally.  Cardiac exam regular rate and rhythm with no murmurs, rubs or bruits.  Abdomen is soft.  He is mildly obese.  He has good bowel sounds.  He has no fluid wave.  There is no palpable liver or spleen tip.  There is no inguinal adenopathy bilaterally.  He has no palpable abdominal mass.  Axillary exam shows no bilateral axillary adenopathy.  Extremities shows no clubbing, cyanosis or edema.  Neurological exam shows no focal neurological deficits.  Skin exam shows no rashes, ecchymoses or petechia. @IPVITALS @   Recent Labs    06/11/17 1044  WBC 9.9  HGB 15.9  HCT 45.1  PLT 208   Recent Labs    06/11/17 1044  NA 143  K 3.2*  CL 101  CO2 28  GLUCOSE 246*  BUN 17  CREATININE 1.1  CALCIUM 9.7    Blood smear review: Normochromic and normocytic  population of red blood cells.  He has no nucleated red blood cells.  There are no teardrop cells.  I see no rouleaux formation.  White cells show increase in lymphocytes.  He has a couple smudge cells.  I see no blasts.  There are no hypersegmented polys.  Platelets are adequate in number and size.  Pathology: None    Assessment and Plan: Mr. Prieto is a 71 year old white male.  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  I am sending his peripheral blood off for flow cytometry.  This certainly sounds like CLL.  He has had this for at least 5 years.  He is already seeing a hematologist.  I see nothing on his examination or on his blood smear that would suggest that we have to do any treatment.  As such, I probably classify him as having stage "0" disease.  I talked him at length about CLL.  I explained him that a lot of patients with CLL do not need to be treated.  For him, I think we just watch him.  We can follow along.  I just do not think that we have to worry about him having aggressive disease.  He does not need a bone marrow test.  I spent about 45 minutes with him.  He is very nice.  He served our country.  He was in the [employer].  As such, he is  a true American hero.  I will have him come back in 6 months.

## 2017-06-12 LAB — ERYTHROPOIETIN: ERYTHROPOIETIN: 7.8 m[IU]/mL (ref 2.6–18.5)

## 2017-06-12 LAB — RETICULOCYTES: Reticulocyte Count: 1.5 % (ref 0.6–2.6)

## 2017-06-17 LAB — FLOW CYTOMETRY - CHCC SATELLITE

## 2017-06-22 ENCOUNTER — Other Ambulatory Visit: Payer: Self-pay

## 2017-06-22 ENCOUNTER — Telehealth: Payer: Self-pay | Admitting: Family Medicine

## 2017-06-22 DIAGNOSIS — R197 Diarrhea, unspecified: Secondary | ICD-10-CM

## 2017-06-22 MED ORDER — GLIPIZIDE ER 2.5 MG PO TB24: 2.5000 mg | ORAL_TABLET | Freq: Every day | ORAL | 3 refills | Status: DC

## 2017-06-22 NOTE — Telephone Encounter (Signed)
Copied from [place] [id number]. Topic: Quick Communication - Rx Refill/Question >> Jun 22, 2017  8:30 AM Synthia Innocent wrote: Has the patient contacted their pharmacy? Yes.     (Agent: If no, request that the patient contact the pharmacy for the refill.)   Preferred Pharmacy (with phone number or street name): [facility]    Agent: Please be advised that RX refills may take up to 3 business days. We ask that you follow-up with your pharmacy. Requesting refill on Medication glipiZIDE (GLUCOTROL XL) 2.5 MG 24 hr tablet [[id number]]

## 2017-07-03 ENCOUNTER — Ambulatory Visit: Payer: Medicare HMO | Admitting: Internal Medicine

## 2017-07-03 ENCOUNTER — Encounter: Payer: Self-pay | Admitting: Internal Medicine

## 2017-07-03 VITALS — BP 130/80 | HR 65 | Ht 71.0 in | Wt 229.4 lb

## 2017-07-03 DIAGNOSIS — R0600 Dyspnea, unspecified: Secondary | ICD-10-CM

## 2017-07-03 DIAGNOSIS — I10 Essential (primary) hypertension: Secondary | ICD-10-CM

## 2017-07-03 DIAGNOSIS — E782 Mixed hyperlipidemia: Secondary | ICD-10-CM

## 2017-07-03 DIAGNOSIS — I2510 Atherosclerotic heart disease of native coronary artery without angina pectoris: Secondary | ICD-10-CM | POA: Diagnosis not present

## 2017-07-03 NOTE — Progress Notes (Signed)
Cardiology Office Note   Date:  07/03/2017   ID:  Brandon Stone, DOB [birth year], MRN [id number]  PCP:  Darreld Mclean, MD  Cardiologist:   Dorris Carnes, MD     F/U of CAD    History of Present Illness: Brandon Stone is a 72 y.o. male with a history of CAD (s/p CABG in 1999; [place] in 1998. Redo CABG in 2004 (SVG to OM/LCx; LRA to PDA). Cath in July 2013 due to increased CP showed severe LM disease into Lcx.; SVG to OM 100%; LIMA to Diag and LAD patent; Radial Artery to PDA patent LVEF 45% Plan was to continue medical Rx. I saw the pt in Summer 2018  Complained of dyspnea  I was not convinced cardiac  Felt recovering URI  Echo showed LVEF normal  Mild diastolic dysfunciton.    Pt's breathing is back to baseline  Still gets SOB with talking   Walking from parking lot did fine   With activity he says he does fine  "When I get going I am fine"    Recently seen by P Ennever for elev WBC This was normal  ? Smoldering CLL  Will follow up with him  Current Meds  Medication Sig  . acetaminophen (TYLENOL) 325 MG tablet Take 325 mg by mouth every 6 (six) hours as needed for mild pain.  Marland Kitchen allopurinol (ZYLOPRIM) 300 MG tablet Take 1 tablet (300 mg total) by mouth daily.  Marland Kitchen amLODipine (NORVASC) 5 MG tablet Take 1 tablet (5 mg total) by mouth daily.  Marland Kitchen aspirin EC 81 MG tablet Take 81 mg by mouth daily.  Marland Kitchen atorvastatin (LIPITOR) 80 MG tablet Take 1 tablet (80 mg total) by mouth daily.  . clopidogrel (PLAVIX) 75 MG tablet TAKE 1 TABLET EVERY DAY  . glipiZIDE (GLUCOTROL XL) 2.5 MG 24 hr tablet Take 1 tablet (2.5 mg total) by mouth daily with breakfast.  . isosorbide mononitrate (IMDUR) 60 MG 24 hr tablet Take 1 tablet (60 mg total) by mouth daily.  . Loperamide HCl (IMODIUM PO) Take 1 tablet by mouth daily as needed (loosse stools).  . loratadine (CLARITIN) 10 MG tablet Take 10 mg by mouth daily as needed for allergies.  . metoprolol succinate (TOPROL-XL) 25 MG 24 hr tablet Take 1 tablet (25 mg  total) by mouth 2 (two) times daily.  . MULTIPLE VITAMIN-FOLIC ACID PO Take 1 tablet by mouth daily. Contains 382 mg folic acid  . Multiple Vitamins-Minerals (MULTIVITAMIN ADULTS PO)   . nitroGLYCERIN (NITROSTAT) 0.4 MG SL tablet Place 1 tablet (0.4 mg total) under the tongue every 5 (five) minutes as needed for chest pain. *Please call and schedule 1 yr appt*  . Omega-3 Fatty Acids (FISH OIL) 1200 MG CAPS Take 1 capsule by mouth every evening.   Marland Kitchen omeprazole (PRILOSEC) 20 MG capsule Take 1 capsule (20 mg total) by mouth 2 (two) times daily.  . potassium chloride SA (K-DUR,KLOR-CON) 20 MEQ tablet Take 1 tablet (20 mEq total) by mouth daily. (Patient taking differently: Take 20 mEq by mouth every evening. )  . TOLNAFTATE ANTIFUNGAL EX   . triamcinolone cream (KENALOG) 0.1 % Apply 1 application topically 2 (two) times daily. Use as needed for dry or itchy skin  . valsartan-hydrochlorothiazide (DIOVAN-HCT) 160-25 MG tablet Take 1 tablet by mouth daily.     Allergies:   Other; Ticlopidine hcl; Black pepper [[place]]; and Codeine   Past Medical History:  Diagnosis Date  . Anginal pain ([place])   .  Arthritis   . Barrett's esophagus   . BPH (benign prostatic hypertrophy)   . CAD (coronary artery disease)     a. CABG 1991;  b.  [place];  c.  S/P PTCA  stents to SVG ot OM last in 2003;  d.  Redo CABG x 2 in 2004 (VG->OM->LCX, LRA->PDA);  e. 12/2011 Cath: 3vd, sev LM into LCX dzs, VG->OM 100, LIMA->DIAG->LAD patent, Rad Art->PDA patent, EF 45%, Med Rx.  . Diverticulosis   . DM (diabetes mellitus) ([place])   . Dyslipidemia   . Dyspnea   . Gallstones   . GERD (gastroesophageal reflux disease)   . Gout   . History of hiatal hernia   . HTN (hypertension)   . Hyperlipidemia   . Leukemia in remission ([place])   . Psoriasis   . Rheumatic fever    as a child  . S/P coronary artery stent placement 1998  . TIA (transient ischemic attack)    FOUND ON MRI  PER PATIENT-     NO PROBLEM    Past Surgical  History:  Procedure Laterality Date  . APPENDECTOMY    . CHOLECYSTECTOMY N/A 09/18/2016   Procedure: LAPAROSCOPIC CHOLECYSTECTOMY WITH INTRAOPERATIVE CHOLANGIOGRAM;  Surgeon: Jackolyn Confer, MD;  Location: [facility];  Service: General;  Laterality: N/A;  . COLONOSCOPY  07/2013  . CORONARY ANGIOPLASTY     STENTS  . CORONARY ARTERY BYPASS GRAFT     1991 (LIMA to LAD/Diag;  SVG to RCA;  SVG to OM)  . CORONARY ARTERY BYPASS GRAFT     2004 (L radial to PDA; SVG to OM1/ distal LCx)  . TONSILLECTOMY AND ADENOIDECTOMY    . UMBILICAL HERNIA REPAIR       Social History:  The patient  reports that he quit smoking about 46 years ago. He quit after 8.00 years of use. he has never used smokeless tobacco. He reports that he does not drink alcohol or use drugs.   Family History:  The patient's family history includes Diabetes in his father and mother; Heart disease in his father and mother; Hyperlipidemia in his father and mother; Hypertension in his father and mother; Skin cancer in his mother.    ROS:  Please see the history of present illness. All other systems are reviewed and  Negative to the above problem except as noted.    PHYSICAL EXAM: VS:  BP 130/80   Pulse 65   Ht 5\' 11"  (1.803 m)   Wt 229 lb 6.4 oz (104.1 kg)   SpO2 94%   BMI 31.99 kg/m   GEN: Obese 72 yo, in no acute distress  HEENT: normal  Neck: JVP normal  No carotid bruits, or masses Cardiac: RRR; no murmurs, rubs, or gallops,no edema  Respiratory:  clear to auscultation bilaterally, normal work of breathing GI: soft, nontender, distended, + BS  No hepatomegaly  MS: no deformity Moving all extremities   Skin: warm and dry, no rash Neuro:  Strength and sensation are intact Psych: euthymic mood, full affect   EKG:  EKG is not ordered today.   Lipid Panel    Component Value Date/Time   CHOL 130 04/08/2017 0813   TRIG 173 (H) 04/08/2017 0813   HDL 34 (L) 04/08/2017 0813   CHOLHDL 3.8 04/08/2017 0813   CHOLHDL 4.3  10/29/2015 0849   VLDL 42 (H) 10/29/2015 0849   LDLCALC 61 04/08/2017 0813   LDLDIRECT 119.2 06/27/2013 0849      Wt Readings from Last 3  Encounters:  07/03/17 229 lb 6.4 oz (104.1 kg)  06/11/17 225 lb (102.1 kg)  05/11/17 223 lb 6.4 oz (101.3 kg)      ASSESSMENT AND PLAN:  1  Dyspnea.  Not associated with activity  He says when he gets going he does fine  Follow  2  CAD  I am not convinced episodes reflect angina   3  HL  Keep on lipitor   LDL 61 in October    4.  HTN  Keep on current meds   BP is good    5  Wt   Pt's wt is up  He admits to eating too much and not walking  Will get back to gym  Cut back on carbs     Current medicines are reviewed at length with the patient today.  The patient does not have concerns regarding medicines.  Signed, Dorris Carnes, MD  07/03/2017 8:28 AM    [facility] [place], [place], [place]  [address] Phone: [phone]; Fax: [phone]

## 2017-07-03 NOTE — Patient Instructions (Signed)
Your physician recommends that you continue on your current medications as directed. Please refer to the Current Medication list given to you today. Your physician wants you to follow-up in: 1 year with Dr. Ross.  You will receive a reminder letter in the mail two months in advance. If you don't receive a letter, please call our office to schedule the follow-up appointment.  

## 2017-08-13 ENCOUNTER — Other Ambulatory Visit: Payer: Self-pay | Admitting: Emergency Medicine

## 2017-08-13 DIAGNOSIS — Z8739 Personal history of other diseases of the musculoskeletal system and connective tissue: Secondary | ICD-10-CM

## 2017-08-13 MED ORDER — ALLOPURINOL 300 MG PO TABS
300.0000 mg | ORAL_TABLET | Freq: Every day | ORAL | 3 refills | Status: DC
Start: 2017-08-13 — End: 2018-11-23

## 2017-08-31 ENCOUNTER — Other Ambulatory Visit: Payer: Self-pay | Admitting: Internal Medicine

## 2017-09-04 ENCOUNTER — Other Ambulatory Visit: Payer: Self-pay | Admitting: Emergency Medicine

## 2017-09-04 DIAGNOSIS — K219 Gastro-esophageal reflux disease without esophagitis: Secondary | ICD-10-CM

## 2017-09-04 MED ORDER — OMEPRAZOLE 20 MG PO CPDR: 20.0000 mg | DELAYED_RELEASE_CAPSULE | Freq: Two times a day (BID) | ORAL | 3 refills | Status: DC

## 2017-09-25 NOTE — Progress Notes (Addendum)
Subjective:   Brandon Stone is a 72 y.o. male who presents for an Initial Medicare Annual Wellness Visit.  Review of Systems No ROS.  Medicare Wellness Visit. Additional risk factors are reflected in the social history.  Cardiac Risk Factors include: advanced age (>69men, >31 women);diabetes mellitus;dyslipidemia;hypertension;male gender;obesity (BMI >30kg/m2) Sleep patterns: wakes once to urinate. Sleeps well per pt, Feels rested.  Home Safety/Smoke Alarms: Feels safe in home. Smoke alarms in place.  Living environment; residence and Firearm Safety: Lives in 2 story home with wife and dog. Seat Belt Safety/Bike Helmet: Wears seat belt.  Male:   CCS- due again 2021    PSA-  Lab Results  Component Value Date   PSA 0.82 08/27/2015      Objective:    Today's Vitals   09/28/17 1029  BP: (!) 112/58  Pulse: 65  SpO2: 97%  Weight: 220 lb (99.8 kg)  Height: 5\' 11"  (1.803 m)   Body mass index is 30.68 kg/m.  Advanced Directives 09/28/2017 06/11/2017 09/11/2016 07/21/2016 01/08/2012  Does Patient Have a Medical Advance Directive? No No No No Patient does not have advance directive  Would patient like information on creating a medical advance directive? No - Patient declined - No - Patient declined - -  Pre-existing out of facility DNR order (yellow form or pink MOST form) - - - - No    Current Medications (verified) Outpatient Encounter Medications as of 09/28/2017  Medication Sig  . acetaminophen (TYLENOL) 325 MG tablet Take 325 mg by mouth every 6 (six) hours as needed for mild pain.  Marland Kitchen allopurinol (ZYLOPRIM) 300 MG tablet Take 1 tablet (300 mg total) by mouth daily.  Marland Kitchen amLODipine (NORVASC) 5 MG tablet Take 1 tablet (5 mg total) by mouth daily.  Marland Kitchen aspirin EC 81 MG tablet Take 81 mg by mouth daily.  Marland Kitchen atorvastatin (LIPITOR) 80 MG tablet Take 1 tablet (80 mg total) by mouth daily.  . clopidogrel (PLAVIX) 75 MG tablet TAKE 1 TABLET EVERY DAY  . glipiZIDE (GLUCOTROL XL) 2.5 MG 24 hr  tablet Take 1 tablet (2.5 mg total) by mouth daily with breakfast.  . isosorbide mononitrate (IMDUR) 60 MG 24 hr tablet Take 1 tablet (60 mg total) by mouth daily.  Marland Kitchen loratadine (CLARITIN) 10 MG tablet Take 10 mg by mouth daily as needed for allergies.  . metoprolol succinate (TOPROL-XL) 25 MG 24 hr tablet Take 1 tablet (25 mg total) by mouth 2 (two) times daily.  . MULTIPLE VITAMIN-FOLIC ACID PO Take 1 tablet by mouth daily. Contains 947 mg folic acid  . Multiple Vitamins-Minerals (MULTIVITAMIN ADULTS PO)   . nitroGLYCERIN (NITROSTAT) 0.4 MG SL tablet Place 1 tablet (0.4 mg total) under the tongue every 5 (five) minutes as needed for chest pain.  . Omega-3 Fatty Acids (FISH OIL) 1200 MG CAPS Take 1 capsule by mouth every evening.   Marland Kitchen omeprazole (PRILOSEC) 20 MG capsule Take 1 capsule (20 mg total) by mouth 2 (two) times daily.  . potassium chloride SA (K-DUR,KLOR-CON) 20 MEQ tablet Take 1 tablet (20 mEq total) by mouth daily. (Patient taking differently: Take 20 mEq by mouth every evening. )  . TOLNAFTATE ANTIFUNGAL EX   . triamcinolone cream (KENALOG) 0.1 % Apply 1 application topically 2 (two) times daily. Use as needed for dry or itchy skin  . valsartan-hydrochlorothiazide (DIOVAN-HCT) 160-25 MG tablet Take 1 tablet by mouth daily.  . Loperamide HCl (IMODIUM PO) Take 1 tablet by mouth daily as needed (loosse stools).  No facility-administered encounter medications on file as of 09/28/2017.     Allergies (verified) Other; Ticlopidine hcl; Black pepper [piper]; and Codeine   History: Past Medical History:  Diagnosis Date  . Anginal pain ([place])   . Arthritis   . Barrett's esophagus   . BPH (benign prostatic hypertrophy)   . CAD (coronary artery disease)     a. CABG 1991;  b.  [place];  c.  S/P PTCA  stents to SVG ot OM last in 2003;  d.  Redo CABG x 2 in 2004 (VG->OM->LCX, LRA->PDA);  e. 12/2011 Cath: 3vd, sev LM into LCX dzs, VG->OM 100, LIMA->DIAG->LAD patent, Rad Art->PDA patent, EF  45%, Med Rx.  . Diverticulosis   . DM (diabetes mellitus) ([place])   . Dyslipidemia   . Dyspnea   . Gallstones   . GERD (gastroesophageal reflux disease)   . Gout   . History of hiatal hernia   . HTN (hypertension)   . Hyperlipidemia   . Leukemia in remission ([place])   . Psoriasis   . Rheumatic fever    as a child  . S/P coronary artery stent placement 1998  . TIA (transient ischemic attack)    FOUND ON MRI  PER PATIENT-     NO PROBLEM   Past Surgical History:  Procedure Laterality Date  . APPENDECTOMY    . CHOLECYSTECTOMY N/A 09/18/2016   Procedure: LAPAROSCOPIC CHOLECYSTECTOMY WITH INTRAOPERATIVE CHOLANGIOGRAM;  Surgeon: Jackolyn Confer, MD;  Location: [facility];  Service: General;  Laterality: N/A;  . COLONOSCOPY  07/2013  . CORONARY ANGIOPLASTY     STENTS  . CORONARY ARTERY BYPASS GRAFT     1991 (LIMA to LAD/Diag;  SVG to RCA;  SVG to OM)  . CORONARY ARTERY BYPASS GRAFT     2004 (L radial to PDA; SVG to OM1/ distal LCx)  . TONSILLECTOMY AND ADENOIDECTOMY    . UMBILICAL HERNIA REPAIR     Family History  Problem Relation Age of Onset  . Heart disease Mother   . Hypertension Mother   . Hyperlipidemia Mother   . Diabetes Mother   . Skin cancer Mother        skin  . Heart disease Father   . Hypertension Father   . Hyperlipidemia Father   . Diabetes Father   . Stroke Sister   . Stroke Brother   . Colon cancer Neg Hx    Social History   Socioeconomic History  . Marital status: Married    Spouse name: Not on file  . Number of children: 2  . Years of education: Not on file  . Highest education level: Not on file  Occupational History  . Occupation: retired  Scientific laboratory technician  . Financial resource strain: Not on file  . Food insecurity:    Worry: Not on file    Inability: Not on file  . Transportation needs:    Medical: Not on file    Non-medical: Not on file  Tobacco Use  . Smoking status: Former Smoker    Years: 8.00    Last attempt to quit: 06/24/1971    Years  since quitting: 46.2  . Smokeless tobacco: Never Used  Substance and Sexual Activity  . Alcohol use: No  . Drug use: No  . Sexual activity: Yes  Lifestyle  . Physical activity:    Days per week: Not on file    Minutes per session: Not on file  . Stress: Not on file  Relationships  .  Social connections:    Talks on phone: Not on file    Gets together: Not on file    Attends religious service: Not on file    Active member of club or organization: Not on file    Attends meetings of clubs or organizations: Not on file    Relationship status: Not on file  Other Topics Concern  . Not on file  Social History Narrative  . Not on file   Tobacco Counseling Counseling given: Not Answered   Clinical Intake: Pain : No/denies pain   Activities of Daily Living In your present state of health, do you have any difficulty performing the following activities: 09/28/2017  Hearing? N  Vision? N  Comment wears reading glasses.  Difficulty concentrating or making decisions? N  Walking or climbing stairs? N  Dressing or bathing? N  Doing errands, shopping? N  Preparing Food and eating ? N  Using the Toilet? N  In the past six months, have you accidently leaked urine? N  Do you have problems with loss of bowel control? N  Managing your Medications? N  Managing your Finances? N  Housekeeping or managing your Housekeeping? N  Some recent data might be hidden     Immunizations and Health Maintenance Immunization History  Administered Date(s) Administered  . Influenza, High Dose Seasonal PF 05/07/2015, 05/06/2016, 05/11/2017  . Pneumococcal Conjugate-13 05/11/2017  . Pneumococcal Polysaccharide-23 12/19/2014  . Tdap 12/19/2016   Health Maintenance Due  Topic Date Due  . FOOT EXAM  05/09/2017  . OPHTHALMOLOGY EXAM  07/24/2017    Patient Care Team: Copland, Gay Filler, MD as PCP - General (Family Medicine) Fay Records, MD as Consulting Physician (Cardiology) Magrinat, Virgie Dad, MD  as Consulting Physician (Oncology)  Indicate any recent Medical Services you may have received from other than [facility] providers in the past year (date may be approximate).    Assessment:   This is a routine wellness examination for Brandon Stone. Physical assessment deferred to PCP.'  Hearing/Vision screen  Visual Acuity Screening   Right eye Left eye Both eyes  Without correction: 20/25 20/25 20/25   With correction:     Hearing Screening Comments: Able to hear conversational tones w/o difficulty. No issues reported.    Dietary issues and exercise activities discussed: Current Exercise Habits: Home exercise routine, Time (Minutes): 60, Frequency (Times/Week): 5, Weekly Exercise (Minutes/Week): 300 Diet (meal preparation, eat out, water intake, caffeinated beverages, dairy products, fruits and vegetables): Pt states he is on the keto diet.     Goals    . Weight (lb) < 200 lb (90.7 kg)      Depression Screen PHQ 2/9 Scores 09/28/2017 02/05/2017  PHQ - 2 Score 0 0  Exception Documentation - Patient refusal    Fall Risk Fall Risk  09/28/2017 02/05/2017  Falls in the past year? No No    Cognitive Function: MMSE - Mini Mental State Exam 09/28/2017  Orientation to time 5  Orientation to Place 5  Registration 3  Attention/ Calculation 5  Recall 3  Language- name 2 objects 2  Language- repeat 1  Language- follow 3 step command 3  Language- read & follow direction 1  Write a sentence 1        Screening Tests Health Maintenance  Topic Date Due  . FOOT EXAM  05/09/2017  . OPHTHALMOLOGY EXAM  07/24/2017  . HEMOGLOBIN A1C  11/08/2017  . INFLUENZA VACCINE  01/21/2018  . COLONOSCOPY  07/21/2026  . TETANUS/TDAP  12/20/2026  .  Hepatitis C Screening  Completed  . PNA vac Low Risk Adult  Completed      Plan:   Follow up with PCP as directed.  Continue to eat heart healthy diet (full of fruits, vegetables, whole grains, lean protein, water--limit salt, fat, and sugar intake) and increase  physical activity as tolerated.  Continue doing brain stimulating activities (puzzles, reading, adult coloring books, staying active) to keep memory sharp.     I have personally reviewed and noted the following in the patient's chart:   . Medical and social history . Use of alcohol, tobacco or illicit drugs  . Current medications and supplements . Functional ability and status . Nutritional status . Physical activity . Advanced directives . List of other physicians . Hospitalizations, surgeries, and ER visits in previous 12 months . Vitals . Screenings to include cognitive, depression, and falls . Referrals and appointments  In addition, I have reviewed and discussed with patient certain preventive protocols, quality metrics, and best practice recommendations. A written personalized care plan for preventive services as well as general preventive health recommendations were provided to patient.     Shela Nevin, RN   09/28/2017     I have reviewed the above MWE by Ms. Vevelyn Royals and agree with her documentation Denny Peon MD

## 2017-09-28 ENCOUNTER — Encounter: Payer: Self-pay | Admitting: *Deleted

## 2017-09-28 ENCOUNTER — Ambulatory Visit (INDEPENDENT_AMBULATORY_CARE_PROVIDER_SITE_OTHER): Payer: Medicare HMO | Admitting: *Deleted

## 2017-09-28 VITALS — BP 112/58 | HR 65 | Ht 71.0 in | Wt 220.0 lb

## 2017-09-28 DIAGNOSIS — Z0000 Encounter for general adult medical examination without abnormal findings: Secondary | ICD-10-CM | POA: Diagnosis not present

## 2017-09-28 NOTE — Patient Instructions (Signed)
Continue to eat heart healthy diet (full of fruits, vegetables, whole grains, lean protein, water--limit salt, fat, and sugar intake)   Continue exercising 3-5x/ week.  Continue doing brain stimulating activities (puzzles, reading, adult coloring books, staying active) to keep memory sharp.    Brandon Stone , Thank you for taking time to come for your Medicare Wellness Visit. I appreciate your ongoing commitment to your health goals. Please review the following plan we discussed and let me know if I can assist you in the future.   These are the goals we discussed: Goals    . Weight (lb) < 200 lb (90.7 kg)       This is a list of the screening recommended for you and due dates:  Health Maintenance  Topic Date Due  . Complete foot exam   05/09/2017  . Eye exam for diabetics  07/24/2017  . Hemoglobin A1C  11/08/2017  . Flu Shot  01/21/2018  . Colon Cancer Screening  07/21/2026  . Tetanus Vaccine  12/20/2026  .  Hepatitis C: One time screening is recommended by Center for Disease Control  (CDC) for  adults born from [birth year] through 1963.   Completed  . Pneumonia vaccines  Completed    Health Maintenance, Male A healthy lifestyle and preventive care is important for your health and wellness. Ask your health care provider about what schedule of regular examinations is right for you. What should I know about weight and diet? Eat a Healthy Diet  Eat plenty of vegetables, fruits, whole grains, low-fat dairy products, and lean protein.  Do not eat a lot of foods high in solid fats, added sugars, or salt.  Maintain a Healthy Weight Regular exercise can help you achieve or maintain a healthy weight. You should:  Do at least 150 minutes of exercise each week. The exercise should increase your heart rate and make you sweat (moderate-intensity exercise).  Do strength-training exercises at least twice a week.  Watch Your Levels of Cholesterol and Blood Lipids  Have your blood tested for lipids  and cholesterol every 5 years starting at 70 years of age. If you are at high risk for heart disease, you should start having your blood tested when you are 70 years old. You may need to have your cholesterol levels checked more often if: ? Your lipid or cholesterol levels are high. ? You are older than 70 years of age. ? You are at high risk for heart disease.  What should I know about cancer screening? Many types of cancers can be detected early and may often be prevented. Lung Cancer  You should be screened every year for lung cancer if: ? You are a current smoker who has smoked for at least 30 years. ? You are a former smoker who has quit within the past 15 years.  Talk to your health care provider about your screening options, when you should start screening, and how often you should be screened.  Colorectal Cancer  Routine colorectal cancer screening usually begins at 70 years of age and should be repeated every 5-10 years until you are 70 years old. You may need to be screened more often if early forms of precancerous polyps or small growths are found. Your health care provider may recommend screening at an earlier age if you have risk factors for colon cancer.  Your health care provider may recommend using home test kits to check for hidden blood in the stool.  A small camera at the  end of a tube can be used to examine your colon (sigmoidoscopy or colonoscopy). This checks for the earliest forms of colorectal cancer.  Prostate and Testicular Cancer  Depending on your age and overall health, your health care provider may do certain tests to screen for prostate and testicular cancer.  Talk to your health care provider about any symptoms or concerns you have about testicular or prostate cancer.  Skin Cancer  Check your skin from head to toe regularly.  Tell your health care provider about any new moles or changes in moles, especially if: ? There is a change in a mole's size,  shape, or color. ? You have a mole that is larger than a pencil eraser.  Always use sunscreen. Apply sunscreen liberally and repeat throughout the day.  Protect yourself by wearing long sleeves, pants, a wide-brimmed hat, and sunglasses when outside.  What should I know about heart disease, diabetes, and high blood pressure?  If you are 15-63 years of age, have your blood pressure checked every 3-5 years. If you are 20 years of age or older, have your blood pressure checked every year. You should have your blood pressure measured twice-once when you are at a hospital or clinic, and once when you are not at a hospital or clinic. Record the average of the two measurements. To check your blood pressure when you are not at a hospital or clinic, you can use: ? An automated blood pressure machine at a pharmacy. ? A home blood pressure monitor.  Talk to your health care provider about your target blood pressure.  If you are between 33-68 years old, ask your health care provider if you should take aspirin to prevent heart disease.  Have regular diabetes screenings by checking your fasting blood sugar level. ? If you are at a normal weight and have a low risk for diabetes, have this test once every three years after the age of 39. ? If you are overweight and have a high risk for diabetes, consider being tested at a younger age or more often.  A one-time screening for abdominal aortic aneurysm (AAA) by ultrasound is recommended for men aged 28-75 years who are current or former smokers. What should I know about preventing infection? Hepatitis B If you have a higher risk for hepatitis B, you should be screened for this virus. Talk with your health care provider to find out if you are at risk for hepatitis B infection. Hepatitis C Blood testing is recommended for:  Everyone born from [birth year] through 1963.  Anyone with known risk factors for hepatitis C.  Sexually Transmitted Diseases (STDs)  You  should be screened each year for STDs including gonorrhea and chlamydia if: ? You are sexually active and are younger than 70 years of age. ? You are older than 70 years of age and your health care provider tells you that you are at risk for this type of infection. ? Your sexual activity has changed since you were last screened and you are at an increased risk for chlamydia or gonorrhea. Ask your health care provider if you are at risk.  Talk with your health care provider about whether you are at high risk of being infected with HIV. Your health care provider may recommend a prescription medicine to help prevent HIV infection.  What else can I do?  Schedule regular health, dental, and eye exams.  Stay current with your vaccines (immunizations).  Do not use any tobacco products, such as  cigarettes, chewing tobacco, and e-cigarettes. If you need help quitting, ask your health care provider.  Limit alcohol intake to no more than 2 drinks per day. One drink equals 12 ounces of beer, 5 ounces of wine, or 1 ounces of hard liquor.  Do not use street drugs.  Do not share needles.  Ask your health care provider for help if you need support or information about quitting drugs.  Tell your health care provider if you often feel depressed.  Tell your health care provider if you have ever been abused or do not feel safe at home. This information is not intended to replace advice given to you by your health care provider. Make sure you discuss any questions you have with your health care provider. Document Released: 12/06/2007 Document Revised: 02/06/2016 Document Reviewed: 03/13/2015 Elsevier Interactive Patient Education  [employer].

## 2017-11-18 ENCOUNTER — Other Ambulatory Visit: Payer: Self-pay | Admitting: Internal Medicine

## 2017-11-25 ENCOUNTER — Other Ambulatory Visit: Payer: Self-pay

## 2017-11-25 MED ORDER — POTASSIUM CHLORIDE CRYS ER 20 MEQ PO TBCR: 20.0000 meq | EXTENDED_RELEASE_TABLET | Freq: Every evening | ORAL | 1 refills | Status: DC

## 2017-12-10 ENCOUNTER — Inpatient Hospital Stay: Payer: Medicare HMO | Attending: Hematology & Oncology

## 2017-12-10 ENCOUNTER — Inpatient Hospital Stay: Payer: Medicare HMO | Admitting: Hematology & Oncology

## 2017-12-10 DIAGNOSIS — C9110 Chronic lymphocytic leukemia of B-cell type not having achieved remission: Secondary | ICD-10-CM | POA: Diagnosis not present

## 2017-12-10 DIAGNOSIS — Z7901 Long term (current) use of anticoagulants: Secondary | ICD-10-CM | POA: Insufficient documentation

## 2017-12-10 DIAGNOSIS — D72820 Lymphocytosis (symptomatic): Secondary | ICD-10-CM | POA: Diagnosis not present

## 2017-12-10 DIAGNOSIS — E119 Type 2 diabetes mellitus without complications: Secondary | ICD-10-CM | POA: Diagnosis not present

## 2017-12-10 DIAGNOSIS — E663 Overweight: Secondary | ICD-10-CM | POA: Insufficient documentation

## 2017-12-10 DIAGNOSIS — D508 Other iron deficiency anemias: Secondary | ICD-10-CM

## 2017-12-10 LAB — SAVE SMEAR

## 2017-12-10 LAB — CBC WITH DIFFERENTIAL (CANCER CENTER ONLY)
BASOS PCT: 0 %
Basophils Absolute: 0 10*3/uL (ref 0.0–0.1)
EOS ABS: 0.1 10*3/uL (ref 0.0–0.5)
EOS PCT: 1 %
HCT: 45 % (ref 38.7–49.9)
Hemoglobin: 15.3 g/dL (ref 13.0–17.1)
Lymphocytes Relative: 54 %
Lymphs Abs: 5.9 10*3/uL — ABNORMAL HIGH (ref 0.9–3.3)
MCH: 31.2 pg (ref 28.0–33.4)
MCHC: 34 g/dL (ref 32.0–35.9)
MCV: 91.6 fL (ref 82.0–98.0)
Monocytes Absolute: 0.6 10*3/uL (ref 0.1–0.9)
Monocytes Relative: 5 %
Neutro Abs: 4.3 10*3/uL (ref 1.5–6.5)
Neutrophils Relative %: 40 %
PLATELETS: 203 10*3/uL (ref 145–400)
RBC: 4.91 MIL/uL (ref 4.20–5.70)
RDW: 12.8 % (ref 11.1–15.7)
WBC: 10.9 10*3/uL — AB (ref 4.0–10.0)

## 2017-12-10 LAB — CMP (CANCER CENTER ONLY)
ALBUMIN: 3.8 g/dL (ref 3.5–5.0)
ALT: 29 U/L (ref 10–47)
AST: 27 U/L (ref 11–38)
Alkaline Phosphatase: 78 U/L (ref 26–84)
Anion gap: 12 (ref 5–15)
BUN: 18 mg/dL (ref 7–22)
CHLORIDE: 103 mmol/L (ref 98–108)
CO2: 27 mmol/L (ref 18–33)
Calcium: 9.5 mg/dL (ref 8.0–10.3)
Creatinine: 1.1 mg/dL (ref 0.60–1.20)
GLUCOSE: 213 mg/dL — AB (ref 73–118)
POTASSIUM: 3.3 mmol/L (ref 3.3–4.7)
SODIUM: 142 mmol/L (ref 128–145)
Total Bilirubin: 1.6 mg/dL (ref 0.2–1.6)
Total Protein: 6.8 g/dL (ref 6.4–8.1)

## 2017-12-10 LAB — PROTIME-INR
INR: 0.97
PROTHROMBIN TIME: 12.7 s (ref 11.4–15.2)

## 2017-12-10 LAB — FERRITIN: FERRITIN: 64 ng/mL (ref 22–316)

## 2017-12-10 LAB — RETICULOCYTES
RBC.: 4.82 MIL/uL (ref 4.20–5.82)
Retic Count, Absolute: 53 10*3/uL (ref 34.8–93.9)
Retic Ct Pct: 1.1 % (ref 0.8–1.8)

## 2017-12-10 LAB — IRON AND TIBC
IRON: 124 ug/dL (ref 42–163)
SATURATION RATIOS: 35 % — AB (ref 42–163)
TIBC: 356 ug/dL (ref 202–409)
UIBC: 232 ug/dL

## 2017-12-10 LAB — LACTATE DEHYDROGENASE: LDH: 156 U/L (ref 125–245)

## 2017-12-11 ENCOUNTER — Encounter: Payer: Self-pay | Admitting: Hematology & Oncology

## 2017-12-11 ENCOUNTER — Inpatient Hospital Stay (HOSPITAL_BASED_OUTPATIENT_CLINIC_OR_DEPARTMENT_OTHER): Payer: Medicare HMO | Admitting: Hematology & Oncology

## 2017-12-11 ENCOUNTER — Other Ambulatory Visit: Payer: Self-pay

## 2017-12-11 VITALS — BP 122/72 | HR 70 | Temp 98.2°F | Resp 16 | Wt 223.0 lb

## 2017-12-11 DIAGNOSIS — E663 Overweight: Secondary | ICD-10-CM

## 2017-12-11 DIAGNOSIS — C9110 Chronic lymphocytic leukemia of B-cell type not having achieved remission: Secondary | ICD-10-CM

## 2017-12-11 DIAGNOSIS — D72820 Lymphocytosis (symptomatic): Secondary | ICD-10-CM | POA: Diagnosis not present

## 2017-12-11 DIAGNOSIS — E119 Type 2 diabetes mellitus without complications: Secondary | ICD-10-CM

## 2017-12-11 DIAGNOSIS — Z7901 Long term (current) use of anticoagulants: Secondary | ICD-10-CM | POA: Diagnosis not present

## 2017-12-11 LAB — BETA 2 MICROGLOBULIN, SERUM: BETA 2 MICROGLOBULIN: 2.3 mg/L (ref 0.6–2.4)

## 2017-12-11 LAB — ERYTHROPOIETIN: Erythropoietin: 11 m[IU]/mL (ref 2.6–18.5)

## 2017-12-11 NOTE — Progress Notes (Signed)
Hematology and Oncology Follow Up Visit  Brandon Stone [id number] 04/23/1946 72 y.o. 12/11/2017   Principle Diagnosis:   Stage A CLL  Current Therapy:    Observation     Interim History:  Brandon Stone is back for follow-up.  This is a second office visit.  We first saw him back in December.  We first saw him, we did do flow cytometry on his peripheral blood.  The flow cytometry did show that he had a minor B-cell monoclonal population that was consistent with CLL.  Clearly, this CLL is not a clinical issue for him right now.  I think that it is his diabetes that is going to be more of a problem.  He has had no problem with infections.  He has had no swollen lymph nodes.  He has had no change in bowel or bladder habits.  He is trying to lose some weight.  He has had no rashes.  He has had no leg swelling.  He has had no neuropathy in his hands or feet.  Overall, he has had a performed status of ECOG 0.    Medications:  Current Outpatient Medications:  .  acetaminophen (TYLENOL) 325 MG tablet, Take 325 mg by mouth every 6 (six) hours as needed for mild pain., Disp: , Rfl:  .  allopurinol (ZYLOPRIM) 300 MG tablet, Take 1 tablet (300 mg total) by mouth daily., Disp: 90 tablet, Rfl: 3 .  amLODipine (NORVASC) 5 MG tablet, Take 1 tablet (5 mg total) by mouth daily., Disp: 90 tablet, Rfl: 2 .  aspirin EC 81 MG tablet, Take 81 mg by mouth daily., Disp: , Rfl:  .  atorvastatin (LIPITOR) 80 MG tablet, TAKE 1 TABLET EVERY DAY, Disp: 90 tablet, Rfl: 2 .  clopidogrel (PLAVIX) 75 MG tablet, TAKE 1 TABLET EVERY DAY, Disp: 90 tablet, Rfl: 2 .  glipiZIDE (GLUCOTROL XL) 2.5 MG 24 hr tablet, Take 1 tablet (2.5 mg total) by mouth daily with breakfast., Disp: 90 tablet, Rfl: 3 .  isosorbide mononitrate (IMDUR) 60 MG 24 hr tablet, TAKE 1 TABLET EVERY DAY, Disp: 90 tablet, Rfl: 2 .  Loperamide HCl (IMODIUM PO), Take 1 tablet by mouth daily as needed (loosse stools)., Disp: , Rfl:  .  loratadine (CLARITIN)  10 MG tablet, Take 10 mg by mouth daily as needed for allergies., Disp: , Rfl:  .  metoprolol succinate (TOPROL-XL) 25 MG 24 hr tablet, TAKE 1 TABLET TWICE DAILY, Disp: 180 tablet, Rfl: 2 .  MULTIPLE VITAMIN-FOLIC ACID PO, Take 1 tablet by mouth daily. Contains 784 mg folic acid, Disp: , Rfl:  .  Multiple Vitamins-Minerals (MULTIVITAMIN ADULTS PO), , Disp: , Rfl:  .  nitroGLYCERIN (NITROSTAT) 0.4 MG SL tablet, Place 1 tablet (0.4 mg total) under the tongue every 5 (five) minutes as needed for chest pain., Disp: 25 tablet, Rfl: 3 .  Omega-3 Fatty Acids (FISH OIL) 1200 MG CAPS, Take 1 capsule by mouth every evening. , Disp: , Rfl:  .  omeprazole (PRILOSEC) 20 MG capsule, Take 1 capsule (20 mg total) by mouth 2 (two) times daily., Disp: 180 capsule, Rfl: 3 .  potassium chloride SA (K-DUR,KLOR-CON) 20 MEQ tablet, Take 1 tablet (20 mEq total) by mouth every evening., Disp: 90 tablet, Rfl: 1 .  TOLNAFTATE ANTIFUNGAL EX, , Disp: , Rfl:  .  triamcinolone cream (KENALOG) 0.1 %, Apply 1 application topically 2 (two) times daily. Use as needed for dry or itchy skin, Disp: 30 g, Rfl: 2 .  valsartan-hydrochlorothiazide (DIOVAN-HCT) 160-25 MG tablet, TAKE 1 TABLET EVERY DAY, Disp: 90 tablet, Rfl: 2  Allergies:  Allergies  Allergen Reactions  . Other Hives    Intolerance to strong pain medications  Rash to opsite and tegaderm tape.  . Ticlopidine Hcl Hives and Itching  . Black Pepper [[place]] Palpitations    HEART PALPITATIONS HEART PALPITATIONS  . Codeine Palpitations and Other (See Comments)    Wild dreams, palpitations "wild dreams"    Past Medical History, Surgical history, Social history, and Family History were reviewed and updated.  Review of Systems: Review of Systems  Constitutional: Negative.   HENT:  Negative.   Eyes: Negative.   Respiratory: Negative.   Cardiovascular: Negative.   Gastrointestinal: Negative.   Endocrine: Negative.   Genitourinary: Negative.    Musculoskeletal:  Negative.   Skin: Negative.   Neurological: Negative.   Hematological: Negative.   Psychiatric/Behavioral: Negative.     Physical Exam:  weight is 223 lb (101.2 kg). His oral temperature is 98.2 F (36.8 C). His blood pressure is 122/72 and his pulse is 70. His respiration is 16 and oxygen saturation is 98%.   Wt Readings from Last 3 Encounters:  12/11/17 223 lb (101.2 kg)  09/28/17 220 lb (99.8 kg)  07/03/17 229 lb 6.4 oz (104.1 kg)    Physical Exam  Constitutional: He is oriented to person, place, and time.  HENT:  Head: Normocephalic and atraumatic.  Mouth/Throat: Oropharynx is clear and moist.  Eyes: Pupils are equal, round, and reactive to light. EOM are normal.  Neck: Normal range of motion.  Cardiovascular: Normal rate, regular rhythm and normal heart sounds.  Pulmonary/Chest: Effort normal and breath sounds normal.  Abdominal: Soft. Bowel sounds are normal.  Musculoskeletal: Normal range of motion. He exhibits no edema, tenderness or deformity.  Lymphadenopathy:    He has no cervical adenopathy.  Neurological: He is alert and oriented to person, place, and time.  Skin: Skin is warm and dry. No rash noted. No erythema.  Psychiatric: He has a normal mood and affect. His behavior is normal. Judgment and thought content normal.  Vitals reviewed.    Lab Results  Component Value Date   WBC 10.9 (H) 12/10/2017   HGB 15.3 12/10/2017   HCT 45.0 12/10/2017   MCV 91.6 12/10/2017   PLT 203 12/10/2017     Chemistry      Component Value Date/Time   NA 142 12/10/2017 0931   NA 143 06/11/2017 1044   NA 140 04/27/2014 0815   K 3.3 12/10/2017 0931   K 3.2 (L) 06/11/2017 1044   K 3.7 04/27/2014 0815   CL 103 12/10/2017 0931   CL 101 06/11/2017 1044   CO2 27 12/10/2017 0931   CO2 28 06/11/2017 1044   CO2 26 04/27/2014 0815   BUN 18 12/10/2017 0931   BUN 17 06/11/2017 1044   BUN 18.5 04/27/2014 0815   CREATININE 1.10 12/10/2017 0931   CREATININE 1.1 06/11/2017 1044     CREATININE 1.2 04/27/2014 0815      Component Value Date/Time   CALCIUM 9.5 12/10/2017 0931   CALCIUM 9.7 06/11/2017 1044   CALCIUM 9.8 04/27/2014 0815   ALKPHOS 78 12/10/2017 0931   ALKPHOS 83 06/11/2017 1044   ALKPHOS 71 04/27/2014 0815   AST 27 12/10/2017 0931   AST 18 04/27/2014 0815   ALT 29 12/10/2017 0931   ALT 32 06/11/2017 1044   ALT 25 04/27/2014 0815   BILITOT 1.6 12/10/2017 0931   BILITOT  0.67 04/27/2014 0815       Impression and Plan: Mr. Grismer is a 72 year old white male.  He has stage a CLL.  He has minimal lymphocytosis.  I can find nothing on his peripheral blood smear that looks suspicious.  I think at this point, we can probably get him back in 8 months.  I think this would be a reasonable amount of time for follow-up.  I talked him about trying to lose weight.  I told him to drink a lot of water as water can be a good appetite suppressant.  I also talked to him about his blood sugars.  He does not know what his hemoglobin A1c is.  I think this is going to be his biggest health issue for the next few years.   Volanda Napoleon, MD 6/21/201912:01 PM

## 2018-01-14 ENCOUNTER — Encounter: Payer: Self-pay | Admitting: Medical

## 2018-01-14 ENCOUNTER — Ambulatory Visit (HOSPITAL_BASED_OUTPATIENT_CLINIC_OR_DEPARTMENT_OTHER)
Admission: RE | Admit: 2018-01-14 | Discharge: 2018-01-14 | Disposition: A | Discharge status: Home / Self Care | Payer: Medicare HMO | Source: Ambulatory Visit | Attending: Medical | Admitting: Medical

## 2018-01-14 ENCOUNTER — Ambulatory Visit (INDEPENDENT_AMBULATORY_CARE_PROVIDER_SITE_OTHER): Payer: Medicare HMO | Admitting: Medical

## 2018-01-14 VITALS — BP 129/63 | HR 72 | Temp 97.7°F | Resp 16 | Ht 71.0 in | Wt 226.4 lb

## 2018-01-14 DIAGNOSIS — R5383 Other fatigue: Secondary | ICD-10-CM | POA: Diagnosis not present

## 2018-01-14 DIAGNOSIS — W57XXXA Bitten or stung by nonvenomous insect and other nonvenomous arthropods, initial encounter: Secondary | ICD-10-CM

## 2018-01-14 DIAGNOSIS — E119 Type 2 diabetes mellitus without complications: Secondary | ICD-10-CM | POA: Diagnosis not present

## 2018-01-14 DIAGNOSIS — I700 Atherosclerosis of aorta: Secondary | ICD-10-CM | POA: Insufficient documentation

## 2018-01-14 DIAGNOSIS — R350 Frequency of micturition: Secondary | ICD-10-CM | POA: Diagnosis not present

## 2018-01-14 DIAGNOSIS — R82998 Other abnormal findings in urine: Secondary | ICD-10-CM

## 2018-01-14 DIAGNOSIS — J40 Bronchitis, not specified as acute or chronic: Secondary | ICD-10-CM

## 2018-01-14 DIAGNOSIS — S30860A Insect bite (nonvenomous) of lower back and pelvis, initial encounter: Secondary | ICD-10-CM

## 2018-01-14 DIAGNOSIS — R05 Cough: Secondary | ICD-10-CM | POA: Diagnosis not present

## 2018-01-14 DIAGNOSIS — R059 Cough, unspecified: Secondary | ICD-10-CM

## 2018-01-14 DIAGNOSIS — M791 Myalgia, unspecified site: Secondary | ICD-10-CM | POA: Diagnosis not present

## 2018-01-14 LAB — CBC WITH DIFFERENTIAL/PLATELET
BASOS ABS: 0 10*3/uL (ref 0.0–0.1)
Basophils Relative: 0.3 % (ref 0.0–3.0)
EOS ABS: 0 10*3/uL (ref 0.0–0.7)
Eosinophils Relative: 0.4 % (ref 0.0–5.0)
HCT: 44.6 % (ref 39.0–52.0)
Hemoglobin: 15.1 g/dL (ref 13.0–17.0)
LYMPHS ABS: 4.6 10*3/uL — AB (ref 0.7–4.0)
Lymphocytes Relative: 55.6 % — ABNORMAL HIGH (ref 12.0–46.0)
MCHC: 34 g/dL (ref 30.0–36.0)
MCV: 92.2 fl (ref 78.0–100.0)
Monocytes Absolute: 0.8 10*3/uL (ref 0.1–1.0)
Monocytes Relative: 9.8 % (ref 3.0–12.0)
NEUTROS ABS: 2.8 10*3/uL (ref 1.4–7.7)
NEUTROS PCT: 33.9 % — AB (ref 43.0–77.0)
PLATELETS: 183 10*3/uL (ref 150.0–400.0)
RBC: 4.83 Mil/uL (ref 4.22–5.81)
RDW: 13.7 % (ref 11.5–15.5)
WBC: 8.3 10*3/uL (ref 4.0–10.5)

## 2018-01-14 LAB — COMPREHENSIVE METABOLIC PANEL
ALK PHOS: 78 U/L (ref 39–117)
ALT: 28 U/L (ref 0–53)
AST: 25 U/L (ref 0–37)
Albumin: 4.2 g/dL (ref 3.5–5.2)
BUN: 15 mg/dL (ref 6–23)
CHLORIDE: 98 meq/L (ref 96–112)
CO2: 30 mEq/L (ref 19–32)
Calcium: 9.6 mg/dL (ref 8.4–10.5)
Creatinine, Ser: 1.14 mg/dL (ref 0.40–1.50)
GFR: 67.57 mL/min (ref >60.00)
Glucose, Bld: 206 mg/dL — ABNORMAL HIGH (ref 70–99)
POTASSIUM: 3.9 meq/L (ref 3.5–5.1)
Sodium: 136 mEq/L (ref 135–145)
Total Bilirubin: 0.9 mg/dL (ref 0.2–1.2)
Total Protein: 6.6 g/dL (ref 6.0–8.3)

## 2018-01-14 LAB — SEDIMENTATION RATE: Sed Rate: 4 mm/hr (ref 0–20)

## 2018-01-14 LAB — POC URINALSYSI DIPSTICK (AUTOMATED)
BILIRUBIN UA: NEGATIVE
Blood, UA: NEGATIVE
Glucose, UA: POSITIVE — AB
KETONES UA: NEGATIVE
LEUKOCYTES UA: NEGATIVE
Nitrite, UA: NEGATIVE
PH UA: 5 (ref 5.0–8.0)
Protein, UA: NEGATIVE
Spec Grav, UA: >=1.03 — AB (ref 1.010–1.025)
Urobilinogen, UA: NEGATIVE E.U./dL — AB

## 2018-01-14 LAB — C-REACTIVE PROTEIN: CRP: 0.6 mg/dL (ref 0.5–20.0)

## 2018-01-14 LAB — TSH: TSH: 2.02 u[IU]/mL (ref 0.35–4.50)

## 2018-01-14 LAB — PSA: PSA: 0.8 ng/mL (ref 0.10–4.00)

## 2018-01-14 LAB — HEMOGLOBIN A1C: Hgb A1c MFr Bld: 7.8 % — ABNORMAL HIGH (ref 4.6–6.5)

## 2018-01-14 MED ORDER — DOXYCYCLINE HYCLATE 100 MG PO TABS
100.0000 mg | ORAL_TABLET | Freq: Two times a day (BID) | ORAL | 0 refills | Status: DC
Start: 2018-01-14 — End: 2018-02-04

## 2018-01-14 MED ORDER — BENZONATATE 100 MG PO CAPS: 100.0000 mg | ORAL_CAPSULE | Freq: Three times a day (TID) | ORAL | 0 refills | Status: DC | PRN

## 2018-01-14 NOTE — Patient Instructions (Addendum)
For your recent tick bite, we will get tick bite studies.  For your recent cough has become productive, we will get a chest x-ray.  Your recent myalgias, fever and fatigue might be related to lung infection or tick bite.  For these signs and symptoms decided to get CBC, CMP, TSH, and inflammation studies.  For frequent urination decided to get psa and UA.  For aches or fever recommend use of tylenol and/or ibuprofen as advised.  Keep self hydrate. Benzonatate for cough and doxycycline antibiotic. RX advisement given.(covereage for tick born illness and bronchitis)  Follow up in 7-10 days or as needed

## 2018-01-14 NOTE — Progress Notes (Signed)
Subjective:    Patient ID: Brandon Stone, male    DOB: [birth year], 72 y.o.   MRN: [id number]  HPI  Pt in states just recently he has been having some diffuse body aches with some joint pain. He states muscle pain has been worse.    He also mentions he was doing some would work. He was not wearing mask. He breaths in some saw dust. No wheezing, no sob. But next day started to feel tired. This morning started to cough with little whitish colored mucus. Last 2 days felt some feverish. Body aches last from afternoon.(pt states muscle aches first then states skin sore) pt does report last week he found very tiny small tick in rt groin area.   He states some mild dark urine recently. He hydrated well and urine looked more normal/clear. Pt states last week he rode his bike but very early in morning before severe heat. He usually rides about 10-15 miles. Last day rode wax 9.5 miles.    Review of Systems  Constitutional: Positive for fatigue and fever. Negative for chills.  Respiratory: Positive for cough. Negative for chest tightness, shortness of breath and wheezing.   Cardiovascular: Negative for chest pain and palpitations.  Gastrointestinal: Negative for abdominal pain, diarrhea, nausea and vomiting.  Genitourinary:       Dark urine.  Mild frequent urination.  Musculoskeletal: Positive for arthralgias and myalgias.  Skin: Negative for rash.  Hematological: Negative for adenopathy. Does not bruise/bleed easily.  Psychiatric/Behavioral: Negative for behavioral problems and confusion. The patient is not nervous/anxious.     Past Medical History:  Diagnosis Date  . Anginal pain ([place])   . Arthritis   . Barrett's esophagus   . BPH (benign prostatic hypertrophy)   . CAD (coronary artery disease)     a. CABG 1991;  b.  [place];  c.  S/P PTCA  stents to SVG ot OM last in 2003;  d.  Redo CABG x 2 in 2004 (VG->OM->LCX, LRA->PDA);  e. 12/2011 Cath: 3vd, sev LM into LCX dzs, VG->OM 100,  LIMA->DIAG->LAD patent, Rad Art->PDA patent, EF 45%, Med Rx.  . Diverticulosis   . DM (diabetes mellitus) ([place])   . Dyslipidemia   . Dyspnea   . Gallstones   . GERD (gastroesophageal reflux disease)   . Gout   . History of hiatal hernia   . HTN (hypertension)   . Hyperlipidemia   . Leukemia in remission ([place])   . Psoriasis   . Rheumatic fever    as a child  . S/P coronary artery stent placement 1998  . TIA (transient ischemic attack)    FOUND ON MRI  PER PATIENT-     NO PROBLEM     Social History   Socioeconomic History  . Marital status: Married    Spouse name: Not on file  . Number of children: 2  . Years of education: Not on file  . Highest education level: Not on file  Occupational History  . Occupation: retired  Scientific laboratory technician  . Financial resource strain: Not on file  . Food insecurity:    Worry: Not on file    Inability: Not on file  . Transportation needs:    Medical: Not on file    Non-medical: Not on file  Tobacco Use  . Smoking status: Former Smoker    Years: 8.00    Last attempt to quit: 06/24/1971    Years since quitting: 46.5  . Smokeless tobacco: Never Used  Substance and Sexual Activity  . Alcohol use: No  . Drug use: No  . Sexual activity: Yes  Lifestyle  . Physical activity:    Days per week: Not on file    Minutes per session: Not on file  . Stress: Not on file  Relationships  . Social connections:    Talks on phone: Not on file    Gets together: Not on file    Attends religious service: Not on file    Active member of club or organization: Not on file    Attends meetings of clubs or organizations: Not on file    Relationship status: Not on file  . Intimate partner violence:    Fear of current or ex partner: Not on file    Emotionally abused: Not on file    Physically abused: Not on file    Forced sexual activity: Not on file  Other Topics Concern  . Not on file  Social History Narrative  . Not on file    Past Surgical History:    Procedure Laterality Date  . APPENDECTOMY    . CHOLECYSTECTOMY N/A 09/18/2016   Procedure: LAPAROSCOPIC CHOLECYSTECTOMY WITH INTRAOPERATIVE CHOLANGIOGRAM;  Surgeon: Jackolyn Confer, MD;  Location: [facility];  Service: General;  Laterality: N/A;  . COLONOSCOPY  07/2013  . CORONARY ANGIOPLASTY     STENTS  . CORONARY ARTERY BYPASS GRAFT     1991 (LIMA to LAD/Diag;  SVG to RCA;  SVG to OM)  . CORONARY ARTERY BYPASS GRAFT     2004 (L radial to PDA; SVG to OM1/ distal LCx)  . TONSILLECTOMY AND ADENOIDECTOMY    . UMBILICAL HERNIA REPAIR      Family History  Problem Relation Age of Onset  . Heart disease Mother   . Hypertension Mother   . Hyperlipidemia Mother   . Diabetes Mother   . Skin cancer Mother        skin  . Heart disease Father   . Hypertension Father   . Hyperlipidemia Father   . Diabetes Father   . Stroke Sister   . Stroke Brother   . Colon cancer Neg Hx     Allergies  Allergen Reactions  . Other Hives    Intolerance to strong pain medications  Rash to opsite and tegaderm tape.  . Ticlopidine Hcl Hives and Itching  . Black Pepper [[place]] Palpitations    HEART PALPITATIONS HEART PALPITATIONS  . Codeine Palpitations and Other (See Comments)    Wild dreams, palpitations "wild dreams"    Current Outpatient Medications on File Prior to Visit  Medication Sig Dispense Refill  . acetaminophen (TYLENOL) 325 MG tablet Take 325 mg by mouth every 6 (six) hours as needed for mild pain.    Marland Kitchen allopurinol (ZYLOPRIM) 300 MG tablet Take 1 tablet (300 mg total) by mouth daily. 90 tablet 3  . amLODipine (NORVASC) 5 MG tablet Take 1 tablet (5 mg total) by mouth daily. 90 tablet 2  . aspirin EC 81 MG tablet Take 81 mg by mouth daily.    Marland Kitchen atorvastatin (LIPITOR) 80 MG tablet TAKE 1 TABLET EVERY DAY 90 tablet 2  . clopidogrel (PLAVIX) 75 MG tablet TAKE 1 TABLET EVERY DAY 90 tablet 2  . glipiZIDE (GLUCOTROL XL) 2.5 MG 24 hr tablet Take 1 tablet (2.5 mg total) by mouth daily with  breakfast. 90 tablet 3  . isosorbide mononitrate (IMDUR) 60 MG 24 hr tablet TAKE 1 TABLET EVERY DAY 90 tablet 2  .  Loperamide HCl (IMODIUM PO) Take 1 tablet by mouth daily as needed (loosse stools).    . loratadine (CLARITIN) 10 MG tablet Take 10 mg by mouth daily as needed for allergies.    . metoprolol succinate (TOPROL-XL) 25 MG 24 hr tablet TAKE 1 TABLET TWICE DAILY 180 tablet 2  . MULTIPLE VITAMIN-FOLIC ACID PO Take 1 tablet by mouth daily. Contains 341 mg folic acid    . Multiple Vitamins-Minerals (MULTIVITAMIN ADULTS PO)     . nitroGLYCERIN (NITROSTAT) 0.4 MG SL tablet Place 1 tablet (0.4 mg total) under the tongue every 5 (five) minutes as needed for chest pain. 25 tablet 3  . Omega-3 Fatty Acids (FISH OIL) 1200 MG CAPS Take 1 capsule by mouth every evening.     Marland Kitchen omeprazole (PRILOSEC) 20 MG capsule Take 1 capsule (20 mg total) by mouth 2 (two) times daily. 180 capsule 3  . potassium chloride SA (K-DUR,KLOR-CON) 20 MEQ tablet Take 1 tablet (20 mEq total) by mouth every evening. 90 tablet 1  . TOLNAFTATE ANTIFUNGAL EX     . triamcinolone cream (KENALOG) 0.1 % Apply 1 application topically 2 (two) times daily. Use as needed for dry or itchy skin 30 g 2  . valsartan-hydrochlorothiazide (DIOVAN-HCT) 160-25 MG tablet TAKE 1 TABLET EVERY DAY 90 tablet 2   No current facility-administered medications on file prior to visit.     BP 129/63   Pulse 72   Temp 97.7 F (36.5 C) (Oral)   Resp 16   Ht 5\' 11"  (1.803 m)   Wt 226 lb 6.4 oz (102.7 kg)   SpO2 99%   BMI 31.58 kg/m      Objective:   Physical Exam  General Mental Status- Alert. General Appearance- Not in acute distress.   Skin General: Color- Normal Color. Moisture- Normal Moisture. 4 tiny red raised area rt upper thigh(one of which tick was attached per pt)  Neck Carotid Arteries- Normal color. Moisture- Normal Moisture. No carotid bruits. No JVD.  Chest and Lung Exam Auscultation: Breath  Sounds:-Normal.  Cardiovascular Auscultation:Rythm- Regular. Murmurs & Other Heart Sounds:Auscultation of the heart reveals- No Murmurs.  Abdomen Inspection:-Inspeection Normal. Palpation/Percussion:Note:No mass. Palpation and Percussion of the abdomen reveal- Non Tender, Non Distended + BS, no rebound or guarding.   Neurologic Cranial Nerve exam:- CN III-XII intact(No nystagmus), symmetric smile. Strength:- 5/5 equal and symmetric strength both upper and lower extremities.   Heent- negative exam. No sinus pressure.    Assessment & Plan:  For your recent tick bite, we will get tick bite studies.  For your recent cough has become productive, we will get a chest x-ray.  Your recent myalgias, fever and fatigue might be related to lung infection or tick bite.  For these signs and symptoms decided to get CBC, CMP, TSH, and inflammation studies.  For frequent urination decided to get psa and UA.  For aches or fever recommend use of tylenol and/or ibuprofen as advised.  Keep self hydrate. Benzonatate for cough and doxycycline antibiotic. RX advisement given.  Follow up in 7-10 days or as needed  [employer], [employer]

## 2018-01-18 LAB — ROCKY MTN SPOTTED FVR ABS PNL(IGG+IGM)
RMSF IGG: NOT DETECTED
RMSF IGM: NOT DETECTED

## 2018-01-18 LAB — B. BURGDORFI ANTIBODIES

## 2018-01-18 LAB — RHEUMATOID FACTOR

## 2018-01-18 LAB — ANA: Anti Nuclear Antibody(ANA): NEGATIVE

## 2018-01-31 DIAGNOSIS — E119 Type 2 diabetes mellitus without complications: Secondary | ICD-10-CM | POA: Insufficient documentation

## 2018-01-31 NOTE — Progress Notes (Addendum)
[facility] [place], [place], [place] [address] [phone]  Date:  02/04/2018   Name:  Brandon Stone   DOB:  1945/09/19   MRN:  [id number]  PCP:  Darreld Mclean, MD    Chief Complaint: Annual Exam (fasting labs) and Hip Pain (sees chiropracter)   History of Present Illness:  Brandon Stone is a 72 y.o. very pleasant male patient who presents with the following:  Here today for a CPE History of CLL, CAD/ angina, hyperlipidemia, HTN, DM He is monitoring his BP and notes that it is doing well; he may run 120s or less/ 60s His glucose is also doing ok- fasting at 140s- he never notes any hypoglycemia He is trying to stay away from sugars  He is riding his bike for exercise as much as he can He does see a chiropractor   He is on glipizide 2.5 mg - was on metformin in the past but this got stopped when he had his gallbladder out.  He would be happy to go back on it however   From his most recent visit in June with Dr. Marin Olp: Brandon Stone is a 72 year old white male.  He has stage a CLL.  He has minimal lymphocytosis. I can find nothing on his peripheral blood smear that looks suspicious. I think at this point, we can probably get him back in 8 months.  I think this would be a reasonable amount of time for follow-up.  Cardiology: last visit with Dr. Harrington Challenger in January: CAD (s/p CABG in 1999; [place] in 1998. Redo CABG in 2004 (SVG to OM/LCx; LRA to PDA). Cath in July 2013 due to increased CP showed severe LM disease into Lcx.; SVG to OM 100%; LIMA to Diag and LAD patent; Radial Artery to PDA patent LVEF 45% Plan was to continue medical Rx. I saw the pt in Summer 2018  Complained of dyspnea  I was not convinced cardiac  Felt recovering URI  Echo showed LVEF normal  Mild diastolic dysfunciton.   Pt's breathing is back to baseline  Still gets SOB with talking   Walking from parking lot did fine   With activity he says he  does fine  "When I get going I am fine"    I last saw him in November of last year He had 2 children with his first wife, and 2 grands, 2 great grands  He and his second wife married about 6 years ago  Lab Results  Component Value Date   HGBA1C 7.8 (H) 01/14/2018   Foot exam due: done today Eye exam:  About a year ago, he will get this done  Colon: 1/18 Immun: offer singrix Labs:  Had some done recently, needs a lipid panel He is fasting today   He was on metformin in the past but stopped when he was having gallbladder issues - however otherwise he never had any intolerance   lipitor plavix imdur toprol xl diovan hct Nitro prn  Glipizide 2.5  kdur 20  prilosec Amlodipine 5 Asa 81   Lab Results  Component Value Date   PSA 0.80 01/14/2018   PSA 0.82 08/27/2015    Patient Active Problem List   Diagnosis Date Noted  . Diabetes mellitus without complication ([place] [place]) 12/87/8676  . Symptomatic cholelithiasis 09/18/2016  . Chronic lymphocytic leukemia (CLL), B-cell ([place]) 05/13/2014  . Stable angina ([place]) 01/26/2012  . Sore throat 11/03/2010  . Hyperlipidemia  06/21/2008  . HYPERTENSION, BENIGN 06/21/2008  . CAD, NATIVE VESSEL 06/21/2008    Past Medical History:  Diagnosis Date  . Anginal pain ([facility])   . Arthritis   . Barrett's esophagus   . BPH (benign prostatic hypertrophy)   . CAD (coronary artery disease)     a. CABG 1991;  b.  [place];  c.  S/P PTCA  stents to SVG ot OM last in 2003;  d.  Redo CABG x 2 in 2004 (VG->OM->LCX, LRA->PDA);  e. 12/2011 Cath: 3vd, sev LM into LCX dzs, VG->OM 100, LIMA->DIAG->LAD patent, Rad Art->PDA patent, EF 45%, Med Rx.  . Diverticulosis   . DM (diabetes mellitus) ([facility])   . Dyslipidemia   . Dyspnea   . Gallstones   . GERD (gastroesophageal reflux disease)   . Gout   . History of hiatal hernia   . HTN (hypertension)   . Hyperlipidemia   . Leukemia in remission ([place])   . Psoriasis   . Rheumatic fever    as a child  . S/P  coronary artery stent placement 1998  . TIA (transient ischemic attack)    FOUND ON MRI  PER PATIENT-     NO PROBLEM    Past Surgical History:  Procedure Laterality Date  . APPENDECTOMY    . CHOLECYSTECTOMY N/A 09/18/2016   Procedure: LAPAROSCOPIC CHOLECYSTECTOMY WITH INTRAOPERATIVE CHOLANGIOGRAM;  Surgeon: Jackolyn Confer, MD;  Location: [facility];  Service: General;  Laterality: N/A;  . COLONOSCOPY  07/2013  . CORONARY ANGIOPLASTY     STENTS  . CORONARY ARTERY BYPASS GRAFT     1991 (LIMA to LAD/Diag;  SVG to RCA;  SVG to OM)  . CORONARY ARTERY BYPASS GRAFT     2004 (L radial to PDA; SVG to OM1/ distal LCx)  . TONSILLECTOMY AND ADENOIDECTOMY    . UMBILICAL HERNIA REPAIR      Social History   Tobacco Use  . Smoking status: Former Smoker    Years: 8.00    Last attempt to quit: 06/24/1971    Years since quitting: 46.6  . Smokeless tobacco: Never Used  Substance Use Topics  . Alcohol use: No  . Drug use: No    Family History  Problem Relation Age of Onset  . Heart disease Mother   . Hypertension Mother   . Hyperlipidemia Mother   . Diabetes Mother   . Skin cancer Mother        skin  . Heart disease Father   . Hypertension Father   . Hyperlipidemia Father   . Diabetes Father   . Stroke Sister   . Stroke Brother   . Colon cancer Neg Hx     Allergies  Allergen Reactions  . Other Hives    Intolerance to strong pain medications  Rash to opsite and tegaderm tape.  . Ticlopidine Hcl Hives and Itching  . Black Pepper [[place]] Palpitations    HEART PALPITATIONS HEART PALPITATIONS  . Codeine Palpitations and Other (See Comments)    Wild dreams, palpitations "wild dreams"    Medication list has been reviewed and updated.  Current Outpatient Medications on File Prior to Visit  Medication Sig Dispense Refill  . acetaminophen (TYLENOL) 325 MG tablet Take 325 mg by mouth every 6 (six) hours as needed for mild pain.    Marland Kitchen allopurinol (ZYLOPRIM) 300 MG tablet Take 1 tablet  (300 mg total) by mouth daily. 90 tablet 3  . amLODipine (NORVASC) 5 MG tablet Take 1 tablet (5  mg total) by mouth daily. 90 tablet 2  . aspirin EC 81 MG tablet Take 81 mg by mouth daily.    Marland Kitchen atorvastatin (LIPITOR) 80 MG tablet TAKE 1 TABLET EVERY DAY 90 tablet 2  . clopidogrel (PLAVIX) 75 MG tablet TAKE 1 TABLET EVERY DAY 90 tablet 2  . glipiZIDE (GLUCOTROL XL) 2.5 MG 24 hr tablet Take 1 tablet (2.5 mg total) by mouth daily with breakfast. 90 tablet 3  . isosorbide mononitrate (IMDUR) 60 MG 24 hr tablet TAKE 1 TABLET EVERY DAY 90 tablet 2  . Loperamide HCl (IMODIUM PO) Take 1 tablet by mouth daily as needed (loosse stools).    . loratadine (CLARITIN) 10 MG tablet Take 10 mg by mouth daily as needed for allergies.    . metoprolol succinate (TOPROL-XL) 25 MG 24 hr tablet TAKE 1 TABLET TWICE DAILY 180 tablet 2  . MULTIPLE VITAMIN-FOLIC ACID PO Take 1 tablet by mouth daily. Contains 443 mg folic acid    . Multiple Vitamins-Minerals (MULTIVITAMIN ADULTS PO)     . nitroGLYCERIN (NITROSTAT) 0.4 MG SL tablet Place 1 tablet (0.4 mg total) under the tongue every 5 (five) minutes as needed for chest pain. 25 tablet 3  . Omega-3 Fatty Acids (FISH OIL) 1200 MG CAPS Take 1 capsule by mouth every evening.     Marland Kitchen omeprazole (PRILOSEC) 20 MG capsule Take 1 capsule (20 mg total) by mouth 2 (two) times daily. 180 capsule 3  . potassium chloride SA (K-DUR,KLOR-CON) 20 MEQ tablet Take 1 tablet (20 mEq total) by mouth every evening. 90 tablet 1  . TOLNAFTATE ANTIFUNGAL EX     . triamcinolone cream (KENALOG) 0.1 % Apply 1 application topically 2 (two) times daily. Use as needed for dry or itchy skin 30 g 2  . valsartan-hydrochlorothiazide (DIOVAN-HCT) 160-25 MG tablet TAKE 1 TABLET EVERY DAY 90 tablet 2   No current facility-administered medications on file prior to visit.     Review of Systems:  As per HPI- otherwise negative. No CP or SOB with exercise- cycling    Physical Examination: Vitals:   02/04/18  0913  BP: 122/70  Pulse: 62  Resp: 16  Temp: 97.6 F (36.4 C)  SpO2: 96%   Vitals:   02/04/18 0913  Weight: 223 lb (101.2 kg)  Height: 5\' 11"  (1.803 m)   Body mass index is 31.1 kg/m. Ideal Body Weight: Weight in (lb) to have BMI = 25: 178.9  GEN: WDWN, NAD, Non-toxic, A & O x 3, obese, looks well  HEENT: Atraumatic, Normocephalic. Neck supple. No masses, No LAD.  Bilateral TM wnl, oropharynx normal.  PEERL,EOMI.   Ears and Nose: No external deformity. CV: RRR, No M/G/R. No JVD. No thrill. No extra heart sounds. PULM: CTA B, no wheezes, crackles, rhonchi. No retractions. No resp. distress. No accessory muscle use. ABD: S, NT, ND, +BS. No rebound. No HSM. EXTR: No c/c/e NEURO Normal gait.  PSYCH: Normally interactive. Conversant. Not depressed or anxious appearing.  Calm demeanor.  Mild toenail fungus of great toenails bilaterally   Assessment and Plan: Physical exam  Essential hypertension  Diabetes mellitus without complication ([facility]) - Plan: metFORMIN (GLUCOPHAGE) 500 MG tablet  Atherosclerosis of native coronary artery of native heart without angina pectoris  History of leukemia  Mixed hyperlipidemia - Plan: Lipid panel  CPE today Overall he is doing well Recent A1c showed ok control but not quite at goal Plan to transition backto metformin Otherwise continue current meds Offered lamisil for his  toenails but he declines for now   Signed Lamar Blinks, MD  Received his labs 8/16 Results for orders placed or performed in visit on 02/04/18  Lipid panel  Result Value Ref Range   Cholesterol 159 0 - 200 mg/dL   Triglycerides 232.0 (H) 0.0 - 149.0 mg/dL   HDL 36.30 (L) >39.00 mg/dL   VLDL 46.4 (H) 0.0 - 40.0 mg/dL   Total CHOL/HDL Ratio 4    NonHDL 123.10   LDL cholesterol, direct  Result Value Ref Range   Direct LDL 94.0 mg/dL

## 2018-02-04 ENCOUNTER — Encounter: Payer: Self-pay | Admitting: Family Medicine

## 2018-02-04 ENCOUNTER — Ambulatory Visit (INDEPENDENT_AMBULATORY_CARE_PROVIDER_SITE_OTHER): Payer: Medicare HMO | Admitting: Family Medicine

## 2018-02-04 VITALS — BP 122/70 | HR 62 | Temp 97.6°F | Resp 16 | Ht 71.0 in | Wt 223.0 lb

## 2018-02-04 DIAGNOSIS — Z856 Personal history of leukemia: Secondary | ICD-10-CM | POA: Diagnosis not present

## 2018-02-04 DIAGNOSIS — I2510 Atherosclerotic heart disease of native coronary artery without angina pectoris: Secondary | ICD-10-CM | POA: Diagnosis not present

## 2018-02-04 DIAGNOSIS — E782 Mixed hyperlipidemia: Secondary | ICD-10-CM

## 2018-02-04 DIAGNOSIS — Z0000 Encounter for general adult medical examination without abnormal findings: Secondary | ICD-10-CM | POA: Diagnosis not present

## 2018-02-04 DIAGNOSIS — I10 Essential (primary) hypertension: Secondary | ICD-10-CM

## 2018-02-04 DIAGNOSIS — E119 Type 2 diabetes mellitus without complications: Secondary | ICD-10-CM

## 2018-02-04 LAB — LIPID PANEL
Cholesterol: 159 mg/dL (ref 0–200)
HDL: 36.3 mg/dL — AB (ref >39.00)
NONHDL: 123.1
Total CHOL/HDL Ratio: 4
Triglycerides: 232 mg/dL — ABNORMAL HIGH (ref 0.0–149.0)
VLDL: 46.4 mg/dL — AB (ref 0.0–40.0)

## 2018-02-04 LAB — LDL CHOLESTEROL, DIRECT: LDL DIRECT: 94 mg/dL

## 2018-02-04 MED ORDER — METFORMIN HCL 500 MG PO TABS: 500.0000 mg | ORAL_TABLET | Freq: Two times a day (BID) | ORAL | 3 refills | Status: DC

## 2018-02-04 NOTE — Patient Instructions (Addendum)
You might get the new shingles vaccine- shingrix- done at your drug store at your convenience  You have toenail fungus- this is not harmful, but if you get sick of it and want to use the oral med (lamisli for 3 months) let me know  Let's transition back to metformin for you now that your gallbladder is no longer an issue Start on 500 mg of metformin once a day while you also finish out the glipizide that you have.  Once you finish the glipizide increase the metformin to 500 mg twice daily   Let me know if any concenrns with this change  Let's see you and check your A1c in November - would like to get it under 7% Lab Results  Component Value Date   HGBA1C 7.8 (H) 01/14/2018   Lab Results  Component Value Date   PSA 0.80 01/14/2018   PSA 0.82 08/27/2015   Recentt PSA was ok   Health Maintenance, Male A healthy lifestyle and preventive care is important for your health and wellness. Ask your health care provider about what schedule of regular examinations is right for you. What should I know about weight and diet? Eat a Healthy Diet  Eat plenty of vegetables, fruits, whole grains, low-fat dairy products, and lean protein.  Do not eat a lot of foods high in solid fats, added sugars, or salt.  Maintain a Healthy Weight Regular exercise can help you achieve or maintain a healthy weight. You should:  Do at least 150 minutes of exercise each week. The exercise should increase your heart rate and make you sweat (moderate-intensity exercise).  Do strength-training exercises at least twice a week.  Watch Your Levels of Cholesterol and Blood Lipids  Have your blood tested for lipids and cholesterol every 5 years starting at 70 years of age. If you are at high risk for heart disease, you should start having your blood tested when you are 70 years old. You may need to have your cholesterol levels checked more often if: ? Your lipid or cholesterol levels are high. ? You are older than 70  years of age. ? You are at high risk for heart disease.  What should I know about cancer screening? Many types of cancers can be detected early and may often be prevented. Lung Cancer  You should be screened every year for lung cancer if: ? You are a current smoker who has smoked for at least 30 years. ? You are a former smoker who has quit within the past 15 years.  Talk to your health care provider about your screening options, when you should start screening, and how often you should be screened.  Colorectal Cancer  Routine colorectal cancer screening usually begins at 70 years of age and should be repeated every 5-10 years until you are 70 years old. You may need to be screened more often if early forms of precancerous polyps or small growths are found. Your health care provider may recommend screening at an earlier age if you have risk factors for colon cancer.  Your health care provider may recommend using home test kits to check for hidden blood in the stool.  A small camera at the end of a tube can be used to examine your colon (sigmoidoscopy or colonoscopy). This checks for the earliest forms of colorectal cancer.  Prostate and Testicular Cancer  Depending on your age and overall health, your health care provider may do certain tests to screen for prostate and testicular  cancer.  Talk to your health care provider about any symptoms or concerns you have about testicular or prostate cancer.  Skin Cancer  Check your skin from head to toe regularly.  Tell your health care provider about any new moles or changes in moles, especially if: ? There is a change in a mole's size, shape, or color. ? You have a mole that is larger than a pencil eraser.  Always use sunscreen. Apply sunscreen liberally and repeat throughout the day.  Protect yourself by wearing long sleeves, pants, a wide-brimmed hat, and sunglasses when outside.  What should I know about heart disease, diabetes, and  high blood pressure?  If you are 84-45 years of age, have your blood pressure checked every 3-5 years. If you are 35 years of age or older, have your blood pressure checked every year. You should have your blood pressure measured twice-once when you are at a hospital or clinic, and once when you are not at a hospital or clinic. Record the average of the two measurements. To check your blood pressure when you are not at a hospital or clinic, you can use: ? An automated blood pressure machine at a pharmacy. ? A home blood pressure monitor.  Talk to your health care provider about your target blood pressure.  If you are between 46-50 years old, ask your health care provider if you should take aspirin to prevent heart disease.  Have regular diabetes screenings by checking your fasting blood sugar level. ? If you are at a normal weight and have a low risk for diabetes, have this test once every three years after the age of 78. ? If you are overweight and have a high risk for diabetes, consider being tested at a younger age or more often.  A one-time screening for abdominal aortic aneurysm (AAA) by ultrasound is recommended for men aged 39-75 years who are current or former smokers. What should I know about preventing infection? Hepatitis B If you have a higher risk for hepatitis B, you should be screened for this virus. Talk with your health care provider to find out if you are at risk for hepatitis B infection. Hepatitis C Blood testing is recommended for:  Everyone born from [birth year] through 1963.  Anyone with known risk factors for hepatitis C.  Sexually Transmitted Diseases (STDs)  You should be screened each year for STDs including gonorrhea and chlamydia if: ? You are sexually active and are younger than 70 years of age. ? You are older than 70 years of age and your health care provider tells you that you are at risk for this type of infection. ? Your sexual activity has changed since you  were last screened and you are at an increased risk for chlamydia or gonorrhea. Ask your health care provider if you are at risk.  Talk with your health care provider about whether you are at high risk of being infected with HIV. Your health care provider may recommend a prescription medicine to help prevent HIV infection.  What else can I do?  Schedule regular health, dental, and eye exams.  Stay current with your vaccines (immunizations).  Do not use any tobacco products, such as cigarettes, chewing tobacco, and e-cigarettes. If you need help quitting, ask your health care provider.  Limit alcohol intake to no more than 2 drinks per day. One drink equals 12 ounces of beer, 5 ounces of wine, or 1 ounces of hard liquor.  Do not use street drugs.  Do not share needles.  Ask your health care provider for help if you need support or information about quitting drugs.  Tell your health care provider if you often feel depressed.  Tell your health care provider if you have ever been abused or do not feel safe at home. This information is not intended to replace advice given to you by your health care provider. Make sure you discuss any questions you have with your health care provider. Document Released: 12/06/2007 Document Revised: 02/06/2016 Document Reviewed: 03/13/2015 Elsevier Interactive Patient Education  [employer].

## 2018-02-05 ENCOUNTER — Encounter: Payer: Self-pay | Admitting: Family Medicine

## 2018-02-15 ENCOUNTER — Encounter: Payer: Self-pay | Admitting: Family Medicine

## 2018-02-15 NOTE — Telephone Encounter (Signed)
Patient sent another note stating he meant to put 112 instead of 122.

## 2018-04-05 ENCOUNTER — Ambulatory Visit (INDEPENDENT_AMBULATORY_CARE_PROVIDER_SITE_OTHER): Payer: Medicare HMO

## 2018-04-05 DIAGNOSIS — Z23 Encounter for immunization: Secondary | ICD-10-CM | POA: Diagnosis not present

## 2018-05-11 DIAGNOSIS — H524 Presbyopia: Secondary | ICD-10-CM | POA: Diagnosis not present

## 2018-05-11 DIAGNOSIS — Z0100 Encounter for examination of eyes and vision without abnormal findings: Secondary | ICD-10-CM | POA: Diagnosis not present

## 2018-05-12 ENCOUNTER — Telehealth: Payer: Self-pay | Admitting: Family Medicine

## 2018-05-12 NOTE — Telephone Encounter (Signed)
Copied from [facility] [id number]. Topic: Quick Communication - Rx Refill/Question >> May 12, 2018  9:40 AM Burchel, Abbi R wrote: Medication: AccuCheck Aviva Plus Meter and supplies   Pt states he got a letter from [facility] letting him know that he was eligible for a new meter and testing supplies.  Please send rx  to:   Preferred Pharmacy: [facility], [place] [phone] (Phone) [phone] (Fax)  Pt was advised that RX refills may take up to 3 business days. We ask that you follow-up with your pharmacy.

## 2018-05-13 MED ORDER — BLOOD GLUCOSE METER KIT: PACK | 0 refills | Status: DC

## 2018-05-13 NOTE — Telephone Encounter (Signed)
Kit has been sent to pharmacy.

## 2018-05-20 NOTE — Progress Notes (Addendum)
[facility] [address], [place], [place] [address] [phone]  Date:  05/24/2018   Name:  Brandon Stone   DOB:  Apr 03, 1946   MRN:  [id number]  PCP:  Darreld Mclean, MD    Chief Complaint: Diabetes (3-4 motns follow up, metformin refill 90 day humana)   History of Present Illness:  Brandon Stone is a 72 y.o. very pleasant male patient who presents with the following:  Periodic follow-up visit today History of DM, CLL, HTN, hyperlipidemia,CAD He is taking glipiizide and metformin- as he finishes up his current bottle of glipizide we plan for him to stop this med and increase his metformin to BID He is checking his am glucose and his numbers are generally around 125 He may feel a bit dizzy if he eats too much sugar so he tries to avoid this He is turning 72 this week ! He feels like he is doing well for his age   Lab Results  Component Value Date   HGBA1C 7.8 (H) 01/14/2018    Former smoker- had a CT abd pelvis in 2017, no AAA  Last seen by myself in August: He is monitoring his BP and notes that it is doing well; he may run 120s or less/ 60s His glucose is also doing ok- fasting at 140s- he never notes any hypoglycemia He is trying to stay away from sugars  He is riding his bike for exercise as much as he can He does see a chiropractor  He is on glipizide 2.5 mg - was on metformin in the past but this got stopped when he had his gallbladder out.  He would be happy to go back on it however   From his most recent visit in June with Dr. Marin Olp: Mr.Heathis a89 year old white male. He has stage a CLL. He has minimal lymphocytosis. I can find nothing on his peripheral blood smear that looks suspicious. I think at this point, we can probably get him back in 8 months. I think this would be a reasonable amount of time for follow-up.  Cardiology: last visit with Dr. Harrington Challenger in January: CAD (s/p CABG in 1999; [place]  in 1998. Redo CABG in 2004 (SVG to OM/LCx; LRA to PDA). Cath in July 2013 due to increased CP showed severe LM disease into Lcx.; SVG to OM 100%; LIMA to Diag and LAD patent; Radial Artery to PDA patent LVEF 45% Plan was to continue medical Rx. I saw the pt in Summer 2018 Complained of dyspnea I was not convinced cardiac Felt recovering URI Echo showed LVEF normal Mild diastolic dysfunciton.  Pt's breathing is back to baseline Still gets SOB with talking Walking from parking lot did fine With activity he says he does fine "When I get going I am fine"  I last saw him in November of last year He had 2 children with his first wife, and 2 grands, 2 great grands  He and his second wife married about 6 years ago  We had planned to repeat an A1c today Eye exam:  This was done about 2 weeks ago, looked ok Shingrix:  Discussed with him, he may have done at drug store He did have shingles years ago  He is taking one glipizide and one metformin right now-as soon as he finished his current supply of glipizide he will go to metformin BID   He is seeing Dr. Harrington Challenger in February  Will also  see Ennever in February   He is using a topical lamisil OTC for this toenails right now- he is not sure if this is helping him They do not hurt at all but he doesnot like the look,would like to take lamisil PO for this   2 weeks ago he was helping a neighbor out by feeding their dog- he went into their home and the alarm went off unexpectedly.  He felt panicky and had some CP, and felt pain into his left arm. This lasted 10-73mnutes. He took a nitro and CP resolved. However he then had palpitations for a couple of days. Palp resolved now but I do notice irregular heart beat today- will check EKG  No more CP since this episode 2 weeks ago  Patient Active Problem List   Diagnosis Date Noted  . Diabetes mellitus without complication ([place] 078/29/5621 . Symptomatic cholelithiasis 09/18/2016  . Chronic  lymphocytic leukemia (CLL), B-cell ([facility] 05/13/2014  . Stable angina ([facility] 01/26/2012  . Sore throat 11/03/2010  . Hyperlipidemia 06/21/2008  . HYPERTENSION, BENIGN 06/21/2008  . CAD, NATIVE VESSEL 06/21/2008    Past Medical History:  Diagnosis Date  . Anginal pain ([place]   . Arthritis   . Barrett's esophagus   . BPH (benign prostatic hypertrophy)   . CAD (coronary artery disease)     a. CABG 1991;  b.  [place]  c.  S/P PTCA  stents to SVG ot OM last in 2003;  d.  Redo CABG x 2 in 2004 (VG->OM->LCX, LRA->PDA);  e. 12/2011 Cath: 3vd, sev LM into LCX dzs, VG->OM 100, LIMA->DIAG->LAD patent, Rad Art->PDA patent, EF 45%, Med Rx.  . Diverticulosis   . DM (diabetes mellitus) ([facility]   . Dyslipidemia   . Dyspnea   . Gallstones   . GERD (gastroesophageal reflux disease)   . Gout   . History of hiatal hernia   . HTN (hypertension)   . Hyperlipidemia   . Leukemia in remission ([place]   . Psoriasis   . Rheumatic fever    as a child  . S/P coronary artery stent placement 1998  . TIA (transient ischemic attack)    FOUND ON MRI  PER PATIENT-     NO PROBLEM    Past Surgical History:  Procedure Laterality Date  . APPENDECTOMY    . CHOLECYSTECTOMY N/A 09/18/2016   Procedure: LAPAROSCOPIC CHOLECYSTECTOMY WITH INTRAOPERATIVE CHOLANGIOGRAM;  Surgeon: TJackolyn Confer MD;  [facility]  Service: General;  Laterality: N/A;  . COLONOSCOPY  07/2013  . CORONARY ANGIOPLASTY     STENTS  . CORONARY ARTERY BYPASS GRAFT     1991 (LIMA to LAD/Diag;  SVG to RCA;  SVG to OM)  . CORONARY ARTERY BYPASS GRAFT     2004 (L radial to PDA; SVG to OM1/ distal LCx)  . TONSILLECTOMY AND ADENOIDECTOMY    . UMBILICAL HERNIA REPAIR      Social History   Tobacco Use  . Smoking status: Former Smoker    Years: 8.00    Last attempt to quit: 06/24/1971    Years since quitting: 46.9  . Smokeless tobacco: Never Used  Substance Use Topics  . Alcohol use: No  . Drug use: No    Family History  Problem  Relation Age of Onset  . Heart disease Mother   . Hypertension Mother   . Hyperlipidemia Mother   . Diabetes Mother   . Skin cancer Mother        skin  .  Heart disease Father   . Hypertension Father   . Hyperlipidemia Father   . Diabetes Father   . Stroke Sister   . Stroke Brother   . Colon cancer Neg Hx     Allergies  Allergen Reactions  . Other Hives    Intolerance to strong pain medications  Rash to opsite and tegaderm tape.  . Ticlopidine Hcl Hives and Itching  . Black Pepper [[place]] Palpitations    HEART PALPITATIONS HEART PALPITATIONS  . Codeine Palpitations and Other (See Comments)    Wild dreams, palpitations "wild dreams"    Medication list has been reviewed and updated.  Current Outpatient Medications on File Prior to Visit  Medication Sig Dispense Refill  . acetaminophen (TYLENOL) 325 MG tablet Take 325 mg by mouth every 6 (six) hours as needed for mild pain.    Marland Kitchen allopurinol (ZYLOPRIM) 300 MG tablet Take 1 tablet (300 mg total) by mouth daily. 90 tablet 3  . amLODipine (NORVASC) 5 MG tablet Take 1 tablet (5 mg total) by mouth daily. 90 tablet 2  . aspirin EC 81 MG tablet Take 81 mg by mouth daily.    Marland Kitchen atorvastatin (LIPITOR) 80 MG tablet TAKE 1 TABLET EVERY DAY 90 tablet 2  . blood glucose meter kit and supplies Dispense based on patient and insurance preference. Use up to four times daily as directed. (FOR ICD-10 E10.9, E11.9). 1 each 0  . clopidogrel (PLAVIX) 75 MG tablet TAKE 1 TABLET EVERY DAY 90 tablet 2  . isosorbide mononitrate (IMDUR) 60 MG 24 hr tablet TAKE 1 TABLET EVERY DAY 90 tablet 2  . Loperamide HCl (IMODIUM PO) Take 1 tablet by mouth daily as needed (loosse stools).    . loratadine (CLARITIN) 10 MG tablet Take 10 mg by mouth daily as needed for allergies.    . metFORMIN (GLUCOPHAGE) 500 MG tablet Take 1 tablet (500 mg total) by mouth 2 (two) times daily with a meal. 180 tablet 3  . metoprolol succinate (TOPROL-XL) 25 MG 24 hr tablet TAKE 1  TABLET TWICE DAILY 180 tablet 2  . MULTIPLE VITAMIN-FOLIC ACID PO Take 1 tablet by mouth daily. Contains 759 mg folic acid    . Multiple Vitamins-Minerals (MULTIVITAMIN ADULTS PO)     . nitroGLYCERIN (NITROSTAT) 0.4 MG SL tablet Place 1 tablet (0.4 mg total) under the tongue every 5 (five) minutes as needed for chest pain. 25 tablet 3  . Omega-3 Fatty Acids (FISH OIL) 1200 MG CAPS Take 1 capsule by mouth every evening.     Marland Kitchen omeprazole (PRILOSEC) 20 MG capsule Take 1 capsule (20 mg total) by mouth 2 (two) times daily. 180 capsule 3  . potassium chloride SA (K-DUR,KLOR-CON) 20 MEQ tablet Take 1 tablet (20 mEq total) by mouth every evening. 90 tablet 1  . TOLNAFTATE ANTIFUNGAL EX     . triamcinolone cream (KENALOG) 0.1 % Apply 1 application topically 2 (two) times daily. Use as needed for dry or itchy skin 30 g 2  . valsartan-hydrochlorothiazide (DIOVAN-HCT) 160-25 MG tablet TAKE 1 TABLET EVERY DAY 90 tablet 2   No current facility-administered medications on file prior to visit.     Review of Systems:  As per HPI- otherwise negative. No fever or chills No SOB No further CP    Physical Examination: Vitals:   05/24/18 0959  BP: 126/88  Pulse: 71  Resp: 16  Temp: 98.3 F (36.8 C)  SpO2: 98%   Vitals:   05/24/18 0959  Weight: 226 lb (  102.5 kg)  Height: _0  (1.803 m)   Body mass index is 31.52 kg/m. Ideal Body Weight: Weight in (lb) to have BMI = 25: 178.9  GEN: WDWN, NAD, Non-toxic, A & O x 3, looks well, obese  HEENT: Atraumatic, Normocephalic. Neck supple. No masses, No LAD.  Bilateral TM wnl, oropharynx normal.  PEERL,EOMI.   Ears and Nose: No external deformity. CV: skipping every 3rd beat or more, No M/G/R. No JVD. No thrill. No extra heart sounds. PULM: CTA B, no wheezes, crackles, rhonchi. No retractions. No resp. distress. No accessory muscle use. ABD: S, NT, ND, +BS. No rebound. No HSM. EXTR: No c/c/e NEURO Normal gait.  PSYCH: Normally interactive. Conversant.  Not depressed or anxious appearing.  Calm demeanor.  Feet:  He continues to have fungal involvement of the bilateral great toenails.    EKG:  SR with non specific QRS widening and possible old MI; compared with 3/18,no significnat change noted  Assessment and Plan: Mixed hyperlipidemia  Diabetes mellitus without complication ([place]) - Plan: Hemoglobin P8F, Basic metabolic panel, metFORMIN (GLUCOPHAGE) 500 MG tablet  Essential hypertension - Plan: Basic metabolic panel  History of leukemia  Irregular heart beat - Plan: EKG 12-Lead  Onychomycosis - Plan: Basic metabolic panel, terbinafine (LAMISIL) 250 MG tablet  Episode of CP 2 weeks ago. Now resolved. Message to Dr. Harrington Challenger about this, he will report any other symptoms No arrythmia noted on EKG today  lamisil for his onychomycosis  He mentions HA which he thinks are due to sinus sx. He will try OTC claritin and see if this helps, will let me know if not  Check his A1c today  Refilled metformin He will start to dose metformin BID and stop glipizide   Signed Lamar Blinks, MD  Received his labs, message to pt  Results for orders placed or performed in visit on 05/24/18  Hemoglobin A1c  Result Value Ref Range   Hgb A1c MFr Bld 7.5 (H) 4.6 - 6.5 %  Basic metabolic panel  Result Value Ref Range   Sodium 141 135 - 145 mEq/L   Potassium 3.7 3.5 - 5.1 mEq/L   Chloride 102 96 - 112 mEq/L   CO2 29 19 - 32 mEq/L   Glucose, Bld 138 (H) 70 - 99 mg/dL   BUN 19 6 - 23 mg/dL   Creatinine, Ser 1.00 0.40 - 1.50 mg/dL   Calcium 10.0 8.4 - 10.5 mg/dL   GFR 78.52 >60.00 mL/min

## 2018-05-24 ENCOUNTER — Other Ambulatory Visit: Payer: Medicare HMO

## 2018-05-24 ENCOUNTER — Encounter: Payer: Self-pay | Admitting: Family Medicine

## 2018-05-24 ENCOUNTER — Ambulatory Visit (INDEPENDENT_AMBULATORY_CARE_PROVIDER_SITE_OTHER): Payer: Medicare HMO | Admitting: Family Medicine

## 2018-05-24 VITALS — BP 126/88 | HR 71 | Temp 98.3°F | Resp 16 | Ht 71.0 in | Wt 226.0 lb

## 2018-05-24 DIAGNOSIS — I499 Cardiac arrhythmia, unspecified: Secondary | ICD-10-CM

## 2018-05-24 DIAGNOSIS — E782 Mixed hyperlipidemia: Secondary | ICD-10-CM

## 2018-05-24 DIAGNOSIS — R079 Chest pain, unspecified: Secondary | ICD-10-CM | POA: Diagnosis not present

## 2018-05-24 DIAGNOSIS — B351 Tinea unguium: Secondary | ICD-10-CM

## 2018-05-24 DIAGNOSIS — Z856 Personal history of leukemia: Secondary | ICD-10-CM

## 2018-05-24 DIAGNOSIS — I10 Essential (primary) hypertension: Secondary | ICD-10-CM | POA: Diagnosis not present

## 2018-05-24 DIAGNOSIS — E119 Type 2 diabetes mellitus without complications: Secondary | ICD-10-CM | POA: Diagnosis not present

## 2018-05-24 LAB — BASIC METABOLIC PANEL
BUN: 19 mg/dL (ref 6–23)
CO2: 29 meq/L (ref 19–32)
Calcium: 10 mg/dL (ref 8.4–10.5)
Chloride: 102 mEq/L (ref 96–112)
Creatinine, Ser: 1 mg/dL (ref 0.40–1.50)
GFR: 78.52 mL/min (ref >60.00)
Glucose, Bld: 138 mg/dL — ABNORMAL HIGH (ref 70–99)
Potassium: 3.7 mEq/L (ref 3.5–5.1)
Sodium: 141 mEq/L (ref 135–145)

## 2018-05-24 LAB — HEMOGLOBIN A1C: Hgb A1c MFr Bld: 7.5 % — ABNORMAL HIGH (ref 4.6–6.5)

## 2018-05-24 MED ORDER — METFORMIN HCL 500 MG PO TABS: 500.0000 mg | ORAL_TABLET | Freq: Two times a day (BID) | ORAL | 3 refills | Status: DC

## 2018-05-24 MED ORDER — TERBINAFINE HCL 250 MG PO TABS: 250.0000 mg | ORAL_TABLET | Freq: Every day | ORAL | 0 refills | Status: DC

## 2018-05-24 NOTE — Patient Instructions (Addendum)
I will touch base with Dr. Harrington Challenger about what happened to you a couple of weeks ago Your EKG looks ok, no different from previous  I sent in lamisil for your toenails- take this daily for 3 months  You might try an OTC allergy pill for your headaches- let me know if this is not helpful for your headaches

## 2018-05-26 ENCOUNTER — Encounter: Payer: Self-pay | Admitting: Family Medicine

## 2018-08-02 ENCOUNTER — Encounter: Payer: Self-pay | Admitting: Internal Medicine

## 2018-08-02 ENCOUNTER — Ambulatory Visit: Payer: Medicare HMO | Admitting: Internal Medicine

## 2018-08-02 VITALS — BP 130/72 | HR 61 | Ht 71.0 in | Wt 232.8 lb

## 2018-08-02 DIAGNOSIS — E782 Mixed hyperlipidemia: Secondary | ICD-10-CM

## 2018-08-02 DIAGNOSIS — I10 Essential (primary) hypertension: Secondary | ICD-10-CM

## 2018-08-02 DIAGNOSIS — I2510 Atherosclerotic heart disease of native coronary artery without angina pectoris: Secondary | ICD-10-CM | POA: Diagnosis not present

## 2018-08-02 MED ORDER — EZETIMIBE 10 MG PO TABS: 10.0000 mg | ORAL_TABLET | Freq: Every day | ORAL | 3 refills | Status: DC

## 2018-08-02 MED ORDER — AMLODIPINE BESYLATE 5 MG PO TABS: 5.0000 mg | ORAL_TABLET | Freq: Two times a day (BID) | ORAL | 3 refills | Status: DC

## 2018-08-02 NOTE — Progress Notes (Signed)
Cardiology Office Note   Date:  08/02/2018   ID:  Brandon Stone, DOB [birth year], MRN [id number]  PCP:  Darreld Mclean, MD  Cardiologist:   Dorris Carnes, MD     F/U of CAD    History of Present Illness: Brandon Stone is a 73 y.o. male with a history of CAD (s/p CABG in 1999; [place] in 1998. Redo CABG in 2004 (SVG to OM/LCx; LRA to PDA). Cath in July 2013 due to increased CP showed severe LM disease into Lcx.; SVG to OM 100%; LIMA to Diag and LAD patent; Radial Artery to PDA patent LVEF 45% Plan was to continue medical Rx. 2018    Echo showed LVEF normal  Mild diastolic dysfunciton.   I saw the pt in Jan 2019    Pt seen by J Copland   He told her he was house sitting for a neighbor  and the alarm went off    The patient said he panicked   Had CP   Chest pain relieved with one NTG  Has not had any since At home BP 145=150 /   Higher than in clinc   He bought a manual cuff through his insurance   Again, with this cuff had bp the same, 140s  Admits to not working out  [employer] is 30 min away   Not walking much   Bikes when warmer out   Current Meds  Medication Sig  . acetaminophen (TYLENOL) 325 MG tablet Take 325 mg by mouth every 6 (six) hours as needed for mild pain.  Marland Kitchen allopurinol (ZYLOPRIM) 300 MG tablet Take 1 tablet (300 mg total) by mouth daily.  Marland Kitchen amLODipine (NORVASC) 5 MG tablet Take 1 tablet (5 mg total) by mouth daily.  Marland Kitchen aspirin EC 81 MG tablet Take 81 mg by mouth daily.  Marland Kitchen atorvastatin (LIPITOR) 80 MG tablet TAKE 1 TABLET EVERY DAY  . blood glucose meter kit and supplies Dispense based on patient and insurance preference. Use up to four times daily as directed. (FOR ICD-10 E10.9, E11.9).  Marland Kitchen clopidogrel (PLAVIX) 75 MG tablet TAKE 1 TABLET EVERY DAY  . isosorbide mononitrate (IMDUR) 60 MG 24 hr tablet TAKE 1 TABLET EVERY DAY  . Loperamide HCl (IMODIUM PO) Take 1 tablet by mouth daily as needed (loosse stools).  . loratadine (CLARITIN) 10 MG tablet Take 10 mg by mouth daily  as needed for allergies.  . metFORMIN (GLUCOPHAGE) 500 MG tablet Take 1 tablet (500 mg total) by mouth 2 (two) times daily with a meal.  . metoprolol succinate (TOPROL-XL) 25 MG 24 hr tablet TAKE 1 TABLET TWICE DAILY  . MULTIPLE VITAMIN-FOLIC ACID PO Take 1 tablet by mouth daily. Contains 657 mg folic acid  . Multiple Vitamins-Minerals (MULTIVITAMIN ADULTS PO)   . nitroGLYCERIN (NITROSTAT) 0.4 MG SL tablet Place 1 tablet (0.4 mg total) under the tongue every 5 (five) minutes as needed for chest pain.  . Omega-3 Fatty Acids (FISH OIL) 1200 MG CAPS Take 1 capsule by mouth every evening.   Marland Kitchen omeprazole (PRILOSEC) 20 MG capsule Take 1 capsule (20 mg total) by mouth 2 (two) times daily.  . potassium chloride SA (K-DUR,KLOR-CON) 20 MEQ tablet Take 1 tablet (20 mEq total) by mouth every evening.  . terbinafine (LAMISIL) 250 MG tablet Take 1 tablet (250 mg total) by mouth daily. Take for 3 months for toenail fungus  . TOLNAFTATE ANTIFUNGAL EX Apply 1 application topically as directed.   . triamcinolone cream (KENALOG) 0.1 %  Apply 1 application topically 2 (two) times daily. Use as needed for dry or itchy skin  . valsartan-hydrochlorothiazide (DIOVAN-HCT) 160-25 MG tablet TAKE 1 TABLET EVERY DAY     Allergies:   Other; Ticlopidine hcl; Black pepper [piper]; and Codeine   Past Medical History:  Diagnosis Date  . Anginal pain ([place])   . Arthritis   . Barrett's esophagus   . BPH (benign prostatic hypertrophy)   . CAD (coronary artery disease)     a. CABG 1991;  b.  [place];  c.  S/P PTCA  stents to SVG ot OM last in 2003;  d.  Redo CABG x 2 in 2004 (VG->OM->LCX, LRA->PDA);  e. 12/2011 Cath: 3vd, sev LM into LCX dzs, VG->OM 100, LIMA->DIAG->LAD patent, Rad Art->PDA patent, EF 45%, Med Rx.  . Diverticulosis   . DM (diabetes mellitus) ([place])   . Dyslipidemia   . Dyspnea   . Gallstones   . GERD (gastroesophageal reflux disease)   . Gout   . History of hiatal hernia   . HTN (hypertension)   .  Hyperlipidemia   . Leukemia in remission ([place])   . Psoriasis   . Rheumatic fever    as a child  . S/P coronary artery stent placement 1998  . TIA (transient ischemic attack)    FOUND ON MRI  PER PATIENT-     NO PROBLEM    Past Surgical History:  Procedure Laterality Date  . APPENDECTOMY    . CHOLECYSTECTOMY N/A 09/18/2016   Procedure: LAPAROSCOPIC CHOLECYSTECTOMY WITH INTRAOPERATIVE CHOLANGIOGRAM;  Surgeon: Jackolyn Confer, MD;  Location: [facility];  Service: General;  Laterality: N/A;  . COLONOSCOPY  07/2013  . CORONARY ANGIOPLASTY     STENTS  . CORONARY ARTERY BYPASS GRAFT     1991 (LIMA to LAD/Diag;  SVG to RCA;  SVG to OM)  . CORONARY ARTERY BYPASS GRAFT     2004 (L radial to PDA; SVG to OM1/ distal LCx)  . TONSILLECTOMY AND ADENOIDECTOMY    . UMBILICAL HERNIA REPAIR       Social History:  The patient  reports that he quit smoking about 47 years ago. He quit after 8.00 years of use. He has never used smokeless tobacco. He reports that he does not drink alcohol or use drugs.   Family History:  The patient's family history includes Diabetes in his father and mother; Heart disease in his father and mother; Hyperlipidemia in his father and mother; Hypertension in his father and mother; Skin cancer in his mother; Stroke in his brother and sister.    ROS:  Please see the history of present illness. All other systems are reviewed and  Negative to the above problem except as noted.    PHYSICAL EXAM: VS:  BP 130/72   Pulse 61   Ht _0  (1.803 m)   Wt 232 lb 12.8 oz (105.6 kg)   SpO2 97%   BMI 32.47 kg/m   GEN: Obese 73 yo, in no acute distress  HEENT: normal  Neck: JVP is not elevated    No carotid bruits, or masses Cardiac: RRR; no murmurs, rubs, or gallops,no edema  Respiratory:  clear to auscultation bilaterally, normal work of breathing GI: soft, nontender, distended, + BS  No hepatomegaly  MS: no deformity Moving all extremities   Skin: warm and dry, no rash Neuro:   Strength and sensation are intact Psych: euthymic mood, full affect   EKG:  EKG is not ordered today.  Lipid Panel    Component Value Date/Time   CHOL 159 02/04/2018 0956   CHOL 130 04/08/2017 0813   TRIG 232.0 (H) 02/04/2018 0956   HDL 36.30 (L) 02/04/2018 0956   HDL 34 (L) 04/08/2017 0813   CHOLHDL 4 02/04/2018 0956   VLDL 46.4 (H) 02/04/2018 0956   LDLCALC 61 04/08/2017 0813   LDLDIRECT 94.0 02/04/2018 0956      Wt Readings from Last 3 Encounters:  08/02/18 232 lb 12.8 oz (105.6 kg)  05/24/18 226 lb (102.5 kg)  02/04/18 223 lb (101.2 kg)      ASSESSMENT AND PLAN:  1   CAD No symptmos of angina other than that one spell I encouraged him to increase his activity   Get a trainer for his bike     2  HTN   Increase amlodipine to 5 mg bid   Call in with BP   When comes for lipids will get cuff calibrated    3  HL  Keep on lipitor  Add Zetia    He had been on this in 2018 with good response   At the time inusrance did not want to pay for it   Will petition again   If does not will need to find out about Repatha  Check lipids in 2 months    4  Obesityh  Discussed diet and exercise  Coming down in wt would help BP  5  Heme  Has appt later this month with  P Ennever    Will get CBC at that time   Continue plavix for now   Current medicines are reviewed at length with the patient today.  The patient does not have concerns regarding medicines.  Signed, Dorris Carnes, MD  08/02/2018 8:24 AM    [facility] [place], [place], [place]  [address] Phone: [phone]; Fax: [phone]

## 2018-08-02 NOTE — Patient Instructions (Signed)
Medication Instructions:  Your physician has recommended you make the following change in your medication:  1.) increase amlodipine to 5 mg twice a day for blood pressure 2.) start ezetimibe (Zetia) 10 mg once a day for cholesterol  If you need a refill on your cardiac medications before your next appointment, please call your pharmacy.   Lab work: In about 2-3 months --lipids--we will call you to schedule this.  If you have labs (blood work) drawn today and your tests are completely normal, you will receive your results only by: Marland Kitchen MyChart Message (if you have MyChart) OR . A paper copy in the mail If you have any lab test that is abnormal or we need to change your treatment, we will call you to review the results.  Testing/Procedures: none  Follow-Up: At [facility], you and your health needs are our priority.  As part of our continuing mission to provide you with exceptional heart care, we have created designated Provider Care Teams.  These Care Teams include your primary Cardiologist (physician) and Advanced Practice Providers (APPs -  Physician Assistants and Nurse Practitioners) who all work together to provide you with the care you need, when you need it. You will need a follow up appointment in:  12 months.  Please call our office 2 months in advance to schedule this appointment.  You may see Dorris Carnes, MD or one of the following Advanced Practice Providers on your designated Care Team: Richardson Dopp, PA-C [place], [place] . Daune Perch, NP  Any Other Special Instructions Will Be Listed Below (If Applicable).

## 2018-08-09 ENCOUNTER — Other Ambulatory Visit: Payer: Self-pay | Admitting: Internal Medicine

## 2018-08-16 ENCOUNTER — Inpatient Hospital Stay: Payer: Medicare HMO | Attending: Hematology & Oncology | Admitting: Hematology & Oncology

## 2018-08-16 ENCOUNTER — Encounter: Payer: Self-pay | Admitting: Hematology & Oncology

## 2018-08-16 ENCOUNTER — Other Ambulatory Visit: Payer: Self-pay

## 2018-08-16 ENCOUNTER — Inpatient Hospital Stay: Payer: Medicare HMO

## 2018-08-16 VITALS — BP 133/76 | HR 81 | Temp 98.2°F | Resp 16 | Wt 225.2 lb

## 2018-08-16 DIAGNOSIS — C9110 Chronic lymphocytic leukemia of B-cell type not having achieved remission: Secondary | ICD-10-CM

## 2018-08-16 DIAGNOSIS — Z79899 Other long term (current) drug therapy: Secondary | ICD-10-CM

## 2018-08-16 DIAGNOSIS — Z7984 Long term (current) use of oral hypoglycemic drugs: Secondary | ICD-10-CM | POA: Diagnosis not present

## 2018-08-16 DIAGNOSIS — Z7982 Long term (current) use of aspirin: Secondary | ICD-10-CM | POA: Diagnosis not present

## 2018-08-16 LAB — CBC WITH DIFFERENTIAL (CANCER CENTER ONLY)
Abs Immature Granulocytes: 0.03 10*3/uL (ref 0.00–0.07)
BASOS PCT: 0 %
Basophils Absolute: 0 10*3/uL (ref 0.0–0.1)
Eosinophils Absolute: 0.1 10*3/uL (ref 0.0–0.5)
Eosinophils Relative: 1 %
HCT: 43.2 % (ref 39.0–52.0)
Hemoglobin: 14.7 g/dL (ref 13.0–17.0)
Immature Granulocytes: 0 %
Lymphocytes Relative: 53 %
Lymphs Abs: 6.6 10*3/uL — ABNORMAL HIGH (ref 0.7–4.0)
MCH: 31.5 pg (ref 26.0–34.0)
MCHC: 34 g/dL (ref 30.0–36.0)
MCV: 92.7 fL (ref 80.0–100.0)
Monocytes Absolute: 0.8 10*3/uL (ref 0.1–1.0)
Monocytes Relative: 6 %
NRBC: 0 % (ref 0.0–0.2)
Neutro Abs: 5.1 10*3/uL (ref 1.7–7.7)
Neutrophils Relative %: 40 %
Platelet Count: 211 10*3/uL (ref 150–400)
RBC: 4.66 MIL/uL (ref 4.22–5.81)
RDW: 12.5 % (ref 11.5–15.5)
WBC Count: 12.7 10*3/uL — ABNORMAL HIGH (ref 4.0–10.5)

## 2018-08-16 LAB — CMP (CANCER CENTER ONLY)
ALT: 24 U/L (ref 0–44)
AST: 20 U/L (ref 15–41)
Albumin: 4.7 g/dL (ref 3.5–5.0)
Alkaline Phosphatase: 70 U/L (ref 38–126)
Anion gap: 9 (ref 5–15)
BUN: 22 mg/dL (ref 8–23)
CO2: 31 mmol/L (ref 22–32)
Calcium: 9.7 mg/dL (ref 8.9–10.3)
Chloride: 103 mmol/L (ref 98–111)
Creatinine: 1.11 mg/dL (ref 0.61–1.24)
GFR, Est AFR Am: >60 mL/min (ref >60)
GFR, Estimated: >60 mL/min (ref >60)
Glucose, Bld: 174 mg/dL — ABNORMAL HIGH (ref 70–99)
Potassium: 4 mmol/L (ref 3.5–5.1)
Sodium: 143 mmol/L (ref 135–145)
Total Bilirubin: 0.8 mg/dL (ref 0.3–1.2)
Total Protein: 6.3 g/dL — ABNORMAL LOW (ref 6.5–8.1)

## 2018-08-16 LAB — IRON AND TIBC
Iron: 91 ug/dL (ref 42–163)
Saturation Ratios: 26 % (ref 20–55)
TIBC: 354 ug/dL (ref 202–409)
UIBC: 263 ug/dL (ref 117–376)

## 2018-08-16 LAB — SAVE SMEAR (SSMR)

## 2018-08-16 LAB — FERRITIN: FERRITIN: 80 ng/mL (ref 24–336)

## 2018-08-16 NOTE — Progress Notes (Signed)
Hematology and Oncology Follow Up Visit  Mylz Yuan [id number] 05-16-1946 73 y.o. 08/16/2018   Principle Diagnosis:   Stage A CLL  Current Therapy:    Observation     Interim History:  Mr. Stille is back for follow-up.  We saw him 8 months ago.  Everything is going quite well for him.  He is doing a lot of woodworking.  He truly enjoys this.  He has had no problems with nausea or vomiting.  He has had no change in bowel or bladder habits.  His blood sugars have been on the high side.  This I think is his biggest clinical problem.  He has had no rashes.  There is been no leg swelling.  He has had no cough or shortness of breath.  He has had no headaches.  Overall, his performance status is ECOG 1.  Medications:  Current Outpatient Medications:  .  acetaminophen (TYLENOL) 325 MG tablet, Take 325 mg by mouth every 6 (six) hours as needed for mild pain., Disp: , Rfl:  .  allopurinol (ZYLOPRIM) 300 MG tablet, Take 1 tablet (300 mg total) by mouth daily., Disp: 90 tablet, Rfl: 3 .  amLODipine (NORVASC) 5 MG tablet, Take 1 tablet (5 mg total) by mouth 2 (two) times daily., Disp: 180 tablet, Rfl: 3 .  aspirin EC 81 MG tablet, Take 81 mg by mouth daily., Disp: , Rfl:  .  atorvastatin (LIPITOR) 80 MG tablet, TAKE 1 TABLET EVERY DAY, Disp: 90 tablet, Rfl: 2 .  blood glucose meter kit and supplies, Dispense based on patient and insurance preference. Use up to four times daily as directed. (FOR ICD-10 E10.9, E11.9)., Disp: 1 each, Rfl: 0 .  clopidogrel (PLAVIX) 75 MG tablet, TAKE 1 TABLET EVERY DAY, Disp: 90 tablet, Rfl: 2 .  ezetimibe (ZETIA) 10 MG tablet, Take 1 tablet (10 mg total) by mouth daily., Disp: 90 tablet, Rfl: 3 .  isosorbide mononitrate (IMDUR) 60 MG 24 hr tablet, TAKE 1 TABLET EVERY DAY, Disp: 90 tablet, Rfl: 3 .  Loperamide HCl (IMODIUM PO), Take 1 tablet by mouth daily as needed (loosse stools)., Disp: , Rfl:  .  loratadine (CLARITIN) 10 MG tablet, Take 10 mg by mouth daily  as needed for allergies., Disp: , Rfl:  .  metFORMIN (GLUCOPHAGE) 500 MG tablet, Take 1 tablet (500 mg total) by mouth 2 (two) times daily with a meal., Disp: 180 tablet, Rfl: 3 .  metoprolol succinate (TOPROL-XL) 25 MG 24 hr tablet, TAKE 1 TABLET TWICE DAILY, Disp: 180 tablet, Rfl: 2 .  MULTIPLE VITAMIN-FOLIC ACID PO, Take 1 tablet by mouth daily. Contains 841 mg folic acid, Disp: , Rfl:  .  Multiple Vitamins-Minerals (MULTIVITAMIN ADULTS PO), , Disp: , Rfl:  .  nitroGLYCERIN (NITROSTAT) 0.4 MG SL tablet, Place 1 tablet (0.4 mg total) under the tongue every 5 (five) minutes as needed for chest pain., Disp: 25 tablet, Rfl: 3 .  Omega-3 Fatty Acids (FISH OIL) 1200 MG CAPS, Take 1 capsule by mouth every evening. , Disp: , Rfl:  .  omeprazole (PRILOSEC) 20 MG capsule, Take 1 capsule (20 mg total) by mouth 2 (two) times daily., Disp: 180 capsule, Rfl: 3 .  potassium chloride SA (K-DUR,KLOR-CON) 20 MEQ tablet, Take 1 tablet (20 mEq total) by mouth every evening., Disp: 90 tablet, Rfl: 1 .  terbinafine (LAMISIL) 250 MG tablet, Take 1 tablet (250 mg total) by mouth daily. Take for 3 months for toenail fungus, Disp: 90 tablet, Rfl: 0 .  TOLNAFTATE ANTIFUNGAL EX, Apply 1 application topically as directed. , Disp: , Rfl:  .  triamcinolone cream (KENALOG) 0.1 %, Apply 1 application topically 2 (two) times daily. Use as needed for dry or itchy skin, Disp: 30 g, Rfl: 2 .  valsartan-hydrochlorothiazide (DIOVAN-HCT) 160-25 MG tablet, TAKE 1 TABLET EVERY DAY, Disp: 90 tablet, Rfl: 3  Allergies:  Allergies  Allergen Reactions  . Other Hives    Intolerance to strong pain medications  Rash to opsite and tegaderm tape.  . Ticlopidine Hcl Hives and Itching  . Black Pepper [[place]] Palpitations    HEART PALPITATIONS HEART PALPITATIONS  . Codeine Palpitations and Other (See Comments)    Wild dreams, palpitations "wild dreams"    Past Medical History, Surgical history, Social history, and Family History were  reviewed and updated.  Review of Systems: Review of Systems  Constitutional: Negative.   HENT:  Negative.   Eyes: Negative.   Respiratory: Negative.   Cardiovascular: Negative.   Gastrointestinal: Negative.   Endocrine: Negative.   Genitourinary: Negative.    Musculoskeletal: Negative.   Skin: Negative.   Neurological: Negative.   Hematological: Negative.   Psychiatric/Behavioral: Negative.     Physical Exam:  weight is 225 lb 4 oz (102.2 kg). His oral temperature is 98.2 F (36.8 C). His blood pressure is 133/76 and his pulse is 81. His respiration is 16 and oxygen saturation is 100%.   Wt Readings from Last 3 Encounters:  08/16/18 225 lb 4 oz (102.2 kg)  08/02/18 232 lb 12.8 oz (105.6 kg)  05/24/18 226 lb (102.5 kg)    Physical Exam Vitals signs reviewed.  HENT:     Head: Normocephalic and atraumatic.  Eyes:     Pupils: Pupils are equal, round, and reactive to light.  Neck:     Musculoskeletal: Normal range of motion.  Cardiovascular:     Rate and Rhythm: Normal rate and regular rhythm.     Heart sounds: Normal heart sounds.  Pulmonary:     Effort: Pulmonary effort is normal.     Breath sounds: Normal breath sounds.  Abdominal:     General: Bowel sounds are normal.     Palpations: Abdomen is soft.  Musculoskeletal: Normal range of motion.        General: No tenderness or deformity.  Lymphadenopathy:     Cervical: No cervical adenopathy.  Skin:    General: Skin is warm and dry.     Findings: No erythema or rash.  Neurological:     Mental Status: He is alert and oriented to person, place, and time.  Psychiatric:        Behavior: Behavior normal.        Thought Content: Thought content normal.        Judgment: Judgment normal.      Lab Results  Component Value Date   WBC 12.7 (H) 08/16/2018   HGB 14.7 08/16/2018   HCT 43.2 08/16/2018   MCV 92.7 08/16/2018   PLT 211 08/16/2018     Chemistry      Component Value Date/Time   NA 143 08/16/2018 0843    NA 143 06/11/2017 1044   NA 140 04/27/2014 0815   K 4.0 08/16/2018 0843   K 3.2 (L) 06/11/2017 1044   K 3.7 04/27/2014 0815   CL 103 08/16/2018 0843   CL 101 06/11/2017 1044   CO2 31 08/16/2018 0843   CO2 28 06/11/2017 1044   CO2 26 04/27/2014 0815   BUN 22 08/16/2018  0843   BUN 17 06/11/2017 1044   BUN 18.5 04/27/2014 0815   CREATININE 1.11 08/16/2018 0843   CREATININE 1.1 06/11/2017 1044   CREATININE 1.2 04/27/2014 0815      Component Value Date/Time   CALCIUM 9.7 08/16/2018 0843   CALCIUM 9.7 06/11/2017 1044   CALCIUM 9.8 04/27/2014 0815   ALKPHOS 70 08/16/2018 0843   ALKPHOS 83 06/11/2017 1044   ALKPHOS 71 04/27/2014 0815   AST 20 08/16/2018 0843   AST 18 04/27/2014 0815   ALT 24 08/16/2018 0843   ALT 32 06/11/2017 1044   ALT 25 04/27/2014 0815   BILITOT 0.8 08/16/2018 0843   BILITOT 0.67 04/27/2014 0815       Impression and Plan: Mr. Bollig is a 73 year old white male.  He has stage A CLL.  He has minimal lymphocytosis.  So far, everything is looking quite stable.  His white cell count is up just a little bit but I am not really worried about this as his white cell count was slightly lower a year ago.  We will still plan for follow-up in 8 months.     Volanda Napoleon, MD 2/24/202010:11 AM

## 2018-09-08 ENCOUNTER — Emergency Department (HOSPITAL_COMMUNITY): Payer: Medicare HMO

## 2018-09-08 ENCOUNTER — Other Ambulatory Visit: Payer: Self-pay

## 2018-09-08 ENCOUNTER — Inpatient Hospital Stay (HOSPITAL_COMMUNITY): Payer: Medicare HMO

## 2018-09-08 ENCOUNTER — Encounter (HOSPITAL_COMMUNITY): Payer: Self-pay | Admitting: Emergency Medicine

## 2018-09-08 ENCOUNTER — Inpatient Hospital Stay (HOSPITAL_COMMUNITY)
Admission: EM | Admit: 2018-09-08 | Discharge: 2018-09-10 | DRG: 083 | Disposition: A | Discharge status: Home / Self Care | Payer: Medicare HMO | Attending: Neurological Surgery | Admitting: Neurological Surgery

## 2018-09-08 DIAGNOSIS — C9591 Leukemia, unspecified, in remission: Secondary | ICD-10-CM | POA: Diagnosis not present

## 2018-09-08 DIAGNOSIS — Z833 Family history of diabetes mellitus: Secondary | ICD-10-CM

## 2018-09-08 DIAGNOSIS — E119 Type 2 diabetes mellitus without complications: Secondary | ICD-10-CM | POA: Diagnosis present

## 2018-09-08 DIAGNOSIS — Z79899 Other long term (current) drug therapy: Secondary | ICD-10-CM

## 2018-09-08 DIAGNOSIS — Z8249 Family history of ischemic heart disease and other diseases of the circulatory system: Secondary | ICD-10-CM

## 2018-09-08 DIAGNOSIS — Y9279 Other farm location as the place of occurrence of the external cause: Secondary | ICD-10-CM

## 2018-09-08 DIAGNOSIS — Z7952 Long term (current) use of systemic steroids: Secondary | ICD-10-CM

## 2018-09-08 DIAGNOSIS — I2510 Atherosclerotic heart disease of native coronary artery without angina pectoris: Secondary | ICD-10-CM | POA: Diagnosis not present

## 2018-09-08 DIAGNOSIS — N400 Enlarged prostate without lower urinary tract symptoms: Secondary | ICD-10-CM | POA: Diagnosis present

## 2018-09-08 DIAGNOSIS — Z823 Family history of stroke: Secondary | ICD-10-CM

## 2018-09-08 DIAGNOSIS — Z7984 Long term (current) use of oral hypoglycemic drugs: Secondary | ICD-10-CM | POA: Diagnosis not present

## 2018-09-08 DIAGNOSIS — S065XAA Traumatic subdural hemorrhage with loss of consciousness status unknown, initial encounter: Secondary | ICD-10-CM | POA: Diagnosis present

## 2018-09-08 DIAGNOSIS — Z7982 Long term (current) use of aspirin: Secondary | ICD-10-CM

## 2018-09-08 DIAGNOSIS — S065X9A Traumatic subdural hemorrhage with loss of consciousness of unspecified duration, initial encounter: Secondary | ICD-10-CM | POA: Diagnosis not present

## 2018-09-08 DIAGNOSIS — Z8673 Personal history of transient ischemic attack (TIA), and cerebral infarction without residual deficits: Secondary | ICD-10-CM | POA: Diagnosis not present

## 2018-09-08 DIAGNOSIS — R51 Headache: Secondary | ICD-10-CM | POA: Diagnosis not present

## 2018-09-08 DIAGNOSIS — I10 Essential (primary) hypertension: Secondary | ICD-10-CM | POA: Diagnosis not present

## 2018-09-08 DIAGNOSIS — S065X0A Traumatic subdural hemorrhage without loss of consciousness, initial encounter: Secondary | ICD-10-CM | POA: Diagnosis not present

## 2018-09-08 DIAGNOSIS — Z955 Presence of coronary angioplasty implant and graft: Secondary | ICD-10-CM | POA: Diagnosis not present

## 2018-09-08 DIAGNOSIS — Z7902 Long term (current) use of antithrombotics/antiplatelets: Secondary | ICD-10-CM | POA: Diagnosis not present

## 2018-09-08 DIAGNOSIS — W11XXXA Fall on and from ladder, initial encounter: Secondary | ICD-10-CM | POA: Diagnosis present

## 2018-09-08 DIAGNOSIS — Y93H9 Activity, other involving exterior property and land maintenance, building and construction: Secondary | ICD-10-CM

## 2018-09-08 DIAGNOSIS — Z951 Presence of aortocoronary bypass graft: Secondary | ICD-10-CM | POA: Diagnosis not present

## 2018-09-08 DIAGNOSIS — E785 Hyperlipidemia, unspecified: Secondary | ICD-10-CM | POA: Diagnosis not present

## 2018-09-08 DIAGNOSIS — Z87891 Personal history of nicotine dependence: Secondary | ICD-10-CM | POA: Diagnosis not present

## 2018-09-08 DIAGNOSIS — K219 Gastro-esophageal reflux disease without esophagitis: Secondary | ICD-10-CM | POA: Diagnosis present

## 2018-09-08 DIAGNOSIS — E1165 Type 2 diabetes mellitus with hyperglycemia: Secondary | ICD-10-CM | POA: Diagnosis not present

## 2018-09-08 DIAGNOSIS — S0990XA Unspecified injury of head, initial encounter: Secondary | ICD-10-CM | POA: Diagnosis not present

## 2018-09-08 DIAGNOSIS — S299XXA Unspecified injury of thorax, initial encounter: Secondary | ICD-10-CM | POA: Diagnosis not present

## 2018-09-08 DIAGNOSIS — M542 Cervicalgia: Secondary | ICD-10-CM

## 2018-09-08 LAB — CBC WITH DIFFERENTIAL/PLATELET
ABS IMMATURE GRANULOCYTES: 0.06 10*3/uL (ref 0.00–0.07)
BASOS PCT: 0 %
Basophils Absolute: 0 10*3/uL (ref 0.0–0.1)
Eosinophils Absolute: 0.1 10*3/uL (ref 0.0–0.5)
Eosinophils Relative: 0 %
HCT: 42.6 % (ref 39.0–52.0)
HEMOGLOBIN: 14.4 g/dL (ref 13.0–17.0)
Immature Granulocytes: 0 %
Lymphocytes Relative: 40 %
Lymphs Abs: 5.4 10*3/uL — ABNORMAL HIGH (ref 0.7–4.0)
MCH: 30.3 pg (ref 26.0–34.0)
MCHC: 33.8 g/dL (ref 30.0–36.0)
MCV: 89.5 fL (ref 80.0–100.0)
Monocytes Absolute: 0.9 10*3/uL (ref 0.1–1.0)
Monocytes Relative: 7 %
Neutro Abs: 7.2 10*3/uL (ref 1.7–7.7)
Neutrophils Relative %: 53 %
PLATELETS: 211 10*3/uL (ref 150–400)
RBC: 4.76 MIL/uL (ref 4.22–5.81)
RDW: 12.4 % (ref 11.5–15.5)
WBC: 13.6 10*3/uL — AB (ref 4.0–10.5)
nRBC: 0 % (ref 0.0–0.2)

## 2018-09-08 LAB — BASIC METABOLIC PANEL
Anion gap: 8 (ref 5–15)
BUN: 14 mg/dL (ref 8–23)
CHLORIDE: 104 mmol/L (ref 98–111)
CO2: 25 mmol/L (ref 22–32)
Calcium: 9.6 mg/dL (ref 8.9–10.3)
Creatinine, Ser: 1.05 mg/dL (ref 0.61–1.24)
GFR calc Af Amer: >60 mL/min (ref >60)
GFR calc non Af Amer: >60 mL/min (ref >60)
Glucose, Bld: 181 mg/dL — ABNORMAL HIGH (ref 70–99)
POTASSIUM: 3.3 mmol/L — AB (ref 3.5–5.1)
SODIUM: 137 mmol/L (ref 135–145)

## 2018-09-08 LAB — CBG MONITORING, ED: Glucose-Capillary: 169 mg/dL — ABNORMAL HIGH (ref 70–99)

## 2018-09-08 MED ORDER — HYDROCHLOROTHIAZIDE 25 MG PO TABS
25.0000 mg | ORAL_TABLET | Freq: Every day | ORAL | Status: DC
  Administered 2018-09-09: 25 mg via ORAL
  Filled 2018-09-08 (×2): qty 1

## 2018-09-08 MED ORDER — POTASSIUM CHLORIDE CRYS ER 20 MEQ PO TBCR
20.0000 meq | EXTENDED_RELEASE_TABLET | Freq: Every evening | ORAL | Status: DC
  Administered 2018-09-08 – 2018-09-09 (×2): 20 meq via ORAL
  Filled 2018-09-08 (×2): qty 1

## 2018-09-08 MED ORDER — VALSARTAN-HYDROCHLOROTHIAZIDE 160-25 MG PO TABS: 1.0000 | ORAL_TABLET | Freq: Every day | ORAL | Status: DC

## 2018-09-08 MED ORDER — TRAMADOL HCL 50 MG PO TABS: 50.0000 mg | ORAL_TABLET | Freq: Four times a day (QID) | ORAL | Status: DC | PRN

## 2018-09-08 MED ORDER — MORPHINE SULFATE (PF) 2 MG/ML IV SOLN: 2.0000 mg | INTRAVENOUS | Status: DC | PRN

## 2018-09-08 MED ORDER — METFORMIN HCL 500 MG PO TABS
500.0000 mg | ORAL_TABLET | Freq: Two times a day (BID) | ORAL | Status: DC
  Administered 2018-09-09 (×2): 500 mg via ORAL
  Filled 2018-09-08 (×3): qty 1

## 2018-09-08 MED ORDER — ACETAMINOPHEN 325 MG PO TABS
650.0000 mg | ORAL_TABLET | Freq: Once | ORAL | Status: AC
  Administered 2018-09-08: 650 mg via ORAL
  Filled 2018-09-08: qty 2

## 2018-09-08 MED ORDER — ACETAMINOPHEN 325 MG PO TABS
650.0000 mg | ORAL_TABLET | ORAL | Status: DC | PRN
  Administered 2018-09-09 – 2018-09-10 (×5): 650 mg via ORAL
  Filled 2018-09-08 (×5): qty 2

## 2018-09-08 MED ORDER — IRBESARTAN 150 MG PO TABS
150.0000 mg | ORAL_TABLET | Freq: Every day | ORAL | Status: DC
  Administered 2018-09-09: 150 mg via ORAL
  Filled 2018-09-08 (×2): qty 1

## 2018-09-08 MED ORDER — ONDANSETRON HCL 4 MG/2ML IJ SOLN: 4.0000 mg | Freq: Four times a day (QID) | INTRAMUSCULAR | Status: DC | PRN

## 2018-09-08 MED ORDER — PANTOPRAZOLE SODIUM 40 MG PO TBEC
40.0000 mg | DELAYED_RELEASE_TABLET | Freq: Every day | ORAL | Status: DC
  Administered 2018-09-09: 40 mg via ORAL
  Filled 2018-09-08 (×2): qty 1

## 2018-09-08 MED ORDER — SODIUM CHLORIDE 0.9 % IV SOLN
INTRAVENOUS | Status: DC
  Administered 2018-09-08 – 2018-09-09 (×3): via INTRAVENOUS
  Filled 2018-09-08 (×4): qty 1000

## 2018-09-08 MED ORDER — AMLODIPINE BESYLATE 5 MG PO TABS
5.0000 mg | ORAL_TABLET | Freq: Two times a day (BID) | ORAL | Status: DC
  Administered 2018-09-08 – 2018-09-09 (×3): 5 mg via ORAL
  Filled 2018-09-08 (×4): qty 1

## 2018-09-08 MED ORDER — METOPROLOL SUCCINATE ER 25 MG PO TB24
25.0000 mg | ORAL_TABLET | Freq: Two times a day (BID) | ORAL | Status: DC
  Administered 2018-09-08 – 2018-09-09 (×3): 25 mg via ORAL
  Filled 2018-09-08 (×4): qty 1

## 2018-09-08 MED ORDER — ONDANSETRON HCL 4 MG PO TABS: 4.0000 mg | ORAL_TABLET | Freq: Four times a day (QID) | ORAL | Status: DC | PRN

## 2018-09-08 MED ORDER — EZETIMIBE 10 MG PO TABS
10.0000 mg | ORAL_TABLET | Freq: Every day | ORAL | Status: DC
  Administered 2018-09-08 – 2018-09-09 (×2): 10 mg via ORAL
  Filled 2018-09-08 (×3): qty 1

## 2018-09-08 MED ORDER — ISOSORBIDE MONONITRATE ER 60 MG PO TB24
60.0000 mg | ORAL_TABLET | Freq: Every day | ORAL | Status: DC
  Administered 2018-09-09: 60 mg via ORAL
  Filled 2018-09-08 (×2): qty 1

## 2018-09-08 MED ORDER — ALLOPURINOL 300 MG PO TABS
300.0000 mg | ORAL_TABLET | Freq: Every day | ORAL | Status: DC
  Administered 2018-09-09: 300 mg via ORAL
  Filled 2018-09-08 (×2): qty 1

## 2018-09-08 NOTE — ED Provider Notes (Signed)
[facility] EMERGENCY DEPARTMENT Provider Note   CSN: [id number] Arrival date & time: 09/08/18  1254    History   Chief Complaint Chief Complaint  Patient presents with   Fall    HPI Brandon Stone is a 73 y.o. male presents in bed presenting today after fall from ladder.  Patient reports that he was cleaning [place] lo[place] his [place] when the ladder slipped out from under him causing him to fall backwards.  Patient states that he did lose consciousness during this fall and that the next thing he remembers after the fall was standing over the ladder.  Patient was confused as to his location after the fall however so his memory returned and he called his wife who encouraged him to call 911.  Patient does take Plavix daily.  Patient's main complaint today is neck pain and headache following his fall.  Patient was placed in c-collar by EMS and brought to this emergency department.  Patient reports that initially after his fall he was having left-sided neck pain which is slowly transitioned over to the right side of his neck after c-collar was placed.  He describes it as a moderate intensity aching throb constant and worsened with movement.  Patient denies any numbness weakness or tingling.  Patient does endorse a right-sided headache as well that moderate throbbing.  Patient denies any vision changes, nausea/vomiting.  Patient's other concern today involves a abrasion to the left lower leg.  Patient denies any other injury or pain today.  Denies back pain, chest pain, abdominal pain, nausea/vomiting, recent fever or illness, pain to the extremities or hips.    HPI  Past Medical History:  Diagnosis Date   Anginal pain ([place])    Arthritis    Barrett's esophagus    BPH (benign prostatic hypertrophy)    CAD (coronary artery disease)     a. CABG 1991;  b.  [place];  c.  S/P PTCA  stents to SVG ot OM last in 2003;  d.  Redo CABG x 2 in 2004 (VG->OM->LCX, LRA->PDA);  e. 12/2011  Cath: 3vd, sev LM into LCX dzs, VG->OM 100, LIMA->DIAG->LAD patent, Rad Art->PDA patent, EF 45%, Med Rx.   Diverticulosis    DM (diabetes mellitus) ([place])    Dyslipidemia    Dyspnea    Gallstones    GERD (gastroesophageal reflux disease)    Gout    History of hiatal hernia    HTN (hypertension)    Hyperlipidemia    Leukemia in remission ([place])    Psoriasis    Rheumatic fever    as a child   S/P coronary artery stent placement 1998   TIA (transient ischemic attack)    FOUND ON MRI  PER PATIENT-     NO PROBLEM    Patient Active Problem List   Diagnosis Date Noted   Subdural hematoma ([place]) 09/08/2018   Diabetes mellitus without complication ([place]) 62/94/7654   Symptomatic cholelithiasis 09/18/2016   Chronic lymphocytic leukemia (CLL), 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[place]) 05/13/2014   Stable angina ([place]) 01/26/2012   Sore throat 11/03/2010   Hyperlipidemia 06/21/2008   HYPERTENSION, BENIGN 06/21/2008   CAD, NATIVE VESSEL 06/21/2008    Past Surgical History:  Procedure Laterality Date   APPENDECTOMY     CHOLECYSTECTOMY N/A 09/18/2016   Procedure: LAPAROSCOPIC CHOLECYSTECTOMY WITH INTRAOPERATIVE CHOLANGIOGRAM;  Surgeon: Jackolyn Confer, MD;  Location: [place];  Service: General;  Laterality: N/A;   COLONOSCOPY  07/2013   CORONARY ANGIOPLASTY  STENTS   CORONARY ARTERY BYPASS GRAFT     1991 (LIMA to LAD/Diag;  SVG to RCA;  SVG to OM)   CORONARY ARTERY BYPASS GRAFT     2004 (L radial to PDA; SVG to OM1/ distal LCx)   TONSILLECTOMY AND ADENOIDECTOMY     UMBILICAL HERNIA REPAIR          Home Medications    Prior to Admission medications   Medication Sig Start Date End Date Taking? Authorizing Provider  acetaminophen (TYLENOL) 325 MG tablet Take 325 mg by mouth every 6 (six) hours as needed for mild pain.   Yes [provider]  allopurinol (ZYLOPRIM) 300 MG tablet Take 1 tablet (300 mg total) by mouth daily. 08/13/17  Yes Copland, Gay Filler, MD  amLODipine  (NORVASC) 5 MG tablet Take 1 tablet (5 mg total) by mouth 2 (two) times daily. 08/02/18  Yes Fay Records, MD  aspirin EC 81 MG tablet Take 81 mg by mouth daily.   Yes [provider]  atorvastatin (LIPITOR) 80 MG tablet TAKE 1 TABLET EVERY DAY 11/18/17  Yes Fay Records, MD  clopidogrel (PLAVIX) 75 MG tablet TAKE 1 TABLET EVERY DAY 11/18/17  Yes Fay Records, MD  ezetimibe (ZETIA) 10 MG tablet Take 1 tablet (10 mg total) by mouth daily. 08/02/18  Yes Fay Records, MD  isosorbide mononitrate (IMDUR) 60 MG 24 hr tablet TAKE 1 TABLET EVERY DAY 08/10/18  Yes Fay Records, MD  Loperamide HCl (IMODIUM PO) Take 1 tablet by mouth daily as needed (loosse stools).   Yes [provider]  loratadine (CLARITIN) 10 MG tablet Take 10 mg by mouth daily as needed for allergies.   Yes [provider]  metFORMIN (GLUCOPHAGE) 500 MG tablet Take 1 tablet (500 mg total) by mouth 2 (two) times daily with a meal. 05/24/18  Yes Copland, Gay Filler, MD  metoprolol succinate (TOPROL-XL) 25 MG 24 hr tablet TAKE 1 TABLET TWICE DAILY 11/18/17  Yes Fay Records, MD  MULTIPLE VITAMIN-FOLIC ACID PO Take 1 tablet by mouth daily. Contains 299 mg folic acid   Yes [provider]  nitroGLYCERIN (NITROSTAT) 0.4 MG SL tablet Place 1 tablet (0.4 mg total) under the tongue every 5 (five) minutes as needed for chest pain. 09/01/17  Yes Fay Records, MD  Omega-3 Fatty Acids (FISH OIL) 1200 MG CAPS Take 1 capsule by mouth every evening.    Yes [provider]  omeprazole (PRILOSEC) 20 MG capsule Take 1 capsule (20 mg total) by mouth 2 (two) times daily. 09/04/17  Yes Copland, Gay Filler, MD  potassium chloride SA (K-DUR,KLOR-CON) 20 MEQ tablet Take 1 tablet (20 mEq total) by mouth every evening. 11/25/17  Yes Copland, Gay Filler, MD  triamcinolone cream (KENALOG) 0.1 % Apply 1 application topically 2 (two) times daily. Use as needed for dry or itchy skin 02/05/17  Yes Copland, Gay Filler, MD    valsartan-hydrochlorothiazide (DIOVAN-HCT) 160-25 MG tablet TAKE 1 TABLET EVERY DAY 08/10/18  Yes Fay Records, MD  blood glucose meter kit and supplies Dispense based on patient and insurance preference. Use up to four times daily as directed. (FOR ICD-10 E10.9, E11.9). 05/13/18   Copland, Gay Filler, MD  terbinafine (LAMISIL) 250 MG tablet Take 1 tablet (250 mg total) by mouth daily. Take for 3 months for toenail fungus Patient not taking: Reported on 09/08/2018 05/24/18   Copland, Gay Filler, MD    Family History Family History  Problem  Relation Age of Onset   Heart disease Mother    Hypertension Mother    Hyperlipidemia Mother    Diabetes Mother    Skin cancer Mother        skin   Heart disease Father    Hypertension Father    Hyperlipidemia Father    Diabetes Father    Stroke Sister    Stroke Brother    Colon cancer Neg Hx     Social History Social History   Tobacco Use   Smoking status: Former Smoker    Years: 8.00    Last attempt to quit: 06/24/1971    Years since quitting: 47.2   Smokeless tobacco: Never Used  Substance Use Topics   Alcohol use: No   Drug use: No     Allergies   Other; Ticlopidine hcl; Black pepper [piper]; and Codeine   Review of Systems Review of Systems  Constitutional: Negative for chills and fever.  Eyes: Negative.  Negative for visual disturbance.  Respiratory: Negative.  Negative for shortness of breath.   Cardiovascular: Negative.  Negative for chest pain.  Gastrointestinal: Negative.  Negative for abdominal pain, nausea and vomiting.  Musculoskeletal: Positive for neck pain. Negative for arthralgias, back pain and myalgias.  Skin: Positive for wound (Left lower leg).  Neurological: Positive for syncope and headaches. Negative for dizziness, speech difficulty, weakness and numbness.       Denies saddle or paresthesias Denies bowel/bladder incontinence  All other systems reviewed and are negative.  Physical  Exam Updated Vital Signs BP 130/76    Pulse 69    Temp 97.7 F (36.5 C) (Oral)    Resp 15    Ht _0  (1.803 m)    Wt 99.8 kg    SpO2 96%    BMI 30.68 kg/m   Physical Exam Constitutional:      General: He is not in acute distress.    Appearance: Normal appearance. He is obese. He is not ill-appearing or diaphoretic.  HENT:     Head: Normocephalic and atraumatic. No raccoon eyes or Battle's sign.     Jaw: There is normal jaw occlusion. No trismus.     Right Ear: Tympanic membrane, ear canal and external ear normal. No hemotympanum.     Left Ear: Tympanic membrane, ear canal and external ear normal. No hemotympanum.     Nose: Nose normal. No rhinorrhea (No epistaxis).     Right Nostril: No epistaxis.     Left Nostril: No epistaxis.     Mouth/Throat:     Mouth: Mucous membranes are moist.     Pharynx: Oropharynx is clear. Uvula midline.  Eyes:     General: Vision grossly intact. Gaze aligned appropriately.     Extraocular Movements: Extraocular movements intact.     Conjunctiva/sclera: Conjunctivae normal.     Pupils: Pupils are equal, round, and reactive to light.  Cardiovascular:     Rate and Rhythm: Normal rate and regular rhythm.     Pulses: Normal pulses.          Dorsalis pedis pulses are 2+ on the right side and 2+ on the left side.       Posterior tibial pulses are 2+ on the right side and 2+ on the left side.     Heart sounds: Normal heart sounds.  Pulmonary:     Effort: Pulmonary effort is normal.     Breath sounds: Normal breath sounds. No rhonchi.  Chest:     Chest wall:  No tenderness.     Comments: No sign of injury to the chest Abdominal:     Tenderness: There is no abdominal tenderness. There is no guarding or rebound.     Comments: No sign of injury to the abdomen  Musculoskeletal: Normal range of motion.     Right hip: Normal. He exhibits normal range of motion and no tenderness.     Left hip: Normal. He exhibits normal range of motion and no tenderness.      Comments: Patient able to bring knees to chest bilaterally without pain.  Hips stable to compression bilaterally without pain or crepitus.  All major joints brought through full range of motion without pain or crepitus.  No midline thoracic or lumbar spinal tenderness to palpation, no paraspinal muscle tenderness, no deformity, crepitus, or step-off noted. No sign of injury to the back.   Feet:     Right foot:     Protective Sensation: 5 sites tested. 5 sites sensed.     Left foot:     Protective Sensation: 5 sites tested. 5 sites sensed.  Skin:    General: Skin is warm and dry.     Capillary Refill: Capillary refill takes less than 2 seconds.          Comments: Patient with abrasion overlying the left anterior tibia.  No gross deformity.  Minimal tenderness to palpation of this area.  Neurological:     General: No focal deficit present.     Mental Status: He is alert and oriented to person, place, and time.     GCS: GCS eye subscore is 4. GCS verbal subscore is 5. GCS motor subscore is 6.     Comments: Mental Status: Alert, oriented, thought content appropriate, able to give a coherent history. Speech fluent without evidence of aphasia. Able to follow 2 step commands without difficulty. Cranial Nerves: II: Peripheral visual fields grossly normal, pupils equal, round, reactive to light III,IV, VI: ptosis not present, extra-ocular motions intact bilaterally V,VII: smile symmetric, eyebrows raise symmetric, facial light touch sensation equal VIII: hearing grossly normal to voice X: uvula elevates symmetrically XI: bilateral shoulder shrug symmetric and strong XII: midline tongue extension without fassiculations Motor: Normal tone. 5/5 strength in upper and lower extremities bilaterally including strong and equal grip strength and dorsiflexion/plantar flexion Sensory: Sensation intact to light touch in all extremities. Cerebellar: normal finger-to-nose with bilateral upper  extremities. No pronator drift.  CV: distal pulses palpable throughout  Psychiatric:        Mood and Affect: Mood normal.        Behavior: Behavior normal.    ED Treatments / Results  Labs (all labs ordered are listed, but only abnormal results are displayed) Labs Reviewed  CBC WITH DIFFERENTIAL/PLATELET - Abnormal; Notable for the following components:      Result Value   WBC 13.6 (*)    Lymphs Abs 5.4 (*)    All other components within normal limits  BASIC METABOLIC PANEL - Abnormal; Notable for the following components:   Potassium 3.3 (*)    Glucose, Bld 181 (*)    All other components within normal limits  CBG MONITORING, ED - Abnormal; Notable for the following components:   Glucose-Capillary 169 (*)    All other components within normal limits    EKG None  Radiology Ct Head Wo Contrast  Result Date: 09/08/2018 CLINICAL DATA:  Recent fall from ladder with pain, initial encounter EXAM: CT HEAD WITHOUT CONTRAST CT CERVICAL SPINE WITHOUT  CONTRAST TECHNIQUE: Multidetector CT imaging of the head and cervical spine was performed following the standard protocol without intravenous contrast. Multiplanar CT image reconstructions of the cervical spine were also generated. COMPARISON:  06/30/2009 FINDINGS: CT HEAD FINDINGS Brain: Mild atrophic changes are noted. There is mild subdural hematoma noted on the right along the frontoparietal region with approximately 4 mm thickness identified. Additionally there is hematoma along the falx on the right near the vertex posteriorly which measures approximately 1.6 cm in thickness and approximately 4.4 cm in AP diameter. It causes some localized mass effect. No definitive midline shift is seen. No subarachnoid component is noted. No parenchymal hemorrhage is noted. No acute infarct or focal mass is seen. Vascular: No hyperdense vessel or unexpected calcification. Skull: Normal. Negative for fracture or focal lesion. Sinuses/Orbits: No acute finding.  Other: None. CT CERVICAL SPINE FINDINGS Alignment: Within normal limits. Skull base and vertebrae: Vertebral body height is well maintained. Seven cervical vertebra are well visualized. No anterolisthesis is seen. Mild facet hypertrophic changes are noted. No acute fracture or acute facet abnormality is seen. Mild osteophytic changes are noted as well. The odontoid is unremarkable. Soft tissues and spinal canal: Within normal limits. Upper chest: Within normal limits. Other: None IMPRESSION: CT of the head: Right-sided subdural hematoma as described with evidence of hematoma along the falx eccentric to the right. No significant midline shift is noted. No subarachnoid component is seen. Follow-up is recommended as clinically indicated. CT of the cervical spine: Multilevel degenerative change without acute abnormality. Critical Value/emergent results were called by telephone at the time of interpretation on 09/08/2018 at 1:45 pm to [facility] [place], who verbally acknowledged these results. Electronically Signed   By: Inez Catalina M.D.   On: 09/08/2018 13:45   Ct Cervical Spine Wo Contrast  Result Date: 09/08/2018 CLINICAL DATA:  Recent fall from ladder with pain, initial encounter EXAM: CT HEAD WITHOUT CONTRAST CT CERVICAL SPINE WITHOUT CONTRAST TECHNIQUE: Multidetector CT imaging of the head and cervical spine was performed following the standard protocol without intravenous contrast. Multiplanar CT image reconstructions of the cervical spine were also generated. COMPARISON:  06/30/2009 FINDINGS: CT HEAD FINDINGS Brain: Mild atrophic changes are noted. There is mild subdural hematoma noted on the right along the frontoparietal region with approximately 4 mm thickness identified. Additionally there is hematoma along the falx on the right near the vertex posteriorly which measures approximately 1.6 cm in thickness and approximately 4.4 cm in AP diameter. It causes some localized mass effect. No definitive midline  shift is seen. No subarachnoid component is noted. No parenchymal hemorrhage is noted. No acute infarct or focal mass is seen. Vascular: No hyperdense vessel or unexpected calcification. Skull: Normal. Negative for fracture or focal lesion. Sinuses/Orbits: No acute finding. Other: None. CT CERVICAL SPINE FINDINGS Alignment: Within normal limits. Skull base and vertebrae: Vertebral body height is well maintained. Seven cervical vertebra are well visualized. No anterolisthesis is seen. Mild facet hypertrophic changes are noted. No acute fracture or acute facet abnormality is seen. Mild osteophytic changes are noted as well. The odontoid is unremarkable. Soft tissues and spinal canal: Within normal limits. Upper chest: Within normal limits. Other: None IMPRESSION: CT of the head: Right-sided subdural hematoma as described with evidence of hematoma along the falx eccentric to the right. No significant midline shift is noted. No subarachnoid component is seen. Follow-up is recommended as clinically indicated. CT of the cervical spine: Multilevel degenerative change without acute abnormality. Critical Value/emergent results were called by telephone  at the time of interpretation on 09/08/2018 at 1:45 pm to [facility] [place], who verbally acknowledged these results. Electronically Signed   By: Inez Catalina M.D.   On: 09/08/2018 13:45   Dg Chest Portable 1 View  Result Date: 09/08/2018 CLINICAL DATA:  Trauma secondary to a fall today. EXAM: PORTABLE CHEST 1 VIEW COMPARISON:  01/14/2018 FINDINGS: The heart size and mediastinal contours are within normal limits. Both lungs are clear. The visualized skeletal structures are unremarkable. CABG.  Aortic atherosclerosis. IMPRESSION: No acute abnormalities. Aortic Atherosclerosis (ICD10-I70.0). Electronically Signed   By: Lorriane Shire M.D.   On: 09/08/2018 14:12    Procedures .Critical Care Performed by: Deliah Boston, PA-C Authorized by: Deliah Boston, PA-C     Critical care provider statement:    Critical care time (minutes):  31   Critical care was necessary to treat or prevent imminent or life-threatening deterioration of the following conditions:  CNS failure or compromise (Subdural Hematoma)   Critical care was time spent personally by me on the following activities:  Ordering and performing treatments and interventions, ordering and review of laboratory studies, ordering and review of radiographic studies, pulse oximetry, re-evaluation of patient's condition, review of old charts, obtaining history from patient or surrogate, examination of patient, evaluation of patient's response to treatment, discussions with consultants and development of treatment plan with patient or surrogate   (including critical care time)  Medications Ordered in ED Medications  acetaminophen (TYLENOL) tablet 650 mg (has no administration in time range)     Initial Impression / Assessment and Plan / ED Course  I have reviewed the triage vital signs and the nursing notes.  Pertinent labs & imaging results that were available during my care of the patient were reviewed by me and considered in my medical decision making (see chart for details).    73 year old male on Plavix and aspirin present day following fall.  Concern for head injury and neck injury on arrival.  Neuro exam without deficit.  Patient fully alert and oriented.  Patient with abrasion to the left lower leg, no gross deformity.  Full range of motion 5/5 strength to all major joints including hips.  No tenderness of the chest or abdomen.  No sign of injury to the back or hips.  CBG 169 CBC with mild leukocytosis BMP unremarkable  DG chest:    IMPRESSION:  No acute abnormalities.    Aortic Atherosclerosis (ICD10-I70.0).  ---------------- CT head/cervical spine:  IMPRESSION:  CT of the head: Right-sided subdural hematoma as described with  evidence of hematoma along the falx eccentric to the  right. No  significant midline shift is noted. No subarachnoid component is  seen. Follow-up is recommended as clinically indicated.    CT of the cervical spine: Multilevel degenerative change without  acute abnormality.  -------------------- Case discussed with Dr. Eulis Foster.  Patient seen and evaluated by Dr. Eulis Foster, patient and wife updated on care plan and imaging findings. - Patient reassessed resting comfortably no acute distress.,  Fully alert and oriented. - Patient seen by Dr. Shelba Flake, neurosurgery who has admitted patient.    Note: Portions of this report may have been transcribed using voice recognition software. Every effort was made to ensure accuracy; however, inadvertent computerized transcription errors may still be present.  Final Clinical Impressions(s) / ED Diagnoses   Final diagnoses:  Subdural hematoma [facility])    ED Discharge Orders    None       Gari Crown 09/08/18  [place], MD 09/08/18 1725

## 2018-09-08 NOTE — Progress Notes (Signed)
1615 Patient arrived to unit with wife and belongings at bedside. Vitals stable, oriented to room/unit.   1720 pt states remembering accident; states that he "lost balance on ladder tried to grab hold of barn loft floor then fell. My wedding ring fell off in the process. I don't know how long I was out for but the pain of my leg woke me up, I just knew I broke it.  Out of instinct I put the ladder up and then got my wife to help me."

## 2018-09-08 NOTE — ED Notes (Signed)
ED TO INPATIENT HANDOFF REPORT  ED Nurse Name and Phone #: [phone]  S Name/Age/Gender Brandon Stone 73 y.o. male Room/Bed: 038C/038C  Code Status   Code Status: Not on file  Home/SNF/Other Home Patient oriented to: self, place, time and situation Is this baseline? Yes   Triage Complete: Triage complete  Chief Complaint fall  Triage Note To ED via [facility] from home, was on a 6 foot ladder, ladder slid, pt thinks his feet slid through rungs, unsure if loss of consciousness, says he "came to and was standing over ladder"  C-Collar on from EMS, denies any pain, has abrasion to left lower leg - moves all extremeties.    Allergies Allergies  Allergen Reactions  . Other Hives    Intolerance to strong pain medications  Rash to opsite and tegaderm tape.  . Ticlopidine Hcl Hives and Itching  . Black Pepper [[place]] Palpitations    HEART PALPITATIONS HEART PALPITATIONS  . Codeine Palpitations and Other (See Comments)    Wild dreams, palpitations "wild dreams"    Level of Care/Admitting Diagnosis ED Disposition    ED Disposition Condition [place]: [place] [100100]  Level of Care: Telemetry Medical [104]  Diagnosis: Subdural hematoma [facility]) [[id number]]  Admitting Physician: Hans Eden  Attending Physician: Eustace [place] [[id number]]  Estimated length of stay: 3 - 4 days  Certification:: I certify this patient will need inpatient services for at least 2 midnights  Bed request comments: 4np  PT Class (Do Not Modify): Inpatient [101]  PT Acc Code (Do Not Modify): Private [1]       B Medical/Surgery History Past Medical History:  Diagnosis Date  . Anginal pain ([place])   . Arthritis   . Barrett's esophagus   . BPH (benign prostatic hypertrophy)   . CAD (coronary artery disease)     a. CABG 1991;  b.  [place];  c.  S/P PTCA  stents to SVG ot OM last in 2003;  d.  Redo CABG x 2 in 2004 (VG->OM->LCX, LRA->PDA);   e. 12/2011 Cath: 3vd, sev LM into LCX dzs, VG->OM 100, LIMA->DIAG->LAD patent, Rad Art->PDA patent, EF 45%, Med Rx.  . Diverticulosis   . DM (diabetes mellitus) ([place])   . Dyslipidemia   . Dyspnea   . Gallstones   . GERD (gastroesophageal reflux disease)   . Gout   . History of hiatal hernia   . HTN (hypertension)   . Hyperlipidemia   . Leukemia in remission ([place])   . Psoriasis   . Rheumatic fever    as a child  . S/P coronary artery stent placement 1998  . TIA (transient ischemic attack)    FOUND ON MRI  PER PATIENT-     NO PROBLEM   Past Surgical History:  Procedure Laterality Date  . APPENDECTOMY    . CHOLECYSTECTOMY N/A 09/18/2016   Procedure: LAPAROSCOPIC CHOLECYSTECTOMY WITH INTRAOPERATIVE CHOLANGIOGRAM;  Surgeon: Jackolyn Confer, MD;  Location: [place];  Service: General;  Laterality: N/A;  . COLONOSCOPY  07/2013  . CORONARY ANGIOPLASTY     STENTS  . CORONARY ARTERY BYPASS GRAFT     1991 (LIMA to LAD/Diag;  SVG to RCA;  SVG to OM)  . CORONARY ARTERY BYPASS GRAFT     2004 (L radial to PDA; SVG to OM1/ distal LCx)  . TONSILLECTOMY AND ADENOIDECTOMY    . UMBILICAL HERNIA REPAIR       A IV  Location/Drains/Wounds Patient Lines/Drains/Airways Status   Active Line/Drains/Airways    Name:   Placement date:   Placement time:   Site:   Days:   Peripheral IV 09/08/18 Right Antecubital   09/08/18    1349    Antecubital   less than 1   Incision - 4 Ports Abdomen 1: Umbilicus 2: Mid;Upper 3: Right;Medial 4: Right;Lateral   09/18/16    -     720          Intake/Output Last 24 hours No intake or output data in the 24 hours ending 09/08/18 1524  Labs/Imaging Results for orders placed or performed during the hospital encounter of 09/08/18 (from the past 48 hour(s))  CBC with Differential     Status: Abnormal   Collection Time: 09/08/18  1:17 PM  Result Value Ref Range   WBC 13.6 (H) 4.0 - 10.5 K/uL   RBC 4.76 4.22 - 5.81 MIL/uL   Hemoglobin 14.4 13.0 - 17.0 g/dL   HCT 42.6  39.0 - 52.0 %   MCV 89.5 80.0 - 100.0 fL   MCH 30.3 26.0 - 34.0 pg   MCHC 33.8 30.0 - 36.0 g/dL   RDW 12.4 11.5 - 15.5 %   Platelets 211 150 - 400 K/uL   nRBC 0.0 0.0 - 0.2 %   Neutrophils Relative % 53 %   Neutro Abs 7.2 1.7 - 7.7 K/uL   Lymphocytes Relative 40 %   Lymphs Abs 5.4 (H) 0.7 - 4.0 K/uL   Monocytes Relative 7 %   Monocytes Absolute 0.9 0.1 - 1.0 K/uL   Eosinophils Relative 0 %   Eosinophils Absolute 0.1 0.0 - 0.5 K/uL   Basophils Relative 0 %   Basophils Absolute 0.0 0.0 - 0.1 K/uL   Immature Granulocytes 0 %   Abs Immature Granulocytes 0.06 0.00 - 0.07 K/uL    Comment: Performed at [facility], [address] [place], [place] [address]  Basic metabolic panel     Status: Abnormal   Collection Time: 09/08/18  1:17 PM  Result Value Ref Range   Sodium 137 135 - 145 mmol/L   Potassium 3.3 (L) 3.5 - 5.1 mmol/L   Chloride 104 98 - 111 mmol/L   CO2 25 22 - 32 mmol/L   Glucose, Bld 181 (H) 70 - 99 mg/dL   BUN 14 8 - 23 mg/dL   Creatinine, Ser 1.05 0.61 - 1.24 mg/dL   Calcium 9.6 8.9 - 10.3 mg/dL   GFR calc non Af Amer >60 >60 mL/min   GFR calc Af Amer >60 >60 mL/min   Anion gap 8 5 - 15    Comment: Performed at [facility] [address] [place], [place] [address]  CBG monitoring, ED     Status: Abnormal   Collection Time: 09/08/18  1:51 PM  Result Value Ref Range   Glucose-Capillary 169 (H) 70 - 99 mg/dL   Ct Head Wo Contrast  Result Date: 09/08/2018 CLINICAL DATA:  Recent fall from ladder with pain, initial encounter EXAM: CT HEAD WITHOUT CONTRAST CT CERVICAL SPINE WITHOUT CONTRAST TECHNIQUE: Multidetector CT imaging of the head and cervical spine was performed following the standard protocol without intravenous contrast. Multiplanar CT image reconstructions of the cervical spine were also generated. COMPARISON:  06/30/2009 FINDINGS: CT HEAD FINDINGS Brain: Mild atrophic changes are noted. There is mild subdural hematoma noted on the right along the  frontoparietal region with approximately 4 mm thickness identified. Additionally there is hematoma  along the falx on the right near the vertex posteriorly which measures approximately 1.6 cm in thickness and approximately 4.4 cm in AP diameter. It causes some localized mass effect. No definitive midline shift is seen. No subarachnoid component is noted. No parenchymal hemorrhage is noted. No acute infarct or focal mass is seen. Vascular: No hyperdense vessel or unexpected calcification. Skull: Normal. Negative for fracture or focal lesion. Sinuses/Orbits: No acute finding. Other: None. CT CERVICAL SPINE FINDINGS Alignment: Within normal limits. Skull base and vertebrae: Vertebral body height is well maintained. Seven cervical vertebra are well visualized. No anterolisthesis is seen. Mild facet hypertrophic changes are noted. No acute fracture or acute facet abnormality is seen. Mild osteophytic changes are noted as well. The odontoid is unremarkable. Soft tissues and spinal canal: Within normal limits. Upper chest: Within normal limits. Other: None IMPRESSION: CT of the head: Right-sided subdural hematoma as described with evidence of hematoma along the falx eccentric to the right. No significant midline shift is noted. No subarachnoid component is seen. Follow-up is recommended as clinically indicated. CT of the cervical spine: Multilevel degenerative change without acute abnormality. Critical Value/emergent results were called by telephone at the time of interpretation on 09/08/2018 at 1:45 pm to [facility], [place], who verbally acknowledged these results. Electronically Signed   By: Inez Catalina M.D.   On: 09/08/2018 13:45   Ct Cervical Spine Wo Contrast  Result Date: 09/08/2018 CLINICAL DATA:  Recent fall from ladder with pain, initial encounter EXAM: CT HEAD WITHOUT CONTRAST CT CERVICAL SPINE WITHOUT CONTRAST TECHNIQUE: Multidetector CT imaging of the head and cervical spine was performed following the  standard protocol without intravenous contrast. Multiplanar CT image reconstructions of the cervical spine were also generated. COMPARISON:  06/30/2009 FINDINGS: CT HEAD FINDINGS Brain: Mild atrophic changes are noted. There is mild subdural hematoma noted on the right along the frontoparietal region with approximately 4 mm thickness identified. Additionally there is hematoma along the falx on the right near the vertex posteriorly which measures approximately 1.6 cm in thickness and approximately 4.4 cm in AP diameter. It causes some localized mass effect. No definitive midline shift is seen. No subarachnoid component is noted. No parenchymal hemorrhage is noted. No acute infarct or focal mass is seen. Vascular: No hyperdense vessel or unexpected calcification. Skull: Normal. Negative for fracture or focal lesion. Sinuses/Orbits: No acute finding. Other: None. CT CERVICAL SPINE FINDINGS Alignment: Within normal limits. Skull base and vertebrae: Vertebral body height is well maintained. Seven cervical vertebra are well visualized. No anterolisthesis is seen. Mild facet hypertrophic changes are noted. No acute fracture or acute facet abnormality is seen. Mild osteophytic changes are noted as well. The odontoid is unremarkable. Soft tissues and spinal canal: Within normal limits. Upper chest: Within normal limits. Other: None IMPRESSION: CT of the head: Right-sided subdural hematoma as described with evidence of hematoma along the falx eccentric to the right. No significant midline shift is noted. No subarachnoid component is seen. Follow-up is recommended as clinically indicated. CT of the cervical spine: Multilevel degenerative change without acute abnormality. Critical Value/emergent results were called by telephone at the time of interpretation on 09/08/2018 at 1:45 pm to [facility], [place], who verbally acknowledged these results. Electronically Signed   By: Inez Catalina M.D.   On: 09/08/2018 13:45   Dg Chest  Portable 1 View  Result Date: 09/08/2018 CLINICAL DATA:  Trauma secondary to a fall today. EXAM: PORTABLE CHEST 1 VIEW COMPARISON:  01/14/2018 FINDINGS: The heart size and mediastinal  contours are within normal limits. Both lungs are clear. The visualized skeletal structures are unremarkable. CABG.  Aortic atherosclerosis. IMPRESSION: No acute abnormalities. Aortic Atherosclerosis (ICD10-I70.0). Electronically Signed   By: Lorriane Shire M.D.   On: 09/08/2018 14:12    Pending Labs Unresulted Labs (From admission, onward)    Start     Ordered   Signed and Held  HIV antibody (Routine Testing)  Once,   R     Signed and Held          Vitals/Pain Today's Vitals   09/08/18 1430 09/08/18 1445 09/08/18 1500 09/08/18 1515  BP: 130/76 130/81 123/78 128/77  Pulse: 69 69 72 66  Resp: 15 13 16 13   Temp:      TempSrc:      SpO2: 96% 95% 95% 92%  Weight:      Height:      PainSc:        Isolation Precautions No active isolations  Medications Medications  acetaminophen (TYLENOL) tablet 650 mg (650 mg Oral Given 09/08/18 1523)    Mobility walks Low fall risk   Focused Assessments Neuro Assessment Handoff:  Swallow screen pass? No  Cardiac Rhythm: Normal sinus rhythm NIH Stroke Scale ( + Modified Stroke Scale Criteria)  Interval: Initial Level of Consciousness (1a.)   : Alert, keenly responsive LOC Questions (1b. )   +: Answers both questions correctly LOC Commands (1c. )   + : Performs both tasks correctly Best Gaze (2. )  +: Normal Visual (3. )  +: No visual loss Facial Palsy (4. )    : Normal symmetrical movements Motor Arm, Left (5a. )   +: No drift Motor Arm, Right (5b. )   +: No drift Motor Leg, Left (6a. )   +: No drift Motor Leg, Right (6b. )   +: No drift Limb Ataxia (7. ): Absent Sensory (8. )   +: Normal, no sensory loss Best Language (9. )   +: No aphasia Dysarthria (10. ): Normal Extinction/Inattention (11.)   +: No Abnormality Modified SS Total  +:  0 Complete NIHSS TOTAL: 0     Neuro Assessment: Within Defined Limits Neuro Checks:   Initial (09/08/18 1341)  Last Documented NIHSS Modified Score: 0 (09/08/18 1341) Has TPA been given? No If patient is a Neuro Trauma and patient is going to OR before floor call report to [place] nurse: [phone] or [phone]     R Recommendations: See Admitting Provider Note  Report given to:   Additional Notes:

## 2018-09-08 NOTE — ED Provider Notes (Signed)
  Face-to-face evaluation   History: Fall from height, on 8 foot ladder, possibly feet were 5 feet off the ground. Brief LOC, and disorientation. He c/o neck and head pain.  Physical exam:, Lucid, cooperative.  Head without visible injury.  Extremities normal range of motion.  Contusion and abrasion mid anterior left shin.  No dysarthria or aphasia.  Medical screening examination/treatment/procedure(s) were conducted as a shared visit with non-physician practitioner(s) and myself.  I personally evaluated the patient during the encounter    Daleen Bo, MD 09/08/18 1725

## 2018-09-08 NOTE — ED Notes (Signed)
Neurosurgeon at bedside °

## 2018-09-08 NOTE — ED Triage Notes (Signed)
To ED via [facility] from home, was on a 6 foot ladder, ladder slid, pt thinks his feet slid through rungs, unsure if loss of consciousness, says he "came to and was standing over ladder"  C-Collar on from EMS, denies any pain, has abrasion to left lower leg - moves all extremeties.

## 2018-09-08 NOTE — H&P (Signed)
Brandon Stone is an 73 y.o. male.   HPI:  73 year old male presented to the ED today after falling about 6 feet off of a ladder. The ladder slipped out from underneath him when he was working in his barn. Does not remember losing consciousness. He does report some headache and neck pain. Denies any NV or vision changes. States that he feels a little dizzy when he lays down. He takes aspirin and plavix which he has been on for several years now.   Past Medical History:  Diagnosis Date  . Anginal pain ([place])   . Arthritis   . Barrett's esophagus   . BPH (benign prostatic hypertrophy)   . CAD (coronary artery disease)     a. CABG 1991;  b.  [place];  c.  S/P PTCA  stents to SVG ot OM last in 2003;  d.  Redo CABG x 2 in 2004 (VG->OM->LCX, LRA->PDA);  e. 12/2011 Cath: 3vd, sev LM into LCX dzs, VG->OM 100, LIMA->DIAG->LAD patent, Rad Art->PDA patent, EF 45%, Med Rx.  . Diverticulosis   . DM (diabetes mellitus) ([place])   . Dyslipidemia   . Dyspnea   . Gallstones   . GERD (gastroesophageal reflux disease)   . Gout   . History of hiatal hernia   . HTN (hypertension)   . Hyperlipidemia   . Leukemia in remission ([place])   . Psoriasis   . Rheumatic fever    as a child  . S/P coronary artery stent placement 1998  . TIA (transient ischemic attack)    FOUND ON MRI  PER PATIENT-     NO PROBLEM    Past Surgical History:  Procedure Laterality Date  . APPENDECTOMY    . CHOLECYSTECTOMY N/A 09/18/2016   Procedure: LAPAROSCOPIC CHOLECYSTECTOMY WITH INTRAOPERATIVE CHOLANGIOGRAM;  Surgeon: Jackolyn Confer, MD;  Location: [place];  Service: General;  Laterality: N/A;  . COLONOSCOPY  07/2013  . CORONARY ANGIOPLASTY     STENTS  . CORONARY ARTERY BYPASS GRAFT     1991 (LIMA to LAD/Diag;  SVG to RCA;  SVG to OM)  . CORONARY ARTERY BYPASS GRAFT     2004 (L radial to PDA; SVG to OM1/ distal LCx)  . TONSILLECTOMY AND ADENOIDECTOMY    . UMBILICAL HERNIA REPAIR      Allergies  Allergen Reactions  . Other  Hives    Intolerance to strong pain medications  Rash to opsite and tegaderm tape.  . Ticlopidine Hcl Hives and Itching  . Black Pepper [[place]] Palpitations    HEART PALPITATIONS HEART PALPITATIONS  . Codeine Palpitations and Other (See Comments)    Wild dreams, palpitations "wild dreams"    Social History   Tobacco Use  . Smoking status: Former Smoker    Years: 8.00    Last attempt to quit: 06/24/1971    Years since quitting: 47.2  . Smokeless tobacco: Never Used  Substance Use Topics  . Alcohol use: No    Family History  Problem Relation Age of Onset  . Heart disease Mother   . Hypertension Mother   . Hyperlipidemia Mother   . Diabetes Mother   . Skin cancer Mother        skin  . Heart disease Father   . Hypertension Father   . Hyperlipidemia Father   . Diabetes Father   . Stroke Sister   . Stroke Brother   . Colon cancer Neg Hx      Review of Systems  Positive  ROS: as above  All other systems have been reviewed and were otherwise negative with the exception of those mentioned in the HPI and as above.  Objective: Vital signs in last 24 hours: Temp:  [97.7 F (36.5 C)] 97.7 F (36.5 C) (03/18 1305) Pulse Rate:  [62-70] 70 (03/18 1415) Resp:  [14-17] 17 (03/18 1415) BP: (134-160)/(84-93) 138/87 (03/18 1415) SpO2:  [92 %-100 %] 96 % (03/18 1415) Weight:  [99.8 kg] 99.8 kg (03/18 1309)  General Appearance: Alert, cooperative, no distress, appears stated age Head: Normocephalic, without obvious abnormality, atraumatic Eyes: PERRL, conjunctiva/corneas clear, EOM's intact, fundi benign, both eyes      Lungs:respirations unlabored Heart: Regular rate and rhythm Extremities: Extremities normal, atraumatic, no cyanosis or edema Skin: Skin color, texture, turgor normal, no rashes or lesions  NEUROLOGIC:   Mental status: A&O x4, no aphasia, good attention span, Memory and fund of knowledge Motor Exam - grossly normal, normal tone and bulk Sensory Exam -  grossly normal Reflexes: symmetric, no pathologic reflexes, No Hoffman's, No clonus Coordination - grossly normal Gait - not tested Balance - not tested Cranial Nerves: I: smell Not tested  II: visual acuity  OS: na OD: na  II: visual fields Full to confrontation  II: pupils Equal, round, reactive to light  III,VII: ptosis None  III,IV,VI: extraocular muscles  Full ROM  V: mastication Normal  V: facial light touch sensation    V,VII: corneal reflex    VII: facial muscle function - upper  Normal  VII: facial muscle function - lower Normal  VIII: hearing Not tested  IX: soft palate elevation    IX,X: gag reflex   XI: trapezius strength  5/5  XI: sternocleidomastoid strength 5/5  XI: neck flexion strength  5/5  XII: tongue strength      Data Review Lab Results  Component Value Date   WBC 13.6 (H) 09/08/2018   HGB 14.4 09/08/2018   HCT 42.6 09/08/2018   MCV 89.5 09/08/2018   PLT 211 09/08/2018   Lab Results  Component Value Date   NA 137 09/08/2018   K 3.3 (L) 09/08/2018   CL 104 09/08/2018   CO2 25 09/08/2018   BUN 14 09/08/2018   CREATININE 1.05 09/08/2018   GLUCOSE 181 (H) 09/08/2018   Lab Results  Component Value Date   INR 0.97 12/10/2017   PROTIME 12.0 06/11/2017    Radiology: Ct Head Wo Contrast  Result Date: 09/08/2018 CLINICAL DATA:  Recent fall from ladder with pain, initial encounter EXAM: CT HEAD WITHOUT CONTRAST CT CERVICAL SPINE WITHOUT CONTRAST TECHNIQUE: Multidetector CT imaging of the head and cervical spine was performed following the standard protocol without intravenous contrast. Multiplanar CT image reconstructions of the cervical spine were also generated. COMPARISON:  06/30/2009 FINDINGS: CT HEAD FINDINGS Brain: Mild atrophic changes are noted. There is mild subdural hematoma noted on the right along the frontoparietal region with approximately 4 mm thickness identified. Additionally there is hematoma along the falx on the right near the vertex  posteriorly which measures approximately 1.6 cm in thickness and approximately 4.4 cm in AP diameter. It causes some localized mass effect. No definitive midline shift is seen. No subarachnoid component is noted. No parenchymal hemorrhage is noted. No acute infarct or focal mass is seen. Vascular: No hyperdense vessel or unexpected calcification. Skull: Normal. Negative for fracture or focal lesion. Sinuses/Orbits: No acute finding. Other: None. CT CERVICAL SPINE FINDINGS Alignment: Within normal limits. Skull base and vertebrae: Vertebral body height is well  maintained. Seven cervical vertebra are well visualized. No anterolisthesis is seen. Mild facet hypertrophic changes are noted. No acute fracture or acute facet abnormality is seen. Mild osteophytic changes are noted as well. The odontoid is unremarkable. Soft tissues and spinal canal: Within normal limits. Upper chest: Within normal limits. Other: None IMPRESSION: CT of the head: Right-sided subdural hematoma as described with evidence of hematoma along the falx eccentric to the right. No significant midline shift is noted. No subarachnoid component is seen. Follow-up is recommended as clinically indicated. CT of the cervical spine: Multilevel degenerative change without acute abnormality. Critical Value/emergent results were called by telephone at the time of interpretation on 09/08/2018 at 1:45 pm to [facility], [place], who verbally acknowledged these results. Electronically Signed   By: Inez Catalina M.D.   On: 09/08/2018 13:45   Ct Cervical Spine Wo Contrast  Result Date: 09/08/2018 CLINICAL DATA:  Recent fall from ladder with pain, initial encounter EXAM: CT HEAD WITHOUT CONTRAST CT CERVICAL SPINE WITHOUT CONTRAST TECHNIQUE: Multidetector CT imaging of the head and cervical spine was performed following the standard protocol without intravenous contrast. Multiplanar CT image reconstructions of the cervical spine were also generated. COMPARISON:   06/30/2009 FINDINGS: CT HEAD FINDINGS Brain: Mild atrophic changes are noted. There is mild subdural hematoma noted on the right along the frontoparietal region with approximately 4 mm thickness identified. Additionally there is hematoma along the falx on the right near the vertex posteriorly which measures approximately 1.6 cm in thickness and approximately 4.4 cm in AP diameter. It causes some localized mass effect. No definitive midline shift is seen. No subarachnoid component is noted. No parenchymal hemorrhage is noted. No acute infarct or focal mass is seen. Vascular: No hyperdense vessel or unexpected calcification. Skull: Normal. Negative for fracture or focal lesion. Sinuses/Orbits: No acute finding. Other: None. CT CERVICAL SPINE FINDINGS Alignment: Within normal limits. Skull base and vertebrae: Vertebral body height is well maintained. Seven cervical vertebra are well visualized. No anterolisthesis is seen. Mild facet hypertrophic changes are noted. No acute fracture or acute facet abnormality is seen. Mild osteophytic changes are noted as well. The odontoid is unremarkable. Soft tissues and spinal canal: Within normal limits. Upper chest: Within normal limits. Other: None IMPRESSION: CT of the head: Right-sided subdural hematoma as described with evidence of hematoma along the falx eccentric to the right. No significant midline shift is noted. No subarachnoid component is seen. Follow-up is recommended as clinically indicated. CT of the cervical spine: Multilevel degenerative change without acute abnormality. Critical Value/emergent results were called by telephone at the time of interpretation on 09/08/2018 at 1:45 pm to [facility], [place], who verbally acknowledged these results. Electronically Signed   By: Inez Catalina M.D.   On: 09/08/2018 13:45   Dg Chest Portable 1 View  Result Date: 09/08/2018 CLINICAL DATA:  Trauma secondary to a fall today. EXAM: PORTABLE CHEST 1 VIEW COMPARISON:   01/14/2018 FINDINGS: The heart size and mediastinal contours are within normal limits. Both lungs are clear. The visualized skeletal structures are unremarkable. CABG.  Aortic atherosclerosis. IMPRESSION: No acute abnormalities. Aortic Atherosclerosis (ICD10-I70.0). Electronically Signed   By: Lorriane Shire M.D.   On: 09/08/2018 14:12    Assessment/Plan: 73 year old presented to the ED after falling off of a ladder and striking his head. CT shows a small right sided frontoparietal subdural hematoma measuring 38mm in thickness as well as a 4cm subdural hematoma along the falx on the right side, no midline shift. Stop  all blood thinners. I do not think he needs any acute surgical intervention at this time. This will likely resolve with time but I would like to observe him for a couple nights since he does take aspirin and plavix. We will admit to stepdown and repeat head CT in the morning.    Ocie Cornfield [facility] 09/08/2018 2:34 PM

## 2018-09-08 NOTE — Progress Notes (Signed)
PT Cancellation Note  Patient Details Name: Brandon Stone MRN: [id number] DOB: June 11, 1946   Cancelled Treatment:    Reason Eval/Treat Not Completed: Patient at procedure or test/unavailable (radiology).  Ellamae Sia, PT, DPT Acute Rehabilitation Services Pager [phone] Office [phone]    Willy Eddy 09/08/2018, 4:39 PM

## 2018-09-09 ENCOUNTER — Inpatient Hospital Stay (HOSPITAL_COMMUNITY): Payer: Medicare HMO

## 2018-09-09 LAB — HIV ANTIBODY (ROUTINE TESTING W REFLEX): HIV Screen 4th Generation wRfx: NONREACTIVE

## 2018-09-09 NOTE — Progress Notes (Signed)
Patient ID: Brandon Stone, male   DOB: 02-28-1946, 73 y.o.   MRN: [id number] Subjective: Patient reports mild R frontal H/A, no visual changes, no NTW  Objective: Vital signs in last 24 hours: Temp:  [97.6 F (36.4 C)-99 F (37.2 C)] 99 F (37.2 C) (03/19 0431) Pulse Rate:  [62-72] 70 (03/19 0431) Resp:  [13-18] 18 (03/18 2000) BP: (110-160)/(69-93) 139/78 (03/19 0431) SpO2:  [92 %-100 %] 97 % (03/19 0431) Weight:  [99.8 kg] 99.8 kg (03/18 1309)  Intake/Output from previous day: 03/18 0701 - 03/19 0700 In: 631 [I.V.:631] Out: -  Intake/Output this shift: Total I/O In: 631 [I.V.:631] Out: -   Neurologic: Grossly normal, awake alert non focal  Lab Results: Lab Results  Component Value Date   WBC 13.6 (H) 09/08/2018   HGB 14.4 09/08/2018   HCT 42.6 09/08/2018   MCV 89.5 09/08/2018   PLT 211 09/08/2018   Lab Results  Component Value Date   INR 0.97 12/10/2017   PROTIME 12.0 06/11/2017   BMET Lab Results  Component Value Date   NA 137 09/08/2018   K 3.3 (L) 09/08/2018   CL 104 09/08/2018   CO2 25 09/08/2018   GLUCOSE 181 (H) 09/08/2018   BUN 14 09/08/2018   CREATININE 1.05 09/08/2018   CALCIUM 9.6 09/08/2018    Studies/Results: Ct Head Wo Contrast  Result Date: 09/08/2018 CLINICAL DATA:  Recent fall from ladder with pain, initial encounter EXAM: CT HEAD WITHOUT CONTRAST CT CERVICAL SPINE WITHOUT CONTRAST TECHNIQUE: Multidetector CT imaging of the head and cervical spine was performed following the standard protocol without intravenous contrast. Multiplanar CT image reconstructions of the cervical spine were also generated. COMPARISON:  06/30/2009 FINDINGS: CT HEAD FINDINGS Brain: Mild atrophic changes are noted. There is mild subdural hematoma noted on the right along the frontoparietal region with approximately 4 mm thickness identified. Additionally there is hematoma along the falx on the right near the vertex posteriorly which measures approximately 1.6 cm in  thickness and approximately 4.4 cm in AP diameter. It causes some localized mass effect. No definitive midline shift is seen. No subarachnoid component is noted. No parenchymal hemorrhage is noted. No acute infarct or focal mass is seen. Vascular: No hyperdense vessel or unexpected calcification. Skull: Normal. Negative for fracture or focal lesion. Sinuses/Orbits: No acute finding. Other: None. CT CERVICAL SPINE FINDINGS Alignment: Within normal limits. Skull base and vertebrae: Vertebral body height is well maintained. Seven cervical vertebra are well visualized. No anterolisthesis is seen. Mild facet hypertrophic changes are noted. No acute fracture or acute facet abnormality is seen. Mild osteophytic changes are noted as well. The odontoid is unremarkable. Soft tissues and spinal canal: Within normal limits. Upper chest: Within normal limits. Other: None IMPRESSION: CT of the head: Right-sided subdural hematoma as described with evidence of hematoma along the falx eccentric to the right. No significant midline shift is noted. No subarachnoid component is seen. Follow-up is recommended as clinically indicated. CT of the cervical spine: Multilevel degenerative change without acute abnormality. Critical Value/emergent results were called by telephone at the time of interpretation on 09/08/2018 at 1:45 pm to [facility], [place], who verbally acknowledged these results. Electronically Signed   By: Inez Catalina M.D.   On: 09/08/2018 13:45   Ct Cervical Spine Wo Contrast  Result Date: 09/08/2018 CLINICAL DATA:  Recent fall from ladder with pain, initial encounter EXAM: CT HEAD WITHOUT CONTRAST CT CERVICAL SPINE WITHOUT CONTRAST TECHNIQUE: Multidetector CT imaging of the head and cervical spine was  performed following the standard protocol without intravenous contrast. Multiplanar CT image reconstructions of the cervical spine were also generated. COMPARISON:  06/30/2009 FINDINGS: CT HEAD FINDINGS Brain: Mild  atrophic changes are noted. There is mild subdural hematoma noted on the right along the frontoparietal region with approximately 4 mm thickness identified. Additionally there is hematoma along the falx on the right near the vertex posteriorly which measures approximately 1.6 cm in thickness and approximately 4.4 cm in AP diameter. It causes some localized mass effect. No definitive midline shift is seen. No subarachnoid component is noted. No parenchymal hemorrhage is noted. No acute infarct or focal mass is seen. Vascular: No hyperdense vessel or unexpected calcification. Skull: Normal. Negative for fracture or focal lesion. Sinuses/Orbits: No acute finding. Other: None. CT CERVICAL SPINE FINDINGS Alignment: Within normal limits. Skull base and vertebrae: Vertebral body height is well maintained. Seven cervical vertebra are well visualized. No anterolisthesis is seen. Mild facet hypertrophic changes are noted. No acute fracture or acute facet abnormality is seen. Mild osteophytic changes are noted as well. The odontoid is unremarkable. Soft tissues and spinal canal: Within normal limits. Upper chest: Within normal limits. Other: None IMPRESSION: CT of the head: Right-sided subdural hematoma as described with evidence of hematoma along the falx eccentric to the right. No significant midline shift is noted. No subarachnoid component is seen. Follow-up is recommended as clinically indicated. CT of the cervical spine: Multilevel degenerative change without acute abnormality. Critical Value/emergent results were called by telephone at the time of interpretation on 09/08/2018 at 1:45 pm to [facility], [place], who verbally acknowledged these results. Electronically Signed   By: Inez Catalina M.D.   On: 09/08/2018 13:45   Dg Chest Portable 1 View  Result Date: 09/08/2018 CLINICAL DATA:  Trauma secondary to a fall today. EXAM: PORTABLE CHEST 1 VIEW COMPARISON:  01/14/2018 FINDINGS: The heart size and mediastinal  contours are within normal limits. Both lungs are clear. The visualized skeletal structures are unremarkable. CABG.  Aortic atherosclerosis. IMPRESSION: No acute abnormalities. Aortic Atherosclerosis (ICD10-I70.0). Electronically Signed   By: Lorriane Shire M.D.   On: 09/08/2018 14:12   Dg Cerv Spine Flex&ext Only  Result Date: 09/08/2018 CLINICAL DATA:  Posterior neck pain and head trauma after falling from a ladder today. EXAM: CERVICAL SPINE - FLEXION AND EXTENSION VIEWS ONLY COMPARISON:  Cervical spine CT obtained earlier today. FINDINGS: Previously described multilevel degenerative changes. 1 mm of retrolisthesis at the C5-6 level in the neutral position, unchanged with flexion and extension. 1 mm retrolisthesis at the C4-5 level in the neutral position, increased to 2 mm with extension and coming 2 mm of anterolisthesis with flexion. Facet degenerative changes throughout the cervical spine. No fractures or prevertebral soft tissue swelling seen. IMPRESSION: 1. No fracture or traumatic subluxation. 2. Multilevel degenerative changes with mild motion at the C4-5 level with flexion and extension, as described above. Electronically Signed   By: Claudie Revering M.D.   On: 09/08/2018 16:49    Assessment/Plan: Doing great, awaiting CT head  Estimated body mass index is 30.68 kg/m as calculated from the following:   Height as of this encounter: 5\' 11"  (1.803 m).   Weight as of this encounter: 99.8 kg.    LOS: 1 day    Eustace Moore 09/09/2018, 6:26 AM

## 2018-09-09 NOTE — Evaluation (Signed)
Physical Therapy Evaluation/Discharge Patient Details Name: Brandon Stone MRN: [id number] DOB: [birth year] Today's Date: 09/09/2018   History of Present Illness  pt 73yo male admitted after falling from 17ft ladder at home, CT found small right sided frontoparietal subdural hematoma 4cm in thickness and 4cm subdural hematoma along falx on R side, no midline shift. PMH: TIA, coronary artery stent placement, leukemia in remission, HLD, HTN, DM, CAD, CABG, anginal pain  Clinical Impression  Pt in bed upon arrival and agreed to participate with therapy. Pt performed all functional mobility independently and safely. Pt reports feeling like his normal self and has no concerns for going home. Pt is safe to return home and does not need further PT services.     Follow Up Recommendations No PT follow up    Equipment Recommendations  None recommended by PT    Recommendations for Other Services       Precautions / Restrictions Precautions Precautions: None      Mobility  Bed Mobility Overal bed mobility: Modified Independent             General bed mobility comments: increased time, HOB 20 degrees  Transfers Overall transfer level: Independent                  Ambulation/Gait Ambulation/Gait assistance: Independent Gait Distance (Feet): 600 Feet Assistive device: None Gait Pattern/deviations: WFL(Within Functional Limits)   Gait velocity interpretation: >4.37 ft/sec, indicative of normal walking speed General Gait Details: pt ambulates with normal gait pattern and no episodes of LOB or unsteadiness, pt reports it was his normal gait pattern, pt able to ambulate with horizontal and vertical head turns, changes in speed and stepping over obstacles  Stairs Stairs: Yes Stairs assistance: Independent Stair Management: One rail Left;Alternating pattern Number of Stairs: 4 General stair comments: pt safe on stairs   Wheelchair Mobility    Modified Rankin (Stroke Patients  Only)       Balance Overall balance assessment: Independent(able to maintain balance with feet together and in tandem stance x30 sec)                               Standardized Balance Assessment Standardized Balance Assessment : Dynamic Gait Index   Dynamic Gait Index Level Surface: Normal Change in Gait Speed: Normal Gait with Horizontal Head Turns: Normal Gait with Vertical Head Turns: Normal Gait and Pivot Turn: Normal Step Over Obstacle: Normal Step Around Obstacles: Normal Steps: Mild Impairment Total Score: 23       Pertinent Vitals/Pain Pain Assessment: No/denies pain    Home Living Family/patient expects to be discharged to:: Private residence Living Arrangements: Spouse/significant other Available Help at Discharge: Family;Available 24 hours/day Type of Home: House Home Access: Stairs to enter Entrance Stairs-Rails: Left;Right;Can reach both Entrance Stairs-Number of Steps: 4 Home Layout: Two level;Able to live on main level with bedroom/bathroom Home Equipment: Kasandra Knudsen - single point Additional Comments: retired Furniture conservator/restorer    Prior Function Level of Independence: Independent               Journalist, newspaper        Extremity/Trunk Assessment   Upper Extremity Assessment Upper Extremity Assessment: Overall WFL for tasks assessed    Lower Extremity Assessment Lower Extremity Assessment: Overall WFL for tasks assessed    Cervical / Trunk Assessment Cervical / Trunk Assessment: Normal  Communication   Communication: No difficulties  Cognition Arousal/Alertness: Awake/alert Behavior During Therapy: WFL for tasks  assessed/performed Overall Cognitive Status: Within Functional Limits for tasks assessed                                        General Comments      Exercises     Assessment/Plan    PT Assessment Patent does not need any further PT services  PT Problem List         PT Treatment Interventions       PT Goals (Current goals can be found in the Care Plan section)  Acute Rehab PT Goals Patient Stated Goal: return home and to woodworking  PT Goal Formulation: With patient Time For Goal Achievement: 09/16/18 Potential to Achieve Goals: Good    Frequency     Barriers to discharge        Co-evaluation               AM-PAC PT "6 Clicks" Mobility  Outcome Measure Help needed turning from your back to your side while in a flat bed without using bedrails?: None Help needed moving from lying on your back to sitting on the side of a flat bed without using bedrails?: None Help needed moving to and from a bed to a chair (including a wheelchair)?: None Help needed standing up from a chair using your arms (e.g., wheelchair or bedside chair)?: None Help needed to walk in hospital room?: None Help needed climbing 3-5 steps with a railing? : None 6 Click Score: 24    End of Session Equipment Utilized During Treatment: Gait belt Activity Tolerance: Patient tolerated treatment well Patient left: in chair;with call bell/phone within reach;with family/visitor present Nurse Communication: Mobility status PT Visit Diagnosis: Other abnormalities of gait and mobility (R26.89)    Time: 4287-6811 PT Time Calculation (min) (ACUTE ONLY): 23 min   Charges:   PT Evaluation $PT Eval Low Complexity: [place], SPT [phone]   Gerson Fauth 09/09/2018, 8:07 AM

## 2018-09-09 NOTE — Progress Notes (Signed)
Inpatient Diabetes Program Recommendations  AACE/ADA: New Consensus Statement on Inpatient Glycemic Control (2015)  Target Ranges:  Prepandial:   less than 140 mg/dL      Peak postprandial:   less than 180 mg/dL (1-2 hours)      Critically ill patients:  140 - 180 mg/dL   Lab Results  Component Value Date   GLUCAP 169 (H) 09/08/2018   HGBA1C 7.5 (H) 05/24/2018    Review of Glycemic Control  Diabetes history: DM 2 Outpatient Diabetes medications: Metformin 500 mg BID Current orders for Inpatient glycemic control: Metformin 500 mg BID  Inpatient Diabetes Program Recommendations:    Patient has a history of DM. Consider the following: - ordering Carb modified diet - CBGs ACHS - Novolog 0-9 units tid   Thanks,  Tama Headings RN, MSN, BC-ADM Inpatient Diabetes Coordinator Team Pager [phone] (8a-5p)

## 2018-09-10 NOTE — Discharge Summary (Signed)
Physician Discharge Summary  Patient ID: Brandon Stone MRN: [id number] DOB/AGE: 1945/09/15 73 y.o.  Admit date: 09/08/2018 Discharge date: 09/10/2018  Admission Diagnoses:subdural hematoma  Discharge Diagnoses: same Active Problems:   Subdural hematoma [facility])   Discharged Condition: good  Hospital Course: patient is medical Hospital from the emergency department noted to have a parafalcine subdural hematoma serial CTs have shown this to be stable patient is neurologically intact feels much better has headache controlled with Tylenol. Patient stable for discharge home. I've explained all the precautions patient be discharged with head injury sheet and scheduled follow-up with Dr. Ronnald Ramp in 1-2 weeks and instructed them to call Dr. Wendall Stade cardiologist for follow-up as well. Patient was instructed to stay off of his aspirin and Plavix until follow-up with Dr. Ronnald Ramp.  Consults: None  Significant Diagnostic Studies: CT scans  Treatments: observation  Discharge Exam: Blood pressure (!) 145/95, pulse 68, temperature 98 F (36.7 C), temperature source Oral, resp. rate 16, height _0  (1.803 m), weight 99.8 kg, SpO2 96 %. awake alert doing well neurologically nonfocal  Disposition: Discharge disposition: 01-Home or Self Care        Allergies as of 09/10/2018      Reactions   Other Hives   Intolerance to strong pain medications Rash to opsite and tegaderm tape.   Ticlopidine Hcl Hives, Itching   Black Pepper [piper] Palpitations   HEART PALPITATIONS HEART PALPITATIONS   Codeine Palpitations, Other (See Comments)   Wild dreams, palpitations "wild dreams"      Medication List    STOP taking these medications   aspirin EC 81 MG tablet   clopidogrel 75 MG tablet Commonly known as:  PLAVIX     TAKE these medications   acetaminophen 325 MG tablet Commonly known as:  TYLENOL Take 325 mg by mouth every 6 (six) hours as needed for mild pain.   allopurinol 300 MG  tablet Commonly known as:  ZYLOPRIM Take 1 tablet (300 mg total) by mouth daily.   amLODipine 5 MG tablet Commonly known as:  NORVASC Take 1 tablet (5 mg total) by mouth 2 (two) times daily.   atorvastatin 80 MG tablet Commonly known as:  LIPITOR TAKE 1 TABLET EVERY DAY   blood glucose meter kit and supplies Dispense based on patient and insurance preference. Use up to four times daily as directed. (FOR ICD-10 E10.9, E11.9).   ezetimibe 10 MG tablet Commonly known as:  Zetia Take 1 tablet (10 mg total) by mouth daily.   Fish Oil 1200 MG Caps Take 1 capsule by mouth every evening.   IMODIUM PO Take 1 tablet by mouth daily as needed (loosse stools).   isosorbide mononitrate 60 MG 24 hr tablet Commonly known as:  IMDUR TAKE 1 TABLET EVERY DAY   loratadine 10 MG tablet Commonly known as:  CLARITIN Take 10 mg by mouth daily as needed for allergies.   metFORMIN 500 MG tablet Commonly known as:  GLUCOPHAGE Take 1 tablet (500 mg total) by mouth 2 (two) times daily with a meal.   metoprolol succinate 25 MG 24 hr tablet Commonly known as:  TOPROL-XL TAKE 1 TABLET TWICE DAILY   MULTIPLE VITAMIN-FOLIC ACID PO Take 1 tablet by mouth daily. Contains 099 mg folic acid   nitroGLYCERIN 0.4 MG SL tablet Commonly known as:  NITROSTAT Place 1 tablet (0.4 mg total) under the tongue every 5 (five) minutes as needed for chest pain.   omeprazole 20 MG capsule Commonly known as:  PRILOSEC Take 1  capsule (20 mg total) by mouth 2 (two) times daily.   potassium chloride SA 20 MEQ tablet Commonly known as:  K-DUR,KLOR-CON Take 1 tablet (20 mEq total) by mouth every evening.   terbinafine 250 MG tablet Commonly known as:  LAMISIL Take 1 tablet (250 mg total) by mouth daily. Take for 3 months for toenail fungus   triamcinolone cream 0.1 % Commonly known as:  KENALOG Apply 1 application topically 2 (two) times daily. Use as needed for dry or itchy skin   valsartan-hydrochlorothiazide  160-25 MG tablet Commonly known as:  DIOVAN-HCT TAKE 1 TABLET EVERY DAY        Signed: Selene Peltzer P 09/10/2018, 8:34 AM

## 2018-09-12 ENCOUNTER — Other Ambulatory Visit: Payer: Self-pay | Admitting: Internal Medicine

## 2018-09-15 ENCOUNTER — Telehealth: Payer: Self-pay | Admitting: Internal Medicine

## 2018-09-15 NOTE — Telephone Encounter (Signed)
Left msg at neurosurgery office Dr Ronnald Ramp Would resume aspirin ecASA 81 mg when OK from neurosurgical standpoint Pt has appt in the next couple wks with him

## 2018-09-21 DIAGNOSIS — S065X0A Traumatic subdural hemorrhage without loss of consciousness, initial encounter: Secondary | ICD-10-CM | POA: Diagnosis not present

## 2018-09-22 ENCOUNTER — Other Ambulatory Visit: Payer: Self-pay | Admitting: Neurological Surgery

## 2018-09-22 DIAGNOSIS — S065XAA Traumatic subdural hemorrhage with loss of consciousness status unknown, initial encounter: Secondary | ICD-10-CM

## 2018-09-22 DIAGNOSIS — S065X9A Traumatic subdural hemorrhage with loss of consciousness of unspecified duration, initial encounter: Secondary | ICD-10-CM

## 2018-09-24 ENCOUNTER — Encounter: Payer: Self-pay | Admitting: Gastroenterology

## 2018-09-27 ENCOUNTER — Ambulatory Visit
Admission: RE | Admit: 2018-09-27 | Discharge: 2018-09-27 | Disposition: A | Discharge status: Home / Self Care | Payer: Medicare HMO | Source: Ambulatory Visit | Attending: Neurological Surgery | Admitting: Neurological Surgery

## 2018-09-27 ENCOUNTER — Other Ambulatory Visit: Payer: Self-pay

## 2018-09-27 DIAGNOSIS — S065XAA Traumatic subdural hemorrhage with loss of consciousness status unknown, initial encounter: Secondary | ICD-10-CM

## 2018-09-27 DIAGNOSIS — R51 Headache: Secondary | ICD-10-CM | POA: Diagnosis not present

## 2018-09-27 DIAGNOSIS — S065X9A Traumatic subdural hemorrhage with loss of consciousness of unspecified duration, initial encounter: Secondary | ICD-10-CM

## 2018-09-28 DIAGNOSIS — S065X0A Traumatic subdural hemorrhage without loss of consciousness, initial encounter: Secondary | ICD-10-CM | POA: Diagnosis not present

## 2018-09-29 ENCOUNTER — Other Ambulatory Visit: Payer: Self-pay | Admitting: Family Medicine

## 2018-09-29 ENCOUNTER — Ambulatory Visit: Payer: Medicare HMO | Admitting: *Deleted

## 2018-09-29 DIAGNOSIS — K219 Gastro-esophageal reflux disease without esophagitis: Secondary | ICD-10-CM

## 2018-09-30 ENCOUNTER — Encounter: Payer: Self-pay | Admitting: *Deleted

## 2018-09-30 ENCOUNTER — Ambulatory Visit (INDEPENDENT_AMBULATORY_CARE_PROVIDER_SITE_OTHER): Payer: Medicare HMO | Admitting: *Deleted

## 2018-09-30 ENCOUNTER — Other Ambulatory Visit: Payer: Self-pay

## 2018-09-30 ENCOUNTER — Ambulatory Visit: Payer: Medicare HMO | Admitting: *Deleted

## 2018-09-30 DIAGNOSIS — Z0000 Encounter for general adult medical examination without abnormal findings: Secondary | ICD-10-CM | POA: Diagnosis not present

## 2018-09-30 NOTE — Progress Notes (Addendum)
I connected with patient 09/30/18 at 10:00 AM EDT by a video enabled telemedicine application and verified that I am speaking with the correct person using two identifiers.   Subjective:   Brandon Stone is a 73 y.o. male who presents for Medicare Annual/Subsequent preventive examination.  Review of Systems:  No ROS.  Medicare Wellness Visit. Additional risk factors are reflected in the social history.  Sleep patterns: 8-9 hrs per night. Feels rested usually.  Home Safety/Smoke Alarms: Feels safe in home. Smoke alarms in place.  Lives in 2 story home with wife and dog. Step over tub.   Male:   CCS- last 07/21/16. due 2021   Eye- yearly with Dr.Snipes in [place].     PSA-  Lab Results  Component Value Date   PSA 0.80 01/14/2018   PSA 0.82 08/27/2015        Objective:    Vitals:  Unable to assess. This visit is enabled though telemedicine due to Covid 19.  Pt reports BP=136/83, HR=66, Wt 211.4lb, CBG=136 this morning.  Advanced Directives 09/30/2018 09/08/2018 09/08/2018 08/16/2018 09/28/2017 06/11/2017 09/11/2016  Does Patient Have a Medical Advance Directive? _0  No No  Would patient like information on creating a medical advance directive? No - Patient declined No - Patient declined - - No - Patient declined - No - Patient declined  Pre-existing out of facility DNR order (yellow form or pink MOST form) - - - - - - -    Tobacco Social History   Tobacco Use  Smoking Status Former Smoker  . Years: 8.00  . Last attempt to quit: 06/24/1971  . Years since quitting: 47.3  Smokeless Tobacco Never Used     Counseling given: Not Answered   Clinical Intake:     Pain Score: 0-No pain                 Past Medical History:  Diagnosis Date  . Anginal pain ([place])   . Arthritis   . Barrett's esophagus   . BPH (benign prostatic hypertrophy)   . CAD (coronary artery disease)     a. CABG 1991;  b.  [place];  c.  S/P PTCA  stents to SVG ot OM last in  2003;  d.  Redo CABG x 2 in 2004 (VG->OM->LCX, LRA->PDA);  e. 12/2011 Cath: 3vd, sev LM into LCX dzs, VG->OM 100, LIMA->DIAG->LAD patent, Rad Art->PDA patent, EF 45%, Med Rx.  . Diverticulosis   . DM (diabetes mellitus) ([place])   . Dyslipidemia   . Dyspnea   . Gallstones   . GERD (gastroesophageal reflux disease)   . Gout   . History of hiatal hernia   . HTN (hypertension)   . Hyperlipidemia   . Leukemia in remission ([place])   . Psoriasis   . Rheumatic fever    as a child  . S/P coronary artery stent placement 1998  . TIA (transient ischemic attack)    FOUND ON MRI  PER PATIENT-     NO PROBLEM   Past Surgical History:  Procedure Laterality Date  . APPENDECTOMY    . CHOLECYSTECTOMY N/A 09/18/2016   Procedure: LAPAROSCOPIC CHOLECYSTECTOMY WITH INTRAOPERATIVE CHOLANGIOGRAM;  Surgeon: Jackolyn Confer, MD;  Location: [facility];  Service: General;  Laterality: N/A;  . COLONOSCOPY  07/2013  . CORONARY ANGIOPLASTY     STENTS  . CORONARY ARTERY BYPASS GRAFT     1991 (LIMA to LAD/Diag;  SVG to RCA;  SVG to OM)  .  CORONARY ARTERY BYPASS GRAFT     2004 (L radial to PDA; SVG to OM1/ distal LCx)  . TONSILLECTOMY AND ADENOIDECTOMY    . UMBILICAL HERNIA REPAIR     Family History  Problem Relation Age of Onset  . Heart disease Mother   . Hypertension Mother   . Hyperlipidemia Mother   . Diabetes Mother   . Skin cancer Mother        skin  . Heart disease Father   . Hypertension Father   . Hyperlipidemia Father   . Diabetes Father   . Stroke Sister   . Stroke Brother   . Colon cancer Neg Hx    Social History   Socioeconomic History  . Marital status: Married    Spouse name: Not on file  . Number of children: 2  . Years of education: Not on file  . Highest education level: Not on file  Occupational History  . Occupation: retired  Social Needs  . Financial resource strain: Not on file  . Food insecurity:    Worry: Not on file    Inability: Not on file  . Transportation needs:     Medical: Not on file    Non-medical: Not on file  Tobacco Use  . Smoking status: Former Smoker    Years: 8.00    Last attempt to quit: 06/24/1971    Years since quitting: 47.3  . Smokeless tobacco: Never Used  Substance and Sexual Activity  . Alcohol use: No  . Drug use: No  . Sexual activity: Yes  Lifestyle  . Physical activity:    Days per week: Not on file    Minutes per session: Not on file  . Stress: Not on file  Relationships  . Social connections:    Talks on phone: Not on file    Gets together: Not on file    Attends religious service: Not on file    Active member of club or organization: Not on file    Attends meetings of clubs or organizations: Not on file    Relationship status: Not on file  Other Topics Concern  . Not on file  Social History Narrative  . Not on file    Outpatient Encounter Medications as of 09/30/2018  Medication Sig  . acetaminophen (TYLENOL) 325 MG tablet Take 325 mg by mouth every 6 (six) hours as needed for mild pain.  . allopurinol (ZYLOPRIM) 300 MG tablet Take 1 tablet (300 mg total) by mouth daily.  . amLODipine (NORVASC) 5 MG tablet Take 1 tablet (5 mg total) by mouth 2 (two) times daily.  . atorvastatin (LIPITOR) 80 MG tablet TAKE 1 TABLET EVERY DAY  . blood glucose meter kit and supplies Dispense based on patient and insurance preference. Use up to four times daily as directed. (FOR ICD-10 E10.9, E11.9).  . ezetimibe (ZETIA) 10 MG tablet Take 1 tablet (10 mg total) by mouth daily.  . isosorbide mononitrate (IMDUR) 60 MG 24 hr tablet TAKE 1 TABLET EVERY DAY  . Loperamide HCl (IMODIUM PO) Take 1 tablet by mouth daily as needed (loosse stools).  . loratadine (CLARITIN) 10 MG tablet Take 10 mg by mouth daily as needed for allergies.  . metFORMIN (GLUCOPHAGE) 500 MG tablet Take 1 tablet (500 mg total) by mouth 2 (two) times daily with a meal.  . metoprolol succinate (TOPROL-XL) 25 MG 24 hr tablet TAKE 1 TABLET TWICE DAILY  . MULTIPLE  VITAMIN-FOLIC ACID PO Take 1 tablet by mouth daily.   Contains 244 mg folic acid  . Omega-3 Fatty Acids (FISH OIL) 1200 MG CAPS Take 1 capsule by mouth every evening.   Marland Kitchen omeprazole (PRILOSEC) 20 MG capsule TAKE 1 CAPSULE TWICE DAILY  . potassium chloride SA (K-DUR,KLOR-CON) 20 MEQ tablet TAKE 1 TABLET EVERY EVENING  . terbinafine (LAMISIL) 250 MG tablet Take 1 tablet (250 mg total) by mouth daily. Take for 3 months for toenail fungus  . triamcinolone cream (KENALOG) 0.1 % Apply 1 application topically 2 (two) times daily. Use as needed for dry or itchy skin  . valsartan-hydrochlorothiazide (DIOVAN-HCT) 160-25 MG tablet TAKE 1 TABLET EVERY DAY  . nitroGLYCERIN (NITROSTAT) 0.4 MG SL tablet Place 1 tablet (0.4 mg total) under the tongue every 5 (five) minutes as needed for chest pain. (Patient not taking: Reported on 09/30/2018)  . [DISCONTINUED] omeprazole (PRILOSEC) 20 MG capsule Take 1 capsule (20 mg total) by mouth 2 (two) times daily.  . [DISCONTINUED] potassium chloride SA (K-DUR,KLOR-CON) 20 MEQ tablet Take 1 tablet (20 mEq total) by mouth every evening.   No facility-administered encounter medications on file as of 09/30/2018.     Activities of Daily Living In your present state of health, do you have any difficulty performing the following activities: 09/30/2018 09/08/2018  Hearing? N N  Vision? N N  Comment wears readers -  Difficulty concentrating or making decisions? N N  Walking or climbing stairs? N N  Dressing or bathing? N N  Doing errands, shopping? N N  Preparing Food and eating ? N -  Using the Toilet? N -  In the past six months, have you accidently leaked urine? N -  Do you have problems with loss of bowel control? N -  Managing your Medications? N -  Managing your Finances? N -  Housekeeping or managing your Housekeeping? N -  Some recent data might be hidden    Patient Care Team: Copland, Gay Filler, MD as PCP - General (Family Medicine) Fay Records, MD as PCP -  Cardiology (Cardiology) Magrinat, Virgie Dad, MD as Consulting Physician (Oncology)   Assessment:   This is a routine wellness examination for Brandon Stone. Physical assessment deferred to PCP.  Exercise Activities and Dietary recommendations Current Exercise Habits: Home exercise routine, Type of exercise: walking, Time (Minutes): 20, Frequency (Times/Week): 2, Weekly Exercise (Minutes/Week): 40, Intensity: Mild, Exercise limited by: None identified Diet (meal preparation, eat out, water intake, caffeinated beverages, dairy products, fruits and vegetables): 24 hr recall Breakfast: eggs or cereal Lunch: PB sandwich or fruit Snack: cookie Dinner:  Pasta and vegetables  Goals    . Increase physical activity       Fall Risk Fall Risk  09/30/2018 09/28/2017 02/05/2017  Falls in the past year? 0 No No    Depression Screen PHQ 2/9 Scores 09/30/2018 09/28/2017 02/05/2017  PHQ - 2 Score 0 0 0  Exception Documentation - - Patient refusal    Cognitive Function Ad8 score reviewed for issues:  Issues making decisions:no  Less interest in hobbies / activities:no  Repeats questions, stories (family complaining):no  Trouble using ordinary gadgets (microwave, computer, phone):no  Forgets the month or year: no  Mismanaging finances: no  Remembering appts:no  Daily problems with thinking and/or memory:no Ad8 score is=0   MMSE - Mini Mental State Exam 09/28/2017  Orientation to time 5  Orientation to Place 5  Registration 3  Attention/ Calculation 5  Recall 3  Language- name 2 objects 2  Language- repeat 1  Language- follow 3 step  command 3  Language- read & follow direction 1  Write a sentence 1        Immunization History  Administered Date(s) Administered  . Influenza, High Dose Seasonal PF 05/07/2015, 05/06/2016, 05/11/2017, 04/05/2018  . Pneumococcal Conjugate-13 05/11/2017  . Pneumococcal Polysaccharide-23 12/19/2014  . Tdap 12/19/2016  ] Screening Tests Health Maintenance   Topic Date Due  . HEMOGLOBIN A1C  11/23/2018  . INFLUENZA VACCINE  01/22/2019  . OPHTHALMOLOGY EXAM  05/13/2019  . FOOT EXAM  05/25/2019  . COLONOSCOPY  07/21/2026  . TETANUS/TDAP  12/20/2026  . Hepatitis C Screening  Completed  . PNA vac Low Risk Adult  Completed       Plan:    Please schedule your next medicare wellness visit with me in 1 yr.  Continue to eat heart healthy diet (full of fruits, vegetables, whole grains, lean protein, water--limit salt, fat, and sugar intake) and increase physical activity as tolerated.  Continue doing brain stimulating activities (puzzles, reading, adult coloring books, staying active) to keep memory sharp.    I have personally reviewed and noted the following in the patient's chart:   . Medical and social history . Use of alcohol, tobacco or illicit drugs  . Current medications and supplements . Functional ability and status . Nutritional status . Physical activity . Advanced directives . List of other physicians . Hospitalizations, surgeries, and ER visits in previous 12 months . Vitals . Screenings to include cognitive, depression, and falls . Referrals and appointments  In addition, I have reviewed and discussed with patient certain preventive protocols, quality metrics, and best practice recommendations. A written personalized care plan for preventive services as well as general preventive health recommendations were provided to patient.     Britt, Victoria Angel, RN  09/30/2018   I reviewed the above Medicare wellness visit by Ms. Britt, and agree with her documentation  J Copland MD  

## 2018-09-30 NOTE — Patient Instructions (Signed)
Please schedule your next medicare wellness visit with me in 1 yr.  Continue to eat heart healthy diet (full of fruits, vegetables, whole grains, lean protein, water--limit salt, fat, and sugar intake) and increase physical activity as tolerated.  Continue doing brain stimulating activities (puzzles, reading, adult coloring books, staying active) to keep memory sharp.    Brandon Stone , Thank you for taking time to come for your Medicare Wellness Visit. I appreciate your ongoing commitment to your health goals. Please review the following plan we discussed and let me know if I can assist you in the future.   These are the goals we discussed: Goals    . Increase physical activity       This is a list of the screening recommended for you and due dates:  Health Maintenance  Topic Date Due  . Hemoglobin A1C  11/23/2018  . Flu Shot  01/22/2019  . Eye exam for diabetics  05/13/2019  . Complete foot exam   05/25/2019  . Colon Cancer Screening  07/21/2026  . Tetanus Vaccine  12/20/2026  .  Hepatitis C: One time screening is recommended by Center for Disease Control  (CDC) for  adults born from [birth year] through 1963.   Completed  . Pneumonia vaccines  Completed    Health Maintenance After Age 45 After age 19, you are at a higher risk for certain long-term diseases and infections as well as injuries from falls. Falls are a major cause of broken bones and head injuries in people who are older than age 83. Getting regular preventive care can help to keep you healthy and well. Preventive care includes getting regular testing and making lifestyle changes as recommended by your health care provider. Talk with your health care provider about:  Which screenings and tests you should have. A screening is a test that checks for a disease when you have no symptoms.  A diet and exercise plan that is right for you. What should I know about screenings and tests to prevent falls? Screening and testing are the best  ways to find a health problem early. Early diagnosis and treatment give you the best chance of managing medical conditions that are common after age 34. Certain conditions and lifestyle choices may make you more likely to have a fall. Your health care provider may recommend:  Regular vision checks. Poor vision and conditions such as cataracts can make you more likely to have a fall. If you wear glasses, make sure to get your prescription updated if your vision changes.  Medicine review. Work with your health care provider to regularly review all of the medicines you are taking, including over-the-counter medicines. Ask your health care provider about any side effects that may make you more likely to have a fall. Tell your health care provider if any medicines that you take make you feel dizzy or sleepy.  Osteoporosis screening. Osteoporosis is a condition that causes the bones to get weaker. This can make the bones weak and cause them to break more easily.  Blood pressure screening. Blood pressure changes and medicines to control blood pressure can make you feel dizzy.  Strength and balance checks. Your health care provider may recommend certain tests to check your strength and balance while standing, walking, or changing positions.  Foot health exam. Foot pain and numbness, as well as not wearing proper footwear, can make you more likely to have a fall.  Depression screening. You may be more likely to have a fall  if you have a fear of falling, feel emotionally low, or feel unable to do activities that you used to do.  Alcohol use screening. Using too much alcohol can affect your balance and may make you more likely to have a fall. What actions can I take to lower my risk of falls? General instructions  Talk with your health care provider about your risks for falling. Tell your health care provider if: ? You fall. Be sure to tell your health care provider about all falls, even ones that seem  minor. ? You feel dizzy, sleepy, or off-balance.  Take over-the-counter and prescription medicines only as told by your health care provider. These include any supplements.  Eat a healthy diet and maintain a healthy weight. A healthy diet includes low-fat dairy products, low-fat (lean) meats, and fiber from whole grains, beans, and lots of fruits and vegetables. Home safety  Remove any tripping hazards, such as rugs, cords, and clutter.  Install safety equipment such as grab bars in bathrooms and safety rails on stairs.  Keep rooms and walkways well-lit. Activity   Follow a regular exercise program to stay fit. This will help you maintain your balance. Ask your health care provider what types of exercise are appropriate for you.  If you need a cane or walker, use it as recommended by your health care provider.  Wear supportive shoes that have nonskid soles. Lifestyle  Do not drink alcohol if your health care provider tells you not to drink.  If you drink alcohol, limit how much you have: ? 0-1 drink a day for women. ? 0-2 drinks a day for men.  Be aware of how much alcohol is in your drink. In the [place] one drink equals one typical bottle of beer (12 oz), one-half glass of wine (5 oz), or one shot of hard liquor (1 oz).  Do not use any products that contain nicotine or tobacco, such as cigarettes and e-cigarettes. If you need help quitting, ask your health care provider. Summary  Having a healthy lifestyle and getting preventive care can help to protect your health and wellness after age 91.  Screening and testing are the best way to find a health problem early and help you avoid having a fall. Early diagnosis and treatment give you the best chance for managing medical conditions that are more common for people who are older than age 80.  Falls are a major cause of broken bones and head injuries in people who are older than age 58. Take precautions to prevent a fall at home.   Work with your health care provider to learn what changes you can make to improve your health and wellness and to prevent falls. This information is not intended to replace advice given to you by your health care provider. Make sure you discuss any questions you have with your health care provider. Document Released: 04/22/2017 Document Revised: 04/22/2017 Document Reviewed: 04/22/2017 Elsevier Interactive Patient Education  2019 [employer].

## 2018-10-05 DIAGNOSIS — M542 Cervicalgia: Secondary | ICD-10-CM | POA: Diagnosis not present

## 2018-10-05 DIAGNOSIS — M47812 Spondylosis without myelopathy or radiculopathy, cervical region: Secondary | ICD-10-CM | POA: Diagnosis not present

## 2018-10-05 DIAGNOSIS — M9901 Segmental and somatic dysfunction of cervical region: Secondary | ICD-10-CM | POA: Diagnosis not present

## 2018-10-05 DIAGNOSIS — M4712 Other spondylosis with myelopathy, cervical region: Secondary | ICD-10-CM | POA: Diagnosis not present

## 2018-10-19 DIAGNOSIS — M4712 Other spondylosis with myelopathy, cervical region: Secondary | ICD-10-CM | POA: Diagnosis not present

## 2018-10-19 DIAGNOSIS — M47812 Spondylosis without myelopathy or radiculopathy, cervical region: Secondary | ICD-10-CM | POA: Diagnosis not present

## 2018-10-19 DIAGNOSIS — M9901 Segmental and somatic dysfunction of cervical region: Secondary | ICD-10-CM | POA: Diagnosis not present

## 2018-10-19 DIAGNOSIS — M542 Cervicalgia: Secondary | ICD-10-CM | POA: Diagnosis not present

## 2018-10-28 ENCOUNTER — Telehealth: Payer: Self-pay | Admitting: Internal Medicine

## 2018-10-28 DIAGNOSIS — R002 Palpitations: Secondary | ICD-10-CM

## 2018-10-28 DIAGNOSIS — R519 Headache, unspecified: Secondary | ICD-10-CM

## 2018-10-28 DIAGNOSIS — R42 Dizziness and giddiness: Secondary | ICD-10-CM

## 2018-10-28 NOTE — Telephone Encounter (Signed)
New Message     Pt c/o medication issue:  1. Name of Medication: Amlodipine  2. How are you currently taking this medication (dosage and times per day)? 2X Daily 5mg  tablet  3. Are you having a reaction (difficulty breathing--STAT)? No   4. What is your medication issue? Pt says he has been feeling bad for about 2 months now. Since the change in the medication dosage. He is still having Palpitations . Feels weak, feels like he has low blood sugar but he checked it and its not that

## 2018-10-28 NOTE — Telephone Encounter (Signed)
Pt notices days where he is weak, shaking, almost like blood sugar low - but it is not low.  Feeling fatigue.  Low energy. Thinks started after amlodipine was increased. Also around that time had fall/concussion Bigger concern today is palpitations - always has but have been more occasional.   Today very frequent.  Almost every other beat, feels like if he exhales hard or coughs they slow briefly but start right back up.   Gets SOB on exertion which is not new.  No CP. BP earlier was 125/65, HR 48 on auto machine While on phone w me 146/81, 57  Pt did not sleep well last night, yesterday had fully caffeinated coffee which he usually does not have and then he had some diarrhea today.   I adv him to rest and drink extra fluids the rest of afternoon.  Aware I am forwarding to Dr. Harrington Challenger for review and recommendations and will call him back.

## 2018-10-29 ENCOUNTER — Ambulatory Visit (HOSPITAL_COMMUNITY)
Admission: RE | Admit: 2018-10-29 | Discharge: 2018-10-29 | Disposition: A | Discharge status: Home / Self Care | Payer: Medicare HMO | Source: Ambulatory Visit | Attending: Internal Medicine | Admitting: Internal Medicine

## 2018-10-29 ENCOUNTER — Other Ambulatory Visit: Payer: Self-pay

## 2018-10-29 ENCOUNTER — Telehealth: Payer: Self-pay | Admitting: Internal Medicine

## 2018-10-29 DIAGNOSIS — R519 Headache, unspecified: Secondary | ICD-10-CM

## 2018-10-29 DIAGNOSIS — R51 Headache: Secondary | ICD-10-CM | POA: Insufficient documentation

## 2018-10-29 DIAGNOSIS — R002 Palpitations: Secondary | ICD-10-CM

## 2018-10-29 DIAGNOSIS — R42 Dizziness and giddiness: Secondary | ICD-10-CM

## 2018-10-29 MED ORDER — AMLODIPINE BESYLATE 5 MG PO TABS: 5.0000 mg | ORAL_TABLET | Freq: Every day | ORAL | 3 refills | Status: DC

## 2018-10-29 NOTE — Telephone Encounter (Signed)
Agree with plans as noted    Pt woke up OK   Felt palpitations on and off  SOme dizziness   Isolated BP 110s to 160s Went to garden store  On way back driving  VERY dizzy Got home   Took plants to garden  Kneeling   When got up VERY dizzy   Did not pass out  REcommendations as noted   Spoke to [employer]  Schedule head CT Pt says he is taking fluids  Cut back on meds Stop up/down activity   IF dizzy sit

## 2018-10-29 NOTE — Telephone Encounter (Signed)
Called pt    He had felt bad yesterday  Dizzy Palpitations  (see phone note    THis AM in bed felt fine   Sitting felt fine   Felt fine in BR  Went into yard  Initially felt good   Then felt bad coming back for garden store  Felt OK shopping  Dizzy   Very dizzy   No syncope    Started gardening   Felt bad   NO syncope   REcomm:  Event monitor CUt back on amlodipine to 5 daily    Head CT without contrast

## 2018-10-29 NOTE — Telephone Encounter (Signed)
Reviewed with Dr. Harrington Challenger Pt to go to [facility] radiology today for non contrast head CT for dizziness, headache and history of subdural hematoma. Pt aware to go to radiology if prior to five pm. Otherwise needs to register through the ER.  Aware to decrease amlodipine to daily. Aware a monitor will be mailed to his home to monitor rhythm, palpitaitons.

## 2018-10-29 NOTE — Telephone Encounter (Signed)
Spoke to pt today Today feels like bp is too low   Cant take it WOke up in bed  HR 60  eaven   Got up   Skipping    8 AM  167/96    141/73    P 59 or 63     11 AM  Dizzy   Felt like going to pass out   BP 130/69   P 52   Skips    116/68   70     121/66  67    Skpping slowing down but BPless

## 2018-11-01 ENCOUNTER — Telehealth: Payer: Self-pay | Admitting: *Deleted

## 2018-11-01 NOTE — Telephone Encounter (Signed)
Calling to follow up on prior auth for pts CT Friday 5/8.  They do not see that this was pre-certed.  Please call [facility], pre-service center, to follow up.  Josephine Igo: [phone], ext [phone]) Aware I will forward this to [facility], Dr. Alan Ripper nurse, to address when she returns to office.  Aware nurse if off today and she will follow up tomorrow/next day.

## 2018-11-02 ENCOUNTER — Telehealth: Payer: Self-pay | Admitting: Radiology

## 2018-11-02 DIAGNOSIS — M4712 Other spondylosis with myelopathy, cervical region: Secondary | ICD-10-CM | POA: Diagnosis not present

## 2018-11-02 DIAGNOSIS — M47812 Spondylosis without myelopathy or radiculopathy, cervical region: Secondary | ICD-10-CM | POA: Diagnosis not present

## 2018-11-02 DIAGNOSIS — M542 Cervicalgia: Secondary | ICD-10-CM | POA: Diagnosis not present

## 2018-11-02 DIAGNOSIS — M9901 Segmental and somatic dysfunction of cervical region: Secondary | ICD-10-CM | POA: Diagnosis not present

## 2018-11-02 NOTE — Telephone Encounter (Signed)
Enrolled patient for a 14 day Zio Telemetry monitor. Patient knows to expect monitor to arrive in 3-4 days

## 2018-11-02 NOTE — Telephone Encounter (Signed)
Left message at pre service center for [facility] to call me back.

## 2018-11-02 NOTE — Telephone Encounter (Signed)
Spoke to [employer], sent message to [employer].  They have done retro authorization.  All is complete.

## 2018-11-04 IMAGING — DX DG CHEST 2V
2 series · 2 of 2 positions shown · non-contrast
Comparison: November 10, 2016

CLINICAL DATA: Cough and fever

EXAM:
CHEST - 2 VIEW

[chest pa]
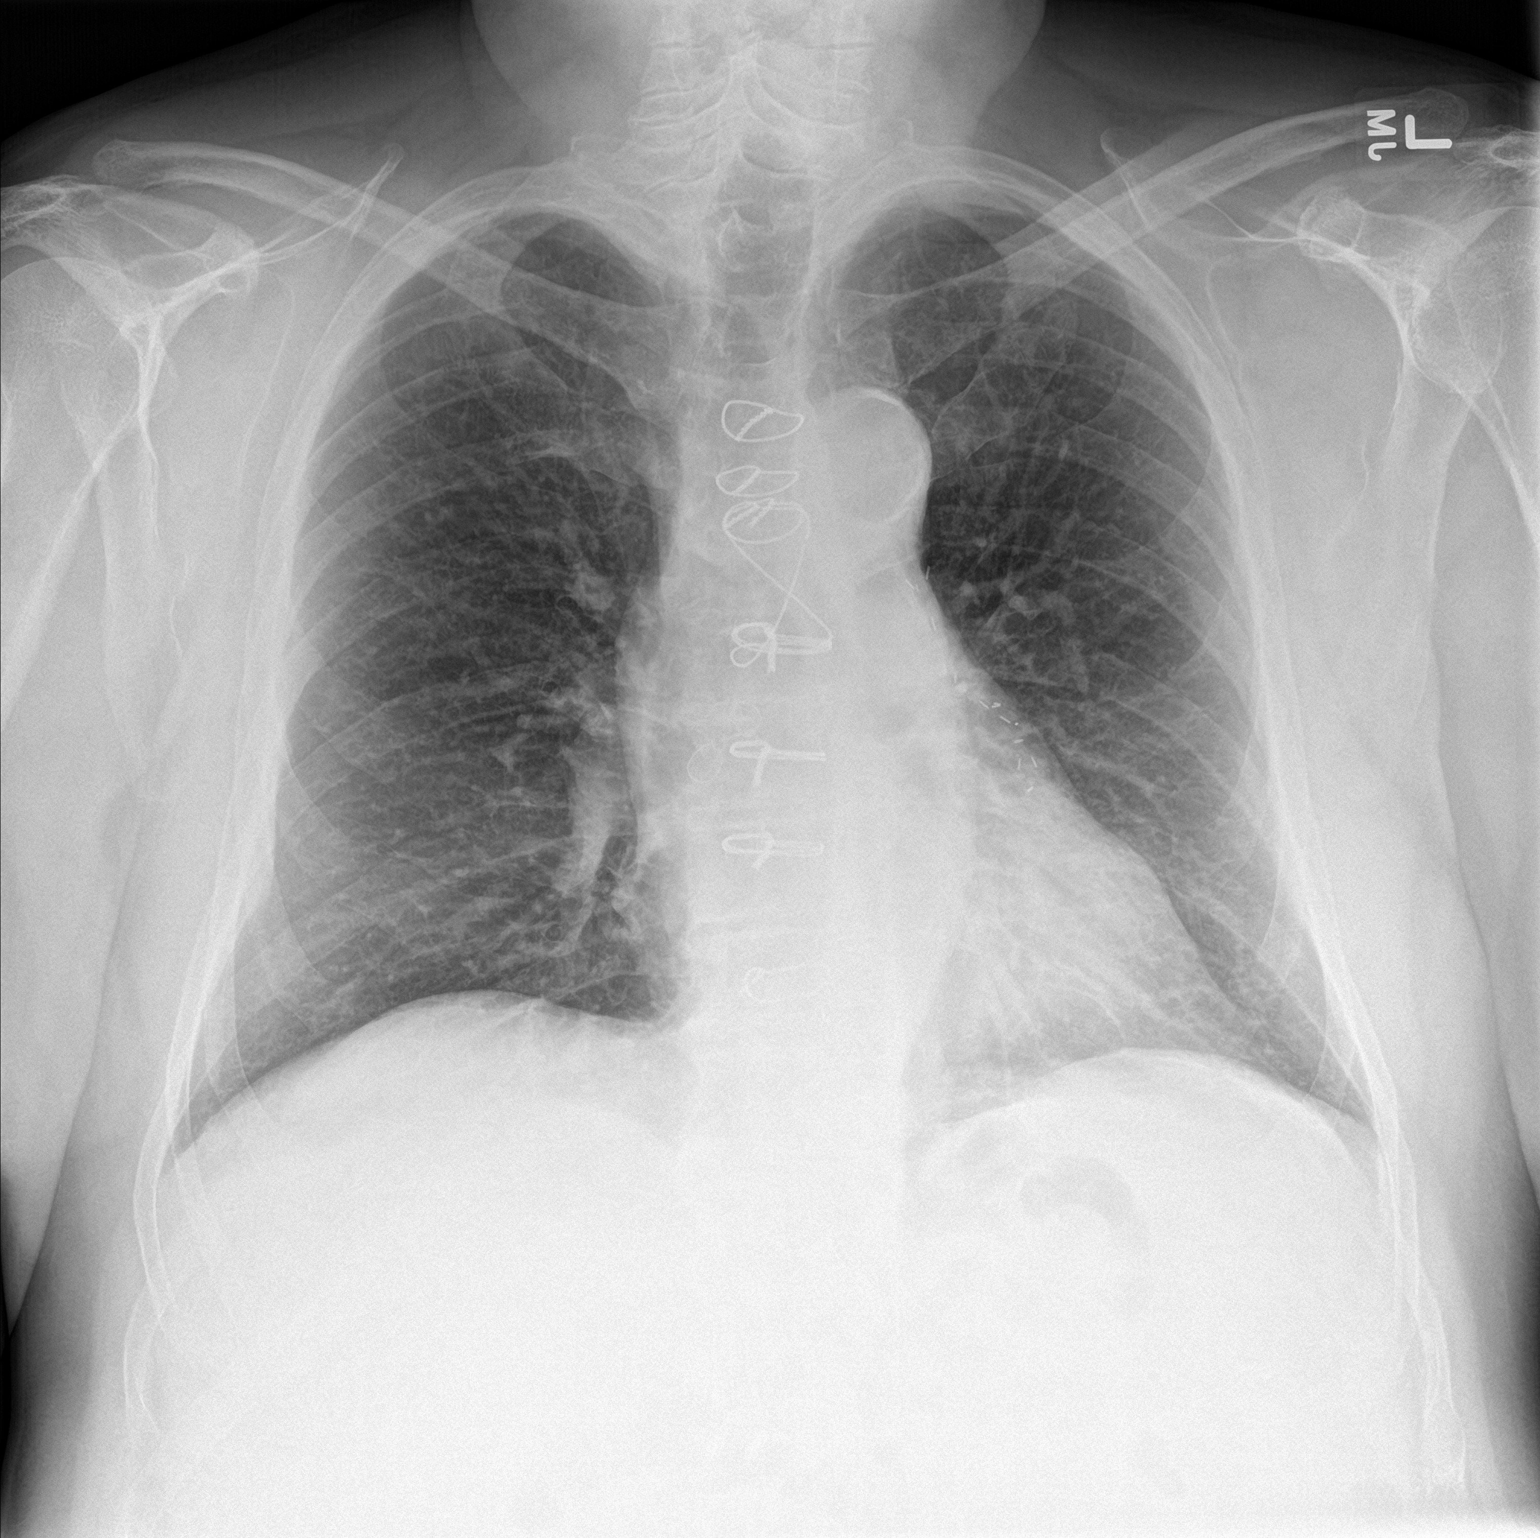

[chest lat]
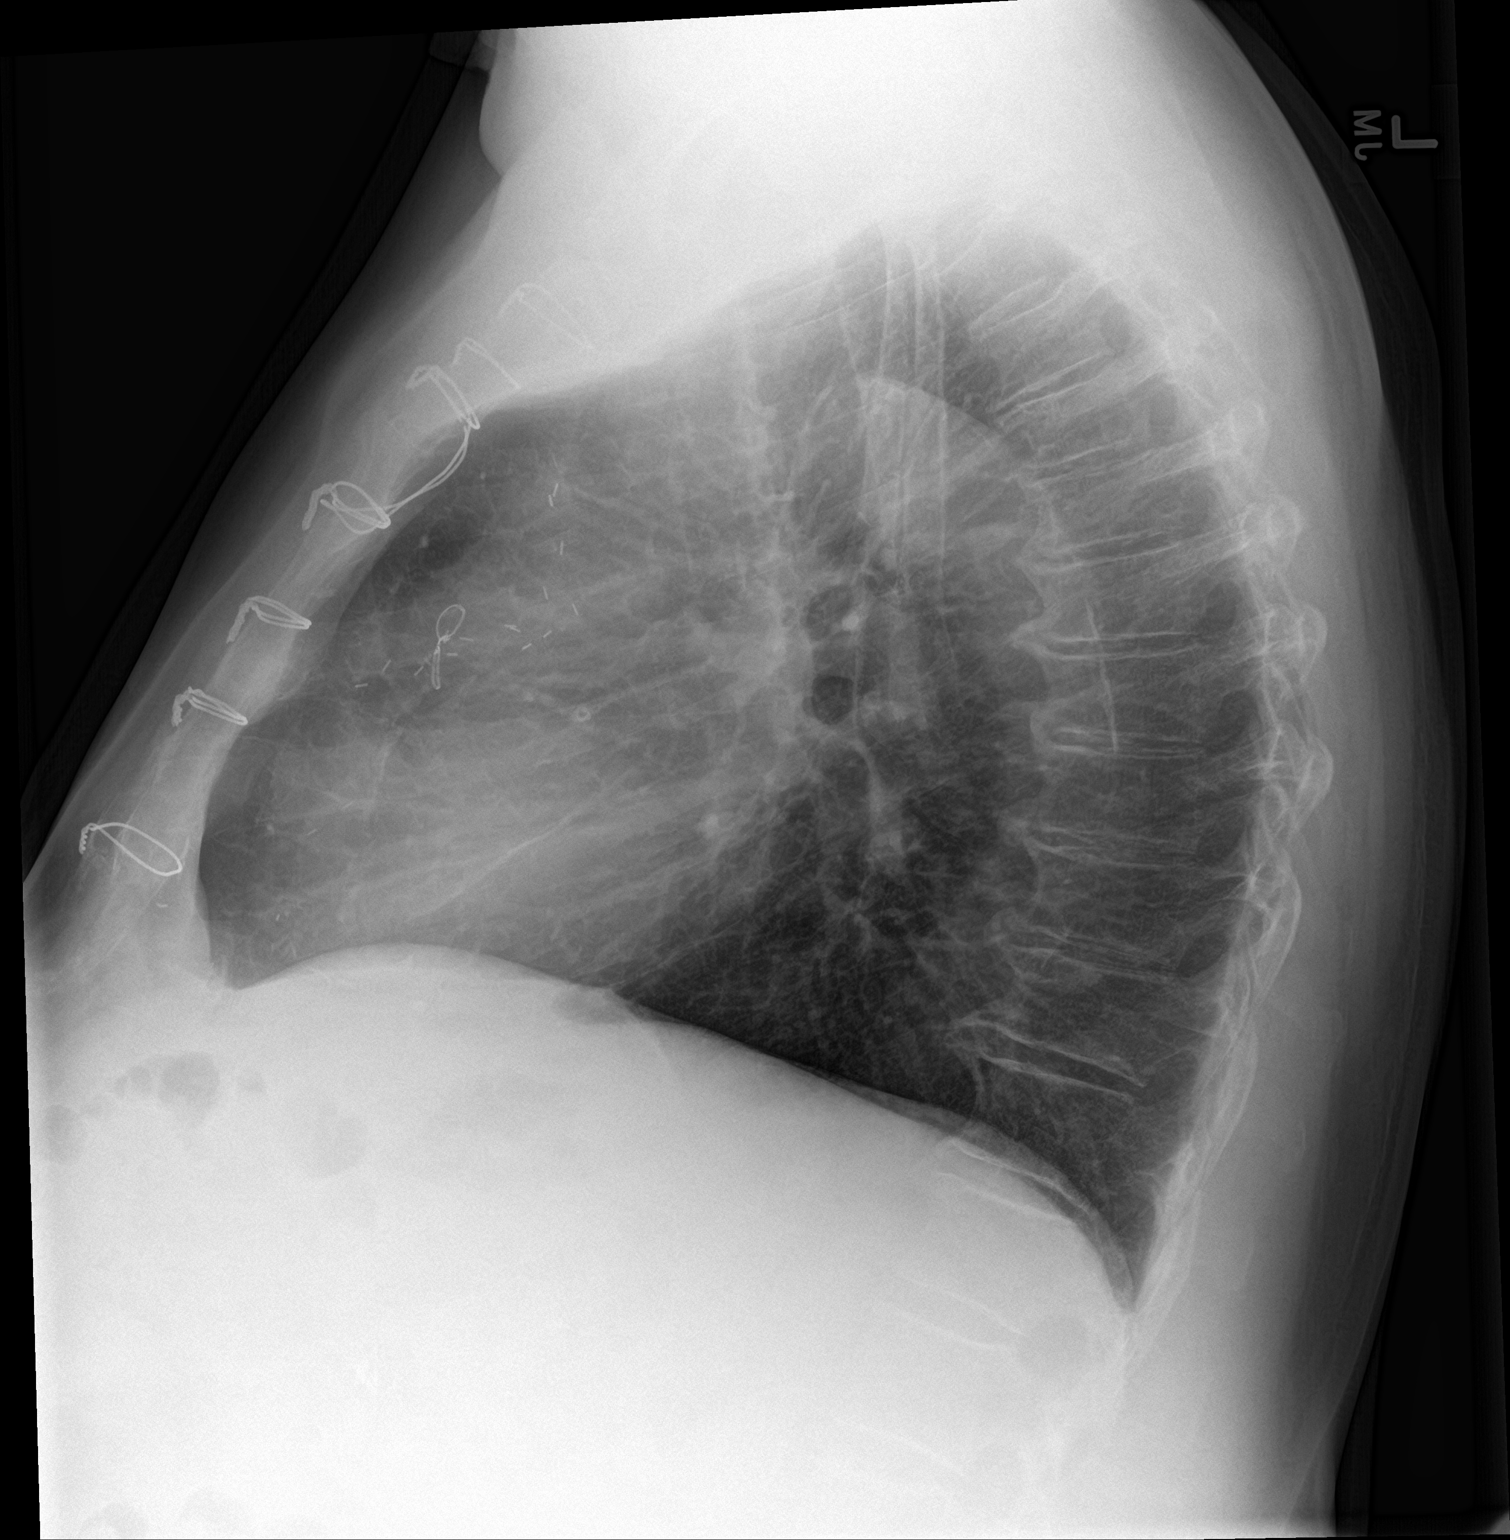

[2 of 2 positions shown; findings below may reference images not displayed]

FINDINGS: There is no edema or consolidation. Heart size and pulmonary
vascularity are normal. There is aortic atherosclerosis. Patient is
status post coronary artery bypass grafting. No adenopathy. There is
degenerative change in the thoracic spine.
IMPRESSION: No edema or consolidation. Stable cardiac silhouette. There is
aortic atherosclerosis.

Aortic Atherosclerosis ([id number]).

## 2018-11-05 MED ORDER — AMLODIPINE BESYLATE 5 MG PO TABS: 5.0000 mg | ORAL_TABLET | Freq: Every day | ORAL | 3 refills | Status: DC

## 2018-11-05 NOTE — Telephone Encounter (Signed)
[facility] mail order pharmacy requested Rx for pt, this medication was resent to mail order pharmacy. Confirmation received.

## 2018-11-05 NOTE — Addendum Note (Signed)
Addended by: Derl Barrow on: 11/05/2018 09:24 AM   Modules accepted: Orders

## 2018-11-09 ENCOUNTER — Ambulatory Visit (INDEPENDENT_AMBULATORY_CARE_PROVIDER_SITE_OTHER): Payer: Medicare HMO

## 2018-11-09 DIAGNOSIS — R002 Palpitations: Secondary | ICD-10-CM | POA: Diagnosis not present

## 2018-11-09 DIAGNOSIS — R51 Headache: Secondary | ICD-10-CM | POA: Diagnosis not present

## 2018-11-09 DIAGNOSIS — R42 Dizziness and giddiness: Secondary | ICD-10-CM

## 2018-11-09 DIAGNOSIS — R519 Headache, unspecified: Secondary | ICD-10-CM

## 2018-11-12 ENCOUNTER — Telehealth: Payer: Self-pay | Admitting: *Deleted

## 2018-11-12 NOTE — Telephone Encounter (Signed)
[facility] [phone] refill request line asking about filling Clopidogrel (Plavix) 75 mg. The medication is not on patient's medication list but is in the office note from 08/02/18. Please advise if patient is to continue taking. Thank you.

## 2018-11-12 NOTE — Telephone Encounter (Signed)
Patient should not be on plavix

## 2018-11-16 DIAGNOSIS — M4712 Other spondylosis with myelopathy, cervical region: Secondary | ICD-10-CM | POA: Diagnosis not present

## 2018-11-16 DIAGNOSIS — M47812 Spondylosis without myelopathy or radiculopathy, cervical region: Secondary | ICD-10-CM | POA: Diagnosis not present

## 2018-11-16 DIAGNOSIS — M9901 Segmental and somatic dysfunction of cervical region: Secondary | ICD-10-CM | POA: Diagnosis not present

## 2018-11-16 DIAGNOSIS — M542 Cervicalgia: Secondary | ICD-10-CM | POA: Diagnosis not present

## 2018-11-17 ENCOUNTER — Other Ambulatory Visit: Payer: Self-pay

## 2018-11-18 ENCOUNTER — Telehealth: Payer: Self-pay | Admitting: *Deleted

## 2018-11-18 NOTE — Telephone Encounter (Signed)
Per Dr. Harrington Challenger, patient was advised not to take Plavix. He stated that he has been taking for a month, but will not take anymore.

## 2018-11-22 ENCOUNTER — Other Ambulatory Visit: Payer: Self-pay | Admitting: Family Medicine

## 2018-11-22 DIAGNOSIS — Z8739 Personal history of other diseases of the musculoskeletal system and connective tissue: Secondary | ICD-10-CM

## 2018-11-29 ENCOUNTER — Telehealth: Payer: Self-pay

## 2018-11-29 DIAGNOSIS — E785 Hyperlipidemia, unspecified: Secondary | ICD-10-CM

## 2018-11-29 NOTE — Telephone Encounter (Signed)
-----   Message from Dorris Carnes V, MD sent at 11/28/2018  1:26 PM EDT ----- Called pt to review monitor    Says he is feeling good   Feeling better since he backed down on amlodipine REcomm that he come in for fasting lipid panel this week Told him someone from office would call to arrange

## 2018-11-29 NOTE — Telephone Encounter (Signed)
    COVID-19 Pre-Screening Questions:  . In the past 7 to 10 days have you had a cough,  shortness of breath, headache, congestion, fever (100 or greater) body aches, chills, sore throat, or sudden loss of taste or sense of smell? . Have you been around anyone with known Covid 19. . Have you been around anyone who is awaiting Covid 19 test results in the past 7 to 10 days? . Have you been around anyone who has been exposed to Covid 19, or has mentioned symptoms of Covid 19 within the past 7 to 10 days?  If you have any concerns/questions about symptoms patients report during screening (either on the phone or at threshold). Contact the provider seeing the patient or DOD for further guidance.  If neither are available contact a member of the leadership team.          Pt answered NO to all pre-screening questions. [phone] 11/29/2018

## 2018-11-29 NOTE — Telephone Encounter (Signed)
Notes recorded by Frederik Schmidt, RN on 11/29/2018 at 9:27 AM EDT The patient has been notified of the result and verbalized understanding. All questions (if any) were answered. Frederik Schmidt, RN 11/29/2018 9:27 AM

## 2018-11-30 ENCOUNTER — Other Ambulatory Visit: Payer: Medicare HMO

## 2018-11-30 ENCOUNTER — Other Ambulatory Visit: Payer: Self-pay

## 2018-11-30 DIAGNOSIS — M47812 Spondylosis without myelopathy or radiculopathy, cervical region: Secondary | ICD-10-CM | POA: Diagnosis not present

## 2018-11-30 DIAGNOSIS — M4712 Other spondylosis with myelopathy, cervical region: Secondary | ICD-10-CM | POA: Diagnosis not present

## 2018-11-30 DIAGNOSIS — E785 Hyperlipidemia, unspecified: Secondary | ICD-10-CM | POA: Diagnosis not present

## 2018-11-30 DIAGNOSIS — M542 Cervicalgia: Secondary | ICD-10-CM | POA: Diagnosis not present

## 2018-11-30 DIAGNOSIS — M9901 Segmental and somatic dysfunction of cervical region: Secondary | ICD-10-CM | POA: Diagnosis not present

## 2018-11-30 LAB — LIPID PANEL
Chol/HDL Ratio: 3.4 ratio (ref 0.0–5.0)
Cholesterol, Total: 109 mg/dL (ref 100–199)
HDL: 32 mg/dL — ABNORMAL LOW (ref >39)
LDL Calculated: 49 mg/dL (ref 0–99)
Triglycerides: 138 mg/dL (ref 0–149)
VLDL Cholesterol Cal: 28 mg/dL (ref 5–40)

## 2018-12-14 DIAGNOSIS — M4712 Other spondylosis with myelopathy, cervical region: Secondary | ICD-10-CM | POA: Diagnosis not present

## 2018-12-14 DIAGNOSIS — M9901 Segmental and somatic dysfunction of cervical region: Secondary | ICD-10-CM | POA: Diagnosis not present

## 2018-12-14 DIAGNOSIS — M47812 Spondylosis without myelopathy or radiculopathy, cervical region: Secondary | ICD-10-CM | POA: Diagnosis not present

## 2018-12-14 DIAGNOSIS — M542 Cervicalgia: Secondary | ICD-10-CM | POA: Diagnosis not present

## 2018-12-28 DIAGNOSIS — M47812 Spondylosis without myelopathy or radiculopathy, cervical region: Secondary | ICD-10-CM | POA: Diagnosis not present

## 2018-12-28 DIAGNOSIS — M4712 Other spondylosis with myelopathy, cervical region: Secondary | ICD-10-CM | POA: Diagnosis not present

## 2018-12-28 DIAGNOSIS — M9901 Segmental and somatic dysfunction of cervical region: Secondary | ICD-10-CM | POA: Diagnosis not present

## 2018-12-28 DIAGNOSIS — M542 Cervicalgia: Secondary | ICD-10-CM | POA: Diagnosis not present

## 2019-01-11 DIAGNOSIS — M4712 Other spondylosis with myelopathy, cervical region: Secondary | ICD-10-CM | POA: Diagnosis not present

## 2019-01-11 DIAGNOSIS — M47812 Spondylosis without myelopathy or radiculopathy, cervical region: Secondary | ICD-10-CM | POA: Diagnosis not present

## 2019-01-11 DIAGNOSIS — M9901 Segmental and somatic dysfunction of cervical region: Secondary | ICD-10-CM | POA: Diagnosis not present

## 2019-01-11 DIAGNOSIS — M542 Cervicalgia: Secondary | ICD-10-CM | POA: Diagnosis not present

## 2019-01-25 DIAGNOSIS — M542 Cervicalgia: Secondary | ICD-10-CM | POA: Diagnosis not present

## 2019-01-25 DIAGNOSIS — M9901 Segmental and somatic dysfunction of cervical region: Secondary | ICD-10-CM | POA: Diagnosis not present

## 2019-01-25 DIAGNOSIS — M47812 Spondylosis without myelopathy or radiculopathy, cervical region: Secondary | ICD-10-CM | POA: Diagnosis not present

## 2019-01-25 DIAGNOSIS — M4712 Other spondylosis with myelopathy, cervical region: Secondary | ICD-10-CM | POA: Diagnosis not present

## 2019-02-08 DIAGNOSIS — M9901 Segmental and somatic dysfunction of cervical region: Secondary | ICD-10-CM | POA: Diagnosis not present

## 2019-02-08 DIAGNOSIS — M47812 Spondylosis without myelopathy or radiculopathy, cervical region: Secondary | ICD-10-CM | POA: Diagnosis not present

## 2019-02-08 DIAGNOSIS — M4712 Other spondylosis with myelopathy, cervical region: Secondary | ICD-10-CM | POA: Diagnosis not present

## 2019-02-08 DIAGNOSIS — M542 Cervicalgia: Secondary | ICD-10-CM | POA: Diagnosis not present

## 2019-02-22 DIAGNOSIS — M47812 Spondylosis without myelopathy or radiculopathy, cervical region: Secondary | ICD-10-CM | POA: Diagnosis not present

## 2019-02-22 DIAGNOSIS — M4712 Other spondylosis with myelopathy, cervical region: Secondary | ICD-10-CM | POA: Diagnosis not present

## 2019-02-22 DIAGNOSIS — M9901 Segmental and somatic dysfunction of cervical region: Secondary | ICD-10-CM | POA: Diagnosis not present

## 2019-02-22 DIAGNOSIS — M542 Cervicalgia: Secondary | ICD-10-CM | POA: Diagnosis not present

## 2019-03-08 DIAGNOSIS — M542 Cervicalgia: Secondary | ICD-10-CM | POA: Diagnosis not present

## 2019-03-08 DIAGNOSIS — M4712 Other spondylosis with myelopathy, cervical region: Secondary | ICD-10-CM | POA: Diagnosis not present

## 2019-03-08 DIAGNOSIS — M9901 Segmental and somatic dysfunction of cervical region: Secondary | ICD-10-CM | POA: Diagnosis not present

## 2019-03-08 DIAGNOSIS — M47812 Spondylosis without myelopathy or radiculopathy, cervical region: Secondary | ICD-10-CM | POA: Diagnosis not present

## 2019-03-28 ENCOUNTER — Other Ambulatory Visit: Payer: Self-pay | Admitting: Family Medicine

## 2019-03-29 DIAGNOSIS — M4712 Other spondylosis with myelopathy, cervical region: Secondary | ICD-10-CM | POA: Diagnosis not present

## 2019-03-29 DIAGNOSIS — M542 Cervicalgia: Secondary | ICD-10-CM | POA: Diagnosis not present

## 2019-03-29 DIAGNOSIS — M47812 Spondylosis without myelopathy or radiculopathy, cervical region: Secondary | ICD-10-CM | POA: Diagnosis not present

## 2019-03-29 DIAGNOSIS — M9901 Segmental and somatic dysfunction of cervical region: Secondary | ICD-10-CM | POA: Diagnosis not present

## 2019-04-18 ENCOUNTER — Ambulatory Visit: Payer: Medicare HMO | Admitting: Hematology & Oncology

## 2019-04-18 ENCOUNTER — Inpatient Hospital Stay: Payer: Medicare HMO

## 2019-04-26 DIAGNOSIS — M9901 Segmental and somatic dysfunction of cervical region: Secondary | ICD-10-CM | POA: Diagnosis not present

## 2019-04-26 DIAGNOSIS — M47812 Spondylosis without myelopathy or radiculopathy, cervical region: Secondary | ICD-10-CM | POA: Diagnosis not present

## 2019-04-26 DIAGNOSIS — M4712 Other spondylosis with myelopathy, cervical region: Secondary | ICD-10-CM | POA: Diagnosis not present

## 2019-04-26 DIAGNOSIS — M542 Cervicalgia: Secondary | ICD-10-CM | POA: Diagnosis not present

## 2019-04-28 ENCOUNTER — Inpatient Hospital Stay: Payer: Medicare HMO

## 2019-04-28 ENCOUNTER — Ambulatory Visit: Payer: Medicare HMO | Admitting: Hematology & Oncology

## 2019-05-10 DIAGNOSIS — M47812 Spondylosis without myelopathy or radiculopathy, cervical region: Secondary | ICD-10-CM | POA: Diagnosis not present

## 2019-05-10 DIAGNOSIS — M542 Cervicalgia: Secondary | ICD-10-CM | POA: Diagnosis not present

## 2019-05-10 DIAGNOSIS — M9901 Segmental and somatic dysfunction of cervical region: Secondary | ICD-10-CM | POA: Diagnosis not present

## 2019-05-10 DIAGNOSIS — M4712 Other spondylosis with myelopathy, cervical region: Secondary | ICD-10-CM | POA: Diagnosis not present

## 2019-05-16 DIAGNOSIS — H25813 Combined forms of age-related cataract, bilateral: Secondary | ICD-10-CM | POA: Diagnosis not present

## 2019-05-16 DIAGNOSIS — D3131 Benign neoplasm of right choroid: Secondary | ICD-10-CM | POA: Diagnosis not present

## 2019-05-16 DIAGNOSIS — H5203 Hypermetropia, bilateral: Secondary | ICD-10-CM | POA: Diagnosis not present

## 2019-05-18 ENCOUNTER — Inpatient Hospital Stay: Payer: Medicare HMO | Admitting: Hematology & Oncology

## 2019-05-18 ENCOUNTER — Inpatient Hospital Stay: Payer: Medicare HMO

## 2019-05-22 ENCOUNTER — Other Ambulatory Visit: Payer: Self-pay | Admitting: Family Medicine

## 2019-05-22 DIAGNOSIS — Z8739 Personal history of other diseases of the musculoskeletal system and connective tissue: Secondary | ICD-10-CM

## 2019-05-23 NOTE — Telephone Encounter (Signed)
Pt has not been seen in one year with no upcoming appt scheduled. Pt was sent my chart message asking him to call and schedule appt.

## 2019-05-24 DIAGNOSIS — M542 Cervicalgia: Secondary | ICD-10-CM | POA: Diagnosis not present

## 2019-05-24 DIAGNOSIS — M4712 Other spondylosis with myelopathy, cervical region: Secondary | ICD-10-CM | POA: Diagnosis not present

## 2019-05-24 DIAGNOSIS — M47812 Spondylosis without myelopathy or radiculopathy, cervical region: Secondary | ICD-10-CM | POA: Diagnosis not present

## 2019-05-24 DIAGNOSIS — M9901 Segmental and somatic dysfunction of cervical region: Secondary | ICD-10-CM | POA: Diagnosis not present

## 2019-05-28 NOTE — Progress Notes (Signed)
[place] at [facility] [address] [place], [place] [address] [phone]  Date:  05/30/2019   Name:  Brandon Stone   DOB:  [birth year]   MRN:  [id number]  PCP:  Darreld Mclean, MD    Chief Complaint: No chief complaint on file.   History of Present Illness:  Brandon Stone is a 73 y.o. very pleasant male patient who presents with the following:  Virtual office visit today for medication follow-up Last seen by myself about 1 year ago Patient location is home, provider is at office Patient identity confirmed with 2 factors, he gives consent for virtual visit today The patient and myself are on the call today  History of CAD, hypertension, diabetes, hyperlipidemia, CLL, asymptomatic TIA, subdural hematoma, gout In March of this year he was on a ladder and fell- he got hit on the head and had a subdural hematoma  He has seen Dr. Ronnald Ramp with neurosurgery for his subdural hematoma- follow-up is done  He is feeling back to his normal self   He has enjoyed riding a bike for exercise and generally feels very well for age. He is being more careful since his fall and is not riding his bike right now He did start doing some walking for exercise again.  He has had stable angina for years. No change or worsening here He is able to minimize symptoms by regulating his exercise intensity, and avoiding cold air  He is seeing a chiropractor regularly for some neck pain.  They are doing 2 treatments a month to help prevent pain  Cardiologist is Dr. Harrington Challenger- he sees her every 6-12 months  His hematologist is Dr. York Pellant visit in February; appt is scheduled for later this month   Lab Results  Component Value Date   HGBA1C 7.5 (H) 05/24/2018   He could use an A1c and other routine labs-he wonders if I can order these labs to be done at hematology visit.  I can certainly order them, I think this will work out Eye exam; done last week or  two Foot exam when possible. He does note that he gets neuropathy at times but he uses an OTC cream that seems to help a lot.  He is not quite sure what cream it is  Immunizations are up-to-date except for flu and Shingrix Flu shot is done- per pharmacy  Discussed shingrix-he is thinking about it  Pt notes that he did have bronchitis in October of this year- otherwise he has felt well He is taking allopurinol for gout prophylaxis and it continues to work well for him  His wife was recently seen with postmenopausal bleeding, he is hopeful that she will be okay  Patient Active Problem List   Diagnosis Date Noted  . Subdural hematoma ([place]) 09/08/2018  . Diabetes mellitus without complication ([place]) 95/02/3266  . Symptomatic cholelithiasis 09/18/2016  . Chronic lymphocytic leukemia (CLL), B-cell ([place]) 05/13/2014  . Stable angina ([place]) 01/26/2012  . Sore throat 11/03/2010  . Hyperlipidemia 06/21/2008  . HYPERTENSION, BENIGN 06/21/2008  . CAD, NATIVE VESSEL 06/21/2008    Past Medical History:  Diagnosis Date  . Anginal pain ([place])   . Arthritis   . Barrett's esophagus   . BPH (benign prostatic hypertrophy)   . CAD (coronary artery disease)     a. CABG 1991;  b.  [place];  c.  S/P PTCA  stents to SVG ot OM last in 2003;  d.  Redo CABG x 2 in 2004 (VG->OM->LCX, LRA->PDA);  e. 12/2011 Cath: 3vd, sev LM into LCX dzs, VG->OM 100, LIMA->DIAG->LAD patent, Rad Art->PDA patent, EF 45%, Med Rx.  . Diverticulosis   . DM (diabetes mellitus) ([place])   . Dyslipidemia   . Dyspnea   . Gallstones   . GERD (gastroesophageal reflux disease)   . Gout   . History of hiatal hernia   . HTN (hypertension)   . Hyperlipidemia   . Leukemia in remission ([place])   . Psoriasis   . Rheumatic fever    as a child  . S/P coronary artery stent placement 1998  . TIA (transient ischemic attack)    FOUND ON MRI  PER PATIENT-     NO PROBLEM    Past Surgical History:  Procedure Laterality Date  . APPENDECTOMY     . CHOLECYSTECTOMY N/A 09/18/2016   Procedure: LAPAROSCOPIC CHOLECYSTECTOMY WITH INTRAOPERATIVE CHOLANGIOGRAM;  Surgeon: Jackolyn Confer, MD;  Location: [place];  Service: General;  Laterality: N/A;  . COLONOSCOPY  07/2013  . CORONARY ANGIOPLASTY     STENTS  . CORONARY ARTERY BYPASS GRAFT     1991 (LIMA to LAD/Diag;  SVG to RCA;  SVG to OM)  . CORONARY ARTERY BYPASS GRAFT     2004 (L radial to PDA; SVG to OM1/ distal LCx)  . TONSILLECTOMY AND ADENOIDECTOMY    . UMBILICAL HERNIA REPAIR      Social History   Tobacco Use  . Smoking status: Former Smoker    Years: 8.00    Quit date: 06/24/1971    Years since quitting: 47.9  . Smokeless tobacco: Never Used  Substance Use Topics  . Alcohol use: No  . Drug use: No    Family History  Problem Relation Age of Onset  . Heart disease Mother   . Hypertension Mother   . Hyperlipidemia Mother   . Diabetes Mother   . Skin cancer Mother        skin  . Heart disease Father   . Hypertension Father   . Hyperlipidemia Father   . Diabetes Father   . Stroke Sister   . Stroke Brother   . Colon cancer Neg Hx     Allergies  Allergen Reactions  . Other Hives    Intolerance to strong pain medications  Rash to opsite and tegaderm tape.  . Ticlopidine Hcl Hives and Itching  . Black Pepper [[place]] Palpitations    HEART PALPITATIONS HEART PALPITATIONS  . Codeine Palpitations and Other (See Comments)    Wild dreams, palpitations "wild dreams"    Medication list has been reviewed and updated.  Current Outpatient Medications on File Prior to Visit  Medication Sig Dispense Refill  . acetaminophen (TYLENOL) 325 MG tablet Take 325 mg by mouth every 6 (six) hours as needed for mild pain.    Marland Kitchen allopurinol (ZYLOPRIM) 300 MG tablet TAKE 1 TABLET (300 MG TOTAL) BY MOUTH DAILY. 90 tablet 1  . amLODipine (NORVASC) 5 MG tablet Take 1 tablet (5 mg total) by mouth daily. 90 tablet 3  . atorvastatin (LIPITOR) 80 MG tablet TAKE 1 TABLET EVERY DAY 90  tablet 3  . blood glucose meter kit and supplies Dispense based on patient and insurance preference. Use up to four times daily as directed. (FOR ICD-10 E10.9, E11.9). 1 each 0  . ezetimibe (ZETIA) 10 MG tablet Take 1 tablet (10 mg total) by mouth daily. 90 tablet 3  . isosorbide mononitrate (IMDUR) 60 MG  24 hr tablet TAKE 1 TABLET EVERY DAY 90 tablet 3  . Loperamide HCl (IMODIUM PO) Take 1 tablet by mouth daily as needed (loosse stools).    . loratadine (CLARITIN) 10 MG tablet Take 10 mg by mouth daily as needed for allergies.    . metFORMIN (GLUCOPHAGE) 500 MG tablet Take 1 tablet (500 mg total) by mouth 2 (two) times daily with a meal. 180 tablet 3  . metoprolol succinate (TOPROL-XL) 25 MG 24 hr tablet TAKE 1 TABLET TWICE DAILY 180 tablet 3  . MULTIPLE VITAMIN-FOLIC ACID PO Take 1 tablet by mouth daily. Contains 024 mg folic acid    . nitroGLYCERIN (NITROSTAT) 0.4 MG SL tablet Place 1 tablet (0.4 mg total) under the tongue every 5 (five) minutes as needed for chest pain. (Patient not taking: Reported on 09/30/2018) 25 tablet 3  . Omega-3 Fatty Acids (FISH OIL) 1200 MG CAPS Take 1 capsule by mouth every evening.     Marland Kitchen omeprazole (PRILOSEC) 20 MG capsule TAKE 1 CAPSULE TWICE DAILY 180 capsule 3  . potassium chloride SA (KLOR-CON) 20 MEQ tablet TAKE 1 TABLET EVERY EVENING 90 tablet 0  . terbinafine (LAMISIL) 250 MG tablet Take 1 tablet (250 mg total) by mouth daily. Take for 3 months for toenail fungus 90 tablet 0  . triamcinolone cream (KENALOG) 0.1 % Apply 1 application topically 2 (two) times daily. Use as needed for dry or itchy skin 30 g 2  . valsartan-hydrochlorothiazide (DIOVAN-HCT) 160-25 MG tablet TAKE 1 TABLET EVERY DAY 90 tablet 3   No current facility-administered medications on file prior to visit.     Review of Systems:  As per HPI- otherwise negative. No fever or chills  Physical Examination: There were no vitals filed for this visit. There were no vitals filed for this  visit. There is no height or weight on file to calculate BMI. Ideal Body Weight:    Spoke with pt on the phone -he sounds well.  No cough, wheezing, shortness of breath is noted BP this am was 150/90- pulse 65 He notes that his average BP seems to be fluctuating some.  SBP may run 120- 150s Glucose was 155 this am but he ate cake and sweets yesterday  Generally he will be about 133  BP Readings from Last 3 Encounters:  09/10/18 (!) 145/95  08/16/18 133/76  08/02/18 130/72    Assessment and Plan: Mixed hyperlipidemia - Plan: CBC, Lipid panel  History of gout - Plan: allopurinol (ZYLOPRIM) 300 MG tablet, Uric acid  Diabetes mellitus without complication ([place]) - Plan: metFORMIN (GLUCOPHAGE) 500 MG tablet, Comprehensive metabolic panel, Hemoglobin A1c  Atherosclerosis of native coronary artery of native heart without angina pectoris  Screening for prostate cancer - Plan: PSA, Medicare ( [place] only)  Spoke to pt for 18 minutes today on the telephone Ordered future labs to be done at upcoming visit with hematology if possible-if this is not possible, we can make another plan Refill medications as needed Discussed shingles vaccine and encouraged him to have this done His blood pressure has been a bit higher than ideal; we discussed having him increase his amlodipine to 7.5 mg.  He will try this and let me know how it works for him  Signed Lamar Blinks, MD

## 2019-05-30 ENCOUNTER — Ambulatory Visit (INDEPENDENT_AMBULATORY_CARE_PROVIDER_SITE_OTHER): Payer: Medicare HMO | Admitting: Family Medicine

## 2019-05-30 ENCOUNTER — Encounter: Payer: Self-pay | Admitting: Family Medicine

## 2019-05-30 ENCOUNTER — Other Ambulatory Visit: Payer: Self-pay

## 2019-05-30 DIAGNOSIS — Z8739 Personal history of other diseases of the musculoskeletal system and connective tissue: Secondary | ICD-10-CM

## 2019-05-30 DIAGNOSIS — Z125 Encounter for screening for malignant neoplasm of prostate: Secondary | ICD-10-CM | POA: Diagnosis not present

## 2019-05-30 DIAGNOSIS — E119 Type 2 diabetes mellitus without complications: Secondary | ICD-10-CM | POA: Diagnosis not present

## 2019-05-30 DIAGNOSIS — I2510 Atherosclerotic heart disease of native coronary artery without angina pectoris: Secondary | ICD-10-CM

## 2019-05-30 DIAGNOSIS — E782 Mixed hyperlipidemia: Secondary | ICD-10-CM | POA: Diagnosis not present

## 2019-05-30 MED ORDER — METFORMIN HCL 500 MG PO TABS: 500.0000 mg | ORAL_TABLET | Freq: Two times a day (BID) | ORAL | 3 refills | Status: DC

## 2019-05-30 MED ORDER — ALLOPURINOL 300 MG PO TABS: 300.0000 mg | ORAL_TABLET | Freq: Every day | ORAL | 3 refills | Status: DC

## 2019-06-07 DIAGNOSIS — M47812 Spondylosis without myelopathy or radiculopathy, cervical region: Secondary | ICD-10-CM | POA: Diagnosis not present

## 2019-06-07 DIAGNOSIS — M9901 Segmental and somatic dysfunction of cervical region: Secondary | ICD-10-CM | POA: Diagnosis not present

## 2019-06-07 DIAGNOSIS — M4712 Other spondylosis with myelopathy, cervical region: Secondary | ICD-10-CM | POA: Diagnosis not present

## 2019-06-07 DIAGNOSIS — M542 Cervicalgia: Secondary | ICD-10-CM | POA: Diagnosis not present

## 2019-06-09 ENCOUNTER — Other Ambulatory Visit: Payer: Self-pay | Admitting: Internal Medicine

## 2019-06-15 ENCOUNTER — Encounter: Payer: Self-pay | Admitting: Hematology & Oncology

## 2019-06-15 ENCOUNTER — Inpatient Hospital Stay (HOSPITAL_BASED_OUTPATIENT_CLINIC_OR_DEPARTMENT_OTHER): Payer: Medicare HMO | Admitting: Hematology & Oncology

## 2019-06-15 ENCOUNTER — Inpatient Hospital Stay: Payer: Medicare HMO | Attending: Hematology & Oncology

## 2019-06-15 ENCOUNTER — Other Ambulatory Visit: Payer: Self-pay

## 2019-06-15 VITALS — BP 122/79 | HR 76 | Temp 97.7°F | Resp 20 | Wt 220.8 lb

## 2019-06-15 DIAGNOSIS — C9110 Chronic lymphocytic leukemia of B-cell type not having achieved remission: Secondary | ICD-10-CM | POA: Insufficient documentation

## 2019-06-15 LAB — CMP (CANCER CENTER ONLY)
ALT: 24 U/L (ref 0–44)
AST: 24 U/L (ref 15–41)
Albumin: 4.4 g/dL (ref 3.5–5.0)
Alkaline Phosphatase: 64 U/L (ref 38–126)
Anion gap: 8 (ref 5–15)
BUN: 17 mg/dL (ref 8–23)
CO2: 30 mmol/L (ref 22–32)
Calcium: 9.5 mg/dL (ref 8.9–10.3)
Chloride: 105 mmol/L (ref 98–111)
Creatinine: 1.01 mg/dL (ref 0.61–1.24)
GFR, Est AFR Am: >60 mL/min (ref >60)
GFR, Estimated: >60 mL/min (ref >60)
Glucose, Bld: 158 mg/dL — ABNORMAL HIGH (ref 70–99)
Potassium: 4.3 mmol/L (ref 3.5–5.1)
Sodium: 143 mmol/L (ref 135–145)
Total Bilirubin: 1.2 mg/dL (ref 0.3–1.2)
Total Protein: 6.7 g/dL (ref 6.5–8.1)

## 2019-06-15 LAB — CBC WITH DIFFERENTIAL (CANCER CENTER ONLY)
Abs Immature Granulocytes: 0.02 10*3/uL (ref 0.00–0.07)
Basophils Absolute: 0 10*3/uL (ref 0.0–0.1)
Basophils Relative: 0 %
Eosinophils Absolute: 0.1 10*3/uL (ref 0.0–0.5)
Eosinophils Relative: 1 %
HCT: 42.4 % (ref 39.0–52.0)
Hemoglobin: 14.2 g/dL (ref 13.0–17.0)
Immature Granulocytes: 0 %
Lymphocytes Relative: 51 %
Lymphs Abs: 6.3 10*3/uL — ABNORMAL HIGH (ref 0.7–4.0)
MCH: 30.8 pg (ref 26.0–34.0)
MCHC: 33.5 g/dL (ref 30.0–36.0)
MCV: 92 fL (ref 80.0–100.0)
Monocytes Absolute: 0.9 10*3/uL (ref 0.1–1.0)
Monocytes Relative: 7 %
Neutro Abs: 5 10*3/uL (ref 1.7–7.7)
Neutrophils Relative %: 41 %
Platelet Count: 203 10*3/uL (ref 150–400)
RBC: 4.61 MIL/uL (ref 4.22–5.81)
RDW: 12.9 % (ref 11.5–15.5)
WBC Count: 12.3 10*3/uL — ABNORMAL HIGH (ref 4.0–10.5)
nRBC: 0 % (ref 0.0–0.2)

## 2019-06-15 LAB — SAVE SMEAR(SSMR), FOR PROVIDER SLIDE REVIEW

## 2019-06-15 LAB — LACTATE DEHYDROGENASE: LDH: 142 U/L (ref 98–192)

## 2019-06-15 NOTE — Progress Notes (Signed)
Hematology and Oncology Follow Up Visit  Brandon Stone [id number] 1945/06/25 73 y.o. 06/15/2019   Principle Diagnosis:   Stage A CLL  Current Therapy:    Observation     Interim History:  [name] is back for follow-up.  When we last saw him, it was back in February.  He was just before the coronavirus started to take hold of the [place].  He has not done much this spring summer and fall.  He does do woodworking.  This really does keep him busy.  He apparently had a fall back in March.  He sustained a concussion.  He was in the hospital for couple days.  He has had no problems with swollen lymph nodes.  He has had no fever.  He has had no nausea or vomiting.  He has had no bleeding.  There is been no ecchymoses.  His appetite has been good.  His weight is holding steady.  Overall, his performance status is ECOG 0.   Medications:  Current Outpatient Medications:  .  acetaminophen (TYLENOL) 325 MG tablet, Take 325 mg by mouth every 6 (six) hours as needed for mild pain., Disp: , Rfl:  .  allopurinol (ZYLOPRIM) 300 MG tablet, Take 1 tablet (300 mg total) by mouth daily., Disp: 90 tablet, Rfl: 3 .  amLODipine (NORVASC) 5 MG tablet, Take 1 tablet (5 mg total) by mouth daily. (Patient taking differently: Take 5 mg by mouth daily. 06/15/2019 Also takes 2.24m  (1/2 tab) in the evening.), Disp: 90 tablet, Rfl: 3 .  atorvastatin (LIPITOR) 80 MG tablet, TAKE 1 TABLET EVERY DAY, Disp: 90 tablet, Rfl: 3 .  blood glucose meter kit and supplies, Dispense based on patient and insurance preference. Use up to four times daily as directed. (FOR ICD-10 E10.9, E11.9)., Disp: 1 each, Rfl: 0 .  ezetimibe (ZETIA) 10 MG tablet, Take 1 tablet (10 mg total) by mouth daily., Disp: 90 tablet, Rfl: 3 .  isosorbide mononitrate (IMDUR) 60 MG 24 hr tablet, TAKE 1 TABLET EVERY DAY, Disp: 90 tablet, Rfl: 3 .  Loperamide HCl (IMODIUM PO), Take 1 tablet by mouth daily as needed (loosse stools)., Disp: ,  Rfl:  .  metFORMIN (GLUCOPHAGE) 500 MG tablet, Take 1 tablet (500 mg total) by mouth 2 (two) times daily with a meal., Disp: 180 tablet, Rfl: 3 .  metoprolol succinate (TOPROL-XL) 25 MG 24 hr tablet, Take 1 tablet (25 mg total) by mouth 2 (two) times daily. Please call to schedule appointment before further refills [phone] Disp: 180 tablet, Rfl: 0 .  MULTIPLE VITAMIN-FOLIC ACID PO, Take 1 tablet by mouth daily. Contains 4476mg folic acid, Disp: , Rfl:  .  Omega-3 Fatty Acids (FISH OIL) 1200 MG CAPS, Take 1 capsule by mouth every evening. , Disp: , Rfl:  .  omeprazole (PRILOSEC) 20 MG capsule, TAKE 1 CAPSULE TWICE DAILY, Disp: 180 capsule, Rfl: 3 .  OVER THE COUNTER MEDICATION, 2 (two) times daily as needed. Magnilife DB-Pain relieving foot cream., Disp: , Rfl:  .  potassium chloride SA (KLOR-CON) 20 MEQ tablet, TAKE 1 TABLET EVERY EVENING, Disp: 90 tablet, Rfl: 0 .  triamcinolone cream (KENALOG) 0.1 %, Apply 1 application topically 2 (two) times daily. Use as needed for dry or itchy skin, Disp: 30 g, Rfl: 2 .  valsartan-hydrochlorothiazide (DIOVAN-HCT) 160-25 MG tablet, TAKE 1 TABLET EVERY DAY, Disp: 90 tablet, Rfl: 3 .  loratadine (CLARITIN) 10 MG tablet, Take 10 mg by mouth daily as needed for  allergies., Disp: , Rfl:  .  nitroGLYCERIN (NITROSTAT) 0.4 MG SL tablet, Place 1 tablet (0.4 mg total) under the tongue every 5 (five) minutes as needed for chest pain. (Patient not taking: Reported on 09/30/2018), Disp: 25 tablet, Rfl: 3  Allergies:  Allergies  Allergen Reactions  . Other Hives    Intolerance to strong pain medications  Rash to opsite and tegaderm tape.  . Ticlopidine Hcl Hives and Itching  . Black Pepper [[place]] Palpitations    HEART PALPITATIONS HEART PALPITATIONS  . Codeine Palpitations and Other (See Comments)    Wild dreams, palpitations "wild dreams"    Past Medical History, Surgical history, Social history, and Family History were reviewed and updated.  Review of  Systems: Review of Systems  Constitutional: Negative.   HENT:  Negative.   Eyes: Negative.   Respiratory: Negative.   Cardiovascular: Negative.   Gastrointestinal: Negative.   Endocrine: Negative.   Genitourinary: Negative.    Musculoskeletal: Negative.   Skin: Negative.   Neurological: Negative.   Hematological: Negative.   Psychiatric/Behavioral: Negative.     Physical Exam:  weight is 220 lb 12.8 oz (100.2 kg). His temporal temperature is 97.7 F (36.5 C). His blood pressure is 122/79 and his pulse is 76. His respiration is 20 and oxygen saturation is 95%.   Wt Readings from Last 3 Encounters:  06/15/19 220 lb 12.8 oz (100.2 kg)  09/08/18 220 lb (99.8 kg)  08/16/18 225 lb 4 oz (102.2 kg)    Physical Exam Vitals reviewed.  HENT:     Head: Normocephalic and atraumatic.  Eyes:     Pupils: Pupils are equal, round, and reactive to light.  Cardiovascular:     Rate and Rhythm: Normal rate and regular rhythm.     Heart sounds: Normal heart sounds.  Pulmonary:     Effort: Pulmonary effort is normal.     Breath sounds: Normal breath sounds.  Abdominal:     General: Bowel sounds are normal.     Palpations: Abdomen is soft.  Musculoskeletal:        General: No tenderness or deformity. Normal range of motion.     Cervical back: Normal range of motion.  Lymphadenopathy:     Cervical: No cervical adenopathy.  Skin:    General: Skin is warm and dry.     Findings: No erythema or rash.  Neurological:     Mental Status: He is alert and oriented to person, place, and time.  Psychiatric:        Behavior: Behavior normal.        Thought Content: Thought content normal.        Judgment: Judgment normal.      Lab Results  Component Value Date   WBC 12.3 (H) 06/15/2019   HGB 14.2 06/15/2019   HCT 42.4 06/15/2019   MCV 92.0 06/15/2019   PLT 203 06/15/2019     Chemistry      Component Value Date/Time   NA 143 06/15/2019 1023   NA 143 06/11/2017 1044   NA 140  04/27/2014 0815   K 4.3 06/15/2019 1023   K 3.2 (L) 06/11/2017 1044   K 3.7 04/27/2014 0815   CL 105 06/15/2019 1023   CL 101 06/11/2017 1044   CO2 30 06/15/2019 1023   CO2 28 06/11/2017 1044   CO2 26 04/27/2014 0815   BUN 17 06/15/2019 1023   BUN 17 06/11/2017 1044   BUN 18.5 04/27/2014 0815   CREATININE 1.01 06/15/2019 1023  CREATININE 1.1 06/11/2017 1044   CREATININE 1.2 04/27/2014 0815      Component Value Date/Time   CALCIUM 9.5 06/15/2019 1023   CALCIUM 9.7 06/11/2017 1044   CALCIUM 9.8 04/27/2014 0815   ALKPHOS 64 06/15/2019 1023   ALKPHOS 83 06/11/2017 1044   ALKPHOS 71 04/27/2014 0815   AST 24 06/15/2019 1023   AST 18 04/27/2014 0815   ALT 24 06/15/2019 1023   ALT 32 06/11/2017 1044   ALT 25 04/27/2014 0815   BILITOT 1.2 06/15/2019 1023   BILITOT 0.67 04/27/2014 0815       Impression and Plan: Mr. Higham is a 73 year old white male.  He has stage A CLL.  He has minimal lymphocytosis.  Everything really is holding steady for him.  This is a blessing.  For right now, I think we can get him back in 1 year.  I feel confident that his CLL is not going to progress significantly, or at all, in a years time.   Volanda Napoleon, MD 12/23/202012:00 PM

## 2019-06-21 ENCOUNTER — Other Ambulatory Visit: Payer: Self-pay

## 2019-06-21 ENCOUNTER — Encounter: Payer: Self-pay | Admitting: Medical

## 2019-06-21 ENCOUNTER — Ambulatory Visit (INDEPENDENT_AMBULATORY_CARE_PROVIDER_SITE_OTHER): Payer: Medicare HMO | Admitting: Medical

## 2019-06-21 VITALS — BP 167/89 | HR 93 | Temp 100.9°F | Ht 71.0 in | Wt 206.0 lb

## 2019-06-21 DIAGNOSIS — R05 Cough: Secondary | ICD-10-CM | POA: Diagnosis not present

## 2019-06-21 DIAGNOSIS — Z20828 Contact with and (suspected) exposure to other viral communicable diseases: Secondary | ICD-10-CM

## 2019-06-21 DIAGNOSIS — R062 Wheezing: Secondary | ICD-10-CM | POA: Diagnosis not present

## 2019-06-21 DIAGNOSIS — R059 Cough, unspecified: Secondary | ICD-10-CM

## 2019-06-21 DIAGNOSIS — Z20822 Contact with and (suspected) exposure to covid-19: Secondary | ICD-10-CM

## 2019-06-21 MED ORDER — ALBUTEROL SULFATE HFA 108 (90 BASE) MCG/ACT IN AERS: 2.0000 | INHALATION_SPRAY | Freq: Four times a day (QID) | RESPIRATORY_TRACT | 0 refills | Status: DC | PRN

## 2019-06-21 MED ORDER — BENZONATATE 100 MG PO CAPS: 100.0000 mg | ORAL_CAPSULE | Freq: Three times a day (TID) | ORAL | 0 refills | Status: DC | PRN

## 2019-06-21 MED ORDER — AZITHROMYCIN 250 MG PO TABS: ORAL_TABLET | ORAL | 0 refills | Status: DC

## 2019-06-21 NOTE — Progress Notes (Signed)
   Subjective:    Patient ID: Brandon Stone, male    DOB: January 09, 1946, 73 y.o.   MRN: [id number]  HPI  Virtual Visit via Video Note  I connected with Brandon Stone on 06/21/19 at  3:40 PM EST by a video enabled telemedicine application and verified that I am speaking with the correct person using two identifiers.  Location: Patient: home Provider: office   I discussed the limitations of evaluation and management by telemedicine and the availability of in person appointments. The patient expressed understanding and agreed to proceed.  History of Present Illness:   Pt has been sick for about 8 days.  Pt states he had exposure to someone with covid on the 20th.   Pt symptoms of cough 22 nd, 23rd and 24th. Then later he got diffuse achiness, chills, nausea and some minimal sob. He states 25 th feels more viral.  When he walks he will wheeze a little bit.  When he coughs will bring up some mucus.   Observations/Objective:(half of exam done by video) General-no acute distress, pleasant, oriented. Lungs- on inspection lungs appear unlabored. Neck- no tracheal deviation or jvd on inspection. Neuro- gross motor function appears intact.   Assessment and Plan: You do have recent covid exposure and some symptoms that may represent covid infection.  Please call the number I gave you and get scheduled for covid test tomorrow.  During the interim will treat you with azithromycin antibiotic for bronchitis in light of your productive cough. Will rx benzonatate for cough and  albuterol for any wheezing or shortness of breath.  Explained benefit of getting 02 sat monitor. Explained how to use o2 sat as well as numbers and signs/symptoms that would necessitate ED evaluation.  Follow up in 7 days or as needed  Mackie Pai, PA-C  Follow Up Instructions:    I discussed the assessment and treatment plan with the patient. The patient was provided an opportunity to ask questions and all were  answered. The patient agreed with the plan and demonstrated an understanding of the instructions.   The patient was advised to call back or seek an in-person evaluation if the symptoms worsen or if the condition fails to improve as anticipated.  I provided 25  minutes of non-face-to-face time during this encounter.   Mackie Pai, PA-C   Review of Systems  Constitutional: Positive for chills. Negative for diaphoresis, fatigue and fever.  HENT: Negative for sinus pressure and sinus pain.   Respiratory: Positive for cough and wheezing.   Cardiovascular: Negative for chest pain and palpitations.  Gastrointestinal: Positive for nausea. Negative for abdominal pain, diarrhea and vomiting.       Objective:   Physical Exam        Assessment & Plan:

## 2019-06-21 NOTE — Patient Instructions (Signed)
You do have recent covid exposure and some symptoms that may represent covid infection.  Please call the number I gave you and get scheduled for covid test tomorrow.  During the interim will treat you with azithromycin antibiotic for bronchitis in light of your productive cough. Will rx benzonatate for cough and  albuterol for any wheezing or shortness of breath.  Explained benefit of getting 02 sat monitor. Explained how to use o2 sat as well as numbers and signs/symptoms that would necessitate ED evaluation.  Follow up in 7 days or as needed

## 2019-06-22 ENCOUNTER — Encounter: Payer: Self-pay | Admitting: Medical

## 2019-06-22 DIAGNOSIS — R509 Fever, unspecified: Secondary | ICD-10-CM | POA: Diagnosis not present

## 2019-06-22 DIAGNOSIS — R05 Cough: Secondary | ICD-10-CM | POA: Diagnosis not present

## 2019-06-22 DIAGNOSIS — Z20828 Contact with and (suspected) exposure to other viral communicable diseases: Secondary | ICD-10-CM | POA: Diagnosis not present

## 2019-06-22 DIAGNOSIS — R6883 Chills (without fever): Secondary | ICD-10-CM | POA: Diagnosis not present

## 2019-06-30 ENCOUNTER — Other Ambulatory Visit: Payer: Self-pay | Admitting: Medical

## 2019-07-01 ENCOUNTER — Other Ambulatory Visit: Payer: Self-pay

## 2019-07-01 ENCOUNTER — Ambulatory Visit (INDEPENDENT_AMBULATORY_CARE_PROVIDER_SITE_OTHER): Payer: Medicare HMO | Admitting: Medical

## 2019-07-01 ENCOUNTER — Emergency Department (HOSPITAL_BASED_OUTPATIENT_CLINIC_OR_DEPARTMENT_OTHER): Payer: Medicare HMO

## 2019-07-01 ENCOUNTER — Ambulatory Visit: Payer: Self-pay | Admitting: *Deleted

## 2019-07-01 ENCOUNTER — Encounter (HOSPITAL_BASED_OUTPATIENT_CLINIC_OR_DEPARTMENT_OTHER): Payer: Self-pay

## 2019-07-01 ENCOUNTER — Encounter: Payer: Self-pay | Admitting: Medical

## 2019-07-01 ENCOUNTER — Emergency Department (HOSPITAL_BASED_OUTPATIENT_CLINIC_OR_DEPARTMENT_OTHER)
Admission: EM | Admit: 2019-07-01 | Discharge: 2019-07-01 | Disposition: A | Discharge status: Home / Self Care | Payer: Medicare HMO | Attending: Emergency Medicine | Admitting: Emergency Medicine

## 2019-07-01 VITALS — BP 123/68 | HR 78 | Ht 71.0 in | Wt 201.0 lb

## 2019-07-01 DIAGNOSIS — R5383 Other fatigue: Secondary | ICD-10-CM

## 2019-07-01 DIAGNOSIS — E876 Hypokalemia: Secondary | ICD-10-CM | POA: Diagnosis not present

## 2019-07-01 DIAGNOSIS — U071 COVID-19: Secondary | ICD-10-CM

## 2019-07-01 DIAGNOSIS — R0602 Shortness of breath: Secondary | ICD-10-CM | POA: Diagnosis not present

## 2019-07-01 DIAGNOSIS — R0600 Dyspnea, unspecified: Secondary | ICD-10-CM | POA: Diagnosis not present

## 2019-07-01 DIAGNOSIS — Z79899 Other long term (current) drug therapy: Secondary | ICD-10-CM | POA: Insufficient documentation

## 2019-07-01 DIAGNOSIS — Z87891 Personal history of nicotine dependence: Secondary | ICD-10-CM | POA: Diagnosis not present

## 2019-07-01 DIAGNOSIS — Z8673 Personal history of transient ischemic attack (TIA), and cerebral infarction without residual deficits: Secondary | ICD-10-CM | POA: Insufficient documentation

## 2019-07-01 DIAGNOSIS — Z951 Presence of aortocoronary bypass graft: Secondary | ICD-10-CM | POA: Insufficient documentation

## 2019-07-01 DIAGNOSIS — E871 Hypo-osmolality and hyponatremia: Secondary | ICD-10-CM | POA: Diagnosis not present

## 2019-07-01 DIAGNOSIS — E119 Type 2 diabetes mellitus without complications: Secondary | ICD-10-CM | POA: Diagnosis not present

## 2019-07-01 DIAGNOSIS — R531 Weakness: Secondary | ICD-10-CM | POA: Diagnosis present

## 2019-07-01 DIAGNOSIS — Z7984 Long term (current) use of oral hypoglycemic drugs: Secondary | ICD-10-CM | POA: Insufficient documentation

## 2019-07-01 DIAGNOSIS — I10 Essential (primary) hypertension: Secondary | ICD-10-CM | POA: Insufficient documentation

## 2019-07-01 DIAGNOSIS — E860 Dehydration: Secondary | ICD-10-CM | POA: Diagnosis not present

## 2019-07-01 DIAGNOSIS — I2510 Atherosclerotic heart disease of native coronary artery without angina pectoris: Secondary | ICD-10-CM | POA: Insufficient documentation

## 2019-07-01 LAB — COMPREHENSIVE METABOLIC PANEL
ALT: 43 U/L (ref 0–44)
AST: 67 U/L — ABNORMAL HIGH (ref 15–41)
Albumin: 3.4 g/dL — ABNORMAL LOW (ref 3.5–5.0)
Alkaline Phosphatase: 58 U/L (ref 38–126)
Anion gap: 13 (ref 5–15)
BUN: 34 mg/dL — ABNORMAL HIGH (ref 8–23)
CO2: 26 mmol/L (ref 22–32)
Calcium: 8.5 mg/dL — ABNORMAL LOW (ref 8.9–10.3)
Chloride: 90 mmol/L — ABNORMAL LOW (ref 98–111)
Creatinine, Ser: 1.32 mg/dL — ABNORMAL HIGH (ref 0.61–1.24)
GFR calc Af Amer: >60 mL/min (ref >60)
GFR calc non Af Amer: 54 mL/min — ABNORMAL LOW (ref >60)
Glucose, Bld: 134 mg/dL — ABNORMAL HIGH (ref 70–99)
Potassium: 2.7 mmol/L — CL (ref 3.5–5.1)
Sodium: 129 mmol/L — ABNORMAL LOW (ref 135–145)
Total Bilirubin: 1.1 mg/dL (ref 0.3–1.2)
Total Protein: 7 g/dL (ref 6.5–8.1)

## 2019-07-01 LAB — CBC WITH DIFFERENTIAL/PLATELET
Abs Immature Granulocytes: 0.06 10*3/uL (ref 0.00–0.07)
Basophils Absolute: 0 10*3/uL (ref 0.0–0.1)
Basophils Relative: 0 %
Eosinophils Absolute: 0.1 10*3/uL (ref 0.0–0.5)
Eosinophils Relative: 1 %
HCT: 40.7 % (ref 39.0–52.0)
Hemoglobin: 14 g/dL (ref 13.0–17.0)
Immature Granulocytes: 1 %
Lymphocytes Relative: 35 %
Lymphs Abs: 3.6 10*3/uL (ref 0.7–4.0)
MCH: 30.6 pg (ref 26.0–34.0)
MCHC: 34.4 g/dL (ref 30.0–36.0)
MCV: 88.9 fL (ref 80.0–100.0)
Monocytes Absolute: 0.8 10*3/uL (ref 0.1–1.0)
Monocytes Relative: 7 %
Neutro Abs: 5.6 10*3/uL (ref 1.7–7.7)
Neutrophils Relative %: 56 %
Platelets: 331 10*3/uL (ref 150–400)
RBC: 4.58 MIL/uL (ref 4.22–5.81)
RDW: 12.6 % (ref 11.5–15.5)
WBC: 10.1 10*3/uL (ref 4.0–10.5)
nRBC: 0 % (ref 0.0–0.2)

## 2019-07-01 LAB — MAGNESIUM: Magnesium: 2.1 mg/dL (ref 1.7–2.4)

## 2019-07-01 LAB — LACTIC ACID, PLASMA
Lactic Acid, Venous: 2.1 mmol/L (ref 0.5–1.9)
Lactic Acid, Venous: 1.5 mmol/L (ref 0.5–1.9)

## 2019-07-01 MED ORDER — POTASSIUM CHLORIDE 10 MEQ/100ML IV SOLN
10.0000 meq | INTRAVENOUS | Status: AC
  Administered 2019-07-01 (×2): 10 meq via INTRAVENOUS
  Filled 2019-07-01 (×2): qty 100

## 2019-07-01 MED ORDER — LACTATED RINGERS IV BOLUS
1000.0000 mL | Freq: Once | INTRAVENOUS | Status: AC
  Administered 2019-07-01: 18:00:00 1000 mL via INTRAVENOUS

## 2019-07-01 MED ORDER — POTASSIUM CHLORIDE CRYS ER 20 MEQ PO TBCR
40.0000 meq | EXTENDED_RELEASE_TABLET | Freq: Once | ORAL | Status: AC
  Administered 2019-07-01: 40 meq via ORAL
  Filled 2019-07-01: qty 2

## 2019-07-01 MED ORDER — ACETAMINOPHEN 500 MG PO TABS
1000.0000 mg | ORAL_TABLET | Freq: Once | ORAL | Status: AC
  Administered 2019-07-01: 1000 mg via ORAL
  Filled 2019-07-01: qty 2

## 2019-07-01 NOTE — ED Provider Notes (Signed)
[facility] EMERGENCY DEPARTMENT Provider Note   CSN: [id number] Arrival date & time: 07/01/19  1555     History Chief Complaint  Patient presents with  . COVID +    Brandon Stone is a 74 y.o. male.  74yo M w/ PMH including CAD s/p CABG x 2, T2DM, HTN, HLD, TIA who p/w COVID-19.  Patient began feeling sick on 12/31 and tested positive for COVID-19.  He reports symptoms of fatigue, loss of taste/smell, cough, diarrhea, and shortness of breath. He has had decreased appetite but has been drinking water well. He denies chest pain or fevers.  He spoke with his primary care provider today because he felt much worse this morning, very weak and fatigued.  His PCP recommended that he come to the ED for evaluation.  He has had some worsening shortness of breath but denies severe SOB currently.   The history is provided by the patient.       Past Medical History:  Diagnosis Date  . Anginal pain ([place])   . Arthritis   . Barrett's esophagus   . BPH (benign prostatic hypertrophy)   . CAD (coronary artery disease)     a. CABG 1991;  b.  [place];  c.  S/P PTCA  stents to SVG ot OM last in 2003;  d.  Redo CABG x 2 in 2004 (VG->OM->LCX, LRA->PDA);  e. 12/2011 Cath: 3vd, sev LM into LCX dzs, VG->OM 100, LIMA->DIAG->LAD patent, Rad Art->PDA patent, EF 45%, Med Rx.  . Diverticulosis   . DM (diabetes mellitus) ([place])   . Dyslipidemia   . Dyspnea   . Gallstones   . GERD (gastroesophageal reflux disease)   . Gout   . History of hiatal hernia   . HTN (hypertension)   . Hyperlipidemia   . Leukemia in remission ([place])   . Psoriasis   . Rheumatic fever    as a child  . S/P coronary artery stent placement 1998  . TIA (transient ischemic attack)    FOUND ON MRI  PER PATIENT-     NO PROBLEM    Patient Active Problem List   Diagnosis Date Noted  . Subdural hematoma ([place]) 09/08/2018  . Diabetes mellitus without complication ([place]) 56/38/7564  . Symptomatic cholelithiasis 09/18/2016  .  Chronic lymphocytic leukemia (CLL), B-cell ([place]) 05/13/2014  . Stable angina ([place]) 01/26/2012  . Sore throat 11/03/2010  . Hyperlipidemia 06/21/2008  . HYPERTENSION, BENIGN 06/21/2008  . CAD, NATIVE VESSEL 06/21/2008    Past Surgical History:  Procedure Laterality Date  . APPENDECTOMY    . CHOLECYSTECTOMY N/A 09/18/2016   Procedure: LAPAROSCOPIC CHOLECYSTECTOMY WITH INTRAOPERATIVE CHOLANGIOGRAM;  Surgeon: Jackolyn Confer, MD;  Location: [facility];  Service: General;  Laterality: N/A;  . COLONOSCOPY  07/2013  . CORONARY ANGIOPLASTY     STENTS  . CORONARY ARTERY BYPASS GRAFT     1991 (LIMA to LAD/Diag;  SVG to RCA;  SVG to OM)  . CORONARY ARTERY BYPASS GRAFT     2004 (L radial to PDA; SVG to OM1/ distal LCx)  . TONSILLECTOMY AND ADENOIDECTOMY    . UMBILICAL HERNIA REPAIR         Family History  Problem Relation Age of Onset  . Heart disease Mother   . Hypertension Mother   . Hyperlipidemia Mother   . Diabetes Mother   . Skin cancer Mother        skin  . Heart disease Father   . Hypertension Father   .  Hyperlipidemia Father   . Diabetes Father   . Stroke Sister   . Stroke Brother   . Colon cancer Neg Hx     Social History   Tobacco Use  . Smoking status: Former Smoker    Years: 8.00    Quit date: 06/24/1971    Years since quitting: 48.0  . Smokeless tobacco: Never Used  Substance Use Topics  . Alcohol use: No  . Drug use: No    Home Medications Prior to Admission medications   Medication Sig Start Date End Date Taking? Authorizing Provider  acetaminophen (TYLENOL) 325 MG tablet Take 325 mg by mouth every 6 (six) hours as needed for mild pain.    [provider]  albuterol (VENTOLIN HFA) 108 (90 Base) MCG/ACT inhaler Inhale 2 puffs into the lungs every 6 (six) hours as needed. 06/21/19   Saguier, Percell Miller, PA-C  allopurinol (ZYLOPRIM) 300 MG tablet Take 1 tablet (300 mg total) by mouth daily. 05/30/19   Copland, Gay Filler, MD  amLODipine (NORVASC) 5 MG tablet  Take 1 tablet (5 mg total) by mouth daily. Patient taking differently: Take 5 mg by mouth daily. 06/15/2019 Also takes 2.'5mg'$   (1/2 tab) in the evening. 11/05/18   Fay Records, MD  atorvastatin (LIPITOR) 80 MG tablet TAKE 1 TABLET EVERY DAY 09/14/18   Fay Records, MD  azithromycin (ZITHROMAX) 250 MG tablet Take 2 tablets by mouth on day 1, followed by 1 tablet by mouth daily for 4 days. 06/21/19   Saguier, Percell Miller, PA-C  benzonatate (TESSALON) 100 MG capsule TAKE 1 CAPSULE BY MOUTH THREE TIMES A DAY AS NEEDED FOR COUGH 06/30/19   Saguier, Percell Miller, PA-C  blood glucose meter kit and supplies Dispense based on patient and insurance preference. Use up to four times daily as directed. (FOR ICD-10 E10.9, E11.9). 05/13/18   Copland, Gay Filler, MD  ezetimibe (ZETIA) 10 MG tablet Take 1 tablet (10 mg total) by mouth daily. 08/02/18   Fay Records, MD  isosorbide mononitrate (IMDUR) 60 MG 24 hr tablet TAKE 1 TABLET EVERY DAY 08/10/18   Fay Records, MD  Loperamide HCl (IMODIUM PO) Take 1 tablet by mouth daily as needed (loosse stools).    [provider]  loratadine (CLARITIN) 10 MG tablet Take 10 mg by mouth daily as needed for allergies.    [provider]  metFORMIN (GLUCOPHAGE) 500 MG tablet Take 1 tablet (500 mg total) by mouth 2 (two) times daily with a meal. 05/30/19   Copland, Gay Filler, MD  metoprolol succinate (TOPROL-XL) 25 MG 24 hr tablet Take 1 tablet (25 mg total) by mouth 2 (two) times daily. Please call to schedule appointment before further refills [phone] 06/09/19   Fay Records, MD  MULTIPLE VITAMIN-FOLIC ACID PO Take 1 tablet by mouth daily. Contains 329 mg folic acid    [provider]  nitroGLYCERIN (NITROSTAT) 0.4 MG SL tablet Place 1 tablet (0.4 mg total) under the tongue every 5 (five) minutes as needed for chest pain. 09/01/17   Fay Records, MD  Omega-3 Fatty Acids (FISH OIL) 1200 MG CAPS Take 1 capsule by mouth every evening.     [provider]   omeprazole (PRILOSEC) 20 MG capsule TAKE 1 CAPSULE TWICE DAILY 09/30/18   Copland, Gay Filler, MD  OVER THE COUNTER MEDICATION 2 (two) times daily as needed. Magnilife DB-Pain relieving foot cream.    [provider]  potassium chloride SA (KLOR-CON) 20 MEQ tablet TAKE 1  TABLET EVERY EVENING 03/29/19   Copland, Gay Filler, MD  triamcinolone cream (KENALOG) 0.1 % Apply 1 application topically 2 (two) times daily. Use as needed for dry or itchy skin 02/05/17   Copland, Gay Filler, MD  valsartan-hydrochlorothiazide (DIOVAN-HCT) 160-25 MG tablet TAKE 1 TABLET EVERY DAY 08/10/18   Fay Records, MD    Allergies    Other, Ticlopidine hcl, Black pepper [piper], and Codeine  Review of Systems   Review of Systems All other systems reviewed and are negative except that which was mentioned in HPI  Physical Exam Updated Vital Signs BP 118/77   Pulse 64   Temp 97.6 F (36.4 C)   Resp 14   Ht '5\' 11"'$  (1.803 m)   Wt 91.2 kg   SpO2 95%   BMI 28.03 kg/m   Physical Exam Vitals and nursing note reviewed.  Constitutional:      General: He is not in acute distress.    Appearance: He is well-developed.  HENT:     Head: Normocephalic and atraumatic.  Eyes:     Conjunctiva/sclera: Conjunctivae normal.  Cardiovascular:     Rate and Rhythm: Normal rate and regular rhythm.     Heart sounds: Normal heart sounds. No murmur.  Pulmonary:     Comments: Mildly dyspneic but able to speak in full sentences, no crackles or wheezing Abdominal:     General: Bowel sounds are normal. There is no distension.     Palpations: Abdomen is soft.     Tenderness: There is no abdominal tenderness.  Musculoskeletal:     Cervical back: Neck supple.  Skin:    General: Skin is warm and dry.  Neurological:     Mental Status: He is alert and oriented to person, place, and time.     Comments: Fluent speech  Psychiatric:        Judgment: Judgment normal.     ED Results / Procedures / Treatments   Labs (all labs  ordered are listed, but only abnormal results are displayed) Labs Reviewed  COMPREHENSIVE METABOLIC PANEL - Abnormal; Notable for the following components:      Result Value   Sodium 129 (*)    Potassium 2.7 (*)    Chloride 90 (*)    Glucose, Bld 134 (*)    BUN 34 (*)    Creatinine, Ser 1.32 (*)    Calcium 8.5 (*)    Albumin 3.4 (*)    AST 67 (*)    GFR calc non Af Amer 54 (*)    All other components within normal limits  LACTIC ACID, PLASMA - Abnormal; Notable for the following components:   Lactic Acid, Venous 2.1 (*)    All other components within normal limits  CBC WITH DIFFERENTIAL/PLATELET  LACTIC ACID, PLASMA  MAGNESIUM    EKG EKG Interpretation  Date/Time:  Friday July 01 2019 17:24:44 EST Ventricular Rate:  80 PR Interval:    QRS Duration: 140 QT Interval:  490 QTC Calculation: 566 R Axis:   52 Text Interpretation: Sinus rhythm Nonspecific intraventricular conduction delay Inferior infarct, age indeterminate QT prolongation new from previous Confirmed by Theotis Burrow [id number]) on 07/01/2019 5:39:44 PM   Radiology DG Chest Portable 1 View  Result Date: 07/01/2019 CLINICAL DATA:  Labored breathing. EXAM: PORTABLE CHEST 1 VIEW COMPARISON:  September 08, 2018 FINDINGS: Multiple sternal wires and vascular clips are seen. Very mild, hazy atelectasis and/or infiltrate is seen along the periphery of the mid to lower right lung. There is  no evidence of a pleural effusion or pneumothorax. The heart size and mediastinal contours are within normal limits. The visualized skeletal structures are unremarkable. IMPRESSION: 1. Evidence of prior median sternotomy. 2. Very mild, hazy lateral right basilar atelectasis and/or infiltrate. Electronically Signed   By: Virgina Norfolk M.D.   On: 07/01/2019 16:59    Procedures Procedures (including critical care time)  Medications Ordered in ED Medications  acetaminophen (TYLENOL) tablet 1,000 mg (1,000 mg Oral Given 07/01/19 1726)   lactated ringers bolus 1,000 mL (0 mLs Intravenous Stopped 07/01/19 2021)  potassium chloride 10 mEq in 100 mL IVPB (0 mEq Intravenous Stopped 07/01/19 2021)  potassium chloride SA (KLOR-CON) CR tablet 40 mEq (40 mEq Oral Given 07/01/19 1744)    ED Course  I have reviewed the triage vital signs and the nursing notes.  Pertinent labs & imaging results that were available during my care of the patient were reviewed by me and considered in my medical decision making (see chart for details).    MDM Rules/Calculators/A&P                      Patient had stable vital signs on arrival to triage, was noted to become dyspneic while walking back w/ sats down to 92% on RA. T 100.5. Labs show Na 129, K 2.7, BUN 34, Cr 1.3, AST 67, lactate 2.1, normal CBC. CXR w/ ? Haziness at RLL. Gave IVF bolus, oral and IV potassium, tylenol.   On reassessment, the patient remains stable in the mid 90s on room air.  He was ambulated in his room and had no significant drop in oxygenation, no severe tachypnea.  Given his overall reassuring chest x-ray and vital signs here, I think it is appropriate to try to keep him out of the hospital.  I have contacted the [facility] antibody  infusion clinic and instructed patient to also follow closely with his PCP in the next few days.  Have extensively counseled the patient for 10 minutes on COVID home care instructions including staying hydrated and adding potassium to his diet.  I have extensively reviewed return precautions with him including any worsening respiratory symptoms.  He voiced understanding and feels comfortable with outpatient plan.   Ramelo Oetken was evaluated in Emergency Department on 07/02/2019 for the symptoms described in the history of present illness. He was evaluated in the context of the global COVID-19 pandemic, which necessitated consideration that the patient might be at risk for infection with the SARS-CoV-2 virus that causes COVID-19. Institutional protocols  and algorithms that pertain to the evaluation of patients at risk for COVID-19 are in a state of rapid change based on information released by regulatory bodies including the CDC and federal and state organizations. These policies and algorithms were followed during the patient's care in the ED.  Final Clinical Impression(s) / ED Diagnoses Final diagnoses:  COVID-19 virus infection  Dehydration  Hypokalemia  Hyponatremia    Rx / DC Orders ED Discharge Orders    None       Adalyne Lovick, Wenda Overland, MD 07/02/19 0002

## 2019-07-01 NOTE — ED Notes (Signed)
Ambulated from triage to room 8 HR 80-92, RR 26-28 labored with accessory muscle use, SpO2 90-92.  Placed on 2l/m [place] SpO2 increased to 95% then dropped to 91% with exertion.  On 1 l/m [place]

## 2019-07-01 NOTE — Telephone Encounter (Signed)
Patient called to speak with a nurse regarding is BP which has been dropping, he is short of breath and doesn't feel well. Tried NT 2x but line rang busy and then call was dropped. Call patient back at [phone], MRN [id number], Brandon Stone    Returned call to patient regarding his b/p being 99/66.  He denies dizziness or weakness. He stated that he has been drinking plenty of fluids. He is on b/p medications and is taking his medicationas as prescribed. He takes a half dose of his amlodipine in the evening. His next B/P is 119/66 and HR is 81. Per protocol he should be seen within 24 hours. He is advised to call back and go to the ED if he has dizziness, weakness or just feels sick. And b/p under 90. He voiced understanding. Notified LB at [facility] for an appointment (virtiual) and call conference in with pt. Routing to the practice for review.  Reason for Disposition . [id number] Systolic BP [id number] AND [id number] taking blood pressure medications AND [3] NOT dizzy, lightheaded or weak  Answer Assessment - Initial Assessment Questions 1. BLOOD PRESSURE: "What is the blood pressure?" "Did you take at least two measurements 5 minutes apart?"     99/66 hr 80 and 119/66 hr 81 2. ONSET: "When did you take your blood pressure?"     now 3. HOW: "How did you obtain the blood pressure?" (e.g., visiting nurse, automatic home BP monitor)     Automatic home BP monitor 4. HISTORY: "Do you have a history of low blood pressure?" "What is your blood pressure normally?"     no 5. MEDICATIONS: "Are you taking any medications for blood pressure?" If yes: "Have they been changed recently?"     Yes 6. PULSE RATE: "Do you know what your pulse rate is?"      80-81 7. OTHER SYMPTOMS: "Have you been sick recently?" "Have you had a recent injury?"     Tested positive for covid about a week and half ago 8. PREGNANCY: "Is there any chance you are pregnant?" "When was your last menstrual period?"     n/a  Protocols used:  LOW BLOOD PRESSURE-A-AH

## 2019-07-01 NOTE — Patient Instructions (Signed)
Recent covid infection and at this point concerned for severe deyhdration since 13 lb weight loss since onset of illness. 02 sats stable today. He may just need hydration, labs and cxr.  I don't think based on our discussion admission needed but need to proceed with caution.  Talked with MD Dr. Rex Kras at [facility] point and explained clinical presentation.   Called pt back and explained appears not too long of wait and that if he does not need admission then [facility] evaluation.  Pt agreed he would go now to be evaluated.   Follow up to date to be determined after reviewing ED notes.

## 2019-07-01 NOTE — ED Triage Notes (Addendum)
Pt tested positive for COVID on 06/21/20. Pt reports symptoms are getting worse. Pt was referred to ED via PCP for IVF and evaluation. Pt noted to be labored at rest, but is able to speak in complete sentences while at rest.

## 2019-07-01 NOTE — ED Notes (Signed)
Pt ambulated in room on pulse ox for 1 minute, O2 @ Room Air starting at 95% and went as low as 92%. Pt in no acute distress during ambulation. Pt stated he did not feel short of breath during ambulation

## 2019-07-01 NOTE — Progress Notes (Signed)
   Subjective:    Patient ID: Brandon Stone, male    DOB: 1945/10/01, 74 y.o.   MRN: [id number]  HPI Virtual Visit via Telephone Note  I connected with Brandon Stone on 07/01/19 at  2:20 PM EST by telephone and verified that I am speaking with the correct person using two identifiers.  Location: Patient: home Provider: home   I discussed the limitations, risks, security and privacy concerns of performing an evaluation and management service by telephone and the availability of in person appointments. I also discussed with the patient that there may be a patient responsible charge related to this service. The patient expressed understanding and agreed to proceed.   History of Present Illness:  Pt has recent covid + test as explained on last visit.   Pt bp is 123//68 and pulse is 78. Pulse 02 sat 95%.(ranged between 94% and 96%). Stayed at 94% after device on finger for a while.  Pt has lost 13 lbs. Pt states loose stools twice a day. His appetite is down. He is not vomiting. He states he drinks only water. Half a glass of water intermittently.   Pt is states he is still coughing. But not severe. Pt states cough tabs that I gave him helped a lot.   Pt vitals yesterday 12:26 pm were 141/82. Pulse was 88 and o2 sat was 95%.  The time before that vitals in morning was 94/58, 88, o2 sat 91%. 9:36.(pt checked machine and 180 was not correct) this set was before the above.  Pt stopped taking his amlodipone recently.   Pt not having any fevers. Pt not hearing himself wheeze.   Pt states he feels really fatigued.   Early on I gave azithromycin.  Yesterday his vitals were     Observations/Objective: General - no acute distress, pleasant, alert and oriented. Normal speech.  Assessment and Plan: Recent covid infection and at this point concerned for severe deyhdration since 13 lb weight loss since onset of illness. 02 sats stable today. He may just need hydration, labs and cxr.  I  don't think based on our discussion admission needed but need to proceed with caution.  Talked with MD Dr. Rex Kras at [facility] point and explained clinical presentation.   Called pt back and explained appears not too long of wait and that if he does not need admission then [facility] evaluation.  Pt agreed he would go now to be evaluated.   Follow up to date to be determined after reviewing ED notes.  40 minutes spent with pt. 50% of time spent couseling pt on plan going forward. In addition called ED to notify MD of patients recent conditions and explain need of evaluation and treatment.  Follow Up Instructions:    I discussed the assessment and treatment plan with the patient. The patient was provided an opportunity to ask questions and all were answered. The patient agreed with the plan and demonstrated an understanding of the instructions.   The patient was advised to call back or seek an in-person evaluation if the symptoms worsen or if the condition fails to improve as anticipated.  I provided 40  minutes of non-face-to-face time during this encounter.   Mackie Pai, PA-C    Review of Systems     Objective:   Physical Exam        Assessment & Plan:

## 2019-07-02 ENCOUNTER — Telehealth: Payer: Self-pay | Admitting: Medical

## 2019-07-02 ENCOUNTER — Telehealth: Payer: Self-pay | Admitting: Nurse Practitioner

## 2019-07-02 NOTE — Telephone Encounter (Signed)
  I connected by phone with Brandon Stone on 07/02/2019 at 9:47 AM to discuss the potential use of an new treatment for mild to moderate COVID-19 viral infection in non-hospitalized patients.  His symptoms started on 06/17/2019, which places him at 15 days since symptoms onset.  Had at length discussion with him about benefit of MAB being found to have most potential in first 5-7 days and at 11 days plus benefit is diminished and studies have shown can make symptoms worse.  At this time he does not qualify due to symptoms > 10 days.  He reported understanding.  Does endorse feeling better today, with less diarrhea and is keeping fluids down.  Have advised him to stick to BLAND diet at home and continue to increase hydration.  IF any worsening SOB or difficulty breathing advised him to immediately go to ER.  He is to call his PCP on Monday to schedule follow-up.  This patient is a 74 y.o. male that meets the FDA criteria for Emergency Use Authorization of bamlanivimab or casirivimab\imdevimab.  Has a (+) direct SARS-CoV-2 viral test result  Has mild or moderate COVID-19   Is ? 74 years of age and weighs ? 40 kg  Is NOT hospitalized due to COVID-19  Is NOT requiring oxygen therapy or requiring an increase in baseline oxygen flow rate due to COVID-19  Is within 10 days of symptom onset  Has at least one of the high risk factor(s) for progression to severe COVID-19 and/or hospitalization as defined in EUA.  Specific high risk criteria : >/= 72 yo   I have spoken and communicated the following to the patient or parent/caregiver:  1. FDA has authorized the emergency use of bamlanivimab and casirivimab\imdevimab for the treatment of mild to moderate COVID-19 in adults and pediatric patients with positive results of direct SARS-CoV-2 viral testing who are 31 years of age and older weighing at least 40 kg, and who are at high risk for progressing to severe COVID-19 and/or hospitalization.  2. The  significant known and potential risks and benefits of bamlanivimab and casirivimab\imdevimab, and the extent to which such potential risks and benefits are unknown.  3. Information on available alternative treatments and the risks and benefits of those alternatives, including clinical trials.  4. Patients treated with bamlanivimab and casirivimab\imdevimab should continue to self-isolate and use infection control measures (e.g., wear mask, isolate, social distance, avoid sharing personal items, clean and disinfect "high touch" surfaces, and frequent handwashing) according to CDC guidelines.   5. The patient or parent/caregiver has the option to accept or refuse bamlanivimab or casirivimab\imdevimab .  After reviewing this information with the patient, he does not qualify, refer to note above. Barbaraann Faster CANNADY 07/02/2019 9:47 AM

## 2019-07-02 NOTE — Telephone Encounter (Signed)
Could you help getting pt scheduled for follow up from ED with Dr. Lorelei Pont. I have seen him twice recently and have followed up with him by my chart. He has covid. Recent dehydration and went to ED. They rehydrated him did labs and decided did not require admission.   ED contacte antibody infusion clinic.  Thanks,

## 2019-07-04 NOTE — Telephone Encounter (Signed)
Called and scheduled E Visit with Dr. Lorelei Pont on 07/07/2019.

## 2019-07-06 NOTE — Progress Notes (Signed)
[place] [facility] [address], [place], [place] [address] [phone]  Date:  07/07/2019   Name:  Brandon Stone   DOB:  01/01/1946   MRN:  [id number]  PCP:  Darreld Mclean, MD    Chief Complaint: No chief complaint on file.   History of Present Illness:  Brandon Stone is a 74 y.o. very pleasant male patient who presents with the following:  Virtual visit today for hospital follow-up Patient location is home, provider location is office Patient identity confirmed with 2 factors, he gives consent for virtual visit today The patient and myself are present on the phone call today  Patient with history of diabetes, CLL, hyperlipidemia, hypertension, subdural hematoma  He was seen in the ER on January 8 with concern of possible complications of [id number] In the ER he had sats down to 92% with walking.  He was hydrated and discharged to home His potassium was significantly low at 2.7-not yet rechecked- will need to be done later   He tested positive for COVID-19 on 12/30 He had sx of flu like illness-this when he got tested He is feeling gradually better since he had his fluids in the ER  His rectal temp was 105 in the ER per pt He is not checking his home temperatures right now but has not suspected any fever  He is eating ok at this time His wife is giving him a bland diet due to diarrhea- this is now resolved; he is trying to expand his diet His wife also had covid, but she was asymptomatic  His pulse is 86, sat 99% on his home O2 sat meter  Overall he is feeling much better, feels like he is on the mend Patient Active Problem List   Diagnosis Date Noted  . 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 Diabetes mellitus without complication ([place]) 25/18/9842  . Symptomatic cholelithiasis 09/18/2016  . Chronic lymphocytic leukemia (CLL), B-cell ([place]) 05/13/2014  . Stable angina ([place]) 01/26/2012  . Sore throat 11/03/2010  .  Hyperlipidemia 06/21/2008  . HYPERTENSION, BENIGN 06/21/2008  . CAD, NATIVE VESSEL 06/21/2008    Past Medical History:  Diagnosis Date  . Anginal pain ([place])   . Arthritis   . Barrett's esophagus   . BPH (benign prostatic hypertrophy)   . CAD (coronary artery disease)     a. CABG 1991;  b.  [place];  c.  S/P PTCA  stents to SVG ot OM last in 2003;  d.  Redo CABG x 2 in 2004 (VG->OM->LCX, LRA->PDA);  e. 12/2011 Cath: 3vd, sev LM into LCX dzs, VG->OM 100, LIMA->DIAG->LAD patent, Rad Art->PDA patent, EF 45%, Med Rx.  . Diverticulosis   . DM (diabetes mellitus) ([place])   . Dyslipidemia   . Dyspnea   . Gallstones   . GERD (gastroesophageal reflux disease)   . Gout   . History of hiatal hernia   . HTN (hypertension)   . Hyperlipidemia   . Leukemia in remission ([place])   . Psoriasis   . Rheumatic fever    as a child  . S/P coronary artery stent placement 1998  . TIA (transient ischemic attack)    FOUND ON MRI  PER PATIENT-     NO PROBLEM    Past Surgical History:  Procedure Laterality Date  . APPENDECTOMY    . CHOLECYSTECTOMY N/A 09/18/2016   Procedure: LAPAROSCOPIC CHOLECYSTECTOMY WITH INTRAOPERATIVE CHOLANGIOGRAM;  Surgeon: Jackolyn Confer, MD;  Location: [facility];  Service: General;  Laterality: N/A;  . COLONOSCOPY  07/2013  . CORONARY ANGIOPLASTY     STENTS  . CORONARY ARTERY BYPASS GRAFT     1991 (LIMA to LAD/Diag;  SVG to RCA;  SVG to OM)  . CORONARY ARTERY BYPASS GRAFT     2004 (L radial to PDA; SVG to OM1/ distal LCx)  . TONSILLECTOMY AND ADENOIDECTOMY    . UMBILICAL HERNIA REPAIR      Social History   Tobacco Use  . Smoking status: Former Smoker    Years: 8.00    Quit date: 06/24/1971    Years since quitting: 48.0  . Smokeless tobacco: Never Used  Substance Use Topics  . Alcohol use: No  . Drug use: No    Family History  Problem Relation Age of Onset  . Heart disease Mother   . Hypertension Mother   . Hyperlipidemia Mother   . Diabetes Mother   . Skin  cancer Mother        skin  . Heart disease Father   . Hypertension Father   . Hyperlipidemia Father   . Diabetes Father   . Stroke Sister   . Stroke Brother   . Colon cancer Neg Hx     Allergies  Allergen Reactions  . Other Hives    Intolerance to strong pain medications  Rash to opsite and tegaderm tape.  . Ticlopidine Hcl Hives and Itching  . Black Pepper [[place]] Palpitations    HEART PALPITATIONS HEART PALPITATIONS  . Codeine Palpitations and Other (See Comments)    Wild dreams, palpitations "wild dreams"    Medication list has been reviewed and updated.  Current Outpatient Medications on File Prior to Visit  Medication Sig Dispense Refill  . acetaminophen (TYLENOL) 325 MG tablet Take 325 mg by mouth every 6 (six) hours as needed for mild pain.    Marland Kitchen albuterol (VENTOLIN HFA) 108 (90 Base) MCG/ACT inhaler Inhale 2 puffs into the lungs every 6 (six) hours as needed. 18 g 0  . allopurinol (ZYLOPRIM) 300 MG tablet Take 1 tablet (300 mg total) by mouth daily. 90 tablet 3  . amLODipine (NORVASC) 5 MG tablet Take 1 tablet (5 mg total) by mouth daily. (Patient taking differently: Take 5 mg by mouth daily. 06/15/2019 Also takes 2.69m  (1/2 tab) in the evening.) 90 tablet 3  . atorvastatin (LIPITOR) 80 MG tablet TAKE 1 TABLET EVERY DAY 90 tablet 3  . azithromycin (ZITHROMAX) 250 MG tablet Take 2 tablets by mouth on day 1, followed by 1 tablet by mouth daily for 4 days. 6 tablet 0  . benzonatate (TESSALON) 100 MG capsule TAKE 1 CAPSULE BY MOUTH THREE TIMES A DAY AS NEEDED FOR COUGH 30 capsule 0  . blood glucose meter kit and supplies Dispense based on patient and insurance preference. Use up to four times daily as directed. (FOR ICD-10 E10.9, E11.9). 1 each 0  . ezetimibe (ZETIA) 10 MG tablet Take 1 tablet (10 mg total) by mouth daily. 90 tablet 3  . isosorbide mononitrate (IMDUR) 60 MG 24 hr tablet TAKE 1 TABLET EVERY DAY 90 tablet 3  . Loperamide HCl (IMODIUM PO) Take 1 tablet by  mouth daily as needed (loosse stools).    . loratadine (CLARITIN) 10 MG tablet Take 10 mg by mouth daily as needed for allergies.    . metFORMIN (GLUCOPHAGE) 500 MG tablet Take 1 tablet (500 mg total) by mouth 2 (two) times daily with a meal.  180 tablet 3  . metoprolol succinate (TOPROL-XL) 25 MG 24 hr tablet Take 1 tablet (25 mg total) by mouth 2 (two) times daily. Please call to schedule appointment before further refills [phone] 180 tablet 0  . MULTIPLE VITAMIN-FOLIC ACID PO Take 1 tablet by mouth daily. Contains 825 mg folic acid    . nitroGLYCERIN (NITROSTAT) 0.4 MG SL tablet Place 1 tablet (0.4 mg total) under the tongue every 5 (five) minutes as needed for chest pain. 25 tablet 3  . Omega-3 Fatty Acids (FISH OIL) 1200 MG CAPS Take 1 capsule by mouth every evening.     Marland Kitchen omeprazole (PRILOSEC) 20 MG capsule TAKE 1 CAPSULE TWICE DAILY 180 capsule 3  . OVER THE COUNTER MEDICATION 2 (two) times daily as needed. Magnilife DB-Pain relieving foot cream.    . potassium chloride SA (KLOR-CON) 20 MEQ tablet TAKE 1 TABLET EVERY EVENING 90 tablet 0  . triamcinolone cream (KENALOG) 0.1 % Apply 1 application topically 2 (two) times daily. Use as needed for dry or itchy skin 30 g 2  . valsartan-hydrochlorothiazide (DIOVAN-HCT) 160-25 MG tablet TAKE 1 TABLET EVERY DAY 90 tablet 3   No current facility-administered medications on file prior to visit.    Review of Systems:  As per HPI- otherwise negative.  No current fevers Physical Examination: There were no vitals filed for this visit. There were no vitals filed for this visit. There is no height or weight on file to calculate BMI. Ideal Body Weight:    Patient observed over video monitor.  He looks well, no cough, wheezing, distress is noted  Assessment and Plan: Hypokalemia  COVID-19 virus infection  Virtual visit today to follow-up from recent ER visit for COVID-19.  Patient was diagnosed with COVID-19 on December 30.  He is now  feeling quite a bit better.  He does need to come in and have routine labs as well as a potassium level-we have not checked his A1c in some time  Lab Results  Component Value Date   HGBA1C 7.5 (H) 05/24/2018   I made him an appointment to be seen next week Medical decision making today is low  Signed Lamar Blinks, MD

## 2019-07-07 ENCOUNTER — Encounter: Payer: Self-pay | Admitting: Family Medicine

## 2019-07-07 ENCOUNTER — Ambulatory Visit (INDEPENDENT_AMBULATORY_CARE_PROVIDER_SITE_OTHER): Payer: Medicare HMO | Admitting: Family Medicine

## 2019-07-07 ENCOUNTER — Other Ambulatory Visit: Payer: Self-pay

## 2019-07-07 DIAGNOSIS — U071 COVID-19: Secondary | ICD-10-CM | POA: Diagnosis not present

## 2019-07-07 DIAGNOSIS — E876 Hypokalemia: Secondary | ICD-10-CM | POA: Diagnosis not present

## 2019-07-11 NOTE — Patient Instructions (Addendum)
It was good to see you again today, I will be in touch with your labs ASAP Assuming you do not have a UTI and that kidney function is normal, we might consider a medication such as flomax to help ease urination symptoms  I would encourage you to get a covid 19 vaccine as soon as you are able
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
## 2019-07-12 ENCOUNTER — Other Ambulatory Visit: Payer: Self-pay

## 2019-07-12 DIAGNOSIS — M4712 Other spondylosis with myelopathy, cervical region: Secondary | ICD-10-CM | POA: Diagnosis not present

## 2019-07-12 DIAGNOSIS — M542 Cervicalgia: Secondary | ICD-10-CM | POA: Diagnosis not present

## 2019-07-12 DIAGNOSIS — M9901 Segmental and somatic dysfunction of cervical region: Secondary | ICD-10-CM | POA: Diagnosis not present

## 2019-07-12 DIAGNOSIS — M47812 Spondylosis without myelopathy or radiculopathy, cervical region: Secondary | ICD-10-CM | POA: Diagnosis not present

## 2019-07-13 ENCOUNTER — Other Ambulatory Visit: Payer: Self-pay

## 2019-07-13 ENCOUNTER — Encounter: Payer: Self-pay | Admitting: Family Medicine

## 2019-07-13 ENCOUNTER — Ambulatory Visit (INDEPENDENT_AMBULATORY_CARE_PROVIDER_SITE_OTHER): Payer: Medicare HMO | Admitting: Family Medicine

## 2019-07-13 ENCOUNTER — Other Ambulatory Visit: Payer: Self-pay | Admitting: Medical

## 2019-07-13 VITALS — BP 126/70 | HR 78 | Temp 97.1°F | Resp 16 | Ht 71.0 in | Wt 207.0 lb

## 2019-07-13 DIAGNOSIS — E876 Hypokalemia: Secondary | ICD-10-CM

## 2019-07-13 DIAGNOSIS — R350 Frequency of micturition: Secondary | ICD-10-CM

## 2019-07-13 DIAGNOSIS — I10 Essential (primary) hypertension: Secondary | ICD-10-CM | POA: Diagnosis not present

## 2019-07-13 DIAGNOSIS — Z125 Encounter for screening for malignant neoplasm of prostate: Secondary | ICD-10-CM | POA: Diagnosis not present

## 2019-07-13 DIAGNOSIS — E119 Type 2 diabetes mellitus without complications: Secondary | ICD-10-CM

## 2019-07-13 DIAGNOSIS — E782 Mixed hyperlipidemia: Secondary | ICD-10-CM | POA: Diagnosis not present

## 2019-07-13 LAB — BASIC METABOLIC PANEL
BUN: 16 mg/dL (ref 6–23)
CO2: 30 mEq/L (ref 19–32)
Calcium: 10 mg/dL (ref 8.4–10.5)
Chloride: 101 mEq/L (ref 96–112)
Creatinine, Ser: 0.96 mg/dL (ref 0.40–1.50)
GFR: 77.19 mL/min (ref >60.00)
Glucose, Bld: 143 mg/dL — ABNORMAL HIGH (ref 70–99)
Potassium: 3.8 mEq/L (ref 3.5–5.1)
Sodium: 140 mEq/L (ref 135–145)

## 2019-07-13 LAB — POCT URINALYSIS DIP (MANUAL ENTRY)
Bilirubin, UA: NEGATIVE
Blood, UA: NEGATIVE
Glucose, UA: NEGATIVE mg/dL
Ketones, POC UA: NEGATIVE mg/dL
Leukocytes, UA: NEGATIVE
Nitrite, UA: NEGATIVE
Spec Grav, UA: 1.015 (ref 1.010–1.025)
Urobilinogen, UA: 0.2 E.U./dL
pH, UA: 6 (ref 5.0–8.0)

## 2019-07-13 LAB — HEMOGLOBIN A1C: Hgb A1c MFr Bld: 7.9 % — ABNORMAL HIGH (ref 4.6–6.5)

## 2019-07-13 LAB — LIPID PANEL
Cholesterol: 144 mg/dL (ref 0–200)
HDL: 41.3 mg/dL (ref >39.00)
NonHDL: 102.55
Total CHOL/HDL Ratio: 3
Triglycerides: 206 mg/dL — ABNORMAL HIGH (ref 0.0–149.0)
VLDL: 41.2 mg/dL — ABNORMAL HIGH (ref 0.0–40.0)

## 2019-07-13 LAB — PSA: PSA: 1.74 ng/mL (ref 0.10–4.00)

## 2019-07-13 LAB — LDL CHOLESTEROL, DIRECT: Direct LDL: 82 mg/dL

## 2019-07-13 MED ORDER — POTASSIUM CHLORIDE CRYS ER 20 MEQ PO TBCR: 20.0000 meq | EXTENDED_RELEASE_TABLET | Freq: Every evening | ORAL | 3 refills | Status: DC

## 2019-07-14 LAB — URINE CULTURE
MICRO NUMBER:: 10061623
Result:: NO GROWTH
SPECIMEN QUALITY:: ADEQUATE

## 2019-07-15 ENCOUNTER — Encounter: Payer: Self-pay | Admitting: Family Medicine

## 2019-07-15 DIAGNOSIS — E119 Type 2 diabetes mellitus without complications: Secondary | ICD-10-CM

## 2019-07-15 DIAGNOSIS — E782 Mixed hyperlipidemia: Secondary | ICD-10-CM

## 2019-07-18 MED ORDER — METFORMIN HCL 500 MG PO TABS: 500.0000 mg | ORAL_TABLET | Freq: Three times a day (TID) | ORAL | 3 refills | Status: DC

## 2019-07-18 MED ORDER — ROSUVASTATIN CALCIUM 40 MG PO TABS: 40.0000 mg | ORAL_TABLET | Freq: Every day | ORAL | 3 refills | Status: DC

## 2019-07-18 NOTE — Telephone Encounter (Signed)
Called patient, he states you wanted him to start on rosuvastatin. He checked with [facility] and they do cover that under a 90 day supply for free. If you sent this medication in, he would like to know if he is to continue lipitor as well? Also zetia?   He mentioned that you would like to increase metformin as well. What dose did you want to increase it to?  He would like to hold off on urology for now as he found out his wife dose in fact have cancer.

## 2019-07-22 ENCOUNTER — Telehealth: Payer: Self-pay | Admitting: Internal Medicine

## 2019-07-22 NOTE — Telephone Encounter (Signed)
The patient has been notified of the lab result and verbalized understanding.  All questions (if any) were answered. Frederik Schmidt, RN 07/22/2019 9:33 AM   The patient said that he was recently prescribed Rosuvastatin 40 mg by his PCP, but has not picked it up yet to start.  He would like to try this first and check results.

## 2019-07-22 NOTE — Telephone Encounter (Signed)
New Message    Pt is returning call for results   Please call back  

## 2019-07-25 ENCOUNTER — Other Ambulatory Visit: Payer: Self-pay | Admitting: Internal Medicine

## 2019-07-25 NOTE — Telephone Encounter (Signed)
Left detailed message (DPR) - To start rosuvastatin that was prescribed by PCP and that it is recommended that he have lipids and liver function checked in 2-3 months.  This looks like it coincides with his next appointment in [facility] and he can have labs checked there if it is planned.   Otherwise adv that he should call back and schedule f/u labs with our office.

## 2019-07-25 NOTE — Telephone Encounter (Signed)
Starting rosuvastatin will bring LDL to goal < 70 since baseline is 82, he should not need PCKS9i therapy. Would recommend rechecking lipid panel and LFTs in 2-3 months.

## 2019-08-03 ENCOUNTER — Encounter: Payer: Self-pay | Admitting: Family Medicine

## 2019-08-03 NOTE — Addendum Note (Signed)
Addended by: Wynonia Musty A on: 08/03/2019 04:40 PM   Modules accepted: Orders

## 2019-08-09 DIAGNOSIS — M47812 Spondylosis without myelopathy or radiculopathy, cervical region: Secondary | ICD-10-CM | POA: Diagnosis not present

## 2019-08-09 DIAGNOSIS — M542 Cervicalgia: Secondary | ICD-10-CM | POA: Diagnosis not present

## 2019-08-09 DIAGNOSIS — M4712 Other spondylosis with myelopathy, cervical region: Secondary | ICD-10-CM | POA: Diagnosis not present

## 2019-08-09 DIAGNOSIS — M9901 Segmental and somatic dysfunction of cervical region: Secondary | ICD-10-CM | POA: Diagnosis not present

## 2019-08-19 IMAGING — CT CT HEAD WITHOUT CONTRAST
4 series · 16 of 47 positions shown, 18 images · non-contrast
Comparison: 09/27/2018

CLINICAL DATA: Dizziness and headache. History of subdural
hematoma.

EXAM:
CT HEAD WITHOUT CONTRAST
TECHNIQUE: Contiguous axial images were obtained from the base of the skull
through the vertex without intravenous contrast.

[Series 3: head wo · axial · 0.48mm/px · z∈[-84,+46]mm · 7 of 36 slices shown, 9 images]
[im 5/36  brain]
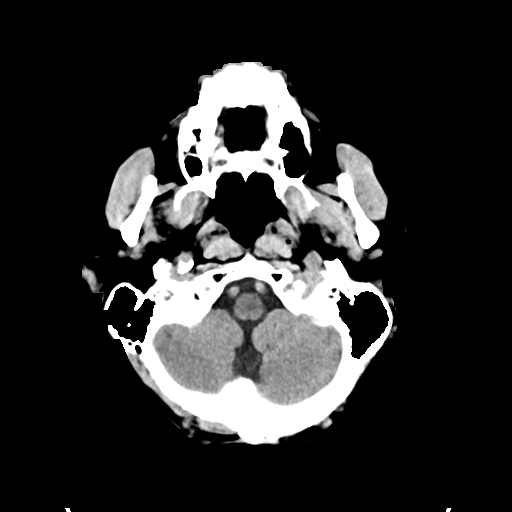
[im 5/36  bone]
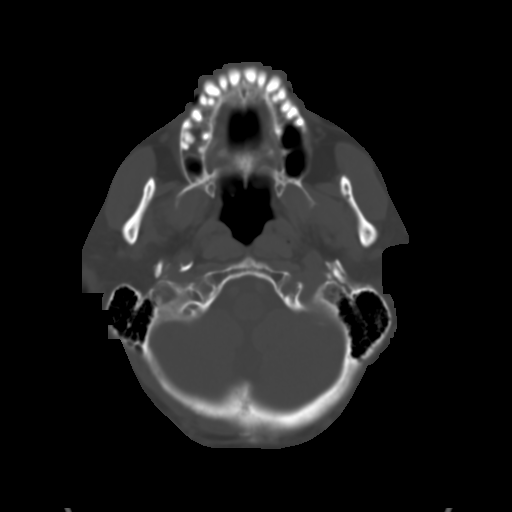
[im 9/36  brain]
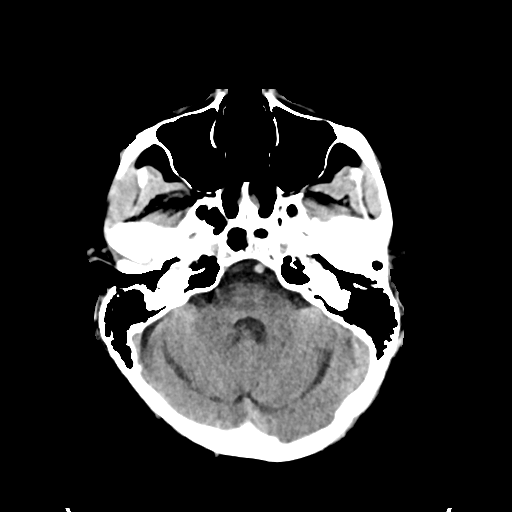
[im 14/36  brain]
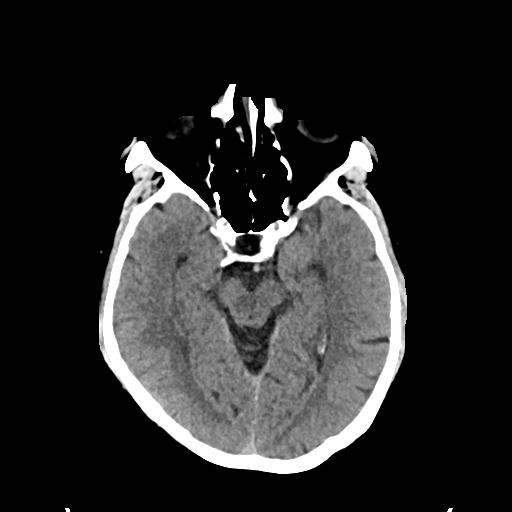
[im 18/36  brain]
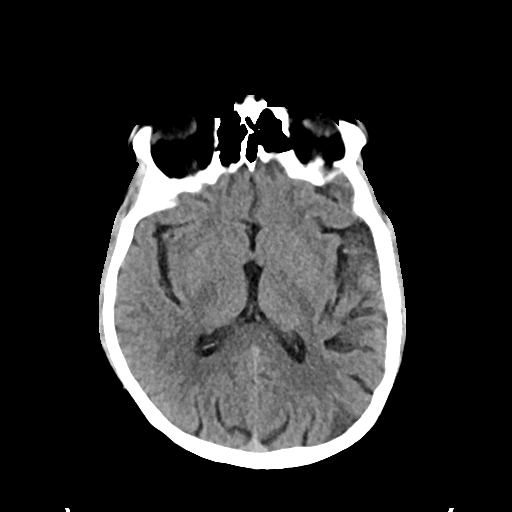
[im 22/36  brain]
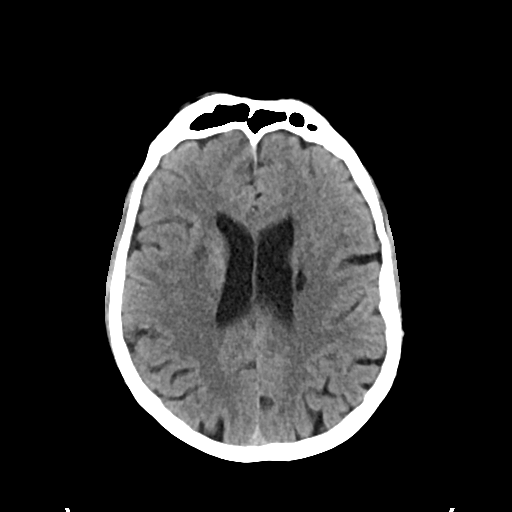
[im 22/36  bone]
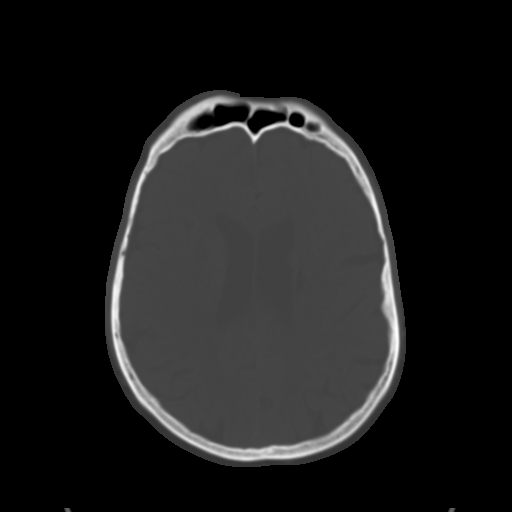
[im 27/36  brain]
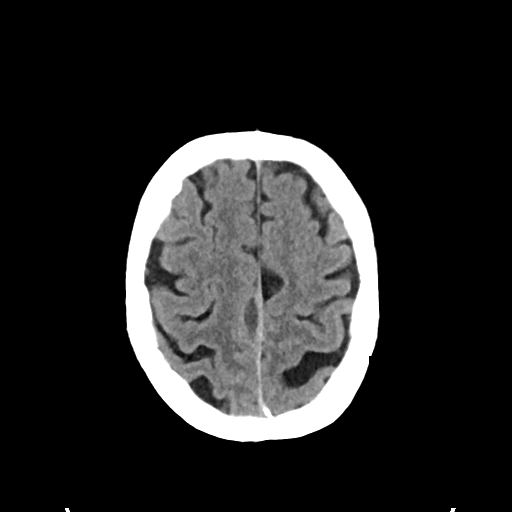
[im 31/36  brain]
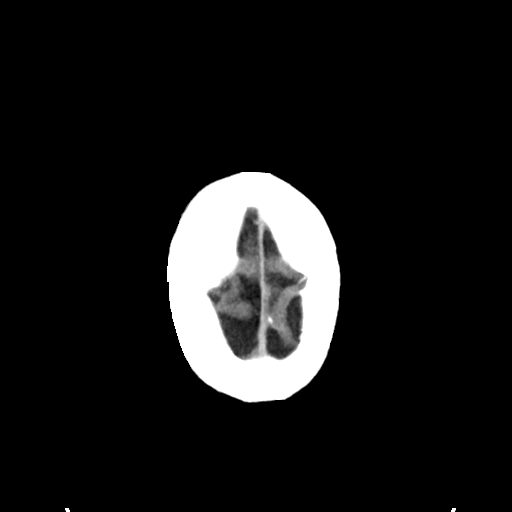

[Series 4: head bone · axial · 0.48mm/px · z∈[-88,-52]mm · 3 of 90 slices shown]
[im 9/90  bone]
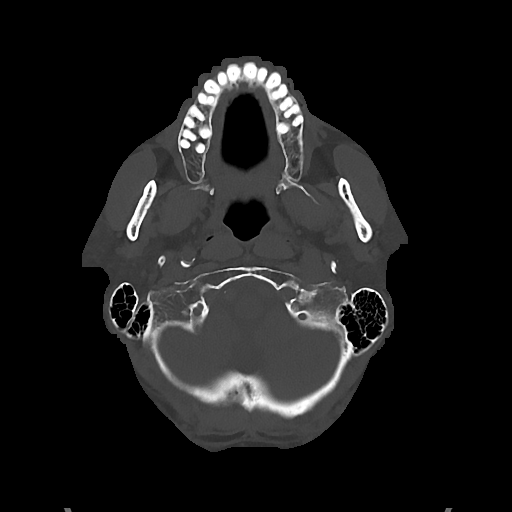
[im 18/90  bone]
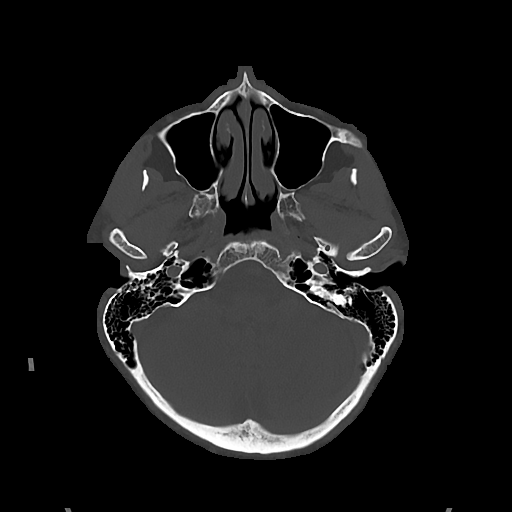
[im 27/90  bone]
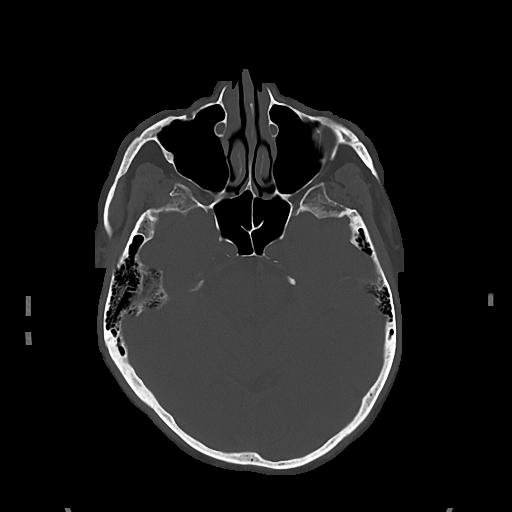

[Series 5: cor soft · coronal · 0.35mm/px · 3 of 71 slices shown]
[im 24/71  brain]
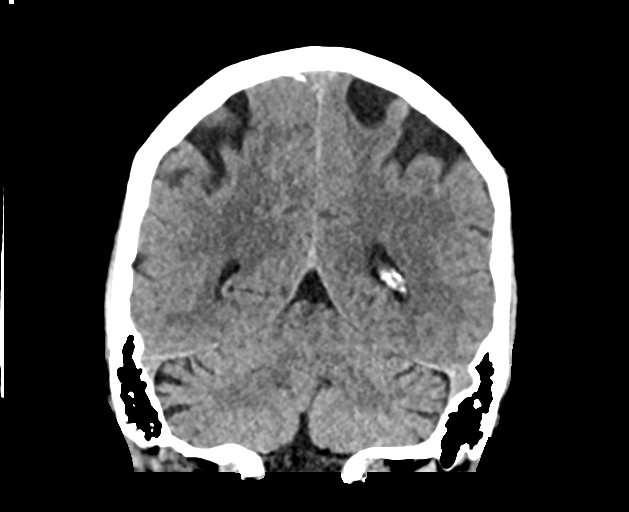
[im 32/71  brain]
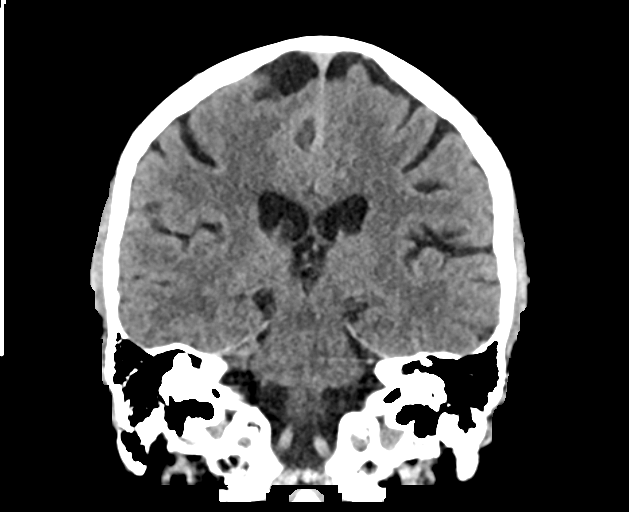
[im 39/71  brain]
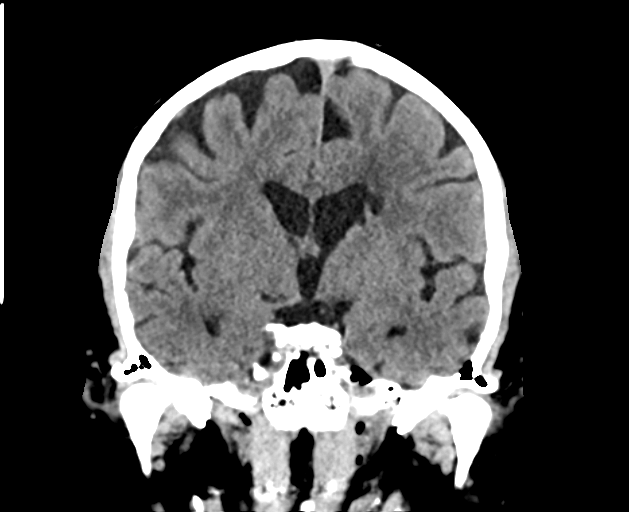

[Series 6: sag soft · sagittal · 0.35mm/px · 3 of 67 slices shown]
[im 23/67  brain]
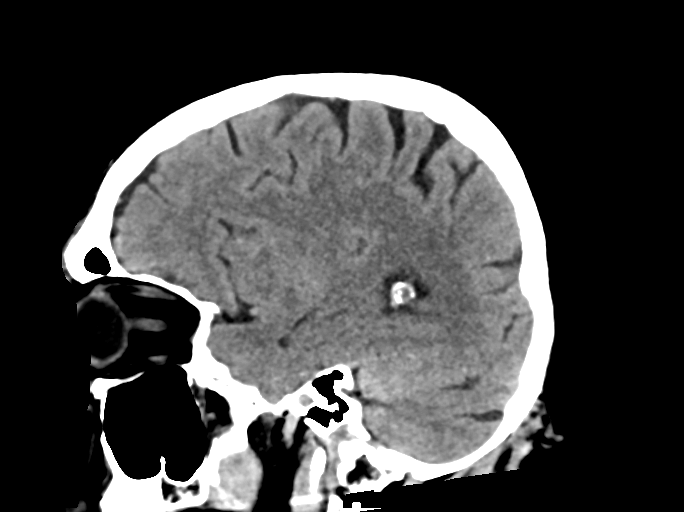
[im 34/67  brain]
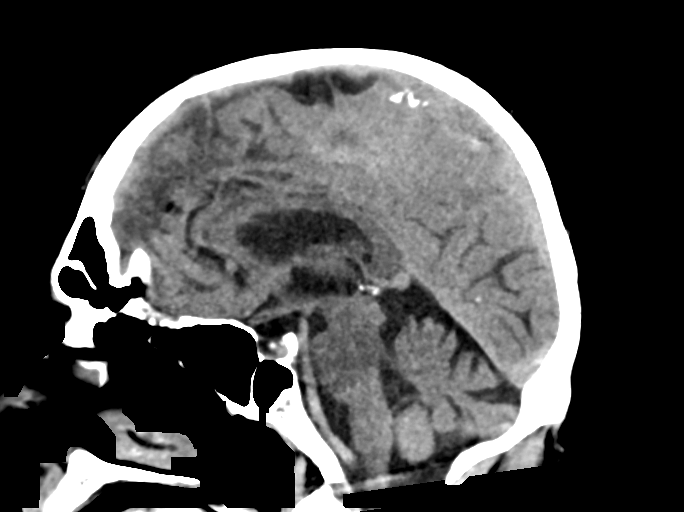
[im 45/67  brain]
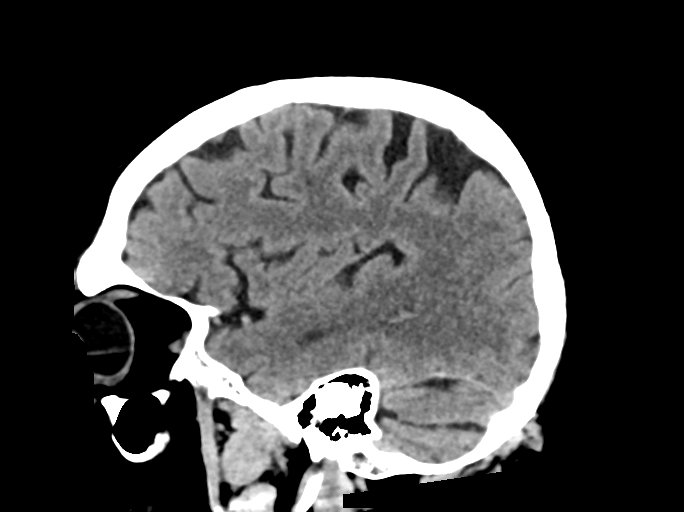

[16 of 47 positions shown; findings below may reference images not displayed]

FINDINGS: Brain: Small residual right parafalcine subdural hematoma is now
predominantly hypoattenuating and has decreased further in size
measuring 2.3 x 1.0 cm (previously 2.9 x 1.2 cm). No new
intracranial hemorrhage or acute infarct is identified. There is no
midline shift. Chronic lacunar infarcts are again noted in the
bilateral corona radiata and left caudate body. Mild cerebral
atrophy is not greater than expected for age.

Vascular: Calcified atherosclerosis at the skull base. No hyperdense
vessel.

Skull: No fracture or suspicious osseous lesion.

Sinuses/Orbits: Paranasal sinuses and mastoid air cells are clear.
Unremarkable orbits.

Other: None.
IMPRESSION: 1. Small residual right parafalcine subdural hematoma, decreased in
size and density from prior.
2. No new intracranial abnormality.

## 2019-08-19 NOTE — Progress Notes (Signed)
Cardiology Office Note   Date:  08/22/2019   ID:  Brandon Stone, DOB [birth year], MRN [id number]  PCP:  Darreld Mclean, MD  Cardiologist:   Dorris Carnes, MD     F/U of CAD    History of Present Illness: Brandon Stone is a 74 y.o. male with a history of CAD (s/p CABG in 1999; [place] in 1998. Redo CABG in 2004 (SVG to OM/LCx; LRA to PDA). Cath in July 2013 due to increased CP showed severe LM disease into Lcx.; SVG to OM 100%; LIMA to Diag and LAD patent; Radial Artery to PDA patent LVEF 45% Plan was to continue medical Rx. 2018    Echo showed LVEF normal  Mild diastolic dysfunciton.     I saw the pt in Feb 2020  The patient said he has been doing well from a cardiac standpoint.  He denies chest pain.  He did get a COVID-19 infection back in December actually needed IV fluids and an ER visit.  He is he is recovering.  He does note some shortness of breath when he picks up his dog (along with some shoulder pain and picking her up).  This is not progressive he does not he says with walking he does not have it if he pushes himself further he will get some shortness of breath but again does not seem to be increasing.  His weight was down to 195 but is picked back up into the low 200s.  His goal is 190  The patient had lipids done in January.  LDL was 102.  Was recommended that he try rosuvastatin 40 mg.  He tried this for a few days and had to stop because of nausea and abdominal discomfort.  He is now taking Lipitor and Zetia.  Current Meds  Medication Sig  . acetaminophen (TYLENOL) 325 MG tablet Take 325 mg by mouth every 6 (six) hours as needed for mild pain.  Marland Kitchen albuterol (VENTOLIN HFA) 108 (90 Base) MCG/ACT inhaler USE 2 PUFFS INTO THE LUNGS EVERY 6 HOURS AS NEEDED  . allopurinol (ZYLOPRIM) 300 MG tablet Take 1 tablet (300 mg total) by mouth daily.  Marland Kitchen amLODipine (NORVASC) 5 MG tablet Take 5 mg by mouth daily.  Marland Kitchen atorvastatin (LIPITOR) 80 MG tablet Take 80 mg by mouth daily.  .  benzonatate (TESSALON) 100 MG capsule TAKE 1 CAPSULE BY MOUTH THREE TIMES A DAY AS NEEDED FOR COUGH  . blood glucose meter kit and supplies Dispense based on patient and insurance preference. Use up to four times daily as directed. (FOR ICD-10 E10.9, E11.9).  Marland Kitchen ezetimibe (ZETIA) 10 MG tablet Take 1 tablet (10 mg total) by mouth daily. Please make overdue appt with Dr. Harrington Challenger before anymore refills. 1st attempt  . isosorbide mononitrate (IMDUR) 60 MG 24 hr tablet TAKE 1 TABLET EVERY DAY  . Loperamide HCl (IMODIUM PO) Take 1 tablet by mouth daily as needed (loosse stools).  . loratadine (CLARITIN) 10 MG tablet Take 10 mg by mouth daily as needed for allergies.  . metFORMIN (GLUCOPHAGE) 500 MG tablet Take 1 tablet (500 mg total) by mouth 3 (three) times daily.  . metoprolol succinate (TOPROL-XL) 25 MG 24 hr tablet Take 1 tablet (25 mg total) by mouth 2 (two) times daily. Please call to schedule appointment before further refills [phone]  . MULTIPLE VITAMIN-FOLIC ACID PO Take 1 tablet by mouth daily. Contains 165 mg folic acid  . nitroGLYCERIN (NITROSTAT) 0.4 MG SL tablet Place 1 tablet (0.4 mg  total) under the tongue every 5 (five) minutes as needed for chest pain.  . Omega-3 Fatty Acids (FISH OIL) 1200 MG CAPS Take 1 capsule by mouth every evening.   Marland Kitchen omeprazole (PRILOSEC) 20 MG capsule TAKE 1 CAPSULE TWICE DAILY  . OVER THE COUNTER MEDICATION 2 (two) times daily as needed. Magnilife DB-Pain relieving foot cream.  . potassium chloride SA (KLOR-CON) 20 MEQ tablet Take 1 tablet (20 mEq total) by mouth every evening.  . triamcinolone cream (KENALOG) 0.1 % Apply 1 application topically 2 (two) times daily. Use as needed for dry or itchy skin  . valsartan-hydrochlorothiazide (DIOVAN-HCT) 160-25 MG tablet TAKE 1 TABLET EVERY DAY     Allergies:   Other, Ticlopidine hcl, Black pepper [piper], and Codeine   Past Medical History:  Diagnosis Date  . Anginal pain ([place])   . Arthritis   . Barrett's  esophagus   . BPH (benign prostatic hypertrophy)   . CAD (coronary artery disease)     a. CABG 1991;  b.  [place];  c.  S/P PTCA  stents to SVG ot OM last in 2003;  d.  Redo CABG x 2 in 2004 (VG->OM->LCX, LRA->PDA);  e. 12/2011 Cath: 3vd, sev LM into LCX dzs, VG->OM 100, LIMA->DIAG->LAD patent, Rad Art->PDA patent, EF 45%, Med Rx.  . Diverticulosis   . DM (diabetes mellitus) ([place])   . Dyslipidemia   . Dyspnea   . Gallstones   . GERD (gastroesophageal reflux disease)   . Gout   . History of hiatal hernia   . HTN (hypertension)   . Hyperlipidemia   . Leukemia in remission ([place])   . Psoriasis   . Rheumatic fever    as a child  . S/P coronary artery stent placement 1998  . TIA (transient ischemic attack)    FOUND ON MRI  PER PATIENT-     NO PROBLEM    Past Surgical History:  Procedure Laterality Date  . APPENDECTOMY    . CHOLECYSTECTOMY N/A 09/18/2016   Procedure: LAPAROSCOPIC CHOLECYSTECTOMY WITH INTRAOPERATIVE CHOLANGIOGRAM;  Surgeon: Jackolyn Confer, MD;  Location: [facility];  Service: General;  Laterality: N/A;  . COLONOSCOPY  07/2013  . CORONARY ANGIOPLASTY     STENTS  . CORONARY ARTERY BYPASS GRAFT     1991 (LIMA to LAD/Diag;  SVG to RCA;  SVG to OM)  . CORONARY ARTERY BYPASS GRAFT     2004 (L radial to PDA; SVG to OM1/ distal LCx)  . TONSILLECTOMY AND ADENOIDECTOMY    . UMBILICAL HERNIA REPAIR       Social History:  The patient  reports that he quit smoking about 48 years ago. He quit after 8.00 years of use. He has never used smokeless tobacco. He reports that he does not drink alcohol or use drugs.   Family History:  The patient's family history includes Diabetes in his father and mother; Heart disease in his father and mother; Hyperlipidemia in his father and mother; Hypertension in his father and mother; Skin cancer in his mother; Stroke in his brother and sister.    ROS:  Please see the history of present illness. All other systems are reviewed and  Negative to the  above problem except as noted.    PHYSICAL EXAM: VS:  BP 122/66   Pulse 72   Ht _0  (1.803 m)   Wt 211 lb 12.8 oz (96.1 kg)   SpO2 97%   BMI 29.54 kg/m   GEN: Obese 74 yo, in  no acute distress  HEENT: normal  Neck: JVP is not elevated    No carotid bruits Cardiac: RRR; no murmurs, rubs, or gallops,no edema  Respiratory:  clear to auscultation bilaterally, normal work of breathing GI: soft, nontender, distended, + BS  No hepatomegaly  MS: no deformity Moving all extremities   Skin: warm and dry, no rash Neuro:  Strength and sensation are intact Psych: euthymic mood, full affect   EKG:  EKG is not ordered today.   Lipid Panel    Component Value Date/Time   CHOL 144 07/13/2019 1330   CHOL 109 11/30/2018 0857   TRIG 206.0 (H) 07/13/2019 1330   HDL 41.30 07/13/2019 1330   HDL 32 (L) 11/30/2018 0857   CHOLHDL 3 07/13/2019 1330   VLDL 41.2 (H) 07/13/2019 1330   LDLCALC 49 11/30/2018 0857   LDLDIRECT 82.0 07/13/2019 1330      Wt Readings from Last 3 Encounters:  08/22/19 211 lb 12.8 oz (96.1 kg)  07/13/19 207 lb (93.9 kg)  07/01/19 201 lb (91.2 kg)      ASSESSMENT AND PLAN:  1   CAD doing well.  No episodes of angina.  I am not convinced that his shortness of breath is an anginal equivalent and I have encouraged him to stay active and increase his activity.  He may be experiencing some of the symptoms because of the recent Covid infection.  Would follow for now.  Call if things worsen.  2  HTN   blood pressure is controlled.  I would continue current regimen   3  HL patient's recent lipids are not where they should be.  He did not cut tolerate Crestor.  Will ask lipid clinic to evaluate and see if his insurance will now cover Repatha if that is a will I would add this to his regimen and follow lipids, tapering other medicines as lipids allow.   4  Obesity discussed his weight.  He was in the 190s earlier last year.  Encouraged him to try to get back to this  goal.  5  Heme patient followed by Burney Gauze.  Plan for follow-up next January.  Sooner, if problems develop. Current medicines are reviewed at length with the patient today.  The patient does not have concerns regarding medicines.  Signed, Dorris Carnes, MD  08/22/2019 8:42 AM    [facility] [address], [place], [place]  [address] Phone: [phone]; Fax: [phone]

## 2019-08-22 ENCOUNTER — Encounter: Payer: Self-pay | Admitting: Internal Medicine

## 2019-08-22 ENCOUNTER — Ambulatory Visit: Payer: Medicare HMO | Admitting: Internal Medicine

## 2019-08-22 ENCOUNTER — Other Ambulatory Visit: Payer: Self-pay

## 2019-08-22 VITALS — BP 122/66 | HR 72 | Ht 71.0 in | Wt 211.8 lb

## 2019-08-22 DIAGNOSIS — I2510 Atherosclerotic heart disease of native coronary artery without angina pectoris: Secondary | ICD-10-CM

## 2019-08-22 DIAGNOSIS — E785 Hyperlipidemia, unspecified: Secondary | ICD-10-CM | POA: Diagnosis not present

## 2019-08-22 MED ORDER — EZETIMIBE 10 MG PO TABS: 10.0000 mg | ORAL_TABLET | Freq: Every day | ORAL | 3 refills | Status: DC

## 2019-08-22 NOTE — Patient Instructions (Signed)
Medication Instructions:  No changes *If you need a refill on your cardiac medications before your next appointment, please call your pharmacy*  Lab Work: None ordered  Testing/Procedures: None ordered   Follow-Up: At [facility], you and your health needs are our priority.  As part of our continuing mission to provide you with exceptional heart care, we have created designated Provider Care Teams.  These Care Teams include your primary Cardiologist (physician) and Advanced Practice Providers (APPs -  Physician Assistants and Nurse Practitioners) who all work together to provide you with the care you need, when you need it.  Your next appointment:   10 month(s)  The format for your next appointment:   Either In Person or Virtual  Provider:   You may see Dorris Carnes, MD or one of the following Advanced Practice Providers on your designated Care Team:    Richardson Dopp, PA-C  [place]in [place], [place]  Daune Perch, [place]

## 2019-08-24 ENCOUNTER — Other Ambulatory Visit: Payer: Self-pay | Admitting: Internal Medicine

## 2019-08-29 ENCOUNTER — Other Ambulatory Visit: Payer: Self-pay

## 2019-08-29 ENCOUNTER — Ambulatory Visit (INDEPENDENT_AMBULATORY_CARE_PROVIDER_SITE_OTHER): Payer: Medicare HMO | Admitting: Pharmacist

## 2019-08-29 DIAGNOSIS — E785 Hyperlipidemia, unspecified: Secondary | ICD-10-CM | POA: Diagnosis not present

## 2019-08-29 MED ORDER — REPATHA SURECLICK 140 MG/ML ~~LOC~~ SOAJ: 1.0000 "pen " | SUBCUTANEOUS | 3 refills | Status: DC

## 2019-08-29 MED ORDER — ICOSAPENT ETHYL 1 G PO CAPS: 2.0000 g | ORAL_CAPSULE | Freq: Two times a day (BID) | ORAL | 3 refills | Status: DC

## 2019-08-29 NOTE — Progress Notes (Signed)
Patient ID: Brandon Stone                 DOB: 1946/06/21                    MRN: [id number]     HPI: Brandon Stone is a 74 y.o. male patient referred to lipid clinic by Brandon Stone. PMH is significant for CAD s/p CABG (1999 and redone in 2004), MI (1998), angina, dyslipidemia, diabetes, hypertension, TIA (1998), and a cath in 2013 showing severe disease with SVG to OM 100% but was deemed not a candidate for intervention.   At the visit today Brandon Stone reports he is adherent to his atorvastatin 80 mg, ezetimibe 10 mg daily, and fish oil. He is tolerating his medications well. He reports he is starting to increase his exercise by walking and possibly biking. He is understanding of his current LDL of 82 being above his goal of less than 55. When educated on how to administer Repatha, he demonstrated a good understanding of how to store and inject Repatha using a demonstration pen. He reports taking fish oil capsules from [employer] for his triglycerides which were slightly elevated at 206 in his last lipid panel. .   Current Medications: atorvastatin 80 mg daily, ezetimibe 10 mg daily, fish oil 1200 mg capsules daily Intolerances: rosuvastatin (nausea, abdominal discomfort) Risk Factors: weight, age, extensive cardiac disease history (CABG, MI, TIA, DM), family history  LDL goal: <55  Diet: spaghetti with meat sauce, fresh popped popcorn with olive oil, cereal with almond milk, cookies, potato chips   Exercise: walking off and on every few days for 15-30 minutes, starting to bike more, used to bike for 45-60 minutes   Family History: diabetes, HLD, HTN, and heart disease in father and mother, stroke in brother and sister, skin cancer in mother  Social History: The patient  reports that he quit smoking about 48 years ago. He quit after 8.00 years of use. He has never used smokeless tobacco. He reports that he does not drink alcohol or use drugs.   Labs: 07/13/19: TC 144, Trg 206, LDL 82, HDL 32 (atorvastatin  20m daily, ezetimibe 159mdaily, fish oil OTC)  Past Medical History:  Diagnosis Date  . Anginal pain ([facility]  . Arthritis   . Barrett's esophagus   . BPH (benign prostatic hypertrophy)   . CAD (coronary artery disease)     a. [place] 1991;  [place].  S/P PTCA  stents to SVG ot OM last in 2003;  d.  Redo CABG x 2 in 2004 (VG->OM->LCX, LRA->PDA);  e. 12/2011 Cath: 3vd, sev LM into LCX dzs, VG->OM 100, LIMA->DIAG->LAD patent, Rad Art->PDA patent, EF 45%, Med Rx.  . Diverticulosis   . DM (diabetes mellitus) ([facility] [place]  . Dyslipidemia   . Dyspnea   . Gallstones   . GERD (gastroesophageal reflux disease)   . Gout   . History of hiatal hernia   . HTN (hypertension)   . Hyperlipidemia   . Leukemia in remission ([place]  . Psoriasis   . Rheumatic fever    as a child  . S/P coronary artery stent placement 1998  . TIA (transient ischemic attack)    FOUND ON MRI  PER PATIENT-     NO PROBLEM    Current Outpatient Medications on File Prior to Visit  Medication Sig Dispense Refill  . acetaminophen (TYLENOL) 325 MG tablet Take 325 mg by mouth  every 6 (six) hours as needed for mild pain.    Marland Kitchen albuterol (VENTOLIN HFA) 108 (90 Base) MCG/ACT inhaler USE 2 PUFFS INTO THE LUNGS EVERY 6 HOURS AS NEEDED 18 g 0  . allopurinol (ZYLOPRIM) 300 MG tablet Take 1 tablet (300 mg total) by mouth daily. 90 tablet 3  . amLODipine (NORVASC) 5 MG tablet Take 5 mg by mouth daily.    Marland Kitchen atorvastatin (LIPITOR) 80 MG tablet Take 80 mg by mouth daily.    . benzonatate (TESSALON) 100 MG capsule TAKE 1 CAPSULE BY MOUTH THREE TIMES A DAY AS NEEDED FOR COUGH 30 capsule 0  . blood glucose meter kit and supplies Dispense based on patient and insurance preference. Use up to four times daily as directed. (FOR ICD-10 E10.9, E11.9). 1 each 0  . ezetimibe (ZETIA) 10 MG tablet Take 1 tablet (10 mg total) by mouth daily. 90 tablet 3  . isosorbide mononitrate (IMDUR) 60 MG 24 hr tablet TAKE 1 TABLET EVERY DAY 90 tablet 3  .  Loperamide HCl (IMODIUM PO) Take 1 tablet by mouth daily as needed (loosse stools).    . loratadine (CLARITIN) 10 MG tablet Take 10 mg by mouth daily as needed for allergies.    . metFORMIN (GLUCOPHAGE) 500 MG tablet Take 1 tablet (500 mg total) by mouth 3 (three) times daily. 270 tablet 3  . metoprolol succinate (TOPROL-XL) 25 MG 24 hr tablet Take 1 tablet (25 mg total) by mouth 2 (two) times daily. Please call to schedule appointment before further refills [phone] 180 tablet 0  . MULTIPLE VITAMIN-FOLIC ACID PO Take 1 tablet by mouth daily. Contains 015 mg folic acid    . nitroGLYCERIN (NITROSTAT) 0.4 MG SL tablet Place 1 tablet (0.4 mg total) under the tongue every 5 (five) minutes as needed for chest pain. 25 tablet 3  . Omega-3 Fatty Acids (FISH OIL) 1200 MG CAPS Take 1 capsule by mouth every evening.     Marland Kitchen omeprazole (PRILOSEC) 20 MG capsule TAKE 1 CAPSULE TWICE DAILY 180 capsule 3  . OVER THE COUNTER MEDICATION 2 (two) times daily as needed. Magnilife DB-Pain relieving foot cream.    . potassium chloride SA (KLOR-CON) 20 MEQ tablet Take 1 tablet (20 mEq total) by mouth every evening. 90 tablet 3  . triamcinolone cream (KENALOG) 0.1 % Apply 1 application topically 2 (two) times daily. Use as needed for dry or itchy skin 30 g 2  . valsartan-hydrochlorothiazide (DIOVAN-HCT) 160-25 MG tablet TAKE 1 TABLET EVERY DAY 90 tablet 3   No current facility-administered medications on file prior to visit.    Allergies  Allergen Reactions  . Other Hives    Intolerance to strong pain medications  Rash to opsite and tegaderm tape.  . Rosuvastatin     Nausea, abdominal discomfort  . Ticlopidine Hcl Hives and Itching  . Black Pepper [[place]] Palpitations    HEART PALPITATIONS HEART PALPITATIONS  . Codeine Palpitations and Other (See Comments)    Wild dreams, palpitations "wild dreams"    Assessment/Plan:  1. Hyperlipidemia - Uncontrolled hyperlipidemia in patient with extensive cardiac  history and cardiac risk factors with last LDL in 06/2019 of 82 (goal LDL <55) and triglycerides slightly elevated at 206 on high intensity statin (atorvastatin 27m daily), ezetimibe 179mdaily, and OTC fish oil. He is understanding and capable of self administering injections. Will start Repatha 140 mg subcutaneous injection once every 2 weeks and continue atorvastatin 80 mg and ezetimibe 10 mg daily. Will discontinue over  the counter fish oil and start Vascepa 2 grams twice daily for cardiovascular benefit. Prior authorizations have been submitted and approved, [employer] grant also approved. Lipid panel and LFTs scheduled for 10/31/19. Patient will follow up with the lipid clinic after labs result.   Pt seen by Eddie Candle, PGY1 pharmacy resident  [place], PharmD, BCACP, [facility] [address] [place], [place] [address] Phone: [phone]; Fax: [phone] 08/29/2019 10:33 AM

## 2019-08-29 NOTE — Patient Instructions (Addendum)
It was nice meeting you today!   Good job exercising, keep up the good work with walking and starting to bike.   Your last LDL in January 2021 was 82. Your LDL goal is <55.   Start taking Repatha 140 mg injection under the skin once every two weeks.   Start taking Vascepa 2 capsules twice daily. Stop taking the fish oil from [employer].   Continue atorvastatin 80 mg and ezetimibe 10 mg daily.   [place] is the grant we will apply you for.   We will call you when everything is approved.   Re-check fasting lab work on Monday May 10th at any time after 7:30 am.

## 2019-09-03 ENCOUNTER — Telehealth: Payer: Self-pay | Admitting: Internal Medicine

## 2019-09-03 NOTE — Telephone Encounter (Signed)
Please confirm if pt is on 81 mg ecASA

## 2019-09-05 NOTE — Telephone Encounter (Signed)
Confirmed patient is taking aspirin 81 mg daily.

## 2019-09-05 NOTE — Telephone Encounter (Signed)
Did not get an answer at home number. Sent MyChart message asking pt if he is taking asa 81 mg daily

## 2019-09-06 DIAGNOSIS — M542 Cervicalgia: Secondary | ICD-10-CM | POA: Diagnosis not present

## 2019-09-06 DIAGNOSIS — M47812 Spondylosis without myelopathy or radiculopathy, cervical region: Secondary | ICD-10-CM | POA: Diagnosis not present

## 2019-09-06 DIAGNOSIS — M9901 Segmental and somatic dysfunction of cervical region: Secondary | ICD-10-CM | POA: Diagnosis not present

## 2019-09-06 DIAGNOSIS — M4712 Other spondylosis with myelopathy, cervical region: Secondary | ICD-10-CM | POA: Diagnosis not present

## 2019-09-14 ENCOUNTER — Other Ambulatory Visit: Payer: Self-pay

## 2019-09-14 MED ORDER — ATORVASTATIN CALCIUM 80 MG PO TABS: 80.0000 mg | ORAL_TABLET | Freq: Every day | ORAL | 1 refills | Status: DC

## 2019-09-14 NOTE — Telephone Encounter (Signed)
[facility] mail order pharmacy is requesting a refill on Atorvastatin. Would Dr. Harrington Challenger like to refill this medication? Please address

## 2019-09-26 ENCOUNTER — Other Ambulatory Visit: Payer: Self-pay | Admitting: Family Medicine

## 2019-09-26 DIAGNOSIS — K219 Gastro-esophageal reflux disease without esophagitis: Secondary | ICD-10-CM

## 2019-09-27 ENCOUNTER — Other Ambulatory Visit: Payer: Self-pay | Admitting: Internal Medicine

## 2019-09-30 NOTE — Progress Notes (Signed)
Nurse connected with patient 10/03/19 at 10:15 AM EDT by a telephone enabled telemedicine application and verified that I am speaking with the correct person using two identifiers. Patient stated full name and DOB. Patient gave permission to continue with virtual visit. Patient's location was at home and Nurse's location was at [place] office.   Subjective:   Brandon Stone is a 74 y.o. male who presents for Medicare Annual/Subsequent preventive examination.  Review of Systems:  Home Safety/Smoke Alarms: Feels safe in home. Smoke alarms in place.  Lives w/ wife and dog in 2 story home. No trouble w/ stairs.  Male:   CCS- last 07/21/16. Recall 3 yrs.   Pt states he received letter and will schedule.  PSA-  Lab Results  Component Value Date   PSA 1.74 07/13/2019   PSA 0.80 01/14/2018   PSA 0.82 08/27/2015       Objective:    Vitals: BP 140/78 Comment: pt reported  Pulse 62   Wt 206 lb (93.4 kg)   SpO2 98%   BMI 28.73 kg/m   Body mass index is 28.73 kg/m.  Advanced Directives 10/03/2019 07/01/2019 06/15/2019 09/30/2018 09/08/2018 09/08/2018 08/16/2018  Does Patient Have a Medical Advance Directive? No No No No No No No  Would patient like information on creating a medical advance directive? No - Patient declined - No - Patient declined No - Patient declined No - Patient declined - -  Pre-existing out of facility DNR order (yellow form or pink MOST form) - - - - - - -    Tobacco Social History   Tobacco Use  Smoking Status Former Smoker  . Years: 8.00  . Quit date: 06/24/1971  . Years since quitting: 48.3  Smokeless Tobacco Never Used     Counseling given: Not Answered   Clinical Intake: Pain : No/denies pain     Past Medical History:  Diagnosis Date  . Anginal pain ([place])   . Arthritis   . Barrett's esophagus   . BPH (benign prostatic hypertrophy)   . CAD (coronary artery disease)     a. CABG 1991;  b.  [place];  c.  S/P PTCA  stents to SVG ot OM last in 2003;  d.   Redo CABG x 2 in 2004 (VG->OM->LCX, LRA->PDA);  e. 12/2011 Cath: 3vd, sev LM into LCX dzs, VG->OM 100, LIMA->DIAG->LAD patent, Rad Art->PDA patent, EF 45%, Med Rx.  . Diverticulosis   . DM (diabetes mellitus) ([place])   . Dyslipidemia   . Dyspnea   . Gallstones   . GERD (gastroesophageal reflux disease)   . Gout   . History of hiatal hernia   . HTN (hypertension)   . Hyperlipidemia   . Leukemia in remission ([place])   . Psoriasis   . Rheumatic fever    as a child  . S/P coronary artery stent placement 1998  . TIA (transient ischemic attack)    FOUND ON MRI  PER PATIENT-     NO PROBLEM   Past Surgical History:  Procedure Laterality Date  . APPENDECTOMY    . CHOLECYSTECTOMY N/A 09/18/2016   Procedure: LAPAROSCOPIC CHOLECYSTECTOMY WITH INTRAOPERATIVE CHOLANGIOGRAM;  Surgeon: Jackolyn Confer, MD;  Location: [facility];  Service: General;  Laterality: N/A;  . COLONOSCOPY  07/2013  . CORONARY ANGIOPLASTY     STENTS  . CORONARY ARTERY BYPASS GRAFT     1991 (LIMA to LAD/Diag;  SVG to RCA;  SVG to OM)  . CORONARY ARTERY BYPASS GRAFT  2004 (L radial to PDA; SVG to OM1/ distal LCx)  . TONSILLECTOMY AND ADENOIDECTOMY    . UMBILICAL HERNIA REPAIR     Family History  Problem Relation Age of Onset  . Heart disease Mother   . Hypertension Mother   . Hyperlipidemia Mother   . Diabetes Mother   . Skin cancer Mother        skin  . Heart disease Father   . Hypertension Father   . Hyperlipidemia Father   . Diabetes Father   . Stroke Sister   . Stroke Brother   . Colon cancer Neg Hx    Social History   Socioeconomic History  . Marital status: Married    Spouse name: Not on file  . Number of children: 2  . Years of education: Not on file  . Highest education level: Not on file  Occupational History  . Occupation: retired  Tobacco Use  . Smoking status: Former Smoker    Years: 8.00    Quit date: 06/24/1971    Years since quitting: 48.3  . Smokeless tobacco: Never Used  Substance and  Sexual Activity  . Alcohol use: No  . Drug use: No  . Sexual activity: Yes  Other Topics Concern  . Not on file  Social History Narrative  . Not on file   Social Determinants of Health   Financial Resource Strain: Low Risk   . Difficulty of Paying Living Expenses: Not hard at all  Food Insecurity: No Food Insecurity  . Worried About Charity fundraiser in the Last Year: Never true  . Ran Out of Food in the Last Year: Never true  Transportation Needs: No Transportation Needs  . Lack of Transportation (Medical): No  . Lack of Transportation (Non-Medical): No  Physical Activity:   . Days of Exercise per Week:   . Minutes of Exercise per Session:   Stress:   . Feeling of Stress :   Social Connections:   . Frequency of Communication with Friends and Family:   . Frequency of Social Gatherings with Friends and Family:   . Attends Religious Services:   . Active Member of Clubs or Organizations:   . Attends Archivist Meetings:   Marland Kitchen Marital Status:     Outpatient Encounter Medications as of 10/03/2019  Medication Sig  . acetaminophen (TYLENOL) 325 MG tablet Take 325 mg by mouth every 6 (six) hours as needed for mild pain.  Marland Kitchen allopurinol (ZYLOPRIM) 300 MG tablet Take 1 tablet (300 mg total) by mouth daily.  Marland Kitchen amLODipine (NORVASC) 5 MG tablet Take 5 mg by mouth daily.  Marland Kitchen aspirin EC 81 MG tablet Take 81 mg by mouth daily.  Marland Kitchen atorvastatin (LIPITOR) 80 MG tablet Take 1 tablet (80 mg total) by mouth daily.  . Evolocumab (REPATHA SURECLICK) 366 MG/ML SOAJ Inject 1 pen into the skin every 14 (fourteen) days.  Marland Kitchen ezetimibe (ZETIA) 10 MG tablet Take 1 tablet (10 mg total) by mouth daily.  Marland Kitchen icosapent Ethyl (VASCEPA) 1 g capsule Take 2 capsules (2 g total) by mouth 2 (two) times daily.  . isosorbide mononitrate (IMDUR) 60 MG 24 hr tablet TAKE 1 TABLET EVERY DAY  . loratadine (CLARITIN) 10 MG tablet Take 10 mg by mouth daily as needed for allergies.  . metFORMIN (GLUCOPHAGE) 500 MG  tablet Take 1 tablet (500 mg total) by mouth 3 (three) times daily. (Patient taking differently: Take 500 mg by mouth 2 (two) times daily with a meal. )  .  metoprolol succinate (TOPROL-XL) 25 MG 24 hr tablet Take 1 tablet (25 mg total) by mouth in the morning and at bedtime.  . MULTIPLE VITAMIN-FOLIC ACID PO Take 1 tablet by mouth daily. Contains 502 mg folic acid  . omeprazole (PRILOSEC) 20 MG capsule TAKE 1 CAPSULE TWICE DAILY  . potassium chloride SA (KLOR-CON) 20 MEQ tablet Take 1 tablet (20 mEq total) by mouth every evening.  . triamcinolone cream (KENALOG) 0.1 % Apply 1 application topically 2 (two) times daily. Use as needed for dry or itchy skin  . valsartan-hydrochlorothiazide (DIOVAN-HCT) 160-25 MG tablet TAKE 1 TABLET EVERY DAY  . albuterol (VENTOLIN HFA) 108 (90 Base) MCG/ACT inhaler USE 2 PUFFS INTO THE LUNGS EVERY 6 HOURS AS NEEDED (Patient not taking: Reported on 10/03/2019)  . benzonatate (TESSALON) 100 MG capsule TAKE 1 CAPSULE BY MOUTH THREE TIMES A DAY AS NEEDED FOR COUGH (Patient not taking: Reported on 10/03/2019)  . blood glucose meter kit and supplies Dispense based on patient and insurance preference. Use up to four times daily as directed. (FOR ICD-10 E10.9, E11.9). (Patient not taking: Reported on 10/03/2019)  . Loperamide HCl (IMODIUM PO) Take 1 tablet by mouth daily as needed (loosse stools).  . nitroGLYCERIN (NITROSTAT) 0.4 MG SL tablet Place 1 tablet (0.4 mg total) under the tongue every 5 (five) minutes as needed for chest pain. (Patient not taking: Reported on 10/03/2019)  . OVER THE COUNTER MEDICATION 2 (two) times daily as needed. Magnilife DB-Pain relieving foot cream.   No facility-administered encounter medications on file as of 10/03/2019.    Activities of Daily Living In your present state of health, do you have any difficulty performing the following activities: 10/03/2019  Hearing? N  Vision? N  Difficulty concentrating or making decisions? N  Walking or  climbing stairs? N  Dressing or bathing? N  Doing errands, shopping? N  Preparing Food and eating ? N  Using the Toilet? N  In the past six months, have you accidently leaked urine? N  Do you have problems with loss of bowel control? N  Managing your Medications? N  Managing your Finances? N  Housekeeping or managing your Housekeeping? N  Some recent data might be hidden    Patient Care Team: Copland, Gay Filler, MD as PCP - General (Family Medicine) Fay Records, MD as PCP - Cardiology (Cardiology) Magrinat, Virgie Dad, MD as Consulting Physician (Oncology)   Assessment:   This is a routine wellness examination for Brandon Stone. Physical assessment deferred to PCP.  Exercise Activities and Dietary recommendations Current Exercise Habits: The patient does not participate in regular exercise at present;Home exercise routine, Type of exercise: walking, Time (Minutes): 35, Frequency (Times/Week): 3, Weekly Exercise (Minutes/Week): 105, Exercise limited by: None identified   Diet (meal preparation, eat out, water intake, caffeinated beverages, dairy products, fruits and vegetables): in general, a "healthy" diet  , well balanced Breakfast: oatmeal w/ banana Lunch: pizza Dinner:  Wings, potato.  Goals    . Increase physical activity       Fall Risk Fall Risk  10/03/2019 09/30/2018 09/28/2017 02/05/2017  Falls in the past year? 0 0 No No  Number falls in past yr: 0 - - -  Injury with Fall? 0 - - -  Follow up Education provided;Falls prevention discussed - - -   Depression Screen PHQ 2/9 Scores 10/03/2019 09/30/2018 09/28/2017 02/05/2017  PHQ - 2 Score 0 0 0 0  Exception Documentation - - - Patient refusal    Cognitive Function  Ad8 score reviewed for issues:  Issues making decisions:no  Less interest in hobbies / activities:no  Repeats questions, stories (family complaining):no  Trouble using ordinary gadgets (microwave, computer, phone):no  Forgets the month or year: no  Mismanaging  finances: no  Remembering appts:no Daily problems with thinking and/or memory:no Ad8 score is=0     MMSE - Mini Mental State Exam 09/28/2017  Orientation to time 5  Orientation to Place 5  Registration 3  Attention/ Calculation 5  Recall 3  Language- name 2 objects 2  Language- repeat 1  Language- follow 3 step command 3  Language- read & follow direction 1  Write a sentence 1        Immunization History  Administered Date(s) Administered  . Influenza, High Dose Seasonal PF 05/07/2015, 05/06/2016, 05/11/2017, 04/05/2018  . Pneumococcal Conjugate-13 05/11/2017  . Pneumococcal Polysaccharide-23 12/19/2014  . Tdap 12/19/2016    Screening Tests Health Maintenance  Topic Date Due  . FOOT EXAM  05/25/2019  . HEMOGLOBIN A1C  01/10/2020  . INFLUENZA VACCINE  01/22/2020  . OPHTHALMOLOGY EXAM  04/23/2020  . COLONOSCOPY  07/21/2026  . TETANUS/TDAP  12/20/2026  . Hepatitis C Screening  Completed  . PNA vac Low Risk Adult  Completed       Plan:   See you next year!  Continue to eat heart healthy diet (full of fruits, vegetables, whole grains, lean protein, water--limit salt, fat, and sugar intake) and increase physical activity as tolerated.  Continue doing brain stimulating activities (puzzles, reading, adult coloring books, staying active) to keep memory sharp.   Please schedule colonoscopy as discussed.  I have personally reviewed and noted the following in the patient's chart:   . Medical and social history . Use of alcohol, tobacco or illicit drugs  . Current medications and supplements . Functional ability and status . Nutritional status . Physical activity . Advanced directives . List of other physicians . Hospitalizations, surgeries, and ER visits in previous 12 months . Vitals . Screenings to include cognitive, depression, and falls . Referrals and appointments  In addition, I have reviewed and discussed with patient certain preventive protocols, quality  metrics, and best practice recommendations. A written personalized care plan for preventive services as well as general preventive health recommendations were provided to patient.     Naaman Plummer [place], [place]  10/03/2019

## 2019-10-03 ENCOUNTER — Ambulatory Visit (INDEPENDENT_AMBULATORY_CARE_PROVIDER_SITE_OTHER): Payer: Medicare HMO | Admitting: *Deleted

## 2019-10-03 ENCOUNTER — Encounter: Payer: Self-pay | Admitting: *Deleted

## 2019-10-03 ENCOUNTER — Other Ambulatory Visit: Payer: Self-pay

## 2019-10-03 VITALS — BP 140/78 | HR 62 | Wt 206.0 lb

## 2019-10-03 DIAGNOSIS — Z0000 Encounter for general adult medical examination without abnormal findings: Secondary | ICD-10-CM | POA: Diagnosis not present

## 2019-10-03 NOTE — Patient Instructions (Signed)
See you next year!  Continue to eat heart healthy diet (full of fruits, vegetables, whole grains, lean protein, water--limit salt, fat, and sugar intake) and increase physical activity as tolerated.  Continue doing brain stimulating activities (puzzles, reading, adult coloring books, staying active) to keep memory sharp.   Please schedule colonoscopy as discussed.   Brandon Stone , Thank you for taking time to come for your Medicare Wellness Visit. I appreciate your ongoing commitment to your health goals. Please review the following plan we discussed and let me know if I can assist you in the future.   These are the goals we discussed: Goals    . Increase physical activity       This is a list of the screening recommended for you and due dates:  Health Maintenance  Topic Date Due  . Complete foot exam   05/25/2019  . Hemoglobin A1C  01/10/2020  . Flu Shot  01/22/2020  . Eye exam for diabetics  04/23/2020  . Colon Cancer Screening  07/21/2026  . Tetanus Vaccine  12/20/2026  .  Hepatitis C: One time screening is recommended by Center for Disease Control  (CDC) for  adults born from [birth year] through 1963.   Completed  . Pneumonia vaccines  Completed    Preventive Care 33 Years and Older, Male Preventive care refers to lifestyle choices and visits with your health care provider that can promote health and wellness. This includes:  A yearly physical exam. This is also called an annual well check.  Regular dental and eye exams.  Immunizations.  Screening for certain conditions.  Healthy lifestyle choices, such as diet and exercise. What can I expect for my preventive care visit? Physical exam Your health care provider will check:  Height and weight. These may be used to calculate body mass index (BMI), which is a measurement that tells if you are at a healthy weight.  Heart rate and blood pressure.  Your skin for abnormal spots. Counseling Your health care provider may ask you  questions about:  Alcohol, tobacco, and drug use.  Emotional well-being.  Home and relationship well-being.  Sexual activity.  Eating habits.  History of falls.  Memory and ability to understand (cognition).  Work and work Statistician. What immunizations do I need?  Influenza (flu) vaccine  This is recommended every year. Tetanus, diphtheria, and pertussis (Tdap) vaccine  You may need a Td booster every 10 years. Varicella (chickenpox) vaccine  You may need this vaccine if you have not already been vaccinated. Zoster (shingles) vaccine  You may need this after age 83. Pneumococcal conjugate (PCV13) vaccine  One dose is recommended after age 35. Pneumococcal polysaccharide (PPSV23) vaccine  One dose is recommended after age 102. Measles, mumps, and rubella (MMR) vaccine  You may need at least one dose of MMR if you were born in 1957 or later. You may also need a second dose. Meningococcal conjugate (MenACWY) vaccine  You may need this if you have certain conditions. Hepatitis A vaccine  You may need this if you have certain conditions or if you travel or work in places where you may be exposed to hepatitis A. Hepatitis B vaccine  You may need this if you have certain conditions or if you travel or work in places where you may be exposed to hepatitis B. Haemophilus influenzae type b (Hib) vaccine  You may need this if you have certain conditions. You may receive vaccines as individual doses or as more than one vaccine  together in one shot (combination vaccines). Talk with your health care provider about the risks and benefits of combination vaccines. What tests do I need? Blood tests  Lipid and cholesterol levels. These may be checked every 5 years, or more frequently depending on your overall health.  Hepatitis C test.  Hepatitis B test. Screening  Lung cancer screening. You may have this screening every year starting at age 43 if you have a 30-pack-year  history of smoking and currently smoke or have quit within the past 15 years.  Colorectal cancer screening. All adults should have this screening starting at age 34 and continuing until age 51. Your health care provider may recommend screening at age 55 if you are at increased risk. You will have tests every 1-10 years, depending on your results and the type of screening test.  Prostate cancer screening. Recommendations will vary depending on your family history and other risks.  Diabetes screening. This is done by checking your blood sugar (glucose) after you have not eaten for a while (fasting). You may have this done every 1-3 years.  Abdominal aortic aneurysm (AAA) screening. You may need this if you are a current or former smoker.  Sexually transmitted disease (STD) testing. Follow these instructions at home: Eating and drinking  Eat a diet that includes fresh fruits and vegetables, whole grains, lean protein, and low-fat dairy products. Limit your intake of foods with high amounts of sugar, saturated fats, and salt.  Take vitamin and mineral supplements as recommended by your health care provider.  Do not drink alcohol if your health care provider tells you not to drink.  If you drink alcohol: ? Limit how much you have to 0-2 drinks a day. ? Be aware of how much alcohol is in your drink. In the [place] one drink equals one 12 oz bottle of beer (355 mL), one 5 oz glass of wine (148 mL), or one 1 oz glass of hard liquor (44 mL). Lifestyle  Take daily care of your teeth and gums.  Stay active. Exercise for at least 30 minutes on 5 or more days each week.  Do not use any products that contain nicotine or tobacco, such as cigarettes, e-cigarettes, and chewing tobacco. If you need help quitting, ask your health care provider.  If you are sexually active, practice safe sex. Use a condom or other form of protection to prevent STIs (sexually transmitted infections).  Talk with your  health care provider about taking a low-dose aspirin or statin. What's next?  Visit your health care provider once a year for a well check visit.  Ask your health care provider how often you should have your eyes and teeth checked.  Stay up to date on all vaccines. This information is not intended to replace advice given to you by your health care provider. Make sure you discuss any questions you have with your health care provider. Document Revised: 06/03/2018 Document Reviewed: 06/03/2018 Elsevier Patient Education  2020 [employer].

## 2019-10-17 ENCOUNTER — Encounter: Payer: Self-pay | Admitting: Family Medicine

## 2019-10-18 NOTE — Progress Notes (Addendum)
[facility] [address], [place], [place] [address] [phone]  Date:  10/19/2019   Name:  Brandon Stone   DOB:  11-Sep-1945   MRN:  [id number]  PCP:  Darreld Mclean, MD    Chief Complaint: Shoulder Pain (Left shoulder, 2 months, possibly hurt while carrying heavy object) and Medication Management (omeprazole change to something else? )   History of Present Illness:  Brandon Stone is a 74 y.o. very pleasant male patient who presents with the following:  Patient with history of diabetes, CAD, CLL, hypertension, hyperlipidemia, subdural hematoma in March 2020 with no sequelae Last by myself in January He had COVID-19 last December as well Here today with concern of shoulder pain  He was recently seen in lipid clinic-they started him on Repatha He says cardiologist, Dr. Harrington Challenger last month.  At that time his CAD which seems stable, blood pressure under okay control Dr. Marin Olp manages his CLL, last visit in December Lab Results  Component Value Date   HGBA1C 7.9 (H) 07/13/2019   Foot exam is due today COVID-19 vaccine; not yet done, encouraged him to do this asap  Can repeat A1c- will do today.  He is not fasting  Shingrix He is a former smoker, offer AAA screening- he would like to do this   He is taking metformin BID instead of tid  He has been carrying his elderly dog- she is about 35 lbs- and strained his left shoulder The pain is over his deltoid It has been aching for about 2 months- no particular discrete injury noted He will have some pain at night if he rolls onto the shoulder Certain movements bother him- such as reaching behind himself or putting on a shirt or jacket He will very occasionally have quite severe sharp pain, but typically it is more of a dull ache No weakness or numbness of the arm Range of motion is okay He notes that as long as he holds the arm steady at his side, he has no pain No CP  or SOB   Patient Active Problem List   Diagnosis Date Noted  . Subdural hematoma ([place]) 09/08/2018  . Diabetes mellitus without complication ([place]) 91/69/4503  . Symptomatic cholelithiasis 09/18/2016  . Chronic lymphocytic leukemia (CLL), B-cell ([place]) 05/13/2014  . Stable angina ([place]) 01/26/2012  . Sore throat 11/03/2010  . Hyperlipidemia 06/21/2008  . HYPERTENSION, BENIGN 06/21/2008  . CAD, NATIVE VESSEL 06/21/2008    Past Medical History:  Diagnosis Date  . Anginal pain ([place])   . Arthritis   . Barrett's esophagus   . BPH (benign prostatic hypertrophy)   . CAD (coronary artery disease)     a. CABG 1991;  b.  [facility];  c.  S/P PTCA  stents to SVG ot OM last in 2003;  d.  Redo CABG x 2 in 2004 (VG->OM->LCX, LRA->PDA);  e. 12/2011 Cath: 3vd, sev LM into LCX dzs, VG->OM 100, LIMA->DIAG->LAD patent, Rad Art->PDA patent, EF 45%, Med Rx.  . Diverticulosis   . DM (diabetes mellitus) ([place])   . Dyslipidemia   . Dyspnea   . Gallstones   . GERD (gastroesophageal reflux disease)   . Gout   . History of hiatal hernia   . HTN (hypertension)   . Hyperlipidemia   . Leukemia in remission ([place])   . Psoriasis   . Rheumatic fever    as a child  . S/P coronary artery  stent placement 1998  . TIA (transient ischemic attack)    FOUND ON MRI  PER PATIENT-     NO PROBLEM    Past Surgical History:  Procedure Laterality Date  . APPENDECTOMY    . CHOLECYSTECTOMY N/A 09/18/2016   Procedure: LAPAROSCOPIC CHOLECYSTECTOMY WITH INTRAOPERATIVE CHOLANGIOGRAM;  Surgeon: Jackolyn Confer, MD;  Location: [facility];  Service: General;  Laterality: N/A;  . COLONOSCOPY  07/2013  . CORONARY ANGIOPLASTY     STENTS  . CORONARY ARTERY BYPASS GRAFT     1991 (LIMA to LAD/Diag;  SVG to RCA;  SVG to OM)  . CORONARY ARTERY BYPASS GRAFT     2004 (L radial to PDA; SVG to OM1/ distal LCx)  . TONSILLECTOMY AND ADENOIDECTOMY    . UMBILICAL HERNIA REPAIR      Social History   Tobacco Use  . Smoking status: Former  Smoker    Years: 8.00    Quit date: 06/24/1971    Years since quitting: 48.3  . Smokeless tobacco: Never Used  Substance Use Topics  . Alcohol use: No  . Drug use: No    Family History  Problem Relation Age of Onset  . Heart disease Mother   . Hypertension Mother   . Hyperlipidemia Mother   . Diabetes Mother   . Skin cancer Mother        skin  . Heart disease Father   . Hypertension Father   . Hyperlipidemia Father   . Diabetes Father   . Stroke Sister   . Stroke Brother   . Colon cancer Neg Hx     Allergies  Allergen Reactions  . Other Hives    Intolerance to strong pain medications  Rash to opsite and tegaderm tape.  . Rosuvastatin     Nausea, abdominal discomfort  . Ticlopidine Hcl Hives and Itching  . Black Pepper [[place]] Palpitations    HEART PALPITATIONS HEART PALPITATIONS  . Codeine Palpitations and Other (See Comments)    Wild dreams, palpitations "wild dreams"    Medication list has been reviewed and updated.  Current Outpatient Medications on File Prior to Visit  Medication Sig Dispense Refill  . acetaminophen (TYLENOL) 325 MG tablet Take 325 mg by mouth every 6 (six) hours as needed for mild pain.    Marland Kitchen allopurinol (ZYLOPRIM) 300 MG tablet Take 1 tablet (300 mg total) by mouth daily. 90 tablet 3  . amLODipine (NORVASC) 5 MG tablet Take 5 mg by mouth daily.    Marland Kitchen aspirin EC 81 MG tablet Take 81 mg by mouth daily.    Marland Kitchen atorvastatin (LIPITOR) 80 MG tablet Take 1 tablet (80 mg total) by mouth daily. 90 tablet 1  . blood glucose meter kit and supplies Dispense based on patient and insurance preference. Use up to four times daily as directed. (FOR ICD-10 E10.9, E11.9). 1 each 0  . Evolocumab (REPATHA SURECLICK) 419 MG/ML SOAJ Inject 1 pen into the skin every 14 (fourteen) days. 6 pen 3  . ezetimibe (ZETIA) 10 MG tablet Take 1 tablet (10 mg total) by mouth daily. 90 tablet 3  . icosapent Ethyl (VASCEPA) 1 g capsule Take 2 capsules (2 g total) by mouth 2 (two)  times daily. 360 capsule 3  . isosorbide mononitrate (IMDUR) 60 MG 24 hr tablet TAKE 1 TABLET EVERY DAY 90 tablet 3  . Loperamide HCl (IMODIUM PO) Take 1 tablet by mouth daily as needed (loosse stools).    . loratadine (CLARITIN) 10 MG tablet Take 10  mg by mouth daily as needed for allergies.    . metFORMIN (GLUCOPHAGE) 500 MG tablet Take 1 tablet (500 mg total) by mouth 3 (three) times daily. (Patient taking differently: Take 500 mg by mouth 2 (two) times daily with a meal. ) 270 tablet 3  . metoprolol succinate (TOPROL-XL) 25 MG 24 hr tablet Take 1 tablet (25 mg total) by mouth in the morning and at bedtime. 180 tablet 3  . MULTIPLE VITAMIN-FOLIC ACID PO Take 1 tablet by mouth daily. Contains 563 mg folic acid    . nitroGLYCERIN (NITROSTAT) 0.4 MG SL tablet Place 1 tablet (0.4 mg total) under the tongue every 5 (five) minutes as needed for chest pain. 25 tablet 3  . omeprazole (PRILOSEC) 20 MG capsule TAKE 1 CAPSULE TWICE DAILY 180 capsule 3  . OVER THE COUNTER MEDICATION 2 (two) times daily as needed. Magnilife DB-Pain relieving foot cream.    . potassium chloride SA (KLOR-CON) 20 MEQ tablet Take 1 tablet (20 mEq total) by mouth every evening. 90 tablet 3  . triamcinolone cream (KENALOG) 0.1 % Apply 1 application topically 2 (two) times daily. Use as needed for dry or itchy skin 30 g 2  . valsartan-hydrochlorothiazide (DIOVAN-HCT) 160-25 MG tablet TAKE 1 TABLET EVERY DAY 90 tablet 3   No current facility-administered medications on file prior to visit.    Review of Systems:  As per HPI- otherwise negative.   Physical Examination: Vitals:   10/19/19 0821 10/19/19 0847  BP: (!) 160/88 132/80  Pulse: 64   Resp: 18   SpO2: 98%    Vitals:   10/19/19 0821  Weight: 215 lb (97.5 kg)  Height: '5\' 11"'  (1.803 m)   Body mass index is 29.99 kg/m. Ideal Body Weight: Weight in (lb) to have BMI = 25: 178.9  GEN: no acute distress.  Overweight, otherwise looks well HEENT: Atraumatic,  Normocephalic.   Bilateral TM wnl, oropharynx normal.  PEERL,EOMI.   Ears and Nose: No external deformity. CV: RRR, No M/G/R. No JVD. No thrill. No extra heart sounds. PULM: CTA B, no wheezes, crackles, rhonchi. No retractions. No resp. distress. No accessory muscle use. EXTR: No c/c/e PSYCH: Normally interactive. Conversant.  Foot exam- normal today  Left shoulder displays normal range of motion.  He is slightly tender at the inferior border of the scapula, otherwise no tenderness.  He has mild discomfort with range of motion testing, particularly internal and external rotation.  Negative impingement testing today.  Strength is normal  Assessment and Plan: Left shoulder tendinitis  Essential hypertension  Diabetes mellitus without complication ([place]) - Plan: Hemoglobin A1c  Screening for AAA (abdominal aortic aneurysm) - Plan: US AORTA MEDICARE SCREENING  Former smoker - Plan: US AORTA MEDICARE SCREENING  Here today with concern of left shoulder tendinitis.  I have given him a handout and instructions regarding shoulder stretches and exercises.  I have asked him to work on these for 10 to 15 minutes a day, 5 days a week.  He will let me know if not resolved in 3 to 4 weeks, sooner if getting worse  Blood pressure under okay control  A1c pending  Ordered AAA screening  Encourage Covid vaccine Moderate medical decision making today This visit occurred during the SARS-CoV-2 public health emergency.  Safety protocols were in place, including screening questions prior to the visit, additional usage of staff PPE, and extensive cleaning of exam room while observing appropriate contact time as indicated for disinfecting solutions.  Signed Lamar Blinks, MD Received his A1c as follows, message to patient  Results for orders placed or performed in visit on 10/19/19  Hemoglobin A1c  Result Value Ref Range   Hgb A1c MFr Bld 7.6 (H) 4.6 - 6.5 %

## 2019-10-18 NOTE — Patient Instructions (Addendum)
It was great to see you again today Please consider getting your COVID-19 vaccine and Shingrix vaccine if not done already- both of these vaccinations are safe and effective!    Please work on your shoulder exercises for the next 3-4 weeks, let me know if not better (sooner if getting worse)- in that case we can have you see sports med  I will set you up for a AAA screening ultrasound   You can try an H2 blocker instead of your omeprazole- something like OTC cimetidine (Tagamet HB) or famotidine (Calmicid, Fluxid, Pepcid AC) If an H2 blocker controls your GERD symptoms it would be more ideal to use it instead of omeprazole- however it is acceptable to stay on omeprazole if needed to control your GERD

## 2019-10-19 ENCOUNTER — Ambulatory Visit (INDEPENDENT_AMBULATORY_CARE_PROVIDER_SITE_OTHER): Payer: Medicare HMO | Admitting: Family Medicine

## 2019-10-19 ENCOUNTER — Encounter: Payer: Self-pay | Admitting: Family Medicine

## 2019-10-19 ENCOUNTER — Other Ambulatory Visit: Payer: Self-pay

## 2019-10-19 VITALS — BP 132/80 | HR 64 | Resp 18 | Ht 71.0 in | Wt 215.0 lb

## 2019-10-19 DIAGNOSIS — I10 Essential (primary) hypertension: Secondary | ICD-10-CM

## 2019-10-19 DIAGNOSIS — E119 Type 2 diabetes mellitus without complications: Secondary | ICD-10-CM | POA: Diagnosis not present

## 2019-10-19 DIAGNOSIS — Z136 Encounter for screening for cardiovascular disorders: Secondary | ICD-10-CM | POA: Diagnosis not present

## 2019-10-19 DIAGNOSIS — Z87891 Personal history of nicotine dependence: Secondary | ICD-10-CM | POA: Diagnosis not present

## 2019-10-19 DIAGNOSIS — M778 Other enthesopathies, not elsewhere classified: Secondary | ICD-10-CM

## 2019-10-19 DIAGNOSIS — M7582 Other shoulder lesions, left shoulder: Secondary | ICD-10-CM

## 2019-10-19 LAB — HEMOGLOBIN A1C: Hgb A1c MFr Bld: 7.6 % — ABNORMAL HIGH (ref 4.6–6.5)

## 2019-10-31 ENCOUNTER — Other Ambulatory Visit: Payer: Medicare HMO | Admitting: *Deleted

## 2019-10-31 ENCOUNTER — Other Ambulatory Visit: Payer: Self-pay | Admitting: Family Medicine

## 2019-10-31 ENCOUNTER — Other Ambulatory Visit: Payer: Self-pay

## 2019-10-31 ENCOUNTER — Encounter: Payer: Self-pay | Admitting: Family Medicine

## 2019-10-31 ENCOUNTER — Ambulatory Visit
Admission: RE | Admit: 2019-10-31 | Discharge: 2019-10-31 | Disposition: A | Discharge status: Home / Self Care | Payer: Medicare HMO | Source: Ambulatory Visit | Attending: Family Medicine | Admitting: Family Medicine

## 2019-10-31 DIAGNOSIS — Z136 Encounter for screening for cardiovascular disorders: Secondary | ICD-10-CM

## 2019-10-31 DIAGNOSIS — E785 Hyperlipidemia, unspecified: Secondary | ICD-10-CM

## 2019-10-31 DIAGNOSIS — Z87891 Personal history of nicotine dependence: Secondary | ICD-10-CM

## 2019-10-31 DIAGNOSIS — I714 Abdominal aortic aneurysm, without rupture, unspecified: Secondary | ICD-10-CM | POA: Insufficient documentation

## 2019-10-31 LAB — HEPATIC FUNCTION PANEL
ALT: 21 IU/L (ref 0–44)
AST: 19 IU/L (ref 0–40)
Albumin: 4.5 g/dL (ref 3.7–4.7)
Alkaline Phosphatase: 80 IU/L (ref 39–117)
Bilirubin Total: 0.8 mg/dL (ref 0.0–1.2)
Bilirubin, Direct: 0.3 mg/dL (ref 0.00–0.40)
Total Protein: 6.4 g/dL (ref 6.0–8.5)

## 2019-10-31 LAB — LIPID PANEL
Chol/HDL Ratio: 1.6 ratio (ref 0.0–5.0)
Cholesterol, Total: 65 mg/dL — ABNORMAL LOW (ref 100–199)
HDL: 41 mg/dL (ref >39)
LDL Chol Calc (NIH): 5 mg/dL (ref 0–99)
Triglycerides: 101 mg/dL (ref 0–149)
VLDL Cholesterol Cal: 19 mg/dL (ref 5–40)

## 2019-11-01 ENCOUNTER — Telehealth: Payer: Self-pay | Admitting: Pharmacist

## 2019-11-01 NOTE — Telephone Encounter (Addendum)
Lipids look great. LDL down to 5. Patient may stop taking zetia. Informed patient of the above. He is in agreement to stop zetia.

## 2019-11-06 ENCOUNTER — Other Ambulatory Visit: Payer: Self-pay | Admitting: Internal Medicine

## 2019-11-06 DIAGNOSIS — I10 Essential (primary) hypertension: Secondary | ICD-10-CM

## 2020-01-24 ENCOUNTER — Telehealth: Payer: Self-pay | Admitting: Pharmacist

## 2020-01-24 ENCOUNTER — Other Ambulatory Visit: Payer: Medicare HMO

## 2020-01-24 ENCOUNTER — Other Ambulatory Visit: Payer: Self-pay

## 2020-01-24 DIAGNOSIS — E785 Hyperlipidemia, unspecified: Secondary | ICD-10-CM

## 2020-01-24 LAB — LIPID PANEL
Chol/HDL Ratio: 1.8 ratio (ref 0.0–5.0)
Cholesterol, Total: 68 mg/dL — ABNORMAL LOW (ref 100–199)
HDL: 37 mg/dL — ABNORMAL LOW (ref >39)
LDL Chol Calc (NIH): 9 mg/dL (ref 0–99)
Triglycerides: 127 mg/dL (ref 0–149)
VLDL Cholesterol Cal: 22 mg/dL (ref 5–40)

## 2020-01-24 NOTE — Telephone Encounter (Signed)
Called pt to schedule labs since he stopped Zetia a few months ago due to LDL of 5. He will come in this AM since he has not had anything to eat yet today.

## 2020-01-25 ENCOUNTER — Telehealth: Payer: Self-pay | Admitting: Pharmacist

## 2020-01-25 MED ORDER — ATORVASTATIN CALCIUM 40 MG PO TABS
40.0000 mg | ORAL_TABLET | Freq: Every day | ORAL | 3 refills | Status: DC
Start: 2020-01-25 — End: 2020-05-04

## 2020-01-25 NOTE — Addendum Note (Signed)
Addended by: Rodman Key on: 01/25/2020 12:49 PM   Modules accepted: Orders

## 2020-01-25 NOTE — Telephone Encounter (Addendum)
LDL now 9 off of zetia. There has been no long term adverse effects seen with LDL <25. I think it is safe to continue current therapy. Could consider decreasing atorvastatin to 40mg  if patient really desired. I spoke with patient who is comfortable continuing current regimen of atorvastatin 80mg  daily, Repatha 140mg  q 14 days and Vascepa 2g BID. Check lipids yearly.

## 2020-01-25 NOTE — Telephone Encounter (Signed)
Spoke with the patient. He will start cutting atorvastatin 80 mg in 1/2 to take 40 mg daily. Has a large supply. Will call when gets low to request lower dose of 40 mg be sent to [facility].  Scheduled his Dec f/u with Dr. Harrington Challenger.

## 2020-01-25 NOTE — Telephone Encounter (Signed)
Agree with Melissa  Would recomm cutting back atovastatin to 40 mg

## 2020-01-26 ENCOUNTER — Telehealth: Payer: Self-pay

## 2020-01-26 DIAGNOSIS — E785 Hyperlipidemia, unspecified: Secondary | ICD-10-CM

## 2020-01-26 NOTE — Telephone Encounter (Signed)
-----   Message from Ramond Dial, [place] sent at 01/24/2020  7:40 AM EDT -----  ----- Message ----- From: Ramond Dial, RPH-CPP Sent: 01/24/2020 To: Megan E Supple, RPH-CPP, #  Set up repeat lipid panel- stopped zetia due to LDL of 5 ----- Message ----- From: Leeroy Bock, RPH-CPP Sent: 11/01/2019 To: Megan E Supple, RPH  lipids

## 2020-01-26 NOTE — Telephone Encounter (Signed)
lmomed the pt to call back to scheduled lipid labs fasting, orders placed, will await call back from patient

## 2020-02-06 ENCOUNTER — Other Ambulatory Visit: Payer: Self-pay

## 2020-02-06 NOTE — Patient Outreach (Signed)
[facility]) Care Management  02/06/2020  Graeden Bitner 11-23-1945 [id number]   Referral Date: 02/01/20 Referral Source: EMMI Referral Reason: Join EMMI   Outreach Attempt: spoke with patient. Discussed reason for call and [facility] ongoing services and support for management of health.  Patient acknowledges calls and letter.  However, patient declines services at this time.     Plan: RN CM will close case.     Jone Baseman, RN, MSN [facility] Care Management Care Management Coordinator Direct Line [phone] Toll Free: [phone]  Fax: [phone]

## 2020-03-12 ENCOUNTER — Encounter: Payer: Self-pay | Admitting: Family Medicine

## 2020-03-12 ENCOUNTER — Telehealth (INDEPENDENT_AMBULATORY_CARE_PROVIDER_SITE_OTHER): Payer: Medicare HMO | Admitting: Family Medicine

## 2020-03-12 ENCOUNTER — Other Ambulatory Visit (INDEPENDENT_AMBULATORY_CARE_PROVIDER_SITE_OTHER): Payer: Medicare HMO

## 2020-03-12 ENCOUNTER — Other Ambulatory Visit: Payer: Self-pay

## 2020-03-12 VITALS — BP 118/70 | HR 94

## 2020-03-12 DIAGNOSIS — R05 Cough: Secondary | ICD-10-CM

## 2020-03-12 DIAGNOSIS — J069 Acute upper respiratory infection, unspecified: Secondary | ICD-10-CM

## 2020-03-12 DIAGNOSIS — R059 Cough, unspecified: Secondary | ICD-10-CM

## 2020-03-12 DIAGNOSIS — J029 Acute pharyngitis, unspecified: Secondary | ICD-10-CM | POA: Diagnosis not present

## 2020-03-12 LAB — POCT RAPID STREP A (OFFICE): Rapid Strep A Screen: NEGATIVE

## 2020-03-12 MED ORDER — DOXYCYCLINE HYCLATE 100 MG PO TABS: 100.0000 mg | ORAL_TABLET | Freq: Two times a day (BID) | ORAL | 0 refills | Status: DC

## 2020-03-12 MED ORDER — PROMETHAZINE-DM 6.25-15 MG/5ML PO SYRP: 5.0000 mL | ORAL_SOLUTION | Freq: Four times a day (QID) | ORAL | 0 refills | Status: DC | PRN

## 2020-03-12 NOTE — Progress Notes (Signed)
Patient ID: Brandon Stone, male    DOB: 1945-10-30  Age: 74 y.o. MRN: [id number]      Subjective:  Subjective  HPI Brandon Stone presents for ov in his car.  He c/o congestion , sore throat and cough x over 2 weeks    He has had covid and did have both vaccines    This was not a virtual visit ---- it was in person in parking lot    Review of Systems  Constitutional: Negative for chills and fever.  HENT: Positive for congestion, postnasal drip, rhinorrhea, sinus pressure and sore throat. Negative for sinus pain.   Respiratory: Positive for cough. Negative for chest tightness, shortness of breath and wheezing.   Cardiovascular: Negative for chest pain, palpitations and leg swelling.  Allergic/Immunologic: Negative for environmental allergies.    History Past Medical History:  Diagnosis Date  . Anginal pain ([place])   . Arthritis   . Barrett's esophagus   . BPH (benign prostatic hypertrophy)   . CAD (coronary artery disease)     a. CABG 1991;  b.  [place];  c.  S/P PTCA  stents to SVG ot OM last in 2003;  d.  Redo CABG x 2 in 2004 (VG->OM->LCX, LRA->PDA);  e. 12/2011 Cath: 3vd, sev LM into LCX dzs, VG->OM 100, LIMA->DIAG->LAD patent, Rad Art->PDA patent, EF 45%, Med Rx.  . Diverticulosis   . DM (diabetes mellitus) ([place])   . Dyslipidemia   . Dyspnea   . Gallstones   . GERD (gastroesophageal reflux disease)   . Gout   . History of hiatal hernia   . HTN (hypertension)   . Hyperlipidemia   . Leukemia in remission ([place])   . Psoriasis   . Rheumatic fever    as a child  . S/P coronary artery stent placement 1998  . TIA (transient ischemic attack)    FOUND ON MRI  PER PATIENT-     NO PROBLEM    He has a past surgical history that includes Coronary artery bypass graft; Coronary artery bypass graft; Appendectomy; Umbilical hernia repair; Tonsillectomy and adenoidectomy; Colonoscopy (07/2013); Coronary angioplasty; and Cholecystectomy (N/A, 09/18/2016).   His family history includes  Diabetes in his father and mother; Heart disease in his father and mother; Hyperlipidemia in his father and mother; Hypertension in his father and mother; Skin cancer in his mother; Stroke in his brother and sister.He reports that he quit smoking about 48 years ago. He quit after 8.00 years of use. He has never used smokeless tobacco. He reports that he does not drink alcohol and does not use drugs.  Current Outpatient Medications on File Prior to Visit  Medication Sig Dispense Refill  . acetaminophen (TYLENOL) 325 MG tablet Take 325 mg by mouth every 6 (six) hours as needed for mild pain.    Marland Kitchen allopurinol (ZYLOPRIM) 300 MG tablet Take 1 tablet (300 mg total) by mouth daily. 90 tablet 3  . amLODipine (NORVASC) 5 MG tablet TAKE 1 TABLET EVERY DAY ( DOSE DECREASE ) 90 tablet 2  . aspirin EC 81 MG tablet Take 81 mg by mouth daily.    Marland Kitchen atorvastatin (LIPITOR) 40 MG tablet Take 1 tablet (40 mg total) by mouth daily. 90 tablet 3  . blood glucose meter kit and supplies Dispense based on patient and insurance preference. Use up to four times daily as directed. (FOR ICD-10 E10.9, E11.9). 1 each 0  . Evolocumab (REPATHA SURECLICK) 536 MG/ML SOAJ Inject 1 pen into the skin  every 14 (fourteen) days. 6 pen 3  . icosapent Ethyl (VASCEPA) 1 g capsule Take 2 capsules (2 g total) by mouth 2 (two) times daily. 360 capsule 3  . isosorbide mononitrate (IMDUR) 60 MG 24 hr tablet TAKE 1 TABLET EVERY DAY 90 tablet 3  . Loperamide HCl (IMODIUM PO) Take 1 tablet by mouth daily as needed (loosse stools).    . loratadine (CLARITIN) 10 MG tablet Take 10 mg by mouth daily as needed for allergies.    . metFORMIN (GLUCOPHAGE) 500 MG tablet Take 1 tablet (500 mg total) by mouth 3 (three) times daily. (Patient taking differently: Take 500 mg by mouth 2 (two) times daily with a meal. ) 270 tablet 3  . metoprolol succinate (TOPROL-XL) 25 MG 24 hr tablet Take 1 tablet (25 mg total) by mouth in the morning and at bedtime. 180 tablet 3   . MULTIPLE VITAMIN-FOLIC ACID PO Take 1 tablet by mouth daily. Contains 876 mg folic acid    . nitroGLYCERIN (NITROSTAT) 0.4 MG SL tablet Place 1 tablet (0.4 mg total) under the tongue every 5 (five) minutes as needed for chest pain. 25 tablet 3  . omeprazole (PRILOSEC) 20 MG capsule TAKE 1 CAPSULE TWICE DAILY 180 capsule 3  . OVER THE COUNTER MEDICATION 2 (two) times daily as needed. Magnilife DB-Pain relieving foot cream.    . potassium chloride SA (KLOR-CON) 20 MEQ tablet Take 1 tablet (20 mEq total) by mouth every evening. 90 tablet 3  . triamcinolone cream (KENALOG) 0.1 % Apply 1 application topically 2 (two) times daily. Use as needed for dry or itchy skin 30 g 2  . valsartan-hydrochlorothiazide (DIOVAN-HCT) 160-25 MG tablet TAKE 1 TABLET EVERY DAY 90 tablet 3   No current facility-administered medications on file prior to visit.     Objective:  Objective  Physical Exam Vitals and nursing note reviewed.  Constitutional:      General: He is sleeping.     Appearance: He is well-developed.  HENT:     Head: Normocephalic and atraumatic.  Cardiovascular:     Rate and Rhythm: Normal rate and regular rhythm.     Heart sounds: No murmur heard.   Pulmonary:     Effort: Pulmonary effort is normal. No respiratory distress.     Breath sounds: Normal breath sounds. No wheezing or rales.  Chest:     Chest wall: No tenderness.  Musculoskeletal:        General: No tenderness.  Skin:    General: Skin is warm and dry.  Neurological:     Mental Status: He is oriented to person, place, and time.  Psychiatric:        Behavior: Behavior normal.        Thought Content: Thought content normal.        Judgment: Judgment normal.    BP 118/70   Pulse 94   SpO2 96%  Wt Readings from Last 3 Encounters:  10/19/19 215 lb (97.5 kg)  10/03/19 206 lb (93.4 kg)  08/22/19 211 lb 12.8 oz (96.1 kg)     Lab Results  Component Value Date   WBC 10.1 07/01/2019   HGB 14.0 07/01/2019   HCT 40.7  07/01/2019   PLT 331 07/01/2019   GLUCOSE 143 (H) 07/13/2019   CHOL 68 (L) 01/24/2020   TRIG 127 01/24/2020   HDL 37 (L) 01/24/2020   LDLDIRECT 82.0 07/13/2019   LDLCALC 9 01/24/2020   ALT 21 10/31/2019   AST 19 10/31/2019  NA 140 07/13/2019   K 3.8 07/13/2019   CL 101 07/13/2019   CREATININE 0.96 07/13/2019   BUN 16 07/13/2019   CO2 30 07/13/2019   TSH 2.02 01/14/2018   PSA 1.74 07/13/2019   INR 0.97 12/10/2017   HGBA1C 7.6 (H) 10/19/2019    US AORTA MEDICARE SCREENING  Result Date: 10/31/2019 CLINICAL DATA:  Male between 20-41 years of age with a smoking history. EXAM: US ABDOMINAL AORTA MEDICARE SCREENING TECHNIQUE: Ultrasound examination of the abdominal aorta was performed as a screening evaluation for abdominal aortic aneurysm. COMPARISON:  None. 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 Recommend followup by ultrasound in 3 years. This recommendation follows ACR consensus guidelines: White Paper of the ACR Incidental Findings Committee II on Vascular Findings. Natasha Mead Coll Radiol 2013; 10:789-794 Electronically Signed   By: Marlaine Hind M.D.   On: 10/31/2019 11:15     Assessment & Plan:  Plan  I am having Brandon Stone start on doxycycline and promethazine-dextromethorphan. I am also having him maintain his MULTIPLE VITAMIN-FOLIC ACID PO, Loperamide HCl (IMODIUM PO), acetaminophen, loratadine, triamcinolone cream, nitroGLYCERIN, blood glucose meter kit and supplies, allopurinol, OVER THE COUNTER MEDICATION, potassium chloride SA, metFORMIN, isosorbide mononitrate, valsartan-hydrochlorothiazide, Repatha SureClick, icosapent Ethyl, aspirin EC, omeprazole, metoprolol succinate, amLODipine, and atorvastatin.  Meds ordered this encounter  Medications  . doxycycline (VIBRA-TABS) 100 MG tablet    Sig: Take 1 tablet (100 mg total) by mouth 2 (two) times daily.    Dispense:  20  tablet    Refill:  0  . promethazine-dextromethorphan (PROMETHAZINE-DM) 6.25-15 MG/5ML syrup    Sig: Take 5 mLs by mouth 4 (four) times daily as needed.    Dispense:  118 mL    Refill:  0    Problem List Items Addressed This Visit    None    Visit Diagnoses    Viral upper respiratory tract infection    -  Primary   Relevant Orders   Novel Coronavirus, NAA ([facility])   Cough       Relevant Medications   doxycycline (VIBRA-TABS) 100 MG tablet   promethazine-dextromethorphan (PROMETHAZINE-DM) 6.25-15 MG/5ML syrup   Other Relevant Orders   Novel Coronavirus, NAA ([facility])      Follow-up: No follow-ups on file.  Ann Held, DO

## 2020-03-14 LAB — SARS-COV-2, NAA 2 DAY TAT

## 2020-03-14 LAB — NOVEL CORONAVIRUS, NAA: SARS-CoV-2, NAA: NOT DETECTED

## 2020-04-17 ENCOUNTER — Other Ambulatory Visit: Payer: Self-pay | Admitting: Family Medicine

## 2020-04-17 DIAGNOSIS — Z8739 Personal history of other diseases of the musculoskeletal system and connective tissue: Secondary | ICD-10-CM

## 2020-05-04 ENCOUNTER — Other Ambulatory Visit: Payer: Self-pay | Admitting: *Deleted

## 2020-05-04 MED ORDER — ATORVASTATIN CALCIUM 40 MG PO TABS: 40.0000 mg | ORAL_TABLET | Freq: Every day | ORAL | 0 refills | Status: DC

## 2020-05-16 DIAGNOSIS — H2513 Age-related nuclear cataract, bilateral: Secondary | ICD-10-CM | POA: Diagnosis not present

## 2020-05-16 DIAGNOSIS — E119 Type 2 diabetes mellitus without complications: Secondary | ICD-10-CM | POA: Diagnosis not present

## 2020-05-16 DIAGNOSIS — D3130 Benign neoplasm of unspecified choroid: Secondary | ICD-10-CM | POA: Diagnosis not present

## 2020-05-16 DIAGNOSIS — H524 Presbyopia: Secondary | ICD-10-CM | POA: Diagnosis not present

## 2020-05-16 DIAGNOSIS — Z7984 Long term (current) use of oral hypoglycemic drugs: Secondary | ICD-10-CM | POA: Diagnosis not present

## 2020-05-16 DIAGNOSIS — H25013 Cortical age-related cataract, bilateral: Secondary | ICD-10-CM | POA: Diagnosis not present

## 2020-05-16 LAB — HM DIABETES EYE EXAM

## 2020-05-31 NOTE — Progress Notes (Signed)
Cardiology Office Note   Date:  06/01/2020   ID:  Brandon Stone, DOB 04/18/1946, MRN [id number]  PCP:  Darreld Mclean, MD  Cardiologist:   Dorris Carnes, MD     F/U of CAD    History of Present Illness: Brandon Stone is a 74 y.o. male with a history of CAD (s/p CABG in 1999; [place] in 1998. Redo CABG in 2004 (SVG to OM/LCx; LRA to PDA). Cath in July 2013 due to increased CP showed severe LM disease into LCx.; SVG to OM 100%; LIMA to Diag and LAD patent; Radial Artery to PDA patent LVEF 45% Plan was to continue medical Rx. 2018    Echo showed LVEF normal  Mild diastolic dysfunciton.    I saw the pt in March 2021     The pt says he is feeling good   He denies CP  Breathing is good as long as doesn't start out too quick   He bikes and walks   Thinks some of SOB early on may be related to cold weather The pt says since starting Repatha palpitations he used to have  are gone    Diet:  Breakfast:  Oatmeal  Sometimes egs or grits   Mostly cereal Lunch  Sandwich or left overs Dinner:   Wife cooks healthy meals  Snacks:  Ice cream is weakness  Bothers GI tract   Occasional potato chips  Water  Rare soda   Current Meds  Medication Sig  . acetaminophen (TYLENOL) 325 MG tablet Take 325 mg by mouth every 6 (six) hours as needed for mild pain.  Marland Kitchen allopurinol (ZYLOPRIM) 300 MG tablet TAKE 1 TABLET EVERY DAY  . amLODipine (NORVASC) 5 MG tablet TAKE 1 TABLET EVERY DAY ( DOSE DECREASE )  . aspirin EC 81 MG tablet Take 81 mg by mouth daily.  Marland Kitchen atorvastatin (LIPITOR) 40 MG tablet Take 1 tablet (40 mg total) by mouth daily.  . blood glucose meter kit and supplies Dispense based on patient and insurance preference. Use up to four times daily as directed. (FOR ICD-10 E10.9, E11.9).  Marland Kitchen doxycycline (VIBRA-TABS) 100 MG tablet Take 1 tablet (100 mg total) by mouth 2 (two) times daily.  . Evolocumab (REPATHA SURECLICK) 903 MG/ML SOAJ Inject 1 pen into the skin every 14 (fourteen) days.  Marland Kitchen icosapent  Ethyl (VASCEPA) 1 g capsule Take 2 capsules (2 g total) by mouth 2 (two) times daily.  . isosorbide mononitrate (IMDUR) 60 MG 24 hr tablet TAKE 1 TABLET EVERY DAY  . loratadine (CLARITIN) 10 MG tablet Take 10 mg by mouth daily as needed for allergies.  . metFORMIN (GLUCOPHAGE) 500 MG tablet Take 500 mg by mouth 2 (two) times daily with a meal.  . metoprolol succinate (TOPROL-XL) 25 MG 24 hr tablet Take 1 tablet (25 mg total) by mouth in the morning and at bedtime.  . MULTIPLE VITAMIN-FOLIC ACID PO Take 1 tablet by mouth daily. Contains 833 mg folic acid  . nitroGLYCERIN (NITROSTAT) 0.4 MG SL tablet Place 1 tablet (0.4 mg total) under the tongue every 5 (five) minutes as needed for chest pain.  Marland Kitchen omeprazole (PRILOSEC) 20 MG capsule TAKE 1 CAPSULE TWICE DAILY  . OVER THE COUNTER MEDICATION 2 (two) times daily as needed. Magnilife DB-Pain relieving foot cream.  . potassium chloride SA (KLOR-CON) 20 MEQ tablet Take 1 tablet (20 mEq total) by mouth every evening.  . promethazine-dextromethorphan (PROMETHAZINE-DM) 6.25-15 MG/5ML syrup Take 5 mLs by mouth 4 (four) times daily  as needed.  . triamcinolone cream (KENALOG) 0.1 % Apply 1 application topically 2 (two) times daily. Use as needed for dry or itchy skin  . valsartan-hydrochlorothiazide (DIOVAN-HCT) 160-25 MG tablet TAKE 1 TABLET EVERY DAY     Allergies:   Other, Rosuvastatin, Ticlopidine hcl, Black pepper [piper], and Codeine   Past Medical History:  Diagnosis Date  . Anginal pain ([place])   . Arthritis   . Barrett's esophagus   . BPH (benign prostatic hypertrophy)   . CAD (coronary artery disease)     a. CABG 1991;  b.  [place];  c.  S/P PTCA  stents to SVG ot OM last in 2003;  d.  Redo CABG x 2 in 2004 (VG->OM->LCX, LRA->PDA);  e. 12/2011 Cath: 3vd, sev LM into LCX dzs, VG->OM 100, LIMA->DIAG->LAD patent, Rad Art->PDA patent, EF 45%, Med Rx.  . Diverticulosis   . DM (diabetes mellitus) ([place])   . Dyslipidemia   . Dyspnea   . Gallstones    . GERD (gastroesophageal reflux disease)   . Gout   . History of hiatal hernia   . HTN (hypertension)   . Hyperlipidemia   . Leukemia in remission ([place])   . Psoriasis   . Rheumatic fever    as a child  . S/P coronary artery stent placement 1998  . TIA (transient ischemic attack)    FOUND ON MRI  PER PATIENT-     NO PROBLEM    Past Surgical History:  Procedure Laterality Date  . APPENDECTOMY    . CHOLECYSTECTOMY N/A 09/18/2016   Procedure: LAPAROSCOPIC CHOLECYSTECTOMY WITH INTRAOPERATIVE CHOLANGIOGRAM;  Surgeon: Jackolyn Confer, MD;  Location: [facility];  Service: General;  Laterality: N/A;  . COLONOSCOPY  07/2013  . CORONARY ANGIOPLASTY     STENTS  . CORONARY ARTERY BYPASS GRAFT     1991 (LIMA to LAD/Diag;  SVG to RCA;  SVG to OM)  . CORONARY ARTERY BYPASS GRAFT     2004 (L radial to PDA; SVG to OM1/ distal LCx)  . TONSILLECTOMY AND ADENOIDECTOMY    . UMBILICAL HERNIA REPAIR       Social History:  The patient  reports that he quit smoking about 48 years ago. He quit after 8.00 years of use. He has never used smokeless tobacco. He reports that he does not drink alcohol and does not use drugs.   Family History:  The patient's family history includes Diabetes in his father and mother; Heart disease in his father and mother; Hyperlipidemia in his father and mother; Hypertension in his father and mother; Skin cancer in his mother; Stroke in his brother and sister.    ROS:  Please see the history of present illness. All other systems are reviewed and  Negative to the above problem except as noted.    PHYSICAL EXAM: VS:  BP 140/80   Pulse 80   Ht _0  (1.803 m)   Wt 223 lb (101.2 kg)   SpO2 96%   BMI 31.10 kg/m   GEN: Obese 74 yo, in no acute distress  HEENT: normal  Neck: JVP is not elevated    No carotid bruits Cardiac: RRR; no murmurs  No LE  edema  Respiratory:  clear to auscultation bilaterally, normal work of breathing GI: soft, nontender,  Obese, + BS  No  hepatomegaly  MS: no deformity Moving all extremities   Skin: warm and dry, no rash Neuro:  Strength and sensation are intact Psych: euthymic mood, full affect  EKG:  EKG is  ordered today.  NSR 80 bpm  Inferolateral MI   Nonspecific IVCD     Lipid Panel    Component Value Date/Time   CHOL 68 (L) 01/24/2020 0926   TRIG 127 01/24/2020 0926   HDL 37 (L) 01/24/2020 0926   CHOLHDL 1.8 01/24/2020 0926   CHOLHDL 3 07/13/2019 1330   VLDL 41.2 (H) 07/13/2019 1330   LDLCALC 9 01/24/2020 0926   LDLDIRECT 82.0 07/13/2019 1330      Wt Readings from Last 3 Encounters:  06/01/20 223 lb (101.2 kg)  10/19/19 215 lb (97.5 kg)  10/03/19 206 lb (93.4 kg)      ASSESSMENT AND PLAN:  1   CAD doing well.  No CP  No signif dyspnea  Stay active  2  HTN  BP is OK  Follow at hme  Check periodically   Gal around 120-130s  3  HL Will check lipids  On Repatha  4  Obesity Discussed wt and diet.   Will get A1C  5  Heme patient followed by Burney Gauze.  F/U in 1 year     Current medicines are reviewed at length with the patient today.  The patient does not have concerns regarding medicines.  Signed, Dorris Carnes, MD  06/01/2020 8:36 AM    [address], [place], [place]  [address] Phone: [phone]; Fax: [phone]

## 2020-06-01 ENCOUNTER — Ambulatory Visit: Payer: Medicare HMO | Admitting: Internal Medicine

## 2020-06-01 ENCOUNTER — Encounter: Payer: Self-pay | Admitting: Internal Medicine

## 2020-06-01 ENCOUNTER — Other Ambulatory Visit: Payer: Self-pay

## 2020-06-01 VITALS — BP 140/80 | HR 80 | Ht 71.0 in | Wt 223.0 lb

## 2020-06-01 DIAGNOSIS — I2510 Atherosclerotic heart disease of native coronary artery without angina pectoris: Secondary | ICD-10-CM | POA: Diagnosis not present

## 2020-06-01 DIAGNOSIS — Z125 Encounter for screening for malignant neoplasm of prostate: Secondary | ICD-10-CM | POA: Diagnosis not present

## 2020-06-01 DIAGNOSIS — E785 Hyperlipidemia, unspecified: Secondary | ICD-10-CM

## 2020-06-01 DIAGNOSIS — Z1329 Encounter for screening for other suspected endocrine disorder: Secondary | ICD-10-CM

## 2020-06-01 DIAGNOSIS — E119 Type 2 diabetes mellitus without complications: Secondary | ICD-10-CM | POA: Diagnosis not present

## 2020-06-01 NOTE — Patient Instructions (Signed)
Medication Instructions:  No changes  *If you need a refill on your cardiac medications before your next appointment, please call your pharmacy*   Lab Work: Today: cbc, bmet, lipids, psa, A1c, tsh  If you have labs (blood work) drawn today and your tests are completely normal, you will receive your results only by: Marland Kitchen MyChart Message (if you have MyChart) OR . A paper copy in the mail If you have any lab test that is abnormal or we need to change your treatment, we will call you to review the results.   Testing/Procedures: none   Follow-Up: At [facility], you and your health needs are our priority.  As part of our continuing mission to provide you with exceptional heart care, we have created designated Provider Care Teams.  These Care Teams include your primary Cardiologist (physician) and Advanced Practice Providers (APPs -  Physician Assistants and Nurse Practitioners) who all work together to provide you with the care you need, when you need it.  Your next appointment:   12 month(s)  The format for your next appointment:   In Person  Provider:   You may see Dorris Carnes, MD or one of the following Advanced Practice Providers on your designated Care Team:    Richardson Dopp, PA-C  Robbie Lis, [place]   Other Instructions

## 2020-06-02 LAB — LIPID PANEL
Chol/HDL Ratio: 1.9 ratio (ref 0.0–5.0)
Cholesterol, Total: 79 mg/dL — ABNORMAL LOW (ref 100–199)
HDL: 42 mg/dL (ref >39)
LDL Chol Calc (NIH): 12 mg/dL (ref 0–99)
Triglycerides: 148 mg/dL (ref 0–149)
VLDL Cholesterol Cal: 25 mg/dL (ref 5–40)

## 2020-06-02 LAB — BASIC METABOLIC PANEL
BUN/Creatinine Ratio: 16 (ref 10–24)
BUN: 18 mg/dL (ref 8–27)
CO2: 24 mmol/L (ref 20–29)
Calcium: 9.8 mg/dL (ref 8.6–10.2)
Chloride: 103 mmol/L (ref 96–106)
Creatinine, Ser: 1.1 mg/dL (ref 0.76–1.27)
GFR calc Af Amer: 77 mL/min/{1.73_m2} (ref >59)
GFR calc non Af Amer: 67 mL/min/{1.73_m2} (ref >59)
Glucose: 178 mg/dL — ABNORMAL HIGH (ref 65–99)
Potassium: 3.5 mmol/L (ref 3.5–5.2)
Sodium: 143 mmol/L (ref 134–144)

## 2020-06-02 LAB — PSA: Prostate Specific Ag, Serum: 0.9 ng/mL (ref 0.0–4.0)

## 2020-06-02 LAB — CBC
Hematocrit: 43.2 % (ref 37.5–51.0)
Hemoglobin: 15 g/dL (ref 13.0–17.7)
MCH: 31.6 pg (ref 26.6–33.0)
MCHC: 34.7 g/dL (ref 31.5–35.7)
MCV: 91 fL (ref 79–97)
Platelets: 224 10*3/uL (ref 150–450)
RBC: 4.74 x10E6/uL (ref 4.14–5.80)
RDW: 12.4 % (ref 11.6–15.4)
WBC: 13.5 10*3/uL — ABNORMAL HIGH (ref 3.4–10.8)

## 2020-06-02 LAB — HEMOGLOBIN A1C
Est. average glucose Bld gHb Est-mCnc: 171 mg/dL
Hgb A1c MFr Bld: 7.6 % — ABNORMAL HIGH (ref 4.8–5.6)

## 2020-06-02 LAB — TSH: TSH: 2.32 u[IU]/mL (ref 0.450–4.500)

## 2020-06-06 ENCOUNTER — Encounter: Payer: Self-pay | Admitting: Hematology & Oncology

## 2020-06-06 ENCOUNTER — Telehealth: Payer: Self-pay

## 2020-06-06 ENCOUNTER — Inpatient Hospital Stay: Payer: Medicare HMO

## 2020-06-06 ENCOUNTER — Inpatient Hospital Stay: Payer: Medicare HMO | Attending: Hematology & Oncology | Admitting: Hematology & Oncology

## 2020-06-06 ENCOUNTER — Other Ambulatory Visit: Payer: Self-pay

## 2020-06-06 VITALS — BP 144/83 | HR 73 | Temp 98.2°F | Resp 20 | Wt 221.1 lb

## 2020-06-06 DIAGNOSIS — C9110 Chronic lymphocytic leukemia of B-cell type not having achieved remission: Secondary | ICD-10-CM

## 2020-06-06 LAB — CBC WITH DIFFERENTIAL (CANCER CENTER ONLY)
Abs Immature Granulocytes: 0.04 10*3/uL (ref 0.00–0.07)
Basophils Absolute: 0 10*3/uL (ref 0.0–0.1)
Basophils Relative: 0 %
Eosinophils Absolute: 0.1 10*3/uL (ref 0.0–0.5)
Eosinophils Relative: 1 %
HCT: 42.7 % (ref 39.0–52.0)
Hemoglobin: 14.5 g/dL (ref 13.0–17.0)
Immature Granulocytes: 0 %
Lymphocytes Relative: 60 %
Lymphs Abs: 8.3 10*3/uL — ABNORMAL HIGH (ref 0.7–4.0)
MCH: 30.7 pg (ref 26.0–34.0)
MCHC: 34 g/dL (ref 30.0–36.0)
MCV: 90.5 fL (ref 80.0–100.0)
Monocytes Absolute: 0.8 10*3/uL (ref 0.1–1.0)
Monocytes Relative: 6 %
Neutro Abs: 4.6 10*3/uL (ref 1.7–7.7)
Neutrophils Relative %: 33 %
Platelet Count: 219 10*3/uL (ref 150–400)
RBC: 4.72 MIL/uL (ref 4.22–5.81)
RDW: 12.3 % (ref 11.5–15.5)
WBC Count: 13.9 10*3/uL — ABNORMAL HIGH (ref 4.0–10.5)
nRBC: 0 % (ref 0.0–0.2)

## 2020-06-06 LAB — CMP (CANCER CENTER ONLY)
ALT: 29 U/L (ref 0–44)
AST: 24 U/L (ref 15–41)
Albumin: 4.4 g/dL (ref 3.5–5.0)
Alkaline Phosphatase: 68 U/L (ref 38–126)
Anion gap: 8 (ref 5–15)
BUN: 16 mg/dL (ref 8–23)
CO2: 30 mmol/L (ref 22–32)
Calcium: 9.7 mg/dL (ref 8.9–10.3)
Chloride: 101 mmol/L (ref 98–111)
Creatinine: 0.98 mg/dL (ref 0.61–1.24)
GFR, Estimated: >60 mL/min (ref >60)
Glucose, Bld: 188 mg/dL — ABNORMAL HIGH (ref 70–99)
Potassium: 3.6 mmol/L (ref 3.5–5.1)
Sodium: 139 mmol/L (ref 135–145)
Total Bilirubin: 0.9 mg/dL (ref 0.3–1.2)
Total Protein: 6.4 g/dL — ABNORMAL LOW (ref 6.5–8.1)

## 2020-06-06 LAB — SAVE SMEAR(SSMR), FOR PROVIDER SLIDE REVIEW

## 2020-06-06 NOTE — Telephone Encounter (Signed)
appts made and printed for pt per 06/06/20 los... AOM 

## 2020-06-06 NOTE — Progress Notes (Signed)
Hematology and Oncology Follow Up Visit  [name] [id number] [birth year] 74 y.o. 06/06/2020   Principle Diagnosis:   Stage A CLL  Current Therapy:    Observation     Interim History:  Brandon Stone is back for follow-up.  We saw him a year ago.  Everything is going well for him.  He has had no problems since we last saw him.  He has been doing a lot of woodworking.  He is showing some of his recent projects.  He has had no problems with the coronavirus.  He has not been vaccinated.  He has been very cautious.  He has had no swollen lymph nodes.  He has had no change in bowel or bladder habits.  He has had no nausea or vomiting.  There is been no fever.  He has had no leg swelling.  His appetite has been good.  He had a very nice Thanksgiving.  Overall, his performance status is ECOG 0.   Medications:  Current Outpatient Medications:  .  acetaminophen (TYLENOL) 325 MG tablet, Take 325 mg by mouth every 6 (six) hours as needed for mild pain., Disp: , Rfl:  .  allopurinol (ZYLOPRIM) 300 MG tablet, TAKE 1 TABLET EVERY DAY, Disp: 90 tablet, Rfl: 2 .  amLODipine (NORVASC) 5 MG tablet, TAKE 1 TABLET EVERY DAY ( DOSE DECREASE ), Disp: 90 tablet, Rfl: 2 .  aspirin EC 81 MG tablet, Take 81 mg by mouth daily., Disp: , Rfl:  .  atorvastatin (LIPITOR) 40 MG tablet, Take 1 tablet (40 mg total) by mouth daily., Disp: 90 tablet, Rfl: 0 .  blood glucose meter kit and supplies, Dispense based on patient and insurance preference. Use up to four times daily as directed. (FOR ICD-10 E10.9, E11.9)., Disp: 1 each, Rfl: 0 .  Evolocumab (REPATHA SURECLICK) 159 MG/ML SOAJ, Inject 1 pen into the skin every 14 (fourteen) days., Disp: 6 pen, Rfl: 3 .  icosapent Ethyl (VASCEPA) 1 g capsule, Take 2 capsules (2 g total) by mouth 2 (two) times daily., Disp: 360 capsule, Rfl: 3 .  isosorbide mononitrate (IMDUR) 60 MG 24 hr tablet, TAKE 1 TABLET EVERY DAY, Disp: 90 tablet, Rfl: 3 .  loratadine (CLARITIN) 10 MG  tablet, Take 10 mg by mouth daily as needed for allergies., Disp: , Rfl:  .  metFORMIN (GLUCOPHAGE) 500 MG tablet, Take 500 mg by mouth 2 (two) times daily with a meal., Disp: , Rfl:  .  metoprolol succinate (TOPROL-XL) 25 MG 24 hr tablet, Take 1 tablet (25 mg total) by mouth in the morning and at bedtime., Disp: 180 tablet, Rfl: 3 .  MULTIPLE VITAMIN-FOLIC ACID PO, Take 1 tablet by mouth daily. Contains 458 mg folic acid, Disp: , Rfl:  .  omeprazole (PRILOSEC) 20 MG capsule, TAKE 1 CAPSULE TWICE DAILY, Disp: 180 capsule, Rfl: 3 .  potassium chloride SA (KLOR-CON) 20 MEQ tablet, Take 1 tablet (20 mEq total) by mouth every evening., Disp: 90 tablet, Rfl: 3 .  triamcinolone cream (KENALOG) 0.1 %, Apply 1 application topically 2 (two) times daily. Use as needed for dry or itchy skin, Disp: 30 g, Rfl: 2 .  valsartan-hydrochlorothiazide (DIOVAN-HCT) 160-25 MG tablet, TAKE 1 TABLET EVERY DAY, Disp: 90 tablet, Rfl: 3 .  nitroGLYCERIN (NITROSTAT) 0.4 MG SL tablet, Place 1 tablet (0.4 mg total) under the tongue every 5 (five) minutes as needed for chest pain. (Patient not taking: Reported on 06/06/2020), Disp: 25 tablet, Rfl: 3 .  promethazine-dextromethorphan (PROMETHAZINE-DM) 6.25-15  MG/5ML syrup, Take 5 mLs by mouth 4 (four) times daily as needed. (Patient not taking: Reported on 06/06/2020), Disp: 118 mL, Rfl: 0  Allergies:  Allergies  Allergen Reactions  . Other Hives    Intolerance to strong pain medications  Rash to opsite and tegaderm tape.  . Rosuvastatin     Nausea, abdominal discomfort  . Ticlopidine Hcl Hives and Itching  . Black Pepper [[place]] Palpitations    HEART PALPITATIONS HEART PALPITATIONS  . Codeine Palpitations and Other (See Comments)    Wild dreams, palpitations "wild dreams"    Past Medical History, Surgical history, Social history, and Family History were reviewed and updated.  Review of Systems: Review of Systems  Constitutional: Negative.   HENT:  Negative.    Eyes: Negative.   Respiratory: Negative.   Cardiovascular: Negative.   Gastrointestinal: Negative.   Endocrine: Negative.   Genitourinary: Negative.    Musculoskeletal: Negative.   Skin: Negative.   Neurological: Negative.   Hematological: Negative.   Psychiatric/Behavioral: Negative.     Physical Exam:  weight is 221 lb 1.3 oz (100.3 kg). His oral temperature is 98.2 F (36.8 C). His blood pressure is 144/83 (abnormal) and his pulse is 73. His respiration is 20 and oxygen saturation is 97%.   Wt Readings from Last 3 Encounters:  06/06/20 221 lb 1.3 oz (100.3 kg)  06/01/20 223 lb (101.2 kg)  10/19/19 215 lb (97.5 kg)    Physical Exam Vitals reviewed.  HENT:     Head: Normocephalic and atraumatic.  Eyes:     Pupils: Pupils are equal, round, and reactive to light.  Cardiovascular:     Rate and Rhythm: Normal rate and regular rhythm.     Heart sounds: Normal heart sounds.  Pulmonary:     Effort: Pulmonary effort is normal.     Breath sounds: Normal breath sounds.  Abdominal:     General: Bowel sounds are normal.     Palpations: Abdomen is soft.  Musculoskeletal:        General: No tenderness or deformity. Normal range of motion.     Cervical back: Normal range of motion.  Lymphadenopathy:     Cervical: No cervical adenopathy.  Skin:    General: Skin is warm and dry.     Findings: No erythema or rash.  Neurological:     Mental Status: He is alert and oriented to person, place, and time.  Psychiatric:        Behavior: Behavior normal.        Thought Content: Thought content normal.        Judgment: Judgment normal.      Lab Results  Component Value Date   WBC 13.9 (H) 06/06/2020   HGB 14.5 06/06/2020   HCT 42.7 06/06/2020   MCV 90.5 06/06/2020   PLT 219 06/06/2020     Chemistry      Component Value Date/Time   NA 139 06/06/2020 0947   NA 143 06/01/2020 0900   NA 143 06/11/2017 1044   NA 140 04/27/2014 0815   K 3.6 06/06/2020 0947   K 3.2 (L)  06/11/2017 1044   K 3.7 04/27/2014 0815   CL 101 06/06/2020 0947   CL 101 06/11/2017 1044   CO2 30 06/06/2020 0947   CO2 28 06/11/2017 1044   CO2 26 04/27/2014 0815   BUN 16 06/06/2020 0947   BUN 18 06/01/2020 0900   BUN 17 06/11/2017 1044   BUN 18.5 04/27/2014 0815   CREATININE  0.98 06/06/2020 0947   CREATININE 1.1 06/11/2017 1044   CREATININE 1.2 04/27/2014 0815      Component Value Date/Time   CALCIUM 9.7 06/06/2020 0947   CALCIUM 9.7 06/11/2017 1044   CALCIUM 9.8 04/27/2014 0815   ALKPHOS 68 06/06/2020 0947   ALKPHOS 83 06/11/2017 1044   ALKPHOS 71 04/27/2014 0815   AST 24 06/06/2020 0947   AST 18 04/27/2014 0815   ALT 29 06/06/2020 0947   ALT 32 06/11/2017 1044   ALT 25 04/27/2014 0815   BILITOT 0.9 06/06/2020 0947   BILITOT 0.67 04/27/2014 0815       Impression and Plan: Mr. Musselman is a 74 year old white male.  He has stage A CLL.  He has minimal lymphocytosis.  His white cell count really has not gone up much.  I did note that his lymphocytes are trending upward.  This we will have to be careful with.  I still do not see any indication that he needs any therapy.  We still can follow him up in 1 year.  He knows he can come back sooner if he has any problems.   Volanda Napoleon, MD 12/15/202111:31 AM

## 2020-06-28 DIAGNOSIS — R519 Headache, unspecified: Secondary | ICD-10-CM | POA: Diagnosis not present

## 2020-06-28 DIAGNOSIS — R0981 Nasal congestion: Secondary | ICD-10-CM | POA: Diagnosis not present

## 2020-06-28 DIAGNOSIS — R051 Acute cough: Secondary | ICD-10-CM | POA: Diagnosis not present

## 2020-06-28 DIAGNOSIS — A78 Q fever: Secondary | ICD-10-CM | POA: Diagnosis not present

## 2020-06-28 DIAGNOSIS — K591 Functional diarrhea: Secondary | ICD-10-CM | POA: Diagnosis not present

## 2020-06-28 DIAGNOSIS — Z20828 Contact with and (suspected) exposure to other viral communicable diseases: Secondary | ICD-10-CM | POA: Diagnosis not present

## 2020-06-28 DIAGNOSIS — R509 Fever, unspecified: Secondary | ICD-10-CM | POA: Diagnosis not present

## 2020-06-29 ENCOUNTER — Other Ambulatory Visit: Payer: Self-pay | Admitting: Family Medicine

## 2020-06-29 ENCOUNTER — Other Ambulatory Visit: Payer: Self-pay | Admitting: Internal Medicine

## 2020-06-29 DIAGNOSIS — I10 Essential (primary) hypertension: Secondary | ICD-10-CM

## 2020-06-29 DIAGNOSIS — E876 Hypokalemia: Secondary | ICD-10-CM

## 2020-07-02 ENCOUNTER — Telehealth: Payer: Self-pay | Admitting: Family Medicine

## 2020-07-02 MED ORDER — METFORMIN HCL 500 MG PO TABS: 500.0000 mg | ORAL_TABLET | Freq: Two times a day (BID) | ORAL | 0 refills | Status: DC

## 2020-07-02 MED ORDER — METFORMIN HCL 500 MG PO TABS: 500.0000 mg | ORAL_TABLET | Freq: Two times a day (BID) | ORAL | 1 refills | Status: DC

## 2020-07-02 NOTE — Addendum Note (Signed)
Addended by: Wynonia Musty A on: 07/02/2020 11:23 AM   Modules accepted: Orders

## 2020-07-02 NOTE — Telephone Encounter (Signed)
Patient has only 4 pills left. [facility]ills [facility]  Medication: metFORMIN (GLUCOPHAGE) 500 MG tablet [[id number]]       Has the patient contacted their pharmacy?  (If no, request that the patient contact the pharmacy for the refill.) (If yes, when and what did the pharmacy advise?)     Preferred Pharmacy (with phone number or street name):  1- [id number] - [place], [address]  [address], [place] [address]  Phone:  [phone] Fax:  [phone]    2-  [facility] - [place], [address]  [place], [place] [address]  Phone:  [phone] Fax:  [phone]   Agent: Please be advised that RX refills may take up to 3 business days. We ask that you follow-up with your pharmacy.

## 2020-07-02 NOTE — Telephone Encounter (Signed)
Refills have been sent.  

## 2020-07-17 ENCOUNTER — Other Ambulatory Visit: Payer: Self-pay | Admitting: Internal Medicine

## 2020-07-24 ENCOUNTER — Other Ambulatory Visit: Payer: Self-pay | Admitting: Internal Medicine

## 2020-07-25 MED ORDER — ICOSAPENT ETHYL 1 G PO CAPS: 2.0000 g | ORAL_CAPSULE | Freq: Two times a day (BID) | ORAL | 3 refills | Status: DC

## 2020-08-22 NOTE — Patient Instructions (Addendum)
Good to see you today - we are going to try a course of antibiotics and will plan to get a CT scan as well.  I will order the scan- just need to determine exactly what scan order to place for you We will get a renal function panel for you in preparation for the CT   Please let me know if anything is changing or getting worse!

## 2020-08-22 NOTE — Progress Notes (Deleted)
[facility] [address] [place], [place] [address] [phone]  Date:  08/23/2020   Name:  Brandon Stone   DOB:  [birth year]   MRN:  [id number]  PCP:  Darreld Mclean, MD    Chief Complaint: No chief complaint on file.   History of Present Illness:  Brandon Stone is a 75 y.o. very pleasant male patient who presents with the following:  Patient here today with concern of facial pain Last seen by myself about one year ago  History of diabetes, CAD, CLL, hypertension, hyperlipidemia, subdural hematoma in March 2020 with no sequelae  Seen by Marin Olp in December for his CLL- stable  Lab Results  Component Value Date   HGBA1C 7.6 (H) 06/01/2020   Flu vaccine covid vaccine   Patient Active Problem List   Diagnosis Date Noted  . AAA (abdominal aortic aneurysm) ([place]) 10/31/2019  . Subdural hematoma ([place]) 09/08/2018  . Diabetes mellitus without complication ([place]) 63/87/5643  . Symptomatic cholelithiasis 09/18/2016  . Chronic lymphocytic leukemia (CLL), B-cell ([place]) 05/13/2014  . Stable angina ([place]) 01/26/2012  . Sore throat 11/03/2010  . Hyperlipidemia 06/21/2008  . HYPERTENSION, BENIGN 06/21/2008  . CAD, NATIVE VESSEL 06/21/2008    Past Medical History:  Diagnosis Date  . Anginal pain ([place])   . Arthritis   . Barrett's esophagus   . BPH (benign prostatic hypertrophy)   . CAD (coronary artery disease)     a. CABG 1991;  b.  [place];  c.  S/P PTCA  stents to SVG ot OM last in 2003;  d.  Redo CABG x 2 in 2004 (VG->OM->LCX, LRA->PDA);  e. 12/2011 Cath: 3vd, sev LM into LCX dzs, VG->OM 100, LIMA->DIAG->LAD patent, Rad Art->PDA patent, EF 45%, Med Rx.  . Diverticulosis   . DM (diabetes mellitus) ([place])   . Dyslipidemia   . Dyspnea   . Gallstones   . GERD (gastroesophageal reflux disease)   . Gout   . History of hiatal hernia   . HTN (hypertension)   . Hyperlipidemia   . Leukemia in remission ([place])   .  Psoriasis   . Rheumatic fever    as a child  . S/P coronary artery stent placement 1998  . TIA (transient ischemic attack)    FOUND ON MRI  PER PATIENT-     NO PROBLEM    Past Surgical History:  Procedure Laterality Date  . APPENDECTOMY    . CHOLECYSTECTOMY N/A 09/18/2016   Procedure: LAPAROSCOPIC CHOLECYSTECTOMY WITH INTRAOPERATIVE CHOLANGIOGRAM;  Surgeon: Jackolyn Confer, MD;  Location: [facility];  Service: General;  Laterality: N/A;  . COLONOSCOPY  07/2013  . CORONARY ANGIOPLASTY     STENTS  . CORONARY ARTERY BYPASS GRAFT     1991 (LIMA to LAD/Diag;  SVG to RCA;  SVG to OM)  . CORONARY ARTERY BYPASS GRAFT     2004 (L radial to PDA; SVG to OM1/ distal LCx)  . TONSILLECTOMY AND ADENOIDECTOMY    . UMBILICAL HERNIA REPAIR      Social History   Tobacco Use  . Smoking status: Former Smoker    Years: 8.00    Quit date: 06/24/1971    Years since quitting: 49.1  . Smokeless tobacco: Never Used  Substance Use Topics  . Alcohol use: No  . Drug use: No    Family History  Problem Relation Age of Onset  . Heart disease Mother   . Hypertension Mother   .  Hyperlipidemia Mother   . Diabetes Mother   . Skin cancer Mother        skin  . Heart disease Father   . Hypertension Father   . Hyperlipidemia Father   . Diabetes Father   . Stroke Sister   . Stroke Brother   . Colon cancer Neg Hx     Allergies  Allergen Reactions  . Other Hives    Intolerance to strong pain medications  Rash to opsite and tegaderm tape.  . Rosuvastatin     Nausea, abdominal discomfort  . Ticlopidine Hcl Hives and Itching  . Black Pepper [[place]] Palpitations    HEART PALPITATIONS HEART PALPITATIONS  . Codeine Palpitations and Other (See Comments)    Wild dreams, palpitations "wild dreams"    Medication list has been reviewed and updated.  Current Outpatient Medications on File Prior to Visit  Medication Sig Dispense Refill  . acetaminophen (TYLENOL) 325 MG tablet Take 325 mg by mouth every 6  (six) hours as needed for mild pain.    Marland Kitchen allopurinol (ZYLOPRIM) 300 MG tablet TAKE 1 TABLET EVERY DAY 90 tablet 2  . amLODipine (NORVASC) 5 MG tablet TAKE 1 TABLET EVERY DAY 90 tablet 3  . aspirin EC 81 MG tablet Take 81 mg by mouth daily.    Marland Kitchen atorvastatin (LIPITOR) 40 MG tablet Take 1 tablet (40 mg total) by mouth daily. 90 tablet 0  . blood glucose meter kit and supplies Dispense based on patient and insurance preference. Use up to four times daily as directed. (FOR ICD-10 E10.9, E11.9). 1 each 0  . icosapent Ethyl (VASCEPA) 1 g capsule Take 2 capsules (2 g total) by mouth 2 (two) times daily. 360 capsule 3  . isosorbide mononitrate (IMDUR) 60 MG 24 hr tablet TAKE 1 TABLET EVERY DAY 90 tablet 3  . loratadine (CLARITIN) 10 MG tablet Take 10 mg by mouth daily as needed for allergies.    . metFORMIN (GLUCOPHAGE) 500 MG tablet Take 1 tablet (500 mg total) by mouth 2 (two) times daily with a meal. 180 tablet 1  . metoprolol succinate (TOPROL-XL) 25 MG 24 hr tablet Take 1 tablet (25 mg total) by mouth in the morning and at bedtime. 180 tablet 3  . MULTIPLE VITAMIN-FOLIC ACID PO Take 1 tablet by mouth daily. Contains 149 mg folic acid    . nitroGLYCERIN (NITROSTAT) 0.4 MG SL tablet Place 1 tablet (0.4 mg total) under the tongue every 5 (five) minutes as needed for chest pain. (Patient not taking: Reported on 06/06/2020) 25 tablet 3  . omeprazole (PRILOSEC) 20 MG capsule TAKE 1 CAPSULE TWICE DAILY 180 capsule 3  . potassium chloride SA (KLOR-CON) 20 MEQ tablet Take 1 tablet (20 mEq total) by mouth daily. 90 tablet 0  . promethazine-dextromethorphan (PROMETHAZINE-DM) 6.25-15 MG/5ML syrup Take 5 mLs by mouth 4 (four) times daily as needed. (Patient not taking: Reported on 06/06/2020) 118 mL 0  . REPATHA SURECLICK 702 MG/ML SOAJ INJECT 1 PEN INTO THE SKIN EVERY 14 (FOURTEEN) DAYS. 6 mL 3  . triamcinolone cream (KENALOG) 0.1 % Apply 1 application topically 2 (two) times daily. Use as needed for dry or itchy  skin 30 g 2  . valsartan-hydrochlorothiazide (DIOVAN-HCT) 160-25 MG tablet TAKE 1 TABLET EVERY DAY 90 tablet 3   No current facility-administered medications on file prior to visit.    Review of Systems:  As per HPI- otherwise negative.   Physical Examination: There were no vitals filed for this visit. There  were no vitals filed for this visit. There is no height or weight on file to calculate BMI. Ideal Body Weight:    GEN: no acute distress. HEENT: Atraumatic, Normocephalic.  Ears and Nose: No external deformity. CV: RRR, No M/G/R. No JVD. No thrill. No extra heart sounds. PULM: CTA B, no wheezes, crackles, rhonchi. No retractions. No resp. distress. No accessory muscle use. ABD: S, NT, ND, +BS. No rebound. No HSM. EXTR: No c/c/e PSYCH: Normally interactive. Conversant.    Assessment and Plan: *** This visit occurred during the SARS-CoV-2 public health emergency.  Safety protocols were in place, including screening questions prior to the visit, additional usage of staff PPE, and extensive cleaning of exam room while observing appropriate contact time as indicated for disinfecting solutions.    Signed Lamar Blinks, MD

## 2020-08-23 ENCOUNTER — Encounter: Payer: Self-pay | Admitting: Family Medicine

## 2020-08-23 ENCOUNTER — Ambulatory Visit (INDEPENDENT_AMBULATORY_CARE_PROVIDER_SITE_OTHER): Payer: Medicare HMO | Admitting: Family Medicine

## 2020-08-23 ENCOUNTER — Other Ambulatory Visit: Payer: Self-pay

## 2020-08-23 VITALS — BP 144/98 | HR 87 | Temp 98.4°F | Resp 17 | Ht 71.0 in | Wt 218.0 lb

## 2020-08-23 DIAGNOSIS — K0889 Other specified disorders of teeth and supporting structures: Secondary | ICD-10-CM | POA: Diagnosis not present

## 2020-08-23 DIAGNOSIS — R590 Localized enlarged lymph nodes: Secondary | ICD-10-CM | POA: Diagnosis not present

## 2020-08-23 LAB — BASIC METABOLIC PANEL
BUN: 19 mg/dL (ref 6–23)
CO2: 29 mEq/L (ref 19–32)
Calcium: 9.9 mg/dL (ref 8.4–10.5)
Chloride: 103 mEq/L (ref 96–112)
Creatinine, Ser: 0.98 mg/dL (ref 0.40–1.50)
GFR: 77.18 mL/min (ref >60.00)
Glucose, Bld: 160 mg/dL — ABNORMAL HIGH (ref 70–99)
Potassium: 3.7 mEq/L (ref 3.5–5.1)
Sodium: 141 mEq/L (ref 135–145)

## 2020-08-23 MED ORDER — PENICILLIN V POTASSIUM 500 MG PO TABS: 500.0000 mg | ORAL_TABLET | Freq: Three times a day (TID) | ORAL | 0 refills | Status: DC

## 2020-08-23 NOTE — Progress Notes (Signed)
Brandon Stone Brandon Stone, Brandon Stone, Brandon Stone [id number] [phone]  Date:  08/23/2020   Name:  Brandon Stone   DOB:  May 06, 1946   MRN:  [id number]  PCP:  Darreld Mclean, MD    Chief Complaint: Jaw Pain (Started as toothache, tenderness in jaw and radiates right right ear. )   History of Present Illness:  Brandon Stone is a 75 y.o. very pleasant male patient who presents with the following:  Patient here today with concern of facial pain Last seen by myself about one year ago  History of diabetes, CAD, CLL, hypertension, hyperlipidemia, subdural hematoma in March 2020 with no sequelae  Seen by Marin Olp in December for his CLL- stable Diabetes has been under reasonable control  Lab Results  Component Value Date   HGBA1C 7.6 (H) 06/01/2020    Pain seemed to start in his teeth, and now has spread into his ear and jaw This has been going on for about a year, gradually getting worse Sometimes he will have a sharp pain which can be quite severe He has noted some intermittent swelling of the submandibular nodes on the right side as well It does hurt to chew- he cannot chew on the right side  He did see his DDS- they did not see any tooth issue He tried a numbing OTC topical for his gums but it did not really help  No fever No ST No change in his hearing or vision noted  Patient Active Problem List   Diagnosis Date Noted  . AAA (abdominal aortic aneurysm) ([place]) 10/31/2019  . Subdural hematoma ([place]) 09/08/2018  . Diabetes mellitus without complication ([place]) 77/93/9030  . Symptomatic cholelithiasis 09/18/2016  . Chronic lymphocytic leukemia (CLL), B-cell ([place]) 05/13/2014  . Stable angina ([place]) 01/26/2012  . Sore throat 11/03/2010  . Hyperlipidemia 06/21/2008  . HYPERTENSION, BENIGN 06/21/2008  . CAD, NATIVE VESSEL 06/21/2008    Past Medical History:  Diagnosis Date  . Anginal pain ([place])   . Arthritis   .  Barrett's esophagus   . BPH (benign prostatic hypertrophy)   . CAD (coronary artery disease)     a. CABG 1991;  b.  [place];  c.  S/P PTCA  stents to SVG ot OM last in 2003;  d.  Redo CABG x 2 in 2004 (VG->OM->LCX, LRA->PDA);  e. 12/2011 Cath: 3vd, sev LM into LCX dzs, VG->OM 100, LIMA->DIAG->LAD patent, Rad Art->PDA patent, EF 45%, Med Rx.  . Diverticulosis   . DM (diabetes mellitus) ([place])   . Dyslipidemia   . Dyspnea   . Gallstones   . GERD (gastroesophageal reflux disease)   . Gout   . History of hiatal hernia   . HTN (hypertension)   . Hyperlipidemia   . Leukemia in remission ([place])   . Psoriasis   . Rheumatic fever    as a child  . S/P coronary artery stent placement 1998  . TIA (transient ischemic attack)    FOUND ON MRI  PER PATIENT-     NO PROBLEM    Past Surgical History:  Procedure Laterality Date  . APPENDECTOMY    . CHOLECYSTECTOMY N/A 09/18/2016   Procedure: LAPAROSCOPIC CHOLECYSTECTOMY WITH INTRAOPERATIVE CHOLANGIOGRAM;  Surgeon: Jackolyn Confer, MD;  Location: [facility];  Service: General;  Laterality: N/A;  . COLONOSCOPY  07/2013  . CORONARY ANGIOPLASTY     STENTS  . CORONARY ARTERY BYPASS GRAFT  1991 (LIMA to LAD/Diag;  SVG to RCA;  SVG to OM)  . CORONARY ARTERY BYPASS GRAFT     2004 (L radial to PDA; SVG to OM1/ distal LCx)  . TONSILLECTOMY AND ADENOIDECTOMY    . UMBILICAL HERNIA REPAIR      Social History   Tobacco Use  . Smoking status: Former Smoker    Years: 8.00    Quit date: 06/24/1971    Years since quitting: 49.2  . Smokeless tobacco: Never Used  Substance Use Topics  . Alcohol use: No  . Drug use: No    Family History  Problem Relation Age of Onset  . Heart disease Mother   . Hypertension Mother   . Hyperlipidemia Mother   . Diabetes Mother   . Skin cancer Mother        skin  . Heart disease Father   . Hypertension Father   . Hyperlipidemia Father   . Diabetes Father   . Stroke Sister   . Stroke Brother   . Colon cancer Neg Hx      Allergies  Allergen Reactions  . Other Hives    Intolerance to strong pain medications  Rash to opsite and tegaderm tape.  . Rosuvastatin     Nausea, abdominal discomfort  . Ticlopidine Hcl Hives and Itching  . Black Pepper [[place]] Palpitations    HEART PALPITATIONS HEART PALPITATIONS  . Codeine Palpitations and Other (See Comments)    Wild dreams, palpitations "wild dreams"    Medication list has been reviewed and updated.  Current Outpatient Medications on File Prior to Visit  Medication Sig Dispense Refill  . acetaminophen (TYLENOL) 325 MG tablet Take 325 mg by mouth every 6 (six) hours as needed for mild pain.    Marland Kitchen allopurinol (ZYLOPRIM) 300 MG tablet TAKE 1 TABLET EVERY DAY 90 tablet 2  . amLODipine (NORVASC) 5 MG tablet TAKE 1 TABLET EVERY DAY 90 tablet 3  . aspirin EC 81 MG tablet Take 81 mg by mouth daily.    . blood glucose meter kit and supplies Dispense based on patient and insurance preference. Use up to four times daily as directed. (FOR ICD-10 E10.9, E11.9). 1 each 0  . icosapent Ethyl (VASCEPA) 1 g capsule Take 2 capsules (2 g total) by mouth 2 (two) times daily. 360 capsule 3  . isosorbide mononitrate (IMDUR) 60 MG 24 hr tablet TAKE 1 TABLET EVERY DAY 90 tablet 3  . loratadine (CLARITIN) 10 MG tablet Take 10 mg by mouth daily as needed for allergies.    . metFORMIN (GLUCOPHAGE) 500 MG tablet Take 1 tablet (500 mg total) by mouth 2 (two) times daily with a meal. 180 tablet 1  . metoprolol succinate (TOPROL-XL) 25 MG 24 hr tablet Take 1 tablet (25 mg total) by mouth in the morning and at bedtime. 180 tablet 3  . MULTIPLE VITAMIN-FOLIC ACID PO Take 1 tablet by mouth daily. Contains 975 mg folic acid    . nitroGLYCERIN (NITROSTAT) 0.4 MG SL tablet Place 1 tablet (0.4 mg total) under the tongue every 5 (five) minutes as needed for chest pain. 25 tablet 3  . omeprazole (PRILOSEC) 20 MG capsule TAKE 1 CAPSULE TWICE DAILY 180 capsule 3  . potassium chloride SA  (KLOR-CON) 20 MEQ tablet Take 1 tablet (20 mEq total) by mouth daily. 90 tablet 0  . promethazine-dextromethorphan (PROMETHAZINE-DM) 6.25-15 MG/5ML syrup Take 5 mLs by mouth 4 (four) times daily as needed. 118 mL 0  . REPATHA SURECLICK 883  MG/ML SOAJ INJECT 1 PEN INTO THE SKIN EVERY 14 (FOURTEEN) DAYS. 6 mL 3  . triamcinolone cream (KENALOG) 0.1 % Apply 1 application topically 2 (two) times daily. Use as needed for dry or itchy skin 30 g 2  . valsartan-hydrochlorothiazide (DIOVAN-HCT) 160-25 MG tablet TAKE 1 TABLET EVERY DAY 90 tablet 3  . atorvastatin (LIPITOR) 40 MG tablet Take 1 tablet (40 mg total) by mouth daily. 90 tablet 0   No current facility-administered medications on file prior to visit.    Review of Systems:  As per HPI- otherwise negative.   Physical Examination: Vitals:   08/23/20 1126  BP: (!) 144/98  Pulse: 87  Resp: 17  Temp: 98.4 F (36.9 C)  SpO2: 99%   Vitals:   08/23/20 1126  Weight: 218 lb (98.9 kg)  Height: '5\' 11"'  (1.803 m)   Body mass index is 30.4 kg/m. Ideal Body Weight: Weight in (lb) to have BMI = 25: 178.9  GEN: no acute distress.  Overweight, otherwise looks well HEENT: Atraumatic, Normocephalic.  Ears and Nose: No external deformity. CV: RRR, No M/G/R. No JVD. No thrill. No extra heart sounds. PULM: CTA B, no wheezes, crackles, rhonchi. No retractions. No resp. distress. No accessory muscle use. EXTR: No c/c/e PSYCH: Normally interactive. Conversant.  Approximately 1.5 cm meter diameter node in the right angle of the jaw -patient notes this is location of tenderness Not tender over the TMJ He is tender over the right-sided mandibular posterior molar, which is actually a gold cap over tooth I did not see any evidence of abscess  Assessment and Plan: Pain in tooth - Plan: penicillin v potassium (VEETID) 500 MG tablet, CT Maxillofacial W/Cm  LAD (lymphadenopathy) of right cervical region - Plan: Basic metabolic panel, CT Maxillofacial  W/Cm  Patient with history of CLL, here today with pain in the right face and jaw for about a year.  Dental evaluation failed to reveal cause.  He does have some tender lymphadenopathy around the jaw, more concerning in this patient with history of cancer.  We will try a course of penicillin in case this may simply be a dental infection.  He does have tenderness over one particular tooth.  Also plan to obtain a CT to evaluate the area and assess lymphadenopathy  Will set up [facility] at the [facility] for him  This visit occurred during the SARS-CoV-2 public health emergency.  Safety protocols were in place, including screening questions prior to the visit, additional usage of staff PPE, and extensive cleaning of exam room while observing appropriate contact time as indicated for disinfecting solutions.    Signed Lamar Blinks, MD  Received labs as below: Message to patient.  I also spoke with radiology who recommended a CT face which I have ordered Results for orders placed or performed in visit on 57/47/34  Basic metabolic panel  Result Value Ref Range   Sodium 141 135 - 145 mEq/L   Potassium 3.7 3.5 - 5.1 mEq/L   Chloride 103 96 - 112 mEq/L   CO2 29 19 - 32 mEq/L   Glucose, Bld 160 (H) 70 - 99 mg/dL   BUN 19 6 - 23 mg/dL   Creatinine, Ser 0.98 0.40 - 1.50 mg/dL   GFR 77.18 >60.00 mL/min   Calcium 9.9 8.4 - 10.5 mg/dL   Message to pt No concern with getting CT scan

## 2020-08-24 ENCOUNTER — Telehealth: Payer: Self-pay | Admitting: Family Medicine

## 2020-08-24 DIAGNOSIS — K0889 Other specified disorders of teeth and supporting structures: Secondary | ICD-10-CM

## 2020-08-24 MED ORDER — HYDROCODONE-ACETAMINOPHEN 5-325 MG PO TABS: 0.5000 | ORAL_TABLET | Freq: Three times a day (TID) | ORAL | 0 refills | Status: AC | PRN

## 2020-08-24 NOTE — Telephone Encounter (Signed)
Patient is requesting pain medication be sent to local pharmacy, patient states he jaw and teeth care still hurting . Patient seen yesterday by Dr Lorelei Pont  Please advise

## 2020-08-24 NOTE — Telephone Encounter (Signed)
Called pt back to discuss.  He is still having a fair amount of pain from his tooth which will come and go.  He is taking plain Tylenol which is okay, but sometimes the pain is still severe.  He notes he has used codeine in the past which caused some unusual dreams, but no true allergy  He does have history of CAD, and a subdural hematoma in the past would prefer to avoid NSAIDs if we can  Called in hydrocodone/APAP for patient to use.  Advised him this may cause sedation, use sparingly.  Also watch total daily doses of Tylenol

## 2020-08-27 ENCOUNTER — Telehealth: Payer: Self-pay | Admitting: Family Medicine

## 2020-08-27 NOTE — Telephone Encounter (Signed)
Patient call to inform Dr.Copland  That he still hasn't receive a phone call from  from CT  Please advice

## 2020-08-27 NOTE — Telephone Encounter (Signed)
Spoke with imaging. They are calling him to schedule now.

## 2020-08-28 ENCOUNTER — Other Ambulatory Visit: Payer: Self-pay

## 2020-08-28 ENCOUNTER — Ambulatory Visit (HOSPITAL_BASED_OUTPATIENT_CLINIC_OR_DEPARTMENT_OTHER)
Admission: RE | Admit: 2020-08-28 | Discharge: 2020-08-28 | Disposition: A | Discharge status: Home / Self Care | Payer: Medicare HMO | Source: Ambulatory Visit | Attending: Family Medicine | Admitting: Family Medicine

## 2020-08-28 ENCOUNTER — Encounter: Payer: Self-pay | Admitting: Family Medicine

## 2020-08-28 DIAGNOSIS — R590 Localized enlarged lymph nodes: Secondary | ICD-10-CM | POA: Insufficient documentation

## 2020-08-28 DIAGNOSIS — K0889 Other specified disorders of teeth and supporting structures: Secondary | ICD-10-CM | POA: Insufficient documentation

## 2020-08-28 DIAGNOSIS — I672 Cerebral atherosclerosis: Secondary | ICD-10-CM | POA: Diagnosis not present

## 2020-08-28 DIAGNOSIS — C801 Malignant (primary) neoplasm, unspecified: Secondary | ICD-10-CM | POA: Diagnosis not present

## 2020-08-28 DIAGNOSIS — I6523 Occlusion and stenosis of bilateral carotid arteries: Secondary | ICD-10-CM | POA: Diagnosis not present

## 2020-08-28 DIAGNOSIS — R599 Enlarged lymph nodes, unspecified: Secondary | ICD-10-CM | POA: Diagnosis not present

## 2020-08-28 MED ORDER — IOHEXOL 300 MG/ML  SOLN
100.0000 mL | Freq: Once | INTRAMUSCULAR | Status: AC | PRN
  Administered 2020-08-28: 80 mL via INTRAVENOUS

## 2020-09-05 ENCOUNTER — Other Ambulatory Visit: Payer: Self-pay | Admitting: Internal Medicine

## 2020-09-10 ENCOUNTER — Other Ambulatory Visit: Payer: Self-pay | Admitting: Internal Medicine

## 2020-09-10 ENCOUNTER — Other Ambulatory Visit: Payer: Self-pay | Admitting: Family Medicine

## 2020-09-10 DIAGNOSIS — K219 Gastro-esophageal reflux disease without esophagitis: Secondary | ICD-10-CM

## 2020-09-10 DIAGNOSIS — E876 Hypokalemia: Secondary | ICD-10-CM

## 2020-09-24 ENCOUNTER — Telehealth: Payer: Self-pay | Admitting: Family Medicine

## 2020-09-24 MED ORDER — METFORMIN HCL 500 MG PO TABS: 500.0000 mg | ORAL_TABLET | Freq: Two times a day (BID) | ORAL | 1 refills | Status: DC

## 2020-09-24 NOTE — Telephone Encounter (Signed)
Medication: metFORMIN (GLUCOPHAGE) 500 MG tablet [[id number]]       Has the patient contacted their pharmacy?  (If no, request that the patient contact the pharmacy for the refill.) (If yes, when and what did the pharmacy advise?)     Preferred Pharmacy (with phone number or street name): [facility], [address]  [place], [place] [address]  Phone:  [phone] Fax:  [phone]      Agent: Please be advised that RX refills may take up to 3 business days. We ask that you follow-up with your pharmacy.

## 2020-09-24 NOTE — Telephone Encounter (Signed)
Medication refilled

## 2020-10-08 ENCOUNTER — Ambulatory Visit: Payer: Medicare HMO | Admitting: *Deleted

## 2020-10-10 NOTE — Progress Notes (Signed)
Subjective:   Brandon Stone is a 75 y.o. male who presents for Medicare Annual/Subsequent preventive examination.   I connected with Rahul today by telephone and verified that I am speaking with the correct person using two identifiers. Location patient: home Location provider: work Persons participating in the virtual visit: patient, Marine scientist.    I discussed the limitations, risks, security and privacy concerns of performing an evaluation and management service by telephone and the availability of in person appointments. I also discussed with the patient that there may be a patient responsible charge related to this service. The patient expressed understanding and verbally consented to this telephonic visit.    Interactive audio and video telecommunications were attempted between this provider and patient, however failed, due to patient having technical difficulties OR patient did not have access to video capability.  We continued and completed visit with audio only.  Some vital signs may be absent or patient reported.   Time Spent with patient on telephone encounter: 20 minutes  Review of Systems     Cardiac Risk Factors include: advanced age (>53mn, >>45women);diabetes mellitus;dyslipidemia;hypertension;obesity (BMI >30kg/m2)     Objective:    Today's Vitals   10/11/20 1021  Weight: 218 lb (98.9 kg)  Height: '5\' 11"'  (1.803 m)   Body mass index is 30.4 kg/m.  Advanced Directives 10/11/2020 06/06/2020 10/03/2019 07/01/2019 06/15/2019 09/30/2018 09/08/2018  Does Patient Have a Medical Advance Directive? No No No No No No No  Would patient like information on creating a medical advance directive? No - Patient declined No - Patient declined No - Patient declined - No - Patient declined No - Patient declined No - Patient declined  Pre-existing out of facility DNR order (yellow form or pink MOST form) - - - - - - -    Current Medications (verified) Outpatient Encounter Medications as of  10/11/2020  Medication Sig  . acetaminophen (TYLENOL) 325 MG tablet Take 325 mg by mouth every 6 (six) hours as needed for mild pain.  .Marland Kitchenallopurinol (ZYLOPRIM) 300 MG tablet TAKE 1 TABLET EVERY DAY  . amLODipine (NORVASC) 5 MG tablet TAKE 1 TABLET EVERY DAY  . aspirin EC 81 MG tablet Take 81 mg by mouth daily.  .Marland Kitchenatorvastatin (LIPITOR) 40 MG tablet Take 1 tablet (40 mg total) by mouth daily.  . blood glucose meter kit and supplies Dispense based on patient and insurance preference. Use up to four times daily as directed. (FOR ICD-10 E10.9, E11.9).  .Marland Kitchenicosapent Ethyl (VASCEPA) 1 g capsule Take 2 capsules (2 g total) by mouth 2 (two) times daily.  . isosorbide mononitrate (IMDUR) 60 MG 24 hr tablet TAKE 1 TABLET EVERY DAY  . loratadine (CLARITIN) 10 MG tablet Take 10 mg by mouth daily as needed for allergies.  . metFORMIN (GLUCOPHAGE) 500 MG tablet Take 1 tablet (500 mg total) by mouth 2 (two) times daily with a meal.  . metoprolol succinate (TOPROL-XL) 25 MG 24 hr tablet TAKE 1 TABLET IN THE MORNING  AND AT BEDTIME  . MULTIPLE VITAMIN-FOLIC ACID PO Take 1 tablet by mouth daily. Contains 4248mg folic acid  . nitroGLYCERIN (NITROSTAT) 0.4 MG SL tablet Place 1 tablet (0.4 mg total) under the tongue every 5 (five) minutes as needed for chest pain.  .Marland Kitchenomeprazole (PRILOSEC) 20 MG capsule Take 1 capsule (20 mg total) by mouth 2 (two) times daily.  . potassium chloride SA (KLOR-CON) 20 MEQ tablet Take 1 tablet (20 mEq total) by mouth daily.  .Marland Kitchen  promethazine-dextromethorphan (PROMETHAZINE-DM) 6.25-15 MG/5ML syrup Take 5 mLs by mouth 4 (four) times daily as needed.  Marland Kitchen REPATHA SURECLICK 295 MG/ML SOAJ INJECT 1 PEN INTO THE SKIN EVERY 14 (FOURTEEN) DAYS.  Marland Kitchen triamcinolone cream (KENALOG) 0.1 % Apply 1 application topically 2 (two) times daily. Use as needed for dry or itchy skin  . valsartan-hydrochlorothiazide (DIOVAN-HCT) 160-25 MG tablet TAKE 1 TABLET EVERY DAY  . [DISCONTINUED] penicillin v potassium  (VEETID) 500 MG tablet Take 1 tablet (500 mg total) by mouth 3 (three) times daily.   No facility-administered encounter medications on file as of 10/11/2020.    Allergies (verified) Other, Rosuvastatin, Ticlopidine hcl, Black pepper [piper], and Codeine   History: Past Medical History:  Diagnosis Date  . Anginal pain ([place])   . Arthritis   . Barrett's esophagus   . BPH (benign prostatic hypertrophy)   . CAD (coronary artery disease)     a. CABG 1991;  b.  [place];  c.  S/P PTCA  stents to SVG ot OM last in 2003;  d.  Redo CABG x 2 in 2004 (VG->OM->LCX, LRA->PDA);  e. 12/2011 Cath: 3vd, sev LM into LCX dzs, VG->OM 100, LIMA->DIAG->LAD patent, Rad Art->PDA patent, EF 45%, Med Rx.  . Diverticulosis   . DM (diabetes mellitus) ([place])   . Dyslipidemia   . Dyspnea   . Gallstones   . GERD (gastroesophageal reflux disease)   . Gout   . History of hiatal hernia   . HTN (hypertension)   . Hyperlipidemia   . Leukemia in remission ([place])   . Psoriasis   . Rheumatic fever    as a child  . S/P coronary artery stent placement 1998  . TIA (transient ischemic attack)    FOUND ON MRI  PER PATIENT-     NO PROBLEM   Past Surgical History:  Procedure Laterality Date  . APPENDECTOMY    . CHOLECYSTECTOMY N/A 09/18/2016   Procedure: LAPAROSCOPIC CHOLECYSTECTOMY WITH INTRAOPERATIVE CHOLANGIOGRAM;  Surgeon: Jackolyn Confer, MD;  Location: [facility];  Service: General;  Laterality: N/A;  . COLONOSCOPY  07/2013  . CORONARY ANGIOPLASTY     STENTS  . CORONARY ARTERY BYPASS GRAFT     1991 (LIMA to LAD/Diag;  SVG to RCA;  SVG to OM)  . CORONARY ARTERY BYPASS GRAFT     2004 (L radial to PDA; SVG to OM1/ distal LCx)  . TONSILLECTOMY AND ADENOIDECTOMY    . UMBILICAL HERNIA REPAIR     Family History  Problem Relation Age of Onset  . Heart disease Mother   . Hypertension Mother   . Hyperlipidemia Mother   . Diabetes Mother   . Skin cancer Mother        skin  . Heart disease Father   . Hypertension  Father   . Hyperlipidemia Father   . Diabetes Father   . Stroke Sister   . Stroke Brother   . Colon cancer Neg Hx    Social History   Socioeconomic History  . Marital status: Married    Spouse name: Not on file  . Number of children: 2  . Years of education: Not on file  . Highest education level: Not on file  Occupational History  . Occupation: retired  Tobacco Use  . Smoking status: Former Smoker    Years: 8.00    Quit date: 06/24/1971    Years since quitting: 49.3  . Smokeless tobacco: Never Used  Substance and Sexual Activity  . Alcohol use: No  .  Drug use: No  . Sexual activity: Yes  Other Topics Concern  . Not on file  Social History Narrative  . Not on file   Social Determinants of Health   Financial Resource Strain: Low Risk   . Difficulty of Paying Living Expenses: Not hard at all  Food Insecurity: No Food Insecurity  . Worried About Charity fundraiser in the Last Year: Never true  . Ran Out of Food in the Last Year: Never true  Transportation Needs: No Transportation Needs  . Lack of Transportation (Medical): No  . Lack of Transportation (Non-Medical): No  Physical Activity: Insufficiently Active  . Days of Exercise per Week: 3 days  . Minutes of Exercise per Session: 30 min  Stress: No Stress Concern Present  . Feeling of Stress : Not at all  Social Connections: Moderately Integrated  . Frequency of Communication with Friends and Family: More than three times a week  . Frequency of Social Gatherings with Friends and Family: More than three times a week  . Attends Religious Services: More than 4 times per year  . Active Member of Clubs or Organizations: No  . Attends Archivist Meetings: Never  . Marital Status: Married    Tobacco Counseling Counseling given: Not Answered   Clinical Intake:  Pre-visit preparation completed: Yes  Pain : No/denies pain     Nutritional Status: BMI > 30  Obese Nutritional Risks: None Diabetes:  Yes CBG done?: No Did pt. bring in CBG monitor from home?: No  How often do you need to have someone help you when you read instructions, pamphlets, or other written materials from your doctor or pharmacy?: 1 - Never  Diabetes:  Is the patient diabetic?  Yes  If diabetic, was a CBG obtained today?  No  Did the patient bring in their glucometer from home?  No phone visit How often do you monitor your CBG's? occasionally.   Financial Strains and Diabetes Management:  Are you having any financial strains with the device, your supplies or your medication? No .  Does the patient want to be seen by Chronic Care Management for management of their diabetes?  No  Would the patient like to be referred to a Nutritionist or for Diabetic Management?  No   Diabetic Exams:  Diabetic Eye Exam: Completed 05/16/2020.   Diabetic Foot Exam:  Pt has been advised about the importance in completing this exam. To be completed by PCP     Interpreter Needed?: No  Information entered by :: Caroleen Hamman LPN   Activities of Daily Living In your present state of health, do you have any difficulty performing the following activities: 10/11/2020  Hearing? N  Vision? N  Difficulty concentrating or making decisions? N  Walking or climbing stairs? N  Dressing or bathing? N  Doing errands, shopping? N  Preparing Food and eating ? N  Using the Toilet? N  In the past six months, have you accidently leaked urine? Y  Comment occasionally  Do you have problems with loss of bowel control? N  Managing your Medications? N  Managing your Finances? N  Housekeeping or managing your Housekeeping? N  Some recent data might be hidden    Patient Care Team: Copland, Gay Filler, MD as PCP - General (Family Medicine) Fay Records, MD as PCP - Cardiology (Cardiology) Magrinat, Virgie Dad, MD as Consulting Physician (Oncology)  Indicate any recent Medical Services you may have received from other than [facility] providers  in the past year (date may be approximate).     Assessment:   This is a routine wellness examination for Brandon Stone.  Hearing/Vision screen  Hearing Screening   '125Hz'  '250Hz'  '500Hz'  '1000Hz'  '2000Hz'  '3000Hz'  '4000Hz'  '6000Hz'  '8000Hz'   Right ear:           Left ear:           Comments: No issues  Vision Screening Comments: Last eye exam-04/2020-Dr.Snipes Reading glasses  Dietary issues and exercise activities discussed: Current Exercise Habits: Home exercise routine, Type of exercise: walking, Time (Minutes): 30, Frequency (Times/Week): 3, Weekly Exercise (Minutes/Week): 90, Intensity: Mild, Exercise limited by: None identified  Goals    . Increase physical activity      Depression Screen PHQ 2/9 Scores 10/11/2020 10/03/2019 09/30/2018 09/28/2017 02/05/2017  PHQ - 2 Score 0 0 0 0 0  Exception Documentation - - - - Patient refusal    Fall Risk Fall Risk  10/11/2020 10/03/2019 09/30/2018 09/28/2017 02/05/2017  Falls in the past year? 0 0 0 No No  Number falls in past yr: 0 0 - - -  Injury with Fall? 0 0 - - -  Follow up Falls prevention discussed Education provided;Falls prevention discussed - - -    FALL RISK PREVENTION PERTAINING TO THE HOME:  Any stairs in or around the home? No  Home free of loose throw rugs in walkways, pet beds, electrical cords, etc? Yes  Adequate lighting in your home to reduce risk of falls? Yes   ASSISTIVE DEVICES UTILIZED TO PREVENT FALLS:  Life alert? No  Use of a cane, walker or w/c? No  Grab bars in the bathroom? No  Shower chair or bench in shower? No  Elevated toilet seat or a handicapped toilet? No   TIMED UP AND GO:  Was the test performed? No . Phone visit   Cognitive Function:Normal cognitive status assessed by  this Nurse Health Advisor. No abnormalities found.   MMSE - Mini Mental State Exam 09/28/2017  Orientation to time 5  Orientation to Place 5  Registration 3  Attention/ Calculation 5  Recall 3  Language- name 2 objects 2  Language- repeat 1   Language- follow 3 step command 3  Language- read & follow direction 1  Write a sentence 1        Immunizations Immunization History  Administered Date(s) Administered  . Influenza, High Dose Seasonal PF 05/07/2015, 05/06/2016, 05/11/2017, 04/05/2018  . Pneumococcal Conjugate-13 05/11/2017  . Pneumococcal Polysaccharide-23 12/19/2014  . Tdap 12/19/2016    TDAP status: Up to date  Flu Vaccine status: Due, Education has been provided regarding the importance of this vaccine. Advised may receive this vaccine at local pharmacy or Health Dept. Aware to provide a copy of the vaccination record if obtained from local pharmacy or Health Dept. Verbalized acceptance and understanding.  Pneumococcal vaccine status: Up to date  Covid-19 vaccine status: Declined, Education has been provided regarding the importance of this vaccine but patient still declined. Advised may receive this vaccine at local pharmacy or Health Dept.or vaccine clinic. Aware to provide a copy of the vaccination record if obtained from local pharmacy or Health Dept. Verbalized acceptance and understanding.  Qualifies for Shingles Vaccine? Yes   Zostavax completed No   Shingrix Completed?: No.    Education has been provided regarding the importance of this vaccine. Patient has been advised to call insurance company to determine out of pocket expense if they have not yet received this vaccine. Advised may also  receive vaccine at local pharmacy or Health Dept. Verbalized acceptance and understanding.  Screening Tests Health Maintenance  Topic Date Due  . FOOT EXAM  05/25/2019  . COVID-19 Vaccine (1) 10/27/2020 (Originally 05/28/1958)  . HEMOGLOBIN A1C  11/30/2020  . INFLUENZA VACCINE  01/21/2021  . OPHTHALMOLOGY EXAM  05/16/2021  . COLONOSCOPY (Pts 45-22yr Insurance coverage will need to be confirmed)  07/21/2026  . TETANUS/TDAP  12/20/2026  . Hepatitis C Screening  Completed  . PNA vac Low Risk Adult  Completed  . HPV  VACCINES  Aged Out    Health Maintenance  Health Maintenance Due  Topic Date Due  . FOOT EXAM  05/25/2019    Colorectal cancer screening: Type of screening: Colonoscopy. Completed 07/21/2016. Repeat every unknown years-Patient thinks he was told to return in 1 year but never followed up. He plans to call GI asap to find out when he needs to repeat colonoscopy.  Lung Cancer Screening: (Low Dose CT Chest recommended if Age 73-80years, 30 pack-year currently smoking OR have quit w/in 15years.) does not qualify.    Additional Screening:  Hepatitis C Screening:Completed 05/11/2017  Vision Screening: Recommended annual ophthalmology exams for early detection of glaucoma and other disorders of the eye. Is the patient up to date with their annual eye exam?  Yes  Who is the provider or what is the name of the office in which the patient attends annual eye exams? Dr. SRenaldo Fiddler  Dental Screening: Recommended annual dental exams for proper oral hygiene  Community Resource Referral / Chronic Care Management: CRR required this visit?  No   CCM required this visit?  No      Plan:     I have personally reviewed and noted the following in the patient's chart:   . Medical and social history . Use of alcohol, tobacco or illicit drugs  . Current medications and supplements . Functional ability and status . Nutritional status . Physical activity . Advanced directives . List of other physicians . Hospitalizations, surgeries, and ER visits in previous 12 months . Vitals . Screenings to include cognitive, depression, and falls . Referrals and appointments  In addition, I have reviewed and discussed with patient certain preventive protocols, quality metrics, and best practice recommendations. A written personalized care plan for preventive services as well as general preventive health recommendations were provided to patient.   Due to this being a telephonic visit, the after visit summary  with patients personalized plan was offered to patient via mail or my-chart.  Patient would like to access on my-chart.   MMarta Antu LPN   43/15/[id number] Nurse Health Advisor  Nurse Notes: None

## 2020-10-11 ENCOUNTER — Ambulatory Visit (INDEPENDENT_AMBULATORY_CARE_PROVIDER_SITE_OTHER): Payer: Medicare HMO

## 2020-10-11 VITALS — Ht 71.0 in | Wt 218.0 lb

## 2020-10-11 DIAGNOSIS — Z0000 Encounter for general adult medical examination without abnormal findings: Secondary | ICD-10-CM

## 2020-10-11 NOTE — Patient Instructions (Signed)
Brandon Stone , Thank you for taking time to complete your Medicare Wellness Visit. I appreciate your ongoing commitment to your health goals. Please review the following plan we discussed and let me know if I can assist you in the future.   Screening recommendations/referrals: Colonoscopy: Per our discussion today, you will call GI to see when you need to repeat colonoscopy. Recommended yearly ophthalmology/optometry visit for glaucoma screening and checkup Recommended yearly dental visit for hygiene and checkup  Vaccinations: Influenza vaccine: Due-02/2021 Pneumococcal vaccine: Completed vaccines Tdap vaccine: Up to date-12/20/2026 Shingles vaccine: Discuss with pharmacy   Covid-19: Declined  Advanced directives: Declined information today  Conditions/risks identified: See problem list  Next appointment: Follow up in one year for your annual wellness visit. 10/17/2021 @ 12:40  Preventive Care 65 Years and Older, Male Preventive care refers to lifestyle choices and visits with your health care provider that can promote health and wellness. What does preventive care include?  A yearly physical exam. This is also called an annual well check.  Dental exams once or twice a year.  Routine eye exams. Ask your health care provider how often you should have your eyes checked.  Personal lifestyle choices, including:  Daily care of your teeth and gums.  Regular physical activity.  Eating a healthy diet.  Avoiding tobacco and drug use.  Limiting alcohol use.  Practicing safe sex.  Taking low doses of aspirin every day.  Taking vitamin and mineral supplements as recommended by your health care provider. What happens during an annual well check? The services and screenings done by your health care provider during your annual well check will depend on your age, overall health, lifestyle risk factors, and family history of disease. Counseling  Your health care provider may ask you  questions about your:  Alcohol use.  Tobacco use.  Drug use.  Emotional well-being.  Home and relationship well-being.  Sexual activity.  Eating habits.  History of falls.  Memory and ability to understand (cognition).  Work and work Statistician. Screening  You may have the following tests or measurements:  Height, weight, and BMI.  Blood pressure.  Lipid and cholesterol levels. These may be checked every 5 years, or more frequently if you are over 73 years old.  Skin check.  Lung cancer screening. You may have this screening every year starting at age 73 if you have a 30-pack-year history of smoking and currently smoke or have quit within the past 15 years.  Fecal occult blood test (FOBT) of the stool. You may have this test every year starting at age 73.  Flexible sigmoidoscopy or colonoscopy. You may have a sigmoidoscopy every 5 years or a colonoscopy every 10 years starting at age 73.  Prostate cancer screening. Recommendations will vary depending on your family history and other risks.  Hepatitis C blood test.  Hepatitis B blood test.  Sexually transmitted disease (STD) testing.  Diabetes screening. This is done by checking your blood sugar (glucose) after you have not eaten for a while (fasting). You may have this done every 1-3 years.  Abdominal aortic aneurysm (AAA) screening. You may need this if you are a current or former smoker.  Osteoporosis. You may be screened starting at age 73 if you are at high risk. Talk with your health care provider about your test results, treatment options, and if necessary, the need for more tests. Vaccines  Your health care provider may recommend certain vaccines, such as:  Influenza vaccine. This is recommended every  year.  Tetanus, diphtheria, and acellular pertussis (Tdap, Td) vaccine. You may need a Td booster every 10 years.  Zoster vaccine. You may need this after age 73.  Pneumococcal 13-valent conjugate  (PCV13) vaccine. One dose is recommended after age 73.  Pneumococcal polysaccharide (PPSV23) vaccine. One dose is recommended after age 73. Talk to your health care provider about which screenings and vaccines you need and how often you need them. This information is not intended to replace advice given to you by your health care provider. Make sure you discuss any questions you have with your health care provider. Document Released: 07/06/2015 Document Revised: 02/27/2016 Document Reviewed: 04/10/2015 Elsevier Interactive Patient Education  2017 [place] Prevention in the Home Falls can cause injuries. They can happen to people of all ages. There are many things you can do to make your home safe and to help prevent falls. What can I do on the outside of my home?  Regularly fix the edges of walkways and driveways and fix any cracks.  Remove anything that might make you trip as you walk through a door, such as a raised step or threshold.  Trim any bushes or trees on the path to your home.  Use bright outdoor lighting.  Clear any walking paths of anything that might make someone trip, such as rocks or tools.  Regularly check to see if handrails are loose or broken. Make sure that both sides of any steps have handrails.  Any raised decks and porches should have guardrails on the edges.  Have any leaves, snow, or ice cleared regularly.  Use sand or salt on walking paths during winter.  Clean up any spills in your garage right away. This includes oil or grease spills. What can I do in the bathroom?  Use night lights.  Install grab bars by the toilet and in the tub and shower. Do not use towel bars as grab bars.  Use non-skid mats or decals in the tub or shower.  If you need to sit down in the shower, use a plastic, non-slip stool.  Keep the floor dry. Clean up any water that spills on the floor as soon as it happens.  Remove soap buildup in the tub or shower  regularly.  Attach bath mats securely with double-sided non-slip rug tape.  Do not have throw rugs and other things on the floor that can make you trip. What can I do in the bedroom?  Use night lights.  Make sure that you have a light by your bed that is easy to reach.  Do not use any sheets or blankets that are too big for your bed. They should not hang down onto the floor.  Have a firm chair that has side arms. You can use this for support while you get dressed.  Do not have throw rugs and other things on the floor that can make you trip. What can I do in the kitchen?  Clean up any spills right away.  Avoid walking on wet floors.  Keep items that you use a lot in easy-to-reach places.  If you need to reach something above you, use a strong step stool that has a grab bar.  Keep electrical cords out of the way.  Do not use floor polish or wax that makes floors slippery. If you must use wax, use non-skid floor wax.  Do not have throw rugs and other things on the floor that can make you trip. What can  I do with my stairs?  Do not leave any items on the stairs.  Make sure that there are handrails on both sides of the stairs and use them. Fix handrails that are broken or loose. Make sure that handrails are as long as the stairways.  Check any carpeting to make sure that it is firmly attached to the stairs. Fix any carpet that is loose or worn.  Avoid having throw rugs at the top or bottom of the stairs. If you do have throw rugs, attach them to the floor with carpet tape.  Make sure that you have a light switch at the top of the stairs and the bottom of the stairs. If you do not have them, ask someone to add them for you. What else can I do to help prevent falls?  Wear shoes that:  Do not have high heels.  Have rubber bottoms.  Are comfortable and fit you well.  Are closed at the toe. Do not wear sandals.  If you use a stepladder:  Make sure that it is fully  opened. Do not climb a closed stepladder.  Make sure that both sides of the stepladder are locked into place.  Ask someone to hold it for you, if possible.  Clearly mark and make sure that you can see:  Any grab bars or handrails.  First and last steps.  Where the edge of each step is.  Use tools that help you move around (mobility aids) if they are needed. These include:  Canes.  Walkers.  Scooters.  Crutches.  Turn on the lights when you go into a dark area. Replace any light bulbs as soon as they burn out.  Set up your furniture so you have a clear path. Avoid moving your furniture around.  If any of your floors are uneven, fix them.  If there are any pets around you, be aware of where they are.  Review your medicines with your doctor. Some medicines can make you feel dizzy. This can increase your chance of falling. Ask your doctor what other things that you can do to help prevent falls. This information is not intended to replace advice given to you by your health care provider. Make sure you discuss any questions you have with your health care provider. Document Released: 04/05/2009 Document Revised: 11/15/2015 Document Reviewed: 07/14/2014 Elsevier Interactive Patient Education  2017 [employer].
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
## 2020-10-17 ENCOUNTER — Other Ambulatory Visit: Payer: Self-pay

## 2020-10-17 ENCOUNTER — Ambulatory Visit (INDEPENDENT_AMBULATORY_CARE_PROVIDER_SITE_OTHER): Payer: Medicare HMO | Admitting: Family Medicine

## 2020-10-17 ENCOUNTER — Encounter: Payer: Self-pay | Admitting: Family Medicine

## 2020-10-17 VITALS — BP 144/82 | HR 80 | Temp 97.3°F | Resp 19 | Ht 71.0 in | Wt 221.0 lb

## 2020-10-17 DIAGNOSIS — N6081 Other benign mammary dysplasias of right breast: Secondary | ICD-10-CM

## 2020-10-17 DIAGNOSIS — J40 Bronchitis, not specified as acute or chronic: Secondary | ICD-10-CM | POA: Diagnosis not present

## 2020-10-17 MED ORDER — DOXYCYCLINE HYCLATE 100 MG PO CAPS: 100.0000 mg | ORAL_CAPSULE | Freq: Two times a day (BID) | ORAL | 0 refills | Status: DC

## 2020-10-17 MED ORDER — PREDNISONE 20 MG PO TABS: ORAL_TABLET | ORAL | 0 refills | Status: DC

## 2020-10-17 NOTE — Patient Instructions (Signed)
It was good to see you today.  I think the issue with your right nipple was actually a blocked oil duct.  We removed some trapped sebaceous material from the nipple today.  Please use a warm compress 2-3 times a day and then gently squeeze for the next several days as more material may form  Please let me know if your nipple does not get back to normal!  For your cough, we will use doxycycline antibiotic twice a day for 10 days I also prescribed prednisone which is an oral steroid The prednisone will cause your blood sugars to go up, please keep an eye on this.  If your sugars are going greater than 300-350 please alert me  Please let me know if your cough is not improving in the next several days

## 2020-10-17 NOTE — Progress Notes (Signed)
Pt passed out during lab draw. He was pale. Dr.Copland got a BP of 80/40. He was then transferred to a recliner and given some PB crackers with water and some tea.  We have checked on him every 5 min, however he was still in the lab being monitored the whole time.   Around 9:52a. Pt sounding peppier and got a BP of 110/62.  He has eaten half the crackers and drank 80% of the water.   Dr. Lorelei Pont has gone to check up on him once again.

## 2020-10-23 ENCOUNTER — Telehealth: Payer: Self-pay | Admitting: Internal Medicine

## 2020-10-23 NOTE — Telephone Encounter (Signed)
Pt returned our call and we got them re enrolled for the healthwell foundation.  Fatima Sanger Information HealthWell Id [id number] Pre-approval Date 10/23/2020 Patient Name Brandon Stone Received Date 10/23/2020 Fund Hypercholesterolemia - Medicare Access Start Date 09/23/2020 Assistance Type Co-pay End Date 09/22/2021 Is Pharmacy Card? Yes Approval Date 10/23/2020 Status Approved Time to Re-enroll? No Hold On Payments No     Pharmacy Card Information Pharmacy Card Id [id number] Group [id number] PCN [id number] BIN [id number] PROCESSOR PDMI Card Activation Status  Financial Summary Information Grant Cap Amount $2,500.00 Paid Amount $0.00 Grant Balance $2,500.00 Total Payments In Progress $0.00

## 2020-10-23 NOTE — Telephone Encounter (Signed)
Spoke with Linna Hoff, [facility] who states pt is having to pay 400+ dollars this months for his medications and pt states Dr Alan Ripper office normally pays for his [place] and [place].  Linna Hoff confirms pt's last fill was 0.00 dollars.  Requested Linna Hoff have pt contact his insurance company to see if he is currently in the "donut hole" and RN will forward to pharmacy team to see if pt may qualify for patient assistance or grant funding.

## 2020-10-23 NOTE — Telephone Encounter (Signed)
Called and spoke w/pt to reinstate healthwell grant but they stated they need to gether information before proceeding with the renewal application they are unsure of gross income

## 2020-10-23 NOTE — Telephone Encounter (Signed)
[facility] called to speak with triage regarding patient's medicine

## 2020-10-23 NOTE — Telephone Encounter (Signed)
Our office does not pay for his lipid meds, he was in a grant last year that paid for them. Looks like his [employer] expired 07/25/20. [place], can you help re-enroll pt in grant please?

## 2020-11-22 ENCOUNTER — Other Ambulatory Visit: Payer: Self-pay | Admitting: Family Medicine

## 2020-11-22 DIAGNOSIS — Z8739 Personal history of other diseases of the musculoskeletal system and connective tissue: Secondary | ICD-10-CM

## 2021-02-06 ENCOUNTER — Other Ambulatory Visit: Payer: Self-pay | Admitting: Family Medicine

## 2021-02-06 DIAGNOSIS — E876 Hypokalemia: Secondary | ICD-10-CM

## 2021-02-08 NOTE — Progress Notes (Addendum)
[facility] [place], [place], [place] [address] [phone]  Date:  02/13/2021   Name:  Brandon Stone   DOB:  1945-09-27   MRN:  [id number]  PCP:  Darreld Mclean, MD    Chief Complaint: Annual Exam (Discuss high  fasting glucose readings, trended upward after prednisone)   History of Present Illness:  Brandon Stone is a 75 y.o. very pleasant male patient who presents with the following:  Pt seen today for a CPE and A1c check Last visit with myself in April  History of diabetes, CLL, hyperlipidemia, hypertension, CAD, subdural hematoma March 2020  Covid series- he declines to do this  Shingrix- we discussed this and he plans to do it soon  Foot exam is due A1c is due PSA- had increased at last check but he did not want to see urology at that time.  Can recheck today  Lab Results  Component Value Date   PSA1 0.9 06/01/2020   PSA 1.74 07/13/2019   PSA 0.80 01/14/2018   PSA 0.82 08/27/2015   He saw Dr Marin Olp in December- stable, recheck in one year  His wife [name] has endometrial cancer- she had her hysterectomy and is cured!   He took some prednisone around the holidays and this did increase his blood sugars However it seems like his glucose has remained higher than it was in the past   He is taking metformin 500 BID right now but no other  Lab Results  Component Value Date   HGBA1C 7.6 (H) 06/01/2020   He is using repatha which he is tolerated well Not needed nitro   Patient Active Problem List   Diagnosis Date Noted   AAA (abdominal aortic aneurysm) ([facility]) 10/31/2019   Subdural hematoma ([facility]) 09/08/2018   Diabetes mellitus without complication ([facility]) 19/37/9024   Symptomatic cholelithiasis 09/18/2016   Chronic lymphocytic leukemia (CLL), B-cell ([facility]) 05/13/2014   Stable angina ([facility]) 01/26/2012   Sore throat 11/03/2010   Hyperlipidemia 06/21/2008   HYPERTENSION, BENIGN 06/21/2008   CAD, NATIVE  VESSEL 06/21/2008    Past Medical History:  Diagnosis Date   Anginal pain ([facility])    Arthritis    Barrett's esophagus    BPH (benign prostatic hypertrophy)    CAD (coronary artery disease)     a. CABG 1991;  b.  [place];  c.  S/P PTCA  stents to SVG ot OM last in 2003;  d.  Redo CABG x 2 in 2004 (VG->OM->LCX, LRA->PDA);  e. 12/2011 Cath: 3vd, sev LM into LCX dzs, VG->OM 100, LIMA->DIAG->LAD patent, Rad Art->PDA patent, EF 45%, Med Rx.   Diverticulosis    DM (diabetes mellitus) ([facility])    Dyslipidemia    Dyspnea    Gallstones    GERD (gastroesophageal reflux disease)    Gout    History of hiatal hernia    HTN (hypertension)    Hyperlipidemia    Leukemia in remission ([facility])    Psoriasis    Rheumatic fever    as a child   S/P coronary artery stent placement 1998   TIA (transient ischemic attack)    FOUND ON MRI  PER PATIENT-     NO PROBLEM    Past Surgical History:  Procedure Laterality Date   APPENDECTOMY     CHOLECYSTECTOMY N/A 09/18/2016   Procedure: LAPAROSCOPIC CHOLECYSTECTOMY WITH INTRAOPERATIVE CHOLANGIOGRAM;  Surgeon: Jackolyn Confer, MD;  Location: [facility];  Service: General;  Laterality: N/A;   COLONOSCOPY  07/2013   CORONARY ANGIOPLASTY     STENTS   CORONARY ARTERY BYPASS GRAFT     1991 (LIMA to LAD/Diag;  SVG to RCA;  SVG to OM)   CORONARY ARTERY BYPASS GRAFT     2004 (L radial to PDA; SVG to OM1/ distal LCx)   TONSILLECTOMY AND ADENOIDECTOMY     UMBILICAL HERNIA REPAIR      Social History   Tobacco Use   Smoking status: Former    Years: 8.00    Types: Cigarettes    Quit date: 06/24/1971    Years since quitting: 49.6   Smokeless tobacco: Never  Substance Use Topics   Alcohol use: No   Drug use: No    Family History  Problem Relation Age of Onset   Heart disease Mother    Hypertension Mother    Hyperlipidemia Mother    Diabetes Mother    Skin cancer Mother        skin   Heart disease Father    Hypertension Father    Hyperlipidemia Father     Diabetes Father    Stroke Sister    Stroke Brother    Colon cancer Neg Hx     Allergies  Allergen Reactions   Other Hives    Intolerance to strong pain medications  Rash to opsite and tegaderm tape.   Rosuvastatin     Nausea, abdominal discomfort   Ticlopidine Hcl Hives and Itching   Black Pepper [[place]] Palpitations    HEART PALPITATIONS HEART PALPITATIONS   Codeine Palpitations and Other (See Comments)    Wild dreams, palpitations "wild dreams"    Medication list has been reviewed and updated.  Current Outpatient Medications on File Prior to Visit  Medication Sig Dispense Refill   acetaminophen (TYLENOL) 325 MG tablet Take 325 mg by mouth every 6 (six) hours as needed for mild pain.     allopurinol (ZYLOPRIM) 300 MG tablet TAKE 1 TABLET EVERY DAY 90 tablet 2   amLODipine (NORVASC) 5 MG tablet TAKE 1 TABLET EVERY DAY 90 tablet 3   aspirin EC 81 MG tablet Take 81 mg by mouth daily.     atorvastatin (LIPITOR) 40 MG tablet Take 1 tablet (40 mg total) by mouth daily. 90 tablet 2   blood glucose meter kit and supplies Dispense based on patient and insurance preference. Use up to four times daily as directed. (FOR ICD-10 E10.9, E11.9). 1 each 0   doxycycline (VIBRAMYCIN) 100 MG capsule Take 1 capsule (100 mg total) by mouth 2 (two) times daily. 20 capsule 0   icosapent Ethyl (VASCEPA) 1 g capsule Take 2 capsules (2 g total) by mouth 2 (two) times daily. 360 capsule 3   isosorbide mononitrate (IMDUR) 60 MG 24 hr tablet TAKE 1 TABLET EVERY DAY 90 tablet 3   loratadine (CLARITIN) 10 MG tablet Take 10 mg by mouth daily as needed for allergies.     metFORMIN (GLUCOPHAGE) 500 MG tablet Take 1 tablet (500 mg total) by mouth 2 (two) times daily with a meal. 180 tablet 1   metoprolol succinate (TOPROL-XL) 25 MG 24 hr tablet TAKE 1 TABLET IN THE MORNING  AND AT BEDTIME 180 tablet 3   MULTIPLE VITAMIN-FOLIC ACID PO Take 1 tablet by mouth daily. Contains 992 mg folic acid     nitroGLYCERIN  (NITROSTAT) 0.4 MG SL tablet Place 1 tablet (0.4 mg total) under the tongue every 5 (five) minutes as needed for chest  pain. 25 tablet 3   omeprazole (PRILOSEC) 20 MG capsule Take 1 capsule (20 mg total) by mouth 2 (two) times daily. 180 capsule 3   potassium chloride SA (KLOR-CON) 20 MEQ tablet TAKE 1 TABLET EVERY DAY 90 tablet 1   REPATHA SURECLICK 130 MG/ML SOAJ INJECT 1 PEN INTO THE SKIN EVERY 14 (FOURTEEN) DAYS. 6 mL 3   triamcinolone cream (KENALOG) 0.1 % Apply 1 application topically 2 (two) times daily. Use as needed for dry or itchy skin 30 g 2   valsartan-hydrochlorothiazide (DIOVAN-HCT) 160-25 MG tablet TAKE 1 TABLET EVERY DAY 90 tablet 3   No current facility-administered medications on file prior to visit.    Review of Systems:  As per HPI- otherwise negative.   Physical Examination: Vitals:   02/13/21 1009  BP: 136/72  Pulse: 82  Resp: 18  Temp: 98.2 F (36.8 C)  SpO2: 97%   Vitals:   02/13/21 1009  Weight: 219 lb (99.3 kg)  Height: $Remove'5\' 11"'[id number]$  (1.803 m)   Body mass index is 30.54 kg/m. Ideal Body Weight: Weight in (lb) to have BMI = 25: 178.9  GEN: no acute distress.  Obese, otherwise looks well  HEENT: Atraumatic, Normocephalic.  Bilateral TM wnl, oropharynx normal.  PEERL,EOMI.   Ears and Nose: No external deformity. CV: RRR, No M/G/R. No JVD. No thrill. No extra heart sounds. PULM: CTA B, no wheezes, crackles, rhonchi. No retractions. No resp. distress. No accessory muscle use. ABD: S, NT, ND, +BS. No rebound. No HSM. EXTR: No c/c/e PSYCH: Normally interactive. Conversant.  Foot exam:  normal, mild onychomycosis   Assessment and Plan: Physical exam  Essential hypertension - Plan: Comprehensive metabolic panel  Diabetes mellitus without complication ([facility]) - Plan: Hemoglobin A1c  Increased prostate specific antigen (PSA) velocity - Plan: PSA  Hyperlipidemia, unspecified hyperlipidemia type - Plan: Lipid panel  Physical exam- encourage continued  healthy diet and exercise plan Will plan further follow- up pending labs.  He is due for a 3 year colon follow-up and will call to schedule We may need to make a diabetes med adjustment- likely cannot go up on metformin however due to GI side effects  He plans to get shingles vaccine at pharmacy   This visit occurred during the SARS-CoV-2 public health emergency.  Safety protocols were in place, including screening questions prior to the visit, additional usage of staff PPE, and extensive cleaning of exam room while observing appropriate contact time as indicated for disinfecting solutions.   Signed Lamar Blinks, MD  Addendum 8/25, received labs as below-message to patient He is on Repatha Results for orders placed or performed in visit on 02/13/21  Comprehensive metabolic panel  Result Value Ref Range   Sodium 139 135 - 145 mEq/L   Potassium 3.6 3.5 - 5.1 mEq/L   Chloride 100 96 - 112 mEq/L   CO2 28 19 - 32 mEq/L   Glucose, Bld 284 (H) 70 - 99 mg/dL   BUN 18 6 - 23 mg/dL   Creatinine, Ser 1.25 0.40 - 1.50 mg/dL   Total Bilirubin 1.1 0.2 - 1.2 mg/dL   Alkaline Phosphatase 71 39 - 117 U/L   AST 18 0 - 37 U/L   ALT 19 0 - 53 U/L   Total Protein 6.5 6.0 - 8.3 g/dL   Albumin 4.2 3.5 - 5.2 g/dL   GFR 57.44 (L) >60.00 mL/min   Calcium 9.3 8.4 - 10.5 mg/dL  Hemoglobin A1c  Result Value Ref Range   Hgb  A1c MFr Bld 9.3 (H) 4.6 - 6.5 %  PSA  Result Value Ref Range   PSA 0.75 0.10 - 4.00 ng/mL  Lipid panel  Result Value Ref Range   Cholesterol 91 0 - 200 mg/dL   Triglycerides 227.0 (H) 0.0 - 149.0 mg/dL   HDL 37.90 (L) >39.00 mg/dL   VLDL 45.4 (H) 0.0 - 40.0 mg/dL   Total CHOL/HDL Ratio 2    NonHDL 52.89   LDL cholesterol, direct  Result Value Ref Range   Direct LDL 31.0 mg/dL

## 2021-02-08 NOTE — Patient Instructions (Addendum)
It was good to see you again today  Please call GI and schedule your follow-up colon, due this year If we need to we can make a diabetes medication change - will likely add a 2nd medication as opposed to going up on metformin due to side effects

## 2021-02-13 ENCOUNTER — Encounter: Payer: Self-pay | Admitting: Family Medicine

## 2021-02-13 ENCOUNTER — Other Ambulatory Visit: Payer: Self-pay

## 2021-02-13 ENCOUNTER — Ambulatory Visit (INDEPENDENT_AMBULATORY_CARE_PROVIDER_SITE_OTHER): Payer: Medicare HMO | Admitting: Family Medicine

## 2021-02-13 VITALS — BP 136/72 | HR 82 | Temp 98.2°F | Resp 18 | Ht 71.0 in | Wt 219.0 lb

## 2021-02-13 DIAGNOSIS — I10 Essential (primary) hypertension: Secondary | ICD-10-CM

## 2021-02-13 DIAGNOSIS — Z0000 Encounter for general adult medical examination without abnormal findings: Secondary | ICD-10-CM

## 2021-02-13 DIAGNOSIS — E785 Hyperlipidemia, unspecified: Secondary | ICD-10-CM

## 2021-02-13 DIAGNOSIS — E119 Type 2 diabetes mellitus without complications: Secondary | ICD-10-CM | POA: Diagnosis not present

## 2021-02-13 DIAGNOSIS — R972 Elevated prostate specific antigen [PSA]: Secondary | ICD-10-CM

## 2021-02-13 LAB — COMPREHENSIVE METABOLIC PANEL
ALT: 19 U/L (ref 0–53)
AST: 18 U/L (ref 0–37)
Albumin: 4.2 g/dL (ref 3.5–5.2)
Alkaline Phosphatase: 71 U/L (ref 39–117)
BUN: 18 mg/dL (ref 6–23)
CO2: 28 mEq/L (ref 19–32)
Calcium: 9.3 mg/dL (ref 8.4–10.5)
Chloride: 100 mEq/L (ref 96–112)
Creatinine, Ser: 1.25 mg/dL (ref 0.40–1.50)
GFR: 57.44 mL/min — ABNORMAL LOW (ref >60.00)
Glucose, Bld: 284 mg/dL — ABNORMAL HIGH (ref 70–99)
Potassium: 3.6 mEq/L (ref 3.5–5.1)
Sodium: 139 mEq/L (ref 135–145)
Total Bilirubin: 1.1 mg/dL (ref 0.2–1.2)
Total Protein: 6.5 g/dL (ref 6.0–8.3)

## 2021-02-13 LAB — LIPID PANEL
Cholesterol: 91 mg/dL (ref 0–200)
HDL: 37.9 mg/dL — ABNORMAL LOW (ref >39.00)
NonHDL: 52.89
Total CHOL/HDL Ratio: 2
Triglycerides: 227 mg/dL — ABNORMAL HIGH (ref 0.0–149.0)
VLDL: 45.4 mg/dL — ABNORMAL HIGH (ref 0.0–40.0)

## 2021-02-13 LAB — PSA: PSA: 0.75 ng/mL (ref 0.10–4.00)

## 2021-02-13 LAB — HEMOGLOBIN A1C: Hgb A1c MFr Bld: 9.3 % — ABNORMAL HIGH (ref 4.6–6.5)

## 2021-02-13 LAB — LDL CHOLESTEROL, DIRECT: Direct LDL: 31 mg/dL

## 2021-02-14 ENCOUNTER — Encounter: Payer: Self-pay | Admitting: Family Medicine

## 2021-02-14 DIAGNOSIS — E119 Type 2 diabetes mellitus without complications: Secondary | ICD-10-CM

## 2021-02-21 MED ORDER — GLIPIZIDE ER 5 MG PO TB24
5.0000 mg | ORAL_TABLET | Freq: Every day | ORAL | 6 refills | Status: DC
Start: 2021-02-21 — End: 2021-03-13

## 2021-03-05 ENCOUNTER — Ambulatory Visit (INDEPENDENT_AMBULATORY_CARE_PROVIDER_SITE_OTHER): Payer: Medicare HMO | Admitting: Internal Medicine

## 2021-03-05 ENCOUNTER — Encounter: Payer: Self-pay | Admitting: Internal Medicine

## 2021-03-05 ENCOUNTER — Other Ambulatory Visit: Payer: Self-pay

## 2021-03-05 VITALS — BP 174/100 | HR 87 | Temp 97.4°F | Resp 17 | Ht 71.0 in | Wt 221.0 lb

## 2021-03-05 DIAGNOSIS — R1084 Generalized abdominal pain: Secondary | ICD-10-CM

## 2021-03-05 DIAGNOSIS — M546 Pain in thoracic spine: Secondary | ICD-10-CM

## 2021-03-05 DIAGNOSIS — R109 Unspecified abdominal pain: Secondary | ICD-10-CM

## 2021-03-05 DIAGNOSIS — I10 Essential (primary) hypertension: Secondary | ICD-10-CM | POA: Diagnosis not present

## 2021-03-05 LAB — POCT URINALYSIS DIP (MANUAL ENTRY)
Bilirubin, UA: NEGATIVE
Blood, UA: NEGATIVE
Glucose, UA: 500 mg/dL — AB
Ketones, POC UA: NEGATIVE mg/dL
Leukocytes, UA: NEGATIVE
Nitrite, UA: NEGATIVE
Protein Ur, POC: NEGATIVE mg/dL
Spec Grav, UA: >=1.03 — AB (ref 1.010–1.025)
Urobilinogen, UA: 0.2 E.U./dL
pH, UA: 5 (ref 5.0–8.0)

## 2021-03-05 NOTE — Progress Notes (Signed)
Subjective:    Patient ID: Brandon Stone, male    DOB: 1946-06-22, 75 y.o.   MRN: [id number]  DOS:  03/05/2021 Type of visit - description: Acute  Symptoms a started yesterday very early in the morning, he was awake by the pain , it  has gradually gotten worse. The pain is located at the right mid back. Does not radiate down, denies any lower extremity paresthesias. The pain at times is excruciating, decreased with heating pad, not much worse with moving his torso but is worse when he lays down and decreases by walking.  In addition, he feels somewhat bloated and has nausea. Last bowel movement was yesterday, denies vomiting, diarrhea, blood in the stools. Denies cough, difficulty breathing or chest pain  Denies dysuria or gross hematuria   BP Readings from Last 3 Encounters:  03/05/21 (!) 174/100  02/13/21 136/72  10/17/20 (!) 144/82     Review of Systems See above   Past Medical History:  Diagnosis Date   Anginal pain ([facility])    Arthritis    Barrett's esophagus    BPH (benign prostatic hypertrophy)    CAD (coronary artery disease)     a. CABG 1991;  b.  [facility];  c.  S/P PTCA  stents to SVG ot OM last in 2003;  d.  Redo CABG x 2 in 2004 (VG->OM->LCX, LRA->PDA);  e. 12/2011 Cath: 3vd, sev LM into LCX dzs, VG->OM 100, LIMA->DIAG->LAD patent, Rad Art->PDA patent, EF 45%, Med Rx.   Diverticulosis    DM (diabetes mellitus) ([facility])    Dyslipidemia    Dyspnea    Gallstones    GERD (gastroesophageal reflux disease)    Gout    History of hiatal hernia    HTN (hypertension)    Hyperlipidemia    Leukemia in remission ([facility])    Psoriasis    Rheumatic fever    as a child   S/P coronary artery stent placement 1998   TIA (transient ischemic attack)    FOUND ON MRI  PER PATIENT-     NO PROBLEM    Past Surgical History:  Procedure Laterality Date   APPENDECTOMY     CHOLECYSTECTOMY N/A 09/18/2016   Procedure: LAPAROSCOPIC CHOLECYSTECTOMY WITH INTRAOPERATIVE CHOLANGIOGRAM;   Surgeon: Jackolyn Confer, MD;  Location: [facility];  Service: General;  Laterality: N/A;   COLONOSCOPY  07/2013   CORONARY ANGIOPLASTY     STENTS   CORONARY ARTERY BYPASS GRAFT     1991 (LIMA to LAD/Diag;  SVG to RCA;  SVG to OM)   CORONARY ARTERY BYPASS GRAFT     2004 (L radial to PDA; SVG to OM1/ distal LCx)   TONSILLECTOMY AND ADENOIDECTOMY     UMBILICAL HERNIA REPAIR      Allergies as of 03/05/2021       Reactions   Other Hives   Intolerance to strong pain medications Rash to opsite and tegaderm tape.   Rosuvastatin    Nausea, abdominal discomfort   Ticlopidine Hcl Hives, Itching   Black Pepper [piper] Palpitations   HEART PALPITATIONS HEART PALPITATIONS   Codeine Palpitations, Other (See Comments)   Wild dreams, palpitations "wild dreams"        Medication List        Accurate as of March 05, 2021 11:59 PM. If you have any questions, ask your nurse or doctor.          acetaminophen 325 MG tablet Commonly known as: TYLENOL Take 325 mg by  mouth every 6 (six) hours as needed for mild pain.   allopurinol 300 MG tablet Commonly known as: ZYLOPRIM TAKE 1 TABLET EVERY DAY   amLODipine 5 MG tablet Commonly known as: NORVASC TAKE 1 TABLET EVERY DAY   aspirin EC 81 MG tablet Take 81 mg by mouth daily.   atorvastatin 40 MG tablet Commonly known as: LIPITOR Take 1 tablet (40 mg total) by mouth daily.   blood glucose meter kit and supplies Dispense based on patient and insurance preference. Use up to four times daily as directed. (FOR ICD-10 E10.9, E11.9).   doxycycline 100 MG capsule Commonly known as: VIBRAMYCIN Take 1 capsule (100 mg total) by mouth 2 (two) times daily.   glipiZIDE 5 MG 24 hr tablet Commonly known as: GLUCOTROL XL Take 1 tablet (5 mg total) by mouth daily with breakfast. Increase to 2 tablets after one week What changed:  how much to take additional instructions   icosapent Ethyl 1 g capsule Commonly known as: Vascepa Take 2  capsules (2 g total) by mouth 2 (two) times daily.   isosorbide mononitrate 60 MG 24 hr tablet Commonly known as: IMDUR TAKE 1 TABLET EVERY DAY   loratadine 10 MG tablet Commonly known as: CLARITIN Take 10 mg by mouth daily as needed for allergies.   metFORMIN 500 MG tablet Commonly known as: GLUCOPHAGE Take 1 tablet (500 mg total) by mouth 2 (two) times daily with a meal.   metoprolol succinate 25 MG 24 hr tablet Commonly known as: TOPROL-XL TAKE 1 TABLET IN THE MORNING  AND AT BEDTIME   MULTIPLE VITAMIN-FOLIC ACID PO Take 1 tablet by mouth daily. Contains 161 mg folic acid   nitroGLYCERIN 0.4 MG SL tablet Commonly known as: NITROSTAT Place 1 tablet (0.4 mg total) under the tongue every 5 (five) minutes as needed for chest pain.   omeprazole 20 MG capsule Commonly known as: PRILOSEC Take 1 capsule (20 mg total) by mouth 2 (two) times daily.   potassium chloride SA 20 MEQ tablet Commonly known as: KLOR-CON TAKE 1 TABLET EVERY DAY   Repatha SureClick 096 MG/ML Soaj Generic drug: Evolocumab INJECT 1 PEN INTO THE SKIN EVERY 14 (FOURTEEN) DAYS.   triamcinolone cream 0.1 % Commonly known as: KENALOG Apply 1 application topically 2 (two) times daily. Use as needed for dry or itchy skin   valsartan-hydrochlorothiazide 160-25 MG tablet Commonly known as: DIOVAN-HCT TAKE 1 TABLET EVERY DAY           Objective:   Physical Exam Skin:       BP (!) 174/100 (BP Location: Right Arm, Patient Position: Sitting, Cuff Size: Normal)   Pulse 87   Temp (!) 97.4 F (36.3 C)   Resp 17   Ht 5' 11" (1.803 m)   Wt 221 lb (100.2 kg)   SpO2 97%   BMI 30.82 kg/m  General:   Well developed, NAD, BMI noted.  HEENT:  Normocephalic . Face symmetric, atraumatic Lungs:  CTA B Normal respiratory effort, no intercostal retractions, no accessory muscle use. Heart: RRR,  no murmur.  Abdomen:  Not distended, minimal discomfort if any throughout.  Good bowel sounds, no bruit. Skin:  No rash MSK: No TTP at the thoracic spine Lower extremities: no pretibial edema bilaterally  Neurologic:  alert & oriented X3.  Speech normal, gait appropriate for age and unassisted Psych--  Cognition and judgment appear intact.  Cooperative with normal attention span and concentration.  Behavior appropriate. No anxious or depressed appearing.  Assessment     75 year old gentleman, PMH includes CLL, DM, HTN, hyperlipidemia, CAD, AAA (3.4 cm per ultrasound 10/31/2019), history of subdural hematoma, s/p appendectomy, cholecystectomy, presents with  Right-sided midthoracic pain, abdominal discomfort: Symptoms as described above, etiology not completely clear, exam is benign, U dip negative. Plan: UA urine culture Get a CT abdomen and pelvis to rule out intra-abdominal etiology since it is not completely clear if this is a MSK issue. ER if symptoms severe HTN: Upon arrival blood pressure was 174/100, I rechecked manually: 160/90.  At home is typically well controlled in the 130s.  No change, continue monitoring.  This visit occurred during the SARS-CoV-2 public health emergency.  Safety protocols were in place, including screening questions prior to the visit, additional usage of staff PPE, and extensive cleaning of exam room while observing appropriate contact time as indicated for disinfecting solutions.

## 2021-03-05 NOTE — Patient Instructions (Signed)
Proceed with a CAT scan  Drink plenty of fluids  If you have severe symptoms, fever, chills, rash: Call or go to the ER  Check the  blood pressure   daily BP GOAL is between 110/65 and  135/85. If it is consistently higher or lower, let me know

## 2021-03-06 ENCOUNTER — Ambulatory Visit (HOSPITAL_BASED_OUTPATIENT_CLINIC_OR_DEPARTMENT_OTHER)
Admission: RE | Admit: 2021-03-06 | Discharge: 2021-03-06 | Disposition: A | Discharge status: Home / Self Care | Payer: Medicare HMO | Source: Ambulatory Visit | Attending: Internal Medicine | Admitting: Internal Medicine

## 2021-03-06 ENCOUNTER — Other Ambulatory Visit (HOSPITAL_BASED_OUTPATIENT_CLINIC_OR_DEPARTMENT_OTHER): Payer: Medicare HMO

## 2021-03-06 DIAGNOSIS — K760 Fatty (change of) liver, not elsewhere classified: Secondary | ICD-10-CM | POA: Diagnosis not present

## 2021-03-06 DIAGNOSIS — R1084 Generalized abdominal pain: Secondary | ICD-10-CM | POA: Insufficient documentation

## 2021-03-06 DIAGNOSIS — K449 Diaphragmatic hernia without obstruction or gangrene: Secondary | ICD-10-CM | POA: Diagnosis not present

## 2021-03-06 DIAGNOSIS — N281 Cyst of kidney, acquired: Secondary | ICD-10-CM | POA: Diagnosis not present

## 2021-03-06 DIAGNOSIS — I700 Atherosclerosis of aorta: Secondary | ICD-10-CM | POA: Diagnosis not present

## 2021-03-06 LAB — URINE CULTURE
MICRO NUMBER:: 12366930
SPECIMEN QUALITY:: ADEQUATE

## 2021-03-07 ENCOUNTER — Telehealth: Payer: Self-pay | Admitting: Family Medicine

## 2021-03-07 MED ORDER — AMOXICILLIN 875 MG PO TABS: 875.0000 mg | ORAL_TABLET | Freq: Two times a day (BID) | ORAL | 0 refills | Status: DC

## 2021-03-07 NOTE — Telephone Encounter (Signed)
Call patient back.  He was concerned that glipizide might be causing his back pain.  However, he states he just got a phone call that he actually has a urinary tract infection, being treated by Dr. Larose Kells.  I advised patient that glipizide does not typically cause backache so I think the UTI makes more sense.  He will let me know if symptoms continue while on glipizide after his UTI is treated

## 2021-03-07 NOTE — Addendum Note (Signed)
Addended byDamita Dunnings D on: 03/07/2021 09:33 AM   Modules accepted: Orders

## 2021-03-07 NOTE — Telephone Encounter (Signed)
Pt called to ask if he could get off his glipiZIDE (GLUCOTROL XL) 5 MG 24 hr tablet medicine. He states that ever since he got on it he has been in pain. He also states that his pillsf or his sugar levels are not working. He would like someone to call him back. Please advice.

## 2021-03-07 NOTE — Telephone Encounter (Signed)
Patient seen on 9/13 with Dr. Larose Kells for flank/back pain-CT scan normal-related to muscular issue. Could you please advise on patient's medication question relating to his pain?

## 2021-03-09 ENCOUNTER — Other Ambulatory Visit: Payer: Self-pay | Admitting: Family Medicine

## 2021-03-13 ENCOUNTER — Encounter: Payer: Self-pay | Admitting: Family Medicine

## 2021-03-13 DIAGNOSIS — E119 Type 2 diabetes mellitus without complications: Secondary | ICD-10-CM

## 2021-03-13 MED ORDER — GLIPIZIDE ER 10 MG PO TB24: 10.0000 mg | ORAL_TABLET | Freq: Every day | ORAL | 3 refills | Status: DC

## 2021-03-13 MED ORDER — NITROGLYCERIN 0.4 MG SL SUBL
0.4000 mg | SUBLINGUAL_TABLET | SUBLINGUAL | 3 refills | Status: AC | PRN
Start: 2021-03-13 — End: ?

## 2021-04-13 ENCOUNTER — Other Ambulatory Visit: Payer: Self-pay | Admitting: Internal Medicine

## 2021-04-13 DIAGNOSIS — I10 Essential (primary) hypertension: Secondary | ICD-10-CM

## 2021-04-25 ENCOUNTER — Other Ambulatory Visit: Payer: Self-pay | Admitting: *Deleted

## 2021-04-25 MED ORDER — TRUEPLUS LANCETS 33G MISC: 1 refills | Status: DC

## 2021-04-25 MED ORDER — TRUE METRIX AIR GLUCOSE METER W/DEVICE KIT: PACK | 0 refills | Status: DC

## 2021-04-25 MED ORDER — TRUE METRIX BLOOD GLUCOSE TEST VI STRP
ORAL_STRIP | 1 refills | Status: DC
Start: 2021-04-25 — End: 2021-12-25

## 2021-05-14 ENCOUNTER — Other Ambulatory Visit: Payer: Self-pay

## 2021-05-14 ENCOUNTER — Telehealth (INDEPENDENT_AMBULATORY_CARE_PROVIDER_SITE_OTHER): Payer: Medicare HMO | Admitting: Medical

## 2021-05-14 VITALS — HR 88 | Temp 98.4°F

## 2021-05-14 DIAGNOSIS — R051 Acute cough: Secondary | ICD-10-CM | POA: Diagnosis not present

## 2021-05-14 DIAGNOSIS — J40 Bronchitis, not specified as acute or chronic: Secondary | ICD-10-CM

## 2021-05-14 MED ORDER — ALBUTEROL SULFATE HFA 108 (90 BASE) MCG/ACT IN AERS: 2.0000 | INHALATION_SPRAY | Freq: Four times a day (QID) | RESPIRATORY_TRACT | 0 refills | Status: DC | PRN

## 2021-05-14 MED ORDER — AZITHROMYCIN 250 MG PO TABS: ORAL_TABLET | ORAL | 0 refills | Status: AC

## 2021-05-14 MED ORDER — BENZONATATE 100 MG PO CAPS: 100.0000 mg | ORAL_CAPSULE | Freq: Three times a day (TID) | ORAL | 0 refills | Status: DC | PRN

## 2021-05-14 NOTE — Patient Instructions (Addendum)
You appear to have bronchitis. Rest hydrate and tylenol for fever. I am prescribing cough medicine benzonatate, azithromycin antibiotic., flonase nasal spray for nasal congestion and albuterol inhaler for wheezing.  You should gradually get better. If not then notify us and would recommend a chest xray and in person visit.  If signs and symptoms worsen or change over long holiday then UC or ED evaluation.  Follow up in 7-10 days or as needed

## 2021-05-14 NOTE — Progress Notes (Signed)
   Subjective:    Patient ID: Brandon Stone, male    DOB: 1945/10/01, 75 y.o.   MRN: [id number]  HPI  Virtual Visit via Telephone Note  I connected with Brandon Stone on 05/14/21 at 10:40 AM EST by telephone and verified that I am speaking with the correct person using two identifiers.  Location: Patient: home Provider: office     I discussed the limitations, risks, security and privacy concerns of performing an evaluation and management service by telephone and the availability of in person appointments. I also discussed with the patient that there may be a patient responsible charge related to this service. The patient expressed understanding and agreed to proceed.   History of Present Illness:  Pt had cough for about one week. Pt has been taking robitussin. Pt states ough is dry. Not bringing up any mucus. No fever, no chills or sweats. No myalgias.   Pt has not taking covid test.  Pt not smoker. Prior smoked only 75 yo to 75 yo.   Pt states the day that he started to cough he remembers breatthng in some saw dust. He cough some that day. Then following 2 days he started to cough up some thick pasty mucus but only for 2 days. He clarifies that not dry cough all the time as very 2 days will buring up some mucus.  No wheezing but 2 days ago was wheezing.     Observations/Objective:  General-no acute distress, pleasant, oriented. Lungs- on inspection lungs appear unlabored. Neck- no tracheal deviation or jvd on inspection. Neuro- gross motor function appears intact.    Assessment and Plan: You appear to have bronchitis. Rest hydrate and tylenol for fever. I am prescribing cough medicine benzonatate, azithromycin antibiotic., flonase nasal spray for nasal congestion and albuterol inhaler for wheezing.  You should gradually get better. If not then notify us and would recommend a chest xray and in person visit.  If signs and symptoms worsen or change over long holiday then UC  or ED evaluation.  Follow up in 7-10 days or as needed   Mackie Pai, PA-C   Follow Up Instructions:    I discussed the assessment and treatment plan with the patient. The patient was provided an opportunity to ask questions and all were answered. The patient agreed with the plan and demonstrated an understanding of the instructions.   The patient was advised to call back or seek an in-person evaluation if the symptoms worsen or if the condition fails to improve as anticipated.  Time spent with patient today was  20 minutes which consisted of chart review, discussing diagnosis, work up treatment and documentation.    Mackie Pai, PA-C   Review of Systems  Constitutional:  Negative for diaphoresis, fatigue and fever.  HENT:  Positive for congestion. Negative for ear pain, sinus pressure and sore throat.   Respiratory:  Positive for cough and wheezing. Negative for shortness of breath.        Wheezing earlier in week.  Cardiovascular:  Negative for chest pain and palpitations.  Gastrointestinal:  Negative for abdominal pain.  Neurological:  Negative for dizziness and light-headedness.  Hematological:  Negative for adenopathy. Does not bruise/bleed easily.  Psychiatric/Behavioral:  Negative for confusion.       Objective:   Physical Exam        Assessment & Plan:

## 2021-05-21 ENCOUNTER — Ambulatory Visit (HOSPITAL_BASED_OUTPATIENT_CLINIC_OR_DEPARTMENT_OTHER)
Admission: RE | Admit: 2021-05-21 | Discharge: 2021-05-21 | Disposition: A | Discharge status: Home / Self Care | Payer: Medicare HMO | Source: Ambulatory Visit | Attending: Medical | Admitting: Medical

## 2021-05-21 ENCOUNTER — Ambulatory Visit (INDEPENDENT_AMBULATORY_CARE_PROVIDER_SITE_OTHER): Payer: Medicare HMO | Admitting: Medical

## 2021-05-21 ENCOUNTER — Encounter: Payer: Self-pay | Admitting: Medical

## 2021-05-21 ENCOUNTER — Other Ambulatory Visit: Payer: Self-pay

## 2021-05-21 VITALS — BP 148/77 | HR 84 | Resp 18 | Ht 71.0 in | Wt 223.0 lb

## 2021-05-21 DIAGNOSIS — R0989 Other specified symptoms and signs involving the circulatory and respiratory systems: Secondary | ICD-10-CM | POA: Diagnosis not present

## 2021-05-21 DIAGNOSIS — E119 Type 2 diabetes mellitus without complications: Secondary | ICD-10-CM

## 2021-05-21 DIAGNOSIS — J40 Bronchitis, not specified as acute or chronic: Secondary | ICD-10-CM | POA: Insufficient documentation

## 2021-05-21 DIAGNOSIS — R059 Cough, unspecified: Secondary | ICD-10-CM

## 2021-05-21 DIAGNOSIS — I700 Atherosclerosis of aorta: Secondary | ICD-10-CM | POA: Diagnosis not present

## 2021-05-21 MED ORDER — BUDESONIDE-FORMOTEROL FUMARATE 160-4.5 MCG/ACT IN AERO: INHALATION_SPRAY | RESPIRATORY_TRACT | 12 refills | Status: DC

## 2021-05-21 NOTE — Progress Notes (Signed)
Subjective:    Patient ID: Brandon Stone, male    DOB: 02/26/1946, 75 y.o.   MRN: [id number]  HPI  Pt states this Saturday he started to wheeze. He had virtual visit on 05-14-2021. Below in "  hpi.  "Pt had cough for about one week. Pt has been taking robitussin. Pt states ough is dry. Not bringing up any mucus. No fever, no chills or sweats. No myalgias.    Pt has not taking covid test.  Pt not smoker. Prior smoked only 75 yo to 75 yo.    Pt states the day that he started to cough he remembers breatthng in some saw dust. He cough some that day. Then following 2 days he started to cough up some thick pasty mucus but only for 2 days. He clarifies that not dry cough all the time as very 2 days will buring up some mucus.   No wheezing but 2 days ago was wheezing."    A/P from last visit I ".  "You appear to have bronchitis. Rest hydrate and tylenol for fever. I am prescribing cough medicine benzonatate, azithromycin antibiotic., flonase nasal spray for nasal congestion and albuterol inhaler for wheezing.   You should gradually get better. If not then notify us and would recommend a chest xray and in person visit.   If signs and symptoms worsen or change over long holiday then UC or ED evaluation.   Follow up in 7-10 days or as needed "  Pt notes this Saturday he began to wheeze with productive mucus light yellow in color.  Remote hx of smoking 75 yo-75 yo. Most half a pack a day.  Does get bronchitis every year.  Pt never had covid vaccine. Pt never did covid test. He never had fever, no chills, no sweats or body aches.   Pt notes day symptoms started exposed to saw dust and his smoke exposure to  neighbors burnt trash.   Pt is diabetic. He is on metformin and glipizide. Last a1c was 9.3.  Pt never tested for covid. Did explain he could check now. But in future since not vaccinated do recommend checking early on.   Review of Systems  Constitutional:  Negative for chills,  fatigue and fever.  HENT:  Negative for congestion, ear pain, postnasal drip, sinus pressure, sneezing and sore throat.   Respiratory:  Positive for cough and wheezing. Negative for shortness of breath.   Cardiovascular:  Negative for chest pain and palpitations.  Gastrointestinal:  Negative for abdominal pain.  Genitourinary:  Negative for dysuria.  Musculoskeletal:  Negative for back pain, joint swelling and myalgias.  Skin:  Negative for rash.  Neurological:  Negative for dizziness, weakness, light-headedness, numbness and headaches.  Psychiatric/Behavioral:  Negative for agitation, confusion and dysphoric mood.     Past Medical History:  Diagnosis Date   Anginal pain ([place])    Arthritis    Barrett's esophagus    BPH (benign prostatic hypertrophy)    CAD (coronary artery disease)     a. CABG 1991;  b.  [place];  c.  S/P PTCA  stents to SVG ot OM last in 2003;  d.  Redo CABG x 2 in 2004 (VG->OM->LCX, LRA->PDA);  e. 12/2011 Cath: 3vd, sev LM into LCX dzs, VG->OM 100, LIMA->DIAG->LAD patent, Rad Art->PDA patent, EF 45%, Med Rx.   Diverticulosis    DM (diabetes mellitus) ([place])    Dyslipidemia    Dyspnea    Gallstones  GERD (gastroesophageal reflux disease)    Gout    History of hiatal hernia    HTN (hypertension)    Hyperlipidemia    Leukemia in remission (HCC)    Psoriasis    Rheumatic fever    as a child   S/P coronary artery stent placement 1998   TIA (transient ischemic attack)    FOUND ON MRI  PER PATIENT-     NO PROBLEM     Social History   Socioeconomic History   Marital status: Married    Spouse name: Not on file   Number of children: 2   Years of education: Not on file   Highest education level: Not on file  Occupational History   Occupation: retired  Tobacco Use   Smoking status: Former    Years: 8.00    Types: Cigarettes    Quit date: 06/24/1971    Years since quitting: 49.9   Smokeless tobacco: Never  Substance and Sexual Activity   Alcohol use: No    Drug use: No   Sexual activity: Yes  Other Topics Concern   Not on file  Social History Narrative   Not on file   Social Determinants of Health   Financial Resource Strain: Low Risk    Difficulty of Paying Living Expenses: Not hard at all  Food Insecurity: No Food Insecurity   Worried About Charity fundraiser in the Last Year: Never true   [place] in the Last Year: Never true  Transportation Needs: No Transportation Needs   Lack of Transportation (Medical): No   Lack of Transportation (Non-Medical): No  Physical Activity: Insufficiently Active   Days of Exercise per Week: 3 days   Minutes of Exercise per Session: 30 min  Stress: No Stress Concern Present   Feeling of Stress : Not at all  Social Connections: Moderately Integrated   Frequency of Communication with Friends and Family: More than three times a week   Frequency of Social Gatherings with Friends and Family: More than three times a week   Attends Religious Services: More than 4 times per year   Active Member of [employer] or Organizations: No   Attends Archivist Meetings: Never   Marital Status: Married  Human resources officer Violence: Not At Risk   Fear of Current or Ex-Partner: No   Emotionally Abused: No   Physically Abused: No   Sexually Abused: No    Past Surgical History:  Procedure Laterality Date   APPENDECTOMY     CHOLECYSTECTOMY N/A 09/18/2016   Procedure: LAPAROSCOPIC CHOLECYSTECTOMY WITH INTRAOPERATIVE CHOLANGIOGRAM;  Surgeon: Jackolyn Confer, MD;  Location: [facility];  Service: General;  Laterality: N/A;   COLONOSCOPY  07/2013   CORONARY ANGIOPLASTY     STENTS   CORONARY ARTERY BYPASS GRAFT     1991 (LIMA to LAD/Diag;  SVG to RCA;  SVG to OM)   CORONARY ARTERY BYPASS GRAFT     2004 (L radial to PDA; SVG to OM1/ distal LCx)   TONSILLECTOMY AND ADENOIDECTOMY     UMBILICAL HERNIA REPAIR      Family History  Problem Relation Age of Onset   Heart disease Mother    Hypertension Mother     Hyperlipidemia Mother    Diabetes Mother    Skin cancer Mother        skin   Heart disease Father    Hypertension Father    Hyperlipidemia Father    Diabetes Father    Stroke Sister  Stroke Brother    Colon cancer Neg Hx     Allergies  Allergen Reactions   Other Hives    Intolerance to strong pain medications  Rash to opsite and tegaderm tape.   Rosuvastatin     Nausea, abdominal discomfort   Ticlopidine Hcl Hives and Itching   Black Pepper [[place]per] Palpitations    HEART PALPITATIONS HEART PALPITATIONS   Codeine Palpitations and Other (See Comments)    Wild dreams, palpitations "wild dreams"    Current Outpatient Medications on File Prior to Visit  Medication Sig Dispense Refill   acetaminophen (TYLENOL) 325 MG tablet Take 325 mg by mouth every 6 (six) hours as needed for mild pain.     albuterol (VENTOLIN HFA) 108 (90 Base) MCG/ACT inhaler Inhale 2 puffs into the lungs every 6 (six) hours as needed. 18 g 0   allopurinol (ZYLOPRIM) 300 MG tablet TAKE 1 TABLET EVERY DAY 90 tablet 2   amLODipine (NORVASC) 5 MG tablet TAKE 1 TABLET EVERY DAY 90 tablet 3   aspirin EC 81 MG tablet Take 81 mg by mouth daily.     atorvastatin (LIPITOR) 40 MG tablet TAKE 1 TABLET EVERY DAY 90 tablet 2   benzonatate (TESSALON) 100 MG capsule Take 1 capsule (100 mg total) by mouth 3 (three) times daily as needed for cough. 30 capsule 0   blood glucose meter kit and supplies Dispense based on patient and insurance preference. Use up to four times daily as directed. (FOR ICD-10 E10.9, E11.9). 1 each 0   Blood Glucose Monitoring Suppl (TRUE METRIX AIR GLUCOSE METER) w/Device KIT Use as directed once a day.  Dx code: E11.9 1 kit 0   glipiZIDE (GLUCOTROL XL) 10 MG 24 hr tablet Take 1 tablet (10 mg total) by mouth daily with breakfast. 90 tablet 3   glucose blood (TRUE METRIX BLOOD GLUCOSE TEST) test strip Use as directed once a day.  Dx code: E11.9 100 each 1   icosapent Ethyl (VASCEPA) 1 g capsule Take  2 capsules (2 g total) by mouth 2 (two) times daily. 360 capsule 3   isosorbide mononitrate (IMDUR) 60 MG 24 hr tablet TAKE 1 TABLET EVERY DAY 90 tablet 3   loratadine (CLARITIN) 10 MG tablet Take 10 mg by mouth daily as needed for allergies.     metFORMIN (GLUCOPHAGE) 500 MG tablet TAKE 1 TABLET TWICE DAILY WITH MEALS 180 tablet 1   metoprolol succinate (TOPROL-XL) 25 MG 24 hr tablet TAKE 1 TABLET IN THE MORNING  AND AT BEDTIME 180 tablet 3   MULTIPLE VITAMIN-FOLIC ACID PO Take 1 tablet by mouth daily. Contains 220 mg folic acid     nitroGLYCERIN (NITROSTAT) 0.4 MG SL tablet Place 1 tablet (0.4 mg total) under the tongue every 5 (five) minutes as needed for chest pain. 25 tablet 3   nitroGLYCERIN (NITROSTAT) 0.4 MG SL tablet Place 1 tablet (0.4 mg total) under the tongue every 5 (five) minutes as needed for chest pain. 25 tablet 3   omeprazole (PRILOSEC) 20 MG capsule Take 1 capsule (20 mg total) by mouth 2 (two) times daily. 180 capsule 3   potassium chloride SA (KLOR-CON) 20 MEQ tablet TAKE 1 TABLET EVERY DAY 90 tablet 1   REPATHA SURECLICK 254 MG/ML SOAJ INJECT 1 PEN INTO THE SKIN EVERY 14 (FOURTEEN) DAYS. 6 mL 3   triamcinolone cream (KENALOG) 0.1 % Apply 1 application topically 2 (two) times daily. Use as needed for dry or itchy skin 30 g 2  TRUEplus Lancets 33G MISC Use as directed once a day.  Dx code: E11.9 100 each 1   valsartan-hydrochlorothiazide (DIOVAN-HCT) 160-25 MG tablet TAKE 1 TABLET EVERY DAY 90 tablet 3   No current facility-administered medications on file prior to visit.    BP (!) 148/77   Pulse 84   Resp 18   Ht '5\' 11"'  (1.803 m)   Wt 223 lb (101.2 kg)   SpO2 95%   BMI 31.10 kg/m        Objective:   Physical Exam  General Mental Status- Alert. General Appearance- Not in acute distress.   Skin General: Color- Normal Color. Moisture- Normal Moisture.  Neck Carotid Arteries- Normal color. Moisture- Normal Moisture. No carotid bruits. No JVD.  Chest and  Lung Exam Auscultation: Breath Sounds:-Normal.  Cardiovascular Auscultation:Rythm- Regular. Murmurs & Other Heart Sounds:Auscultation of the heart reveals- No Murmurs.  Abdomen Inspection:-Inspeection Normal. Palpation/Percussion:Note:No mass. Palpation and Percussion of the abdomen reveal- Non Tender, Non Distended + BS, no rebound or guarding.  Neurologic Cranial Nerve exam:- CN III-XII intact(No nystagmus), symmetric smile. Strength:- 5/5 equal and symmetric strength both upper and lower extremities.       Assessment & Plan:   Patient Instructions  Bronchitis type symptoms with some wheezing at home itermittently at home. On exam no wheezing presently. Good 02 at 98%.  Will prescribe symbicort inhaler. Get cxr. Decide if second round antibiotic needed.  Will get cmp and a1c today. Assess if diabetes controlled. If breathing worsens taper prednisone or medrol option but would need to prescribe with caution as can increase sugar levels.  For cough can continue benzonatate. If cough keeping you up at night would switch to hycodan/narcotic based cough syrup.  Follow up in 7-10 days or sooner if needed.   Mackie Pai, PA-C

## 2021-05-21 NOTE — Patient Instructions (Addendum)
Bronchitis type symptoms with some wheezing at home itermittently at home. On exam no wheezing presently. Good 02 at 98%.  Will prescribe symbicort inhaler. Get cxr. Decide if second round antibiotic needed.  Will get cmp and a1c today. Assess if diabetes controlled. If breathing worsens taper prednisone or medrol option but would need to prescribe with caution as can increase sugar levels.  For cough can continue benzonatate. If cough keeping you up at night would switch to hycodan/narcotic based cough syrup.  Follow up in 7-10 days or sooner if needed.

## 2021-05-22 ENCOUNTER — Encounter: Payer: Self-pay | Admitting: Medical

## 2021-05-22 ENCOUNTER — Other Ambulatory Visit (INDEPENDENT_AMBULATORY_CARE_PROVIDER_SITE_OTHER): Payer: Medicare HMO

## 2021-05-22 DIAGNOSIS — E119 Type 2 diabetes mellitus without complications: Secondary | ICD-10-CM | POA: Diagnosis not present

## 2021-05-22 NOTE — Addendum Note (Signed)
Addended by: Manuela Schwartz on: 05/22/2021 02:28 PM   Modules accepted: Orders

## 2021-05-23 LAB — HEMOGLOBIN A1C: Hgb A1c MFr Bld: 8.1 % — ABNORMAL HIGH (ref 4.6–6.5)

## 2021-05-28 NOTE — Progress Notes (Addendum)
[facility] [address] [place], [place] [address] [phone]  Date:  06/03/2021   Name:  Brandon Stone   DOB:  27-Jul-1945   MRN:  [id number]  PCP:  Darreld Mclean, MD    Chief Complaint: 7-10 day follow up (Seen by ES on 05/21/21 for Bronchitis- Rx Symbicort inhaler and Tessalon Pear[place], CXR was clear./Pt says he is noticing some improvements, but is still coughing at night )   History of Present Illness:  Brandon Stone is a 75 y.o. very pleasant male patient who presents with the following:  Short term follow-up today.  CPE 8/22 History of diabetes, CLL, hyperlipidemia, hypertension, CAD, subdural hematoma March 2020  Seen by Percell Miller virtually on 11/22 and again 11/29 At his first visit he had a chest x-ray which was negative, he was given azithromycin Second visit:  Bronchitis type symptoms with some wheezing at home itermittently at home. On exam no wheezing presently. Good 02 at 98%. Will prescribe symbicort inhaler. Get cxr. Decide if second round antibiotic needed. Negative chest x-ray 11/22 Last A1c showed improvement although still above goal Lab Results  Component Value Date   HGBA1C 8.1 (H) 05/22/2021   At this time Brandon Stone notes some improvement of his symptoms but he continues to cough He was treated with a zpack starting on 11/22-no further antibiotics used.  He has not yet used an oral steroid  He still does have a productive cough He feels tired much of the time - he thinks due to coughing all day He starts to cough more towards the end of the day At night he is sleeping well and not coughing Never ran a fever No aches or chills He does feel like he is slowly making progress   He is not sure if the inhaler is helping him or not  He is not using an oral steroid- just the inhaled steroid  His max glucose is about 150- 170  He is currently taking metformin and glipizide He notes  difficulty with losing weight-obesity contributes to diabetes  No history of pancreatitis No personal or family history of thyroid cancer or MEN syndrome We discussed using a GLP-1, he is open to this idea     Patient Active Problem List   Diagnosis Date Noted   AAA (abdominal aortic aneurysm) 10/31/2019   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 CABG 1991;  b.  [place];  c.  S/P PTCA  stents to SVG ot OM last in 2003;  d.  Redo CABG x 2 in 2004 (VG->OM->LCX, LRA->PDA);  e. 12/2011 Cath: 3vd, sev LM into LCX dzs, VG->OM 100, LIMA->DIAG->LAD patent, Rad Art->PDA patent, EF 45%, Med Rx.   Diverticulosis    DM (diabetes mellitus) ([place])    Dyslipidemia    Dyspnea    Gallstones    GERD (gastroesophageal reflux disease)    Gout    History of hiatal  hernia    HTN (hypertension)    Hyperlipidemia    Leukemia in remission ([place])    Psoriasis    Rheumatic fever    as a child   S/P coronary artery stent placement 1998   TIA (transient ischemic attack)    FOUND ON MRI  PER PATIENT-     NO PROBLEM    Past Surgical History:  Procedure Laterality Date   APPENDECTOMY     CHOLECYSTECTOMY N/A 09/18/2016   Procedure: LAPAROSCOPIC CHOLECYSTECTOMY WITH INTRAOPERATIVE CHOLANGIOGRAM;  Surgeon: Jackolyn Confer, MD;  Location: [facility];  Service: General;  Laterality: N/A;   COLONOSCOPY  07/2013   CORONARY ANGIOPLASTY     STENTS   CORONARY ARTERY BYPASS GRAFT     1991 (LIMA to LAD/Diag;  SVG to RCA;  SVG to OM)    CORONARY ARTERY BYPASS GRAFT     2004 (L radial to PDA; SVG to OM1/ distal LCx)   TONSILLECTOMY AND ADENOIDECTOMY     UMBILICAL HERNIA REPAIR      Social History   Tobacco Use   Smoking status: Former    Years: 8.00    Types: Cigarettes    Quit date: 06/24/1971    Years since quitting: 49.9   Smokeless tobacco: Never  Substance Use Topics   Alcohol use: No   Drug use: No    Family History  Problem Relation Age of Onset   Heart disease Mother    Hypertension Mother    Hyperlipidemia Mother    Diabetes Mother    Skin cancer Mother        skin   Heart disease Father    Hypertension Father    Hyperlipidemia Father    Diabetes Father    Stroke Sister    Stroke Brother    Colon cancer Neg Hx     Allergies  Allergen Reactions   Other Hives    Intolerance to strong pain medications  Rash to opsite and tegaderm tape.   Rosuvastatin     Nausea, abdominal discomfort   Ticlopidine Hcl Hives and Itching   Black Pepper [[place]] Palpitations    HEART PALPITATIONS HEART PALPITATIONS   Codeine Palpitations and Other (See Comments)    Wild dreams, palpitations "wild dreams"    Medication list has been reviewed and updated.  Current Outpatient Medications on File Prior to Visit  Medication Sig Dispense Refill   acetaminophen (TYLENOL) 325 MG tablet Take 325 mg by mouth every 6 (six) hours as needed for mild pain.     albuterol (VENTOLIN HFA) 108 (90 Base) MCG/ACT inhaler Inhale 2 puffs into the lungs every 6 (six) hours as needed. 18 g 0   allopurinol (ZYLOPRIM) 300 MG tablet TAKE 1 TABLET EVERY DAY 90 tablet 2   amLODipine (NORVASC) 5 MG tablet TAKE 1 TABLET EVERY DAY 90 tablet 3   aspirin EC 81 MG tablet Take 81 mg by mouth daily.     atorvastatin (LIPITOR) 40 MG tablet TAKE 1 TABLET EVERY DAY 90 tablet 2   benzonatate (TESSALON) 100 MG capsule Take 1 capsule (100 mg total) by mouth 3 (three) times daily as needed for cough. 30 capsule 0   budesonide-formoterol  (SYMBICORT) 160-4.5 MCG/ACT inhaler 2 inhalation twice daily 1 each 12   glipiZIDE (GLUCOTROL XL) 10 MG 24 hr tablet Take 1 tablet (10 mg total) by mouth daily with breakfast. 90 tablet 3   glucose blood (TRUE METRIX BLOOD GLUCOSE TEST) test strip Use as directed once a day.  Dx code: E11.9 100 each 1   icosapent Ethyl (VASCEPA) 1 g capsule Take 2 capsules (2 g total) by mouth 2 (two) times daily. 360 capsule 3   isosorbide mononitrate (IMDUR) 60 MG 24 hr tablet TAKE 1 TABLET EVERY DAY 90 tablet 3   loratadine (CLARITIN) 10 MG tablet Take 10 mg by mouth daily as needed for allergies.     metFORMIN (GLUCOPHAGE) 500 MG tablet TAKE 1 TABLET TWICE DAILY WITH MEALS 180 tablet 1   metoprolol succinate (TOPROL-XL) 25 MG 24 hr tablet TAKE 1 TABLET IN THE MORNING  AND AT BEDTIME 180 tablet 3   MULTIPLE VITAMIN-FOLIC ACID PO Take 1 tablet by mouth daily. Contains 010 mg folic acid     nitroGLYCERIN (NITROSTAT) 0.4 MG SL tablet Place 1 tablet (0.4 mg total) under the tongue every 5 (five) minutes as needed for chest pain. 25 tablet 3   omeprazole (PRILOSEC) 20 MG capsule Take 1 capsule (20 mg total) by mouth 2 (two) times daily. 180 capsule 3   potassium chloride SA (KLOR-CON) 20 MEQ tablet TAKE 1 TABLET EVERY DAY 90 tablet 1   REPATHA SURECLICK 272 MG/ML SOAJ INJECT 1 PEN INTO THE SKIN EVERY 14 (FOURTEEN) DAYS. 6 mL 3   triamcinolone cream (KENALOG) 0.1 % Apply 1 application topically 2 (two) times daily. Use as needed for dry or itchy skin 30 g 2   TRUEplus Lancets 33G MISC Use as directed once a day.  Dx code: E11.9 100 each 1   valsartan-hydrochlorothiazide (DIOVAN-HCT) 160-25 MG tablet TAKE 1 TABLET EVERY DAY 90 tablet 3   No current facility-administered medications on file prior to visit.    Review of Systems:  As per HPI- otherwise negative.   Physical Examination: Vitals:   06/03/21 1019  BP: 134/70  Pulse: 86  Resp: 18  Temp: 97.8 F (36.6 C)  SpO2: 98%   Vitals:   06/03/21 1019   Weight: 225 lb (102.1 kg)  Height: 5\' 11"  (1.803 m)   Body mass index is 31.38 kg/m. Ideal Body Weight: Weight in (lb) to have BMI = 25: 178.9  GEN: no acute distress.  Mild obesity, looks well HEENT: Atraumatic, Normocephalic.  Bilateral TM wnl, oropharynx normal.  PEERL,EOMI.   Ears and Nose: No external deformity. CV: RRR, No M/G/R. No JVD. No thrill. No extra heart sounds. PULM: CTA B, no wheezes, crackles, rhonchi. No retractions. No resp. distress. No accessory muscle use. ABD: S, NT, ND, +BS. No rebound. No HSM. EXTR: No c/c/e PSYCH: Normally interactive. Conversant.    Assessment and Plan: Subacute cough - Plan: predniSONE (DELTASONE) 20 MG tablet  Atherosclerosis of native coronary artery of native heart without angina pectoris - Plan: tirzepatide [facility]) 2.5 MG/0.5ML Pen  Type 2 diabetes mellitus with other specified complication, without long-term current use of insulin (HCC) - Plan: tirzepatide (MOUNJARO) 2.5 MG/0.5ML Pen, Basic metabolic panel  Hyperlipidemia, unspecified hyperlipidemia type - Plan: Lipid panel  Patient seen today for follow-up.  He has had a cough now for about 1 month. Chest x-ray on 11/29 was negative We will treat with a conservative regimen of oral prednisone for persistent cough.  Advised patient his blood sugars will likely go up.  If they are going over 300 he will let me know He is also asked to report if not feeling better in a few days  Diabetes complicated by CAD and hyperlipidemia-uncontrolled on current regimen Prescribed Mounjaro for patient, started 2.5 mg.  He will use this once  weekly, we will plan to increase to 5 mg in 1 month.  He is asked to DC glipizide once he increases dose to 5 mg-he will let me know how this works for him  Signed Lamar Blinks, MD  Received labs as below, message to pt  Results for orders placed or performed in visit on 06/03/21  Lipid panel  Result Value Ref Range   Cholesterol 79 0 - 200 mg/dL    Triglycerides 205.0 (H) 0.0 - 149.0 mg/dL   HDL 40.80 >39.00 mg/dL   VLDL 41.0 (H) 0.0 - 40.0 mg/dL   Total CHOL/HDL Ratio 2    NonHDL 51.89   Basic metabolic panel  Result Value Ref Range   Sodium 143 135 - 145 mEq/L   Potassium 3.7 3.5 - 5.1 mEq/L   Chloride 103 96 - 112 mEq/L   CO2 27 19 - 32 mEq/L   Glucose, Bld 163 (H) 70 - 99 mg/dL   BUN 16 6 - 23 mg/dL   Creatinine, Ser 1.03 0.40 - 1.50 mg/dL   GFR 72.31 >60.00 mL/min   Calcium 9.8 8.4 - 10.5 mg/dL  LDL cholesterol, direct  Result Value Ref Range   Direct LDL 23.0 mg/dL

## 2021-06-03 ENCOUNTER — Encounter: Payer: Self-pay | Admitting: Family Medicine

## 2021-06-03 ENCOUNTER — Ambulatory Visit (INDEPENDENT_AMBULATORY_CARE_PROVIDER_SITE_OTHER): Payer: Medicare HMO | Admitting: Family Medicine

## 2021-06-03 VITALS — BP 134/70 | HR 86 | Temp 97.8°F | Resp 18 | Ht 71.0 in | Wt 225.0 lb

## 2021-06-03 DIAGNOSIS — E785 Hyperlipidemia, unspecified: Secondary | ICD-10-CM

## 2021-06-03 DIAGNOSIS — I2510 Atherosclerotic heart disease of native coronary artery without angina pectoris: Secondary | ICD-10-CM | POA: Diagnosis not present

## 2021-06-03 DIAGNOSIS — R052 Subacute cough: Secondary | ICD-10-CM | POA: Diagnosis not present

## 2021-06-03 DIAGNOSIS — E1169 Type 2 diabetes mellitus with other specified complication: Secondary | ICD-10-CM | POA: Diagnosis not present

## 2021-06-03 LAB — BASIC METABOLIC PANEL
BUN: 16 mg/dL (ref 6–23)
CO2: 27 mEq/L (ref 19–32)
Calcium: 9.8 mg/dL (ref 8.4–10.5)
Chloride: 103 mEq/L (ref 96–112)
Creatinine, Ser: 1.03 mg/dL (ref 0.40–1.50)
GFR: 72.31 mL/min (ref >60.00)
Glucose, Bld: 163 mg/dL — ABNORMAL HIGH (ref 70–99)
Potassium: 3.7 mEq/L (ref 3.5–5.1)
Sodium: 143 mEq/L (ref 135–145)

## 2021-06-03 LAB — LDL CHOLESTEROL, DIRECT: Direct LDL: 23 mg/dL

## 2021-06-03 LAB — LIPID PANEL
Cholesterol: 79 mg/dL (ref 0–200)
HDL: 40.8 mg/dL (ref >39.00)
NonHDL: 38.67
Total CHOL/HDL Ratio: 2
Triglycerides: 205 mg/dL — ABNORMAL HIGH (ref 0.0–149.0)
VLDL: 41 mg/dL — ABNORMAL HIGH (ref 0.0–40.0)

## 2021-06-03 MED ORDER — TIRZEPATIDE 2.5 MG/0.5ML ~~LOC~~ SOAJ: 2.5000 mg | SUBCUTANEOUS | 0 refills | Status: DC

## 2021-06-03 MED ORDER — PREDNISONE 20 MG PO TABS: ORAL_TABLET | ORAL | 0 refills | Status: DC

## 2021-06-03 NOTE — Patient Instructions (Signed)
It was great to see you again today, and hope you are feeling well soon.  Take prednisone 20 mg once a day.  This will increase your blood sugars temporarily, this is expected.  Please let me know if your blood sugars go greater than 300 or so.  Also, please alert me if you are not feeling better by the end of the course of prednisone  We will have you start on Mounjaro for diabetes control and also to assist with weight loss.  Start with 2.5 mg once a week.  We will increase to 5 mg after 1 month-please send me a message when you are about due for dose increase  Continue glipizide for first month, will be increased to 5 mg we will plan to stop glipizide  However, if your sugars are dropping too low at any point go ahead and stop glipizide use

## 2021-06-05 ENCOUNTER — Inpatient Hospital Stay: Payer: Medicare HMO | Admitting: Hematology & Oncology

## 2021-06-05 ENCOUNTER — Encounter: Payer: Self-pay | Admitting: Hematology & Oncology

## 2021-06-05 ENCOUNTER — Inpatient Hospital Stay: Payer: Medicare HMO | Attending: Hematology & Oncology

## 2021-06-05 ENCOUNTER — Other Ambulatory Visit: Payer: Self-pay

## 2021-06-05 VITALS — BP 151/91 | HR 77 | Temp 98.1°F | Resp 18 | Wt 225.0 lb

## 2021-06-05 DIAGNOSIS — Z7952 Long term (current) use of systemic steroids: Secondary | ICD-10-CM | POA: Insufficient documentation

## 2021-06-05 DIAGNOSIS — C9110 Chronic lymphocytic leukemia of B-cell type not having achieved remission: Secondary | ICD-10-CM | POA: Diagnosis not present

## 2021-06-05 DIAGNOSIS — J40 Bronchitis, not specified as acute or chronic: Secondary | ICD-10-CM | POA: Insufficient documentation

## 2021-06-05 LAB — CMP (CANCER CENTER ONLY)
ALT: 22 U/L (ref 0–44)
AST: 19 U/L (ref 15–41)
Albumin: 4.4 g/dL (ref 3.5–5.0)
Alkaline Phosphatase: 72 U/L (ref 38–126)
Anion gap: 10 (ref 5–15)
BUN: 27 mg/dL — ABNORMAL HIGH (ref 8–23)
CO2: 31 mmol/L (ref 22–32)
Calcium: 10.3 mg/dL (ref 8.9–10.3)
Chloride: 102 mmol/L (ref 98–111)
Creatinine: 1.16 mg/dL (ref 0.61–1.24)
GFR, Estimated: >60 mL/min (ref >60)
Glucose, Bld: 146 mg/dL — ABNORMAL HIGH (ref 70–99)
Potassium: 3.6 mmol/L (ref 3.5–5.1)
Sodium: 143 mmol/L (ref 135–145)
Total Bilirubin: 0.6 mg/dL (ref 0.3–1.2)
Total Protein: 6.5 g/dL (ref 6.5–8.1)

## 2021-06-05 LAB — CBC WITH DIFFERENTIAL (CANCER CENTER ONLY)
Abs Immature Granulocytes: 0.11 10*3/uL — ABNORMAL HIGH (ref 0.00–0.07)
Basophils Absolute: 0.1 10*3/uL (ref 0.0–0.1)
Basophils Relative: 0 %
Eosinophils Absolute: 0.1 10*3/uL (ref 0.0–0.5)
Eosinophils Relative: 0 %
HCT: 43.4 % (ref 39.0–52.0)
Hemoglobin: 14.6 g/dL (ref 13.0–17.0)
Immature Granulocytes: 1 %
Lymphocytes Relative: 59 %
Lymphs Abs: 13.3 10*3/uL — ABNORMAL HIGH (ref 0.7–4.0)
MCH: 30.8 pg (ref 26.0–34.0)
MCHC: 33.6 g/dL (ref 30.0–36.0)
MCV: 91.6 fL (ref 80.0–100.0)
Monocytes Absolute: 1.2 10*3/uL — ABNORMAL HIGH (ref 0.1–1.0)
Monocytes Relative: 5 %
Neutro Abs: 7.9 10*3/uL — ABNORMAL HIGH (ref 1.7–7.7)
Neutrophils Relative %: 35 %
Platelet Count: 252 10*3/uL (ref 150–400)
RBC: 4.74 MIL/uL (ref 4.22–5.81)
RDW: 12.8 % (ref 11.5–15.5)
WBC Count: 22.7 10*3/uL — ABNORMAL HIGH (ref 4.0–10.5)
nRBC: 0 % (ref 0.0–0.2)

## 2021-06-05 LAB — SAVE SMEAR(SSMR), FOR PROVIDER SLIDE REVIEW

## 2021-06-05 LAB — LACTATE DEHYDROGENASE: LDH: 129 U/L (ref 98–192)

## 2021-06-05 MED ORDER — CEFDINIR 300 MG PO CAPS: 600.0000 mg | ORAL_CAPSULE | Freq: Every day | ORAL | 0 refills | Status: DC

## 2021-06-05 NOTE — Progress Notes (Signed)
Hematology and Oncology Follow Up Visit  Brandon Stone [id number] May 02, 1946 75 y.o. 06/05/2021   Principle Diagnosis:  Stage A CLL  Current Therapy:   Observation     Interim History:  Brandon Stone is back for follow-up.  We see him yearly.  Right now, he has his bronchitis.  He is on some prednisone right now.  I think findings be also antibiotic.  He does have CLL.  His white cell count is a little bit.  I think that his immune system probably is compromised so antibiotic I think would be reasonable.  I will send in [place] (600 mg p.o. daily x7 days).  Otherwise, he is doing okay.  He is watch his blood sugars.  He is trying to lose weight.  He now is taking an injectable diabetic medication.  He has had no change in bowel or bladder habits.  He has had no nausea or vomiting.  There has been no leg swelling.  He has had no bleeding.  Thankfully, he has had no problems with COVID.  Currently, I would say his performance status is ECOG 1.     Medications:  Current Outpatient Medications:    acetaminophen (TYLENOL) 325 MG tablet, Take 325 mg by mouth every 6 (six) hours as needed for mild pain., Disp: , Rfl:    albuterol (VENTOLIN HFA) 108 (90 Base) MCG/ACT inhaler, Inhale 2 puffs into the lungs every 6 (six) hours as needed., Disp: 18 g, Rfl: 0   allopurinol (ZYLOPRIM) 300 MG tablet, TAKE 1 TABLET EVERY DAY, Disp: 90 tablet, Rfl: 2   amLODipine (NORVASC) 5 MG tablet, TAKE 1 TABLET EVERY DAY, Disp: 90 tablet, Rfl: 3   aspirin EC 81 MG tablet, Take 81 mg by mouth daily., Disp: , Rfl:    atorvastatin (LIPITOR) 40 MG tablet, TAKE 1 TABLET EVERY DAY, Disp: 90 tablet, Rfl: 2   benzonatate (TESSALON) 100 MG capsule, Take 1 capsule (100 mg total) by mouth 3 (three) times daily as needed for cough., Disp: 30 capsule, Rfl: 0   budesonide-formoterol (SYMBICORT) 160-4.5 MCG/ACT inhaler, 2 inhalation twice daily, Disp: 1 each, Rfl: 12   glipiZIDE (GLUCOTROL XL) 10 MG 24 hr tablet, Take 1  tablet (10 mg total) by mouth daily with breakfast., Disp: 90 tablet, Rfl: 3   glucose blood (TRUE METRIX BLOOD GLUCOSE TEST) test strip, Use as directed once a day.  Dx code: E11.9, Disp: 100 each, Rfl: 1   icosapent Ethyl (VASCEPA) 1 g capsule, Take 2 capsules (2 g total) by mouth 2 (two) times daily., Disp: 360 capsule, Rfl: 3   isosorbide mononitrate (IMDUR) 60 MG 24 hr tablet, TAKE 1 TABLET EVERY DAY, Disp: 90 tablet, Rfl: 3   loratadine (CLARITIN) 10 MG tablet, Take 10 mg by mouth daily as needed for allergies., Disp: , Rfl:    metFORMIN (GLUCOPHAGE) 500 MG tablet, TAKE 1 TABLET TWICE DAILY WITH MEALS, Disp: 180 tablet, Rfl: 1   metoprolol succinate (TOPROL-XL) 25 MG 24 hr tablet, TAKE 1 TABLET IN THE MORNING  AND AT BEDTIME, Disp: 180 tablet, Rfl: 3   MULTIPLE VITAMIN-FOLIC ACID PO, Take 1 tablet by mouth daily. Contains 660 mg folic acid, Disp: , Rfl:    nitroGLYCERIN (NITROSTAT) 0.4 MG SL tablet, Place 1 tablet (0.4 mg total) under the tongue every 5 (five) minutes as needed for chest pain., Disp: 25 tablet, Rfl: 3   omeprazole (PRILOSEC) 20 MG capsule, Take 1 capsule (20 mg total) by mouth 2 (two) times  daily., Disp: 180 capsule, Rfl: 3   potassium chloride SA (KLOR-CON) 20 MEQ tablet, TAKE 1 TABLET EVERY DAY, Disp: 90 tablet, Rfl: 1   predniSONE (DELTASONE) 20 MG tablet, Take 20 mg daily for 5 days, Disp: 5 tablet, Rfl: 0   REPATHA SURECLICK 130 MG/ML SOAJ, INJECT 1 PEN INTO THE SKIN EVERY 14 (FOURTEEN) DAYS., Disp: 6 mL, Rfl: 3   tirzepatide (MOUNJARO) 2.5 MG/0.5ML Pen, Inject 2.5 mg into the skin once a week., Disp: 2 mL, Rfl: 0   triamcinolone cream (KENALOG) 0.1 %, Apply 1 application topically 2 (two) times daily. Use as needed for dry or itchy skin, Disp: 30 g, Rfl: 2   TRUEplus Lancets 33G MISC, Use as directed once a day.  Dx code: E11.9, Disp: 100 each, Rfl: 1   valsartan-hydrochlorothiazide (DIOVAN-HCT) 160-25 MG tablet, TAKE 1 TABLET EVERY DAY, Disp: 90 tablet, Rfl:  3  Allergies:  Allergies  Allergen Reactions   Other Hives    Intolerance to strong pain medications  Rash to opsite and tegaderm tape.   Rosuvastatin     Nausea, abdominal discomfort   Ticlopidine Hcl Hives and Itching   Black Pepper [[place]] Palpitations    HEART PALPITATIONS HEART PALPITATIONS   Codeine Palpitations and Other (See Comments)    Wild dreams, palpitations "wild dreams"    Past Medical History, Surgical history, Social history, and Family History were reviewed and updated.  Review of Systems: Review of Systems  Constitutional: Negative.   HENT:  Negative.    Eyes: Negative.   Respiratory: Negative.    Cardiovascular: Negative.   Gastrointestinal: Negative.   Endocrine: Negative.   Genitourinary: Negative.    Musculoskeletal: Negative.   Skin: Negative.   Neurological: Negative.   Hematological: Negative.   Psychiatric/Behavioral: Negative.     Physical Exam:  weight is 225 lb (102.1 kg). His oral temperature is 98.1 F (36.7 C). His blood pressure is 151/91 (abnormal) and his pulse is 77. His respiration is 18 and oxygen saturation is 99%.   Wt Readings from Last 3 Encounters:  06/05/21 225 lb (102.1 kg)  06/03/21 225 lb (102.1 kg)  05/21/21 223 lb (101.2 kg)    Physical Exam Vitals reviewed.  HENT:     Head: Normocephalic and atraumatic.  Eyes:     Pupils: Pupils are equal, round, and reactive to light.  Cardiovascular:     Rate and Rhythm: Normal rate and regular rhythm.     Heart sounds: Normal heart sounds.  Pulmonary:     Effort: Pulmonary effort is normal.     Breath sounds: Normal breath sounds.  Abdominal:     General: Bowel sounds are normal.     Palpations: Abdomen is soft.  Musculoskeletal:        General: No tenderness or deformity. Normal range of motion.     Cervical back: Normal range of motion.  Lymphadenopathy:     Cervical: No cervical adenopathy.  Skin:    General: Skin is warm and dry.     Findings: No erythema  or rash.  Neurological:     Mental Status: He is alert and oriented to person, place, and time.  Psychiatric:        Behavior: Behavior normal.        Thought Content: Thought content normal.        Judgment: Judgment normal.     Lab Results  Component Value Date   WBC 22.7 (H) 06/05/2021   HGB 14.6 06/05/2021  HCT 43.4 06/05/2021   MCV 91.6 06/05/2021   PLT 252 06/05/2021     Chemistry      Component Value Date/Time   NA 143 06/05/2021 1006   NA 143 06/01/2020 0900   NA 143 06/11/2017 1044   NA 140 04/27/2014 0815   K 3.6 06/05/2021 1006   K 3.2 (L) 06/11/2017 1044   K 3.7 04/27/2014 0815   CL 102 06/05/2021 1006   CL 101 06/11/2017 1044   CO2 31 06/05/2021 1006   CO2 28 06/11/2017 1044   CO2 26 04/27/2014 0815   BUN 27 (H) 06/05/2021 1006   BUN 18 06/01/2020 0900   BUN 17 06/11/2017 1044   BUN 18.5 04/27/2014 0815   CREATININE 1.16 06/05/2021 1006   CREATININE 1.1 06/11/2017 1044   CREATININE 1.2 04/27/2014 0815      Component Value Date/Time   CALCIUM 10.3 06/05/2021 1006   CALCIUM 9.7 06/11/2017 1044   CALCIUM 9.8 04/27/2014 0815   ALKPHOS 72 06/05/2021 1006   ALKPHOS 83 06/11/2017 1044   ALKPHOS 71 04/27/2014 0815   AST 19 06/05/2021 1006   AST 18 04/27/2014 0815   ALT 22 06/05/2021 1006   ALT 32 06/11/2017 1044   ALT 25 04/27/2014 0815   BILITOT 0.6 06/05/2021 1006   BILITOT 0.67 04/27/2014 0815       Impression and Plan: Brandon Stone is a 75 year old white male.  He has stage A CLL.  He has minimal lymphocytosis.  His white cell count is up a little bit.  As such, I think we will have to watch this.  I would like to get him back in 6 months so we can follow-up.  I would think that the antibiotic should help with this bronchitis that he has.   Volanda Napoleon, MD 12/14/202210:52 AM

## 2021-06-29 ENCOUNTER — Other Ambulatory Visit: Payer: Self-pay | Admitting: Internal Medicine

## 2021-07-01 ENCOUNTER — Other Ambulatory Visit: Payer: Self-pay | Admitting: Internal Medicine

## 2021-07-02 ENCOUNTER — Encounter: Payer: Self-pay | Admitting: Family Medicine

## 2021-07-03 MED ORDER — METFORMIN HCL 1000 MG PO TABS: 1000.0000 mg | ORAL_TABLET | Freq: Two times a day (BID) | ORAL | 3 refills | Status: DC

## 2021-07-03 NOTE — Addendum Note (Signed)
Addended by: Lamar Blinks C on: 07/03/2021 10:04 AM   Modules accepted: Orders

## 2021-07-18 ENCOUNTER — Encounter: Payer: Self-pay | Admitting: Family Medicine

## 2021-07-18 DIAGNOSIS — E119 Type 2 diabetes mellitus without complications: Secondary | ICD-10-CM

## 2021-07-18 MED ORDER — GLIPIZIDE ER 5 MG PO TB24: 5.0000 mg | ORAL_TABLET | Freq: Every day | ORAL | 3 refills | Status: DC

## 2021-07-18 NOTE — Addendum Note (Signed)
Addended by: Lamar Blinks C on: 07/18/2021 08:40 PM   Modules accepted: Orders

## 2021-07-20 MED ORDER — GLIPIZIDE ER 10 MG PO TB24: 10.0000 mg | ORAL_TABLET | Freq: Every day | ORAL | 3 refills | Status: DC

## 2021-07-20 NOTE — Addendum Note (Signed)
Addended by: Lamar Blinks C on: 07/20/2021 03:30 PM   Modules accepted: Orders

## 2021-07-29 ENCOUNTER — Other Ambulatory Visit: Payer: Self-pay | Admitting: Internal Medicine

## 2021-08-20 ENCOUNTER — Encounter: Payer: Self-pay | Admitting: Internal Medicine

## 2021-08-20 ENCOUNTER — Encounter: Payer: Self-pay | Admitting: Family Medicine

## 2021-08-20 MED ORDER — ICOSAPENT ETHYL 1 G PO CAPS: 2.0000 g | ORAL_CAPSULE | Freq: Two times a day (BID) | ORAL | 0 refills | Status: DC

## 2021-08-23 ENCOUNTER — Other Ambulatory Visit: Payer: Self-pay | Admitting: Internal Medicine

## 2021-08-26 ENCOUNTER — Other Ambulatory Visit: Payer: Self-pay | Admitting: Family Medicine

## 2021-08-26 DIAGNOSIS — K219 Gastro-esophageal reflux disease without esophagitis: Secondary | ICD-10-CM

## 2021-08-27 NOTE — Progress Notes (Signed)
? ?Cardiology Office Note ? ? ?Date:  08/28/2021  ? ?ID:  Brandon Stone, DOB April 27, 1946, MRN [id number] ? ?PCP:  Brandon Mclean, MD  ?Cardiologist:   Brandon Carnes, MD  ? ?  ?F/U of CAD  ? ?History of Present Illness: ?Brandon Stone is a 76 y.o. male with a history of ?CAD (s/p CABG in 1999; [place] in 1998.  Redo CABG in 2004 (SVG to OM/LCx; LRA to PDA).  Cath in July 2013 due to increased CP showed severe LM disease into LCx.; SVG to OM 100%; LIMA to Diag and LAD patent; Radial Artery to PDA patent   LVEF 45%  Plan was to continue medical Rx.  Myoview in 2016 showed scar but no ischemia   ?2018    Echo showed LVEF normal  Mild diastolic dysfunciton.   ? ?Since seen the pt has done OK   He is not too active   He is starting to walk some up and down driveway   Does say he gets SOB with activity along with some chest tightness   Resolves with rest  Also SOB with bending    ? ?BP at home is in 120s/   ? ?Diet:  Cereal, occasional eggs ?Lunch   Sandwich  / leftovers   Admits to eating more bread ?[place] ?Dinner   What wife cooks  ?No soda   Water   ?No ice cream as he used to eat    ? ? ?Current Meds  ?Medication Sig  ? acetaminophen (TYLENOL) 325 MG tablet Take 325 mg by mouth every 6 (six) hours as needed for mild pain.  ? albuterol (VENTOLIN HFA) 108 (90 Base) MCG/ACT inhaler Inhale 2 puffs into the lungs every 6 (six) hours as needed.  ? allopurinol (ZYLOPRIM) 300 MG tablet TAKE 1 TABLET EVERY DAY  ? amLODipine (NORVASC) 10 MG tablet Take 1 tablet (10 mg total) by mouth daily.  ? aspirin EC 81 MG tablet Take 81 mg by mouth daily.  ? atorvastatin (LIPITOR) 40 MG tablet TAKE 1 TABLET EVERY DAY  ? benzonatate (TESSALON) 100 MG capsule Take 1 capsule (100 mg total) by mouth 3 (three) times daily as needed for cough.  ? budesonide-formoterol (SYMBICORT) 160-4.5 MCG/ACT inhaler 2 inhalation twice daily  ? glipiZIDE (GLUCOTROL XL) 10 MG 24 hr tablet Take 1 tablet (10 mg total) by mouth daily with breakfast.   ? glipiZIDE (GLUCOTROL XL) 5 MG 24 hr tablet Take 1 tablet (5 mg total) by mouth daily with breakfast. Take with 10 mg tablet for 15 mg total  ? glucose blood (TRUE METRIX BLOOD GLUCOSE TEST) test strip Use as directed once a day.  Dx code: E11.9  ? icosapent Ethyl (VASCEPA) 1 g capsule Take 2 capsules (2 g total) by mouth 2 (two) times daily. Please make overdue appt with Dr. Harrington Challenger before anymore refills. Thank you 2nd attempt  ? isosorbide mononitrate (IMDUR) 60 MG 24 hr tablet TAKE 1 TABLET EVERY DAY  ? loratadine (CLARITIN) 10 MG tablet Take 10 mg by mouth daily as needed for allergies.  ? metFORMIN (GLUCOPHAGE) 500 MG tablet Take 1 tablet by mouth in the morning and at bedtime.  ? metoprolol succinate (TOPROL-XL) 25 MG 24 hr tablet TAKE 1 TABLET IN THE MORNING  AND AT BEDTIME. Please keep upcoming appt. With Dr. Harrington Challenger in order to receive further refills. Thank You.  ? MULTIPLE VITAMIN-FOLIC ACID PO Take 1 tablet by mouth daily. Contains 409 mg folic  acid  ? nitroGLYCERIN (NITROSTAT) 0.4 MG SL tablet Place 1 tablet (0.4 mg total) under the tongue every 5 (five) minutes as needed for chest pain.  ? omeprazole (PRILOSEC) 20 MG capsule TAKE 1 CAPSULE TWICE DAILY  ? potassium chloride SA (KLOR-CON) 20 MEQ tablet TAKE 1 TABLET EVERY DAY  ? REPATHA SURECLICK 694 MG/ML SOAJ INJECT 1 PEN INTO THE SKIN EVERY 14 (FOURTEEN) DAYS.  ? triamcinolone cream (KENALOG) 0.1 % Apply 1 application topically 2 (two) times daily. Use as needed for dry or itchy skin  ? TRUEplus Lancets 33G MISC Use as directed once a day.  Dx code: E11.9  ? valsartan-hydrochlorothiazide (DIOVAN-HCT) 160-25 MG tablet TAKE 1 TABLET EVERY DAY  ? [DISCONTINUED] amLODipine (NORVASC) 5 MG tablet TAKE 1 TABLET EVERY DAY  ? ? ? ?Allergies:   Other, Rosuvastatin, Ticlopidine hcl, Black pepper [piper], and Codeine  ? ?Past Medical History:  ?Diagnosis Date  ? Anginal pain ([place])   ? Arthritis   ? Barrett's esophagus   ? BPH (benign prostatic hypertrophy)   ? CAD  (coronary artery disease)   ?  a. CABG 1991;  b.  [place];  c.  S/P PTCA  stents to SVG ot OM last in 2003;  d.  Redo CABG x 2 in 2004 (VG->OM->LCX, LRA->PDA);  e. 12/2011 Cath: 3vd, sev LM into LCX dzs, VG->OM 100, LIMA->DIAG->LAD patent, Rad Art->PDA patent, EF 45%, Med Rx.  ? Diverticulosis   ? DM (diabetes mellitus) ([place])   ? Dyslipidemia   ? Dyspnea   ? Gallstones   ? GERD (gastroesophageal reflux disease)   ? Gout   ? History of hiatal hernia   ? HTN (hypertension)   ? Hyperlipidemia   ? Leukemia in remission [facility])   ? Psoriasis   ? Rheumatic fever   ? as a child  ? S/P coronary artery stent placement 1998  ? TIA (transient ischemic attack)   ? FOUND ON MRI  PER PATIENT-     NO PROBLEM  ? ? ?Past Surgical History:  ?Procedure Laterality Date  ? APPENDECTOMY    ? CHOLECYSTECTOMY N/A 09/18/2016  ? Procedure: LAPAROSCOPIC CHOLECYSTECTOMY WITH INTRAOPERATIVE CHOLANGIOGRAM;  Surgeon: Jackolyn Confer, MD;  Location: [facility];  Service: General;  Laterality: N/A;  ? COLONOSCOPY  07/2013  ? CORONARY ANGIOPLASTY    ? STENTS  ? CORONARY ARTERY BYPASS GRAFT    ? 1991 (LIMA to LAD/Diag;  SVG to RCA;  SVG to OM)  ? CORONARY ARTERY BYPASS GRAFT    ? 2004 (L radial to PDA; SVG to OM1/ distal LCx)  ? TONSILLECTOMY AND ADENOIDECTOMY    ? UMBILICAL HERNIA REPAIR    ? ? ? ?Social History:  The patient  reports that he quit smoking about 50 years ago. His smoking use included cigarettes. He has never used smokeless tobacco. He reports that he does not drink alcohol and does not use drugs.  ? ?Family History:  The patient's family history includes Diabetes in his father and mother; Heart disease in his father and mother; Hyperlipidemia in his father and mother; Hypertension in his father and mother; Skin cancer in his mother; Stroke in his brother and sister.  ? ? ?ROS:  Please see the history of present illness. All other systems are reviewed and  Negative to the above problem except as noted.  ? ? ?PHYSICAL EXAM: ?VS:  BP 136/84    Pulse 75   Ht '5\' 11"'$  (1.803 m)   Wt 223  lb (101.2 kg)   SpO2 97%   BMI 31.10 kg/m?   ?BP on my check 165/92     On his cuff   170/ ?GEN: Obese 76 yo, in no acute distress  ?HEENT: normal  ?Neck: JVP is not elevated    No carotid bruits ?Cardiac: RRR; no murmurs  No LE  edema  ?Respiratory:  clear to auscultation bilaterally,  ?GI: soft, nontender,  Obese, + BS  No hepatomegaly  ?MS: no deformity Moving all extremities   ?Skin: warm and dry, no rash ?Neuro:  Strength and sensation are intact ?Psych: euthymic mood, full affect ? ? ?EKG:  EKG is  ordered today.  NSR 75 bpm  Inferolateral MI   Nonspecific IVCD   ? ? ?Lipid Panel ?   ?Component Value Date/Time  ? CHOL 79 06/03/2021 1103  ? CHOL 79 (L) 06/01/2020 0900  ? TRIG 205.0 (H) 06/03/2021 1103  ? HDL 40.80 06/03/2021 1103  ? HDL 42 06/01/2020 0900  ? CHOLHDL 2 06/03/2021 1103  ? VLDL 41.0 (H) 06/03/2021 1103  ? [place] 12 06/01/2020 0900  ? LDLDIRECT 23.0 06/03/2021 1103  ? ?  ? ?Wt Readings from Last 3 Encounters:  ?08/28/21 223 lb (101.2 kg)  ?06/05/21 225 lb (102.1 kg)  ?06/03/21 225 lb (102.1 kg)  ?  ? ? ?ASSESSMENT AND PLAN: ? ?1   CAD  Pt getting close to 20 years from CABG   He is not that active   Does admit to SOB and occasional chest tightness with walking     May be related to size.  ?I would recomm a lexiscan or GXT myoview    ? ?2.  HTN  BP on my check and with his BP cuff are high    ?I would recomm he continue to follow at home  ?I would also recomm he increase amlodipine to 10 mg  ?F/U in HTN clinic in 4 to 6 wks   ? ?3  HL Will check lipids  On Repatha ? ?4  Obesity  Discussed diet    Limit carbs and sugars    TRE ? ?5  Heme patient followed by Burney Gauze. ? ?F/U in 1 year    ? ?Current medicines are reviewed at length with the patient today.  The patient does not have concerns regarding medicines. ? ?Signed, ?Brandon Carnes, MD  ?08/28/2021 5:48 PM    ?[facility] ?[address], [place], [place]  [address] ?Phone: [phone]; Fax: [phone]  ? ? ?

## 2021-08-28 ENCOUNTER — Other Ambulatory Visit: Payer: Self-pay

## 2021-08-28 ENCOUNTER — Encounter: Payer: Self-pay | Admitting: Internal Medicine

## 2021-08-28 ENCOUNTER — Ambulatory Visit: Payer: Medicare HMO | Admitting: Internal Medicine

## 2021-08-28 ENCOUNTER — Telehealth (HOSPITAL_COMMUNITY): Payer: Self-pay | Admitting: *Deleted

## 2021-08-28 VITALS — BP 136/84 | HR 75 | Ht 71.0 in | Wt 223.0 lb

## 2021-08-28 DIAGNOSIS — I2510 Atherosclerotic heart disease of native coronary artery without angina pectoris: Secondary | ICD-10-CM

## 2021-08-28 DIAGNOSIS — R079 Chest pain, unspecified: Secondary | ICD-10-CM

## 2021-08-28 DIAGNOSIS — E785 Hyperlipidemia, unspecified: Secondary | ICD-10-CM | POA: Diagnosis not present

## 2021-08-28 MED ORDER — AMLODIPINE BESYLATE 10 MG PO TABS: 10.0000 mg | ORAL_TABLET | Freq: Every day | ORAL | 3 refills | Status: DC

## 2021-08-28 NOTE — Patient Instructions (Addendum)
Medication Instructions:  ?Increase Amlodipine to 10 mg daily  ?*If you need a refill on your cardiac medications before your next appointment, please call your pharmacy* ? ? ?Lab Work: ?none ?If you have labs (blood work) drawn today and your tests are completely normal, you will receive your results only by: ?MyChart Message (if you have MyChart) OR ?A paper copy in the mail ?If you have any lab test that is abnormal or we need to change your treatment, we will call you to review the results. ? ? ?Testing/Procedures: ?Your physician has requested that you have en exercise stress myoview. For further information please visit [email or web address]. Please follow instruction sheet, as given. ? ? ? ? ?Follow-Up: ?At [facility], you and your health needs are our priority.  As part of our continuing mission to provide you with exceptional heart care, we have created designated Provider Care Teams.  These Care Teams include your primary Cardiologist (physician) and Advanced Practice Providers (APPs -  Physician Assistants and Nurse Practitioners) who all work together to provide you with the care you need, when you need it. ? ?We recommend signing up for the patient portal called "MyChart".  Sign up information is provided on this After Visit Summary.  MyChart is used to connect with patients for Virtual Visits (Telemedicine).  Patients are able to view lab/test results, encounter notes, upcoming appointments, etc.  Non-urgent messages can be sent to your provider as well.   ?To learn more about what you can do with MyChart, go to [email or web address].   ? ?Your next appointment:   ?1 year(s) ? ?The format for your next appointment:   ?In Person ? ?Provider:   ?Dorris Carnes, MD   ? ? ?Other Instructions ?  ?Follow up in one month with the PharmD for HTN... has seen before  ?

## 2021-08-28 NOTE — Telephone Encounter (Signed)
Patient given detailed instructions per Myocardial Perfusion Study Information Sheet for the test on 09/04/2021 at 10:15. Patient notified to arrive 15 minutes early and that it is imperative to arrive on time for appointment to keep from having the test rescheduled. ? If you need to cancel or reschedule your appointment, please call the office within 24 hours of your appointment. . Patient verbalized understanding.Brandon Stone ? ? ?

## 2021-09-02 ENCOUNTER — Ambulatory Visit: Payer: Medicare HMO | Admitting: Internal Medicine

## 2021-09-04 ENCOUNTER — Other Ambulatory Visit: Payer: Self-pay

## 2021-09-04 ENCOUNTER — Ambulatory Visit (HOSPITAL_COMMUNITY): Payer: Medicare HMO | Attending: Cardiology

## 2021-09-04 DIAGNOSIS — R079 Chest pain, unspecified: Secondary | ICD-10-CM | POA: Insufficient documentation

## 2021-09-04 LAB — MYOCARDIAL PERFUSION IMAGING
Estimated workload: 7
Exercise duration (min): 5 min
Exercise duration (sec): 30 s
LV dias vol: 92 mL (ref 62–150)
LV sys vol: 49 mL
MPHR: 147 {beats}/min
Nuc Stress EF: 47 %
Peak HR: 171 {beats}/min
Percent HR: 116 %
Rest HR: 93 {beats}/min
Rest Nuclear Isotope Dose: 10.4 mCi
SDS: 7
SRS: 15
SSS: 23
Stress Nuclear Isotope Dose: 32.6 mCi
TID: 0.95

## 2021-09-04 MED ORDER — TECHNETIUM TC 99M TETROFOSMIN IV KIT
32.6000 | PACK | Freq: Once | INTRAVENOUS | Status: AC | PRN
  Administered 2021-09-04: 32.6 via INTRAVENOUS
  Filled 2021-09-04: qty 33

## 2021-09-04 MED ORDER — TECHNETIUM TC 99M TETROFOSMIN IV KIT
10.4000 | PACK | Freq: Once | INTRAVENOUS | Status: AC | PRN
  Administered 2021-09-04: 10.4 via INTRAVENOUS
  Filled 2021-09-04: qty 11

## 2021-09-17 ENCOUNTER — Other Ambulatory Visit: Payer: Self-pay | Admitting: Internal Medicine

## 2021-09-17 MED ORDER — ICOSAPENT ETHYL 1 G PO CAPS: 2.0000 g | ORAL_CAPSULE | Freq: Two times a day (BID) | ORAL | 3 refills | Status: DC

## 2021-09-29 NOTE — Progress Notes (Signed)
Therapist, music at [facility] ?[address], [address] ?[place], [place] [address] ?336 [phone] ?Fax [phone] ? ?Date:  10/07/2021  ? ?Name:  Brandon Stone   DOB:  [birth year]   MRN:  [id number] ? ?PCP:  Darreld Mclean, MD  ? ? ?Chief Complaint: 3 month follow up (Concerns/ questions: 1.ref to GI for endoscopy and colonoscopy- last was in [place]. No preference this time. 2. Does pt still need the Symbicort inhaler? /DM eye exam: scheduled 12/2021) ? ? ?History of Present Illness: ? ?Brandon Stone is a 76 y.o. very pleasant male patient who presents with the following: ? ?Pt seen today for short tem follow-up ?Last seen by myself in December for a cough - this is finally better, he thinks he may have some allergies as his sx are worse with wet weather  ?History of diabetes, CLL, hyperlipidemia, hypertension, CAD, subdural hematoma March 2020 ? ?Seen by Dr Harrington Challenger in March: ?1   CAD  Pt getting close to 20 years from CABG   He is not that active   Does admit to SOB and occasional chest tightness with walking     May be related to size.  ?I would recomm a lexiscan or GXT myoview    ?2.  HTN  BP on my check and with his BP cuff are high    ?I would recomm he continue to follow at home  ?I would also recomm he increase amlodipine to 10 mg  ?F/U in HTN clinic in 4 to 6 wks   ?3  HL Will check lipids  On Repatha ?4  Obesity  Discussed diet    Limit carbs and sugars    TRE ?  ?He did a myoview in March- ok  ? ?Seen by Marin Olp in December, current treatment obs:  ?He has stage A CLL.  He has minimal lymphocytosis.  His white cell count is up a little bit.  As such, I think we will have to watch this.  I would like to get him back in 6 months so we can follow-up. ? ?Covid series: ?Shingrix ?Eye exam- scheduled for July ?He had a colon in [place] about 4 years ago, they wanted 3 year follow-up.  Last colon done per: ?Thornton Park, MD ?[facility] ?[facility] ?[address] ?[place], [place] [address] ?Phone: [phone] ?Fax: [phone] ? ?Colonoscopy report requested ?Patient reports he also has history of Barrett's esophagus and may need an upper GI as well.  Referral placed to gastroenterology locally ?Lab Results  ?Component Value Date  ? HGBA1C 8.1 (H) 05/22/2021  ? ?He was using Symbicort over the winter- he has stopped using this and the cough is not worse ? ?He is taking 15 mg glipizide now and his fasting glucose is generally 160 or less -no hypoglycemia however ?Patient Active Problem List  ? Diagnosis Date Noted  ? AAA (abdominal aortic aneurysm) ([place]) 10/31/2019  ? Subdural hematoma ([place]) 09/08/2018  ? Diabetes mellitus without complication ([place]) 24/02/7352  ? Symptomatic cholelithiasis 09/18/2016  ? 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 Stable angina ([place]) 01/26/2012  ? Sore throat 11/03/2010  ? Hyperlipidemia 06/21/2008  ? HYPERTENSION, BENIGN 06/21/2008  ? CAD, NATIVE VESSEL 06/21/2008  ? ? ?Past Medical History:  ?Diagnosis Date  ? Anginal pain ([place])   ? Arthritis   ? Barrett's esophagus   ? BPH (benign prostatic hypertrophy)   ? CAD (coronary artery disease)   ?  a. CABG 1991;  b.  [place];  c.  S/P PTCA  stents to SVG ot OM last in 2003;  d.  Redo CABG x 2 in 2004 (VG->OM->LCX, LRA->PDA);  e. 12/2011 Cath: 3vd, sev LM into LCX dzs, VG->OM 100, LIMA->DIAG->LAD patent, Rad Art->PDA patent, EF 45%, Med Rx.  ? Diverticulosis   ? DM (diabetes mellitus) ([place])   ? Dyslipidemia   ? Dyspnea   ? Gallstones   ? GERD (gastroesophageal reflux disease)   ? Gout   ? History of hiatal hernia   ? HTN (hypertension)   ? Hyperlipidemia   ? Leukemia in remission [facility])   ? Psoriasis   ? Rheumatic fever   ? as a child  ? S/P coronary artery stent placement 1998  ? TIA (transient ischemic attack)   ? FOUND ON MRI  PER PATIENT-     NO PROBLEM  ? ? ?Past Surgical History:  ?Procedure Laterality Date  ? APPENDECTOMY    ? CHOLECYSTECTOMY N/A 09/18/2016  ? Procedure:  LAPAROSCOPIC CHOLECYSTECTOMY WITH INTRAOPERATIVE CHOLANGIOGRAM;  Surgeon: Jackolyn Confer, MD;  Location: [facility];  Service: General;  Laterality: N/A;  ? COLONOSCOPY  07/2013  ? CORONARY ANGIOPLASTY    ? STENTS  ? CORONARY ARTERY BYPASS GRAFT    ? 1991 (LIMA to LAD/Diag;  SVG to RCA;  SVG to OM)  ? CORONARY ARTERY BYPASS GRAFT    ? 2004 (L radial to PDA; SVG to OM1/ distal LCx)  ? TONSILLECTOMY AND ADENOIDECTOMY    ? UMBILICAL HERNIA REPAIR    ? ? ?Social History  ? ?Tobacco Use  ? Smoking status: Former  ?  Years: 8.00  ?  Types: Cigarettes  ?  Quit date: 06/24/1971  ?  Years since quitting: 50.3  ? Smokeless tobacco: Never  ?Substance Use Topics  ? Alcohol use: No  ? Drug use: No  ? ? ?Family History  ?Problem Relation Age of Onset  ? Heart disease Mother   ? Hypertension Mother   ? Hyperlipidemia Mother   ? Diabetes Mother   ? Skin cancer Mother   ?     skin  ? Heart disease Father   ? Hypertension Father   ? Hyperlipidemia Father   ? Diabetes Father   ? Stroke Sister   ? Stroke Brother   ? Colon cancer Neg Hx   ? ? ?Allergies  ?Allergen Reactions  ? Other Hives  ?  Intolerance to strong pain medications ? ?Rash to opsite and tegaderm tape.  ? Rosuvastatin   ?  Nausea, abdominal discomfort  ? Ticlopidine Hcl Hives and Itching  ? Black Pepper [[place]] Palpitations  ?  HEART PALPITATIONS ?HEART PALPITATIONS  ? Codeine Palpitations and Other (See Comments)  ?  Wild dreams, palpitations ?"wild dreams"  ? ? ?Medication list has been reviewed and updated. ? ?Current Outpatient Medications on File Prior to Visit  ?Medication Sig Dispense Refill  ? acetaminophen (TYLENOL) 325 MG tablet Take 325 mg by mouth every 6 (six) hours as needed for mild pain.    ? albuterol (VENTOLIN HFA) 108 (90 Base) MCG/ACT inhaler Inhale 2 puffs into the lungs every 6 (six) hours as needed. 18 g 0  ? allopurinol (ZYLOPRIM) 300 MG tablet TAKE 1 TABLET EVERY DAY 90 tablet 2  ? amLODipine (NORVASC) 10 MG tablet Take 1 tablet (10 mg total) by mouth  daily. 90 tablet 3  ? aspirin EC 81 MG tablet Take 81 mg by mouth daily.    ?  atorvastatin (LIPITOR) 40 MG tablet TAKE 1 TABLET EVERY DAY 90 tablet 2  ? budesonide-formoterol (SYMBICORT) 160-4.5 MCG/ACT inhaler 2 inhalation twice daily 1 each 12  ? glipiZIDE (GLUCOTROL XL) 10 MG 24 hr tablet Take 1 tablet (10 mg total) by mouth daily with breakfast. 90 tablet 3  ? glipiZIDE (GLUCOTROL XL) 5 MG 24 hr tablet Take 1 tablet (5 mg total) by mouth daily with breakfast. Take with 10 mg tablet for 15 mg total 30 tablet 3  ? glucose blood (TRUE METRIX BLOOD GLUCOSE TEST) test strip Use as directed once a day.  Dx code: E11.9 100 each 1  ? icosapent Ethyl (VASCEPA) 1 g capsule Take 2 capsules (2 g total) by mouth 2 (two) times daily. 360 capsule 3  ? isosorbide mononitrate (IMDUR) 60 MG 24 hr tablet TAKE 1 TABLET EVERY DAY 90 tablet 3  ? loratadine (CLARITIN) 10 MG tablet Take 10 mg by mouth daily as needed for allergies.    ? metFORMIN (GLUCOPHAGE) 500 MG tablet Take 1 tablet by mouth in the morning and at bedtime.    ? metoprolol succinate (TOPROL-XL) 25 MG 24 hr tablet TAKE 1 TABLET IN THE MORNING  AND AT BEDTIME. Please keep upcoming appt. With Dr. Harrington Challenger in order to receive further refills. Thank You. 180 tablet 0  ? MULTIPLE VITAMIN-FOLIC ACID PO Take 1 tablet by mouth daily. Contains 409 mg folic acid    ? nitroGLYCERIN (NITROSTAT) 0.4 MG SL tablet Place 1 tablet (0.4 mg total) under the tongue every 5 (five) minutes as needed for chest pain. 25 tablet 3  ? omeprazole (PRILOSEC) 20 MG capsule TAKE 1 CAPSULE TWICE DAILY 180 capsule 3  ? potassium chloride SA (KLOR-CON) 20 MEQ tablet TAKE 1 TABLET EVERY DAY 90 tablet 1  ? REPATHA SURECLICK 811 MG/ML SOAJ INJECT 1 PEN INTO THE SKIN EVERY 14 (FOURTEEN) DAYS. 6 mL 3  ? triamcinolone cream (KENALOG) 0.1 % Apply 1 application topically 2 (two) times daily. Use as needed for dry or itchy skin 30 g 2  ? TRUEplus Lancets 33G MISC Use as directed once a day.  Dx code: E11.9 100  each 1  ? valsartan-hydrochlorothiazide (DIOVAN-HCT) 160-25 MG tablet TAKE 1 TABLET EVERY DAY 90 tablet 3  ? ?No current facility-administered medications on file prior to visit.  ? ? ?Review of Systems:

## 2021-09-30 ENCOUNTER — Ambulatory Visit: Payer: Medicare HMO

## 2021-10-01 ENCOUNTER — Telehealth: Payer: Self-pay | Admitting: Pharmacist

## 2021-10-01 NOTE — Telephone Encounter (Signed)
Patient called and LVM that he needed to renew his healthwell grant. ? ?[place]- Please renew grant and call patient.Thanks ?

## 2021-10-02 NOTE — Telephone Encounter (Signed)
Called and spoke to pt and they stated that they already got the hwf approved on his own but I have attached it here for our records as well.  ?PATIENT ?Brandon Stone ?  ?STATUS  ?Active ?  ?START DATE ?09/23/2021 ?  ?END DATE ?09/23/2022 ?  ?ASSISTANCE TYPE ?Co-pay ?  ?PAID ?$259.61 ?  ?PENDING ?$0.00 ?  ?BALANCE ?$2240.39 ?Pharmacy Card ?CARD NO. ?[id number] ?  ?CARD STATUS ?Active ?  ?BIN ?[id number] ?  ?PCN ?[id number] ?  ?PC GROUP ?[id number] ?  ?HELP DESK ?[phone] ?  ?PROVIDER ?PDMI ?  ?PROCESSOR ?PDMI ?

## 2021-10-07 ENCOUNTER — Ambulatory Visit (INDEPENDENT_AMBULATORY_CARE_PROVIDER_SITE_OTHER): Payer: Medicare HMO | Admitting: Family Medicine

## 2021-10-07 ENCOUNTER — Telehealth: Payer: Self-pay | Admitting: Family Medicine

## 2021-10-07 ENCOUNTER — Encounter: Payer: Self-pay | Admitting: Family Medicine

## 2021-10-07 VITALS — BP 136/80 | HR 75 | Temp 97.6°F | Resp 18 | Ht 72.0 in | Wt 224.2 lb

## 2021-10-07 DIAGNOSIS — I10 Essential (primary) hypertension: Secondary | ICD-10-CM

## 2021-10-07 DIAGNOSIS — E119 Type 2 diabetes mellitus without complications: Secondary | ICD-10-CM

## 2021-10-07 DIAGNOSIS — Z125 Encounter for screening for malignant neoplasm of prostate: Secondary | ICD-10-CM | POA: Diagnosis not present

## 2021-10-07 DIAGNOSIS — E785 Hyperlipidemia, unspecified: Secondary | ICD-10-CM | POA: Diagnosis not present

## 2021-10-07 DIAGNOSIS — Z1211 Encounter for screening for malignant neoplasm of colon: Secondary | ICD-10-CM | POA: Diagnosis not present

## 2021-10-07 DIAGNOSIS — E049 Nontoxic goiter, unspecified: Secondary | ICD-10-CM

## 2021-10-07 LAB — BASIC METABOLIC PANEL
BUN: 21 mg/dL (ref 6–23)
CO2: 29 mEq/L (ref 19–32)
Calcium: 9.3 mg/dL (ref 8.4–10.5)
Chloride: 104 mEq/L (ref 96–112)
Creatinine, Ser: 1.1 mg/dL (ref 0.40–1.50)
GFR: 66.67 mL/min (ref >60.00)
Glucose, Bld: 145 mg/dL — ABNORMAL HIGH (ref 70–99)
Potassium: 3.8 mEq/L (ref 3.5–5.1)
Sodium: 142 mEq/L (ref 135–145)

## 2021-10-07 LAB — HEMOGLOBIN A1C: Hgb A1c MFr Bld: 7.9 % — ABNORMAL HIGH (ref 4.6–6.5)

## 2021-10-07 LAB — PSA: PSA: 0.74 ng/mL (ref 0.10–4.00)

## 2021-10-07 NOTE — Patient Instructions (Addendum)
We will get you in with GI- Drs Collene Mares and Benson Norway- with [facility] ?I will be in touch with your labs asap  ? ?Please see me in about 6 months assuming all is ok ?Please let me know if cough gets worse again ?I will request a copy of your last GI report from [facility] ?I recommend covid and shingles vaccine series if not done yet  ? ?Take care!  ?

## 2021-10-07 NOTE — Telephone Encounter (Signed)
error 

## 2021-10-08 ENCOUNTER — Encounter: Payer: Self-pay | Admitting: Family Medicine

## 2021-10-08 ENCOUNTER — Ambulatory Visit (HOSPITAL_BASED_OUTPATIENT_CLINIC_OR_DEPARTMENT_OTHER)
Admission: RE | Admit: 2021-10-08 | Discharge: 2021-10-08 | Disposition: A | Discharge status: Home / Self Care | Payer: Medicare HMO | Source: Ambulatory Visit | Attending: Family Medicine | Admitting: Family Medicine

## 2021-10-08 DIAGNOSIS — E049 Nontoxic goiter, unspecified: Secondary | ICD-10-CM | POA: Diagnosis not present

## 2021-10-10 ENCOUNTER — Other Ambulatory Visit: Payer: Self-pay | Admitting: Family Medicine

## 2021-10-17 ENCOUNTER — Ambulatory Visit: Payer: Medicare HMO

## 2021-10-17 ENCOUNTER — Ambulatory Visit (INDEPENDENT_AMBULATORY_CARE_PROVIDER_SITE_OTHER): Payer: Medicare HMO

## 2021-10-17 ENCOUNTER — Other Ambulatory Visit: Payer: Self-pay | Admitting: Family Medicine

## 2021-10-17 DIAGNOSIS — Z0000 Encounter for general adult medical examination without abnormal findings: Secondary | ICD-10-CM

## 2021-10-17 NOTE — Patient Instructions (Signed)
Brandon Stone , ?Thank you for taking time to come for your Medicare Wellness Visit. I appreciate your ongoing commitment to your health goals. Please review the following plan we discussed and let me know if I can assist you in the future.  ? ?Screening recommendations/referrals: ?Colonoscopy: 07/21/16 due now will schedule ?Recommended yearly ophthalmology/optometry visit for glaucoma screening and checkup ?Recommended yearly dental visit for hygiene and checkup ? ?Vaccinations: ?Influenza vaccine: declined ?Pneumococcal vaccine: up to date ?Tdap vaccine: up to date ?Shingles vaccine: Due-May obtain vaccine at our local pharmacy.    ?Covid-19: Due-May obtain vaccine at our local pharmacy.  ? ?Advanced directives: yes, not on file ? ?Conditions/risks identified: see problem list ? ?Next appointment: Follow up in one year for your annual wellness visit. 10/21/22 ? ?Preventive Care 74 Years and Older, Male ?Preventive care refers to lifestyle choices and visits with your health care provider that can promote health and wellness. ?What does preventive care include? ?A yearly physical exam. This is also called an annual well check. ?Dental exams once or twice a year. ?Routine eye exams. Ask your health care provider how often you should have your eyes checked. ?Personal lifestyle choices, including: ?Daily care of your teeth and gums. ?Regular physical activity. ?Eating a healthy diet. ?Avoiding tobacco and drug use. ?Limiting alcohol use. ?Practicing safe sex. ?Taking low doses of aspirin every day. ?Taking vitamin and mineral supplements as recommended by your health care provider. ?What happens during an annual well check? ?The services and screenings done by your health care provider during your annual well check will depend on your age, overall health, lifestyle risk factors, and family history of disease. ?Counseling  ?Your health care provider may ask you questions about your: ?Alcohol use. ?Tobacco use. ?Drug  use. ?Emotional well-being. ?Home and relationship well-being. ?Sexual activity. ?Eating habits. ?History of falls. ?Memory and ability to understand (cognition). ?Work and work Statistician. ?Screening  ?You may have the following tests or measurements: ?Height, weight, and BMI. ?Blood pressure. ?Lipid and cholesterol levels. These may be checked every 5 years, or more frequently if you are over 64 years old. ?Skin check. ?Lung cancer screening. You may have this screening every year starting at age 63 if you have a 30-pack-year history of smoking and currently smoke or have quit within the past 15 years. ?Fecal occult blood test (FOBT) of the stool. You may have this test every year starting at age 19. ?Flexible sigmoidoscopy or colonoscopy. You may have a sigmoidoscopy every 5 years or a colonoscopy every 10 years starting at age 50. ?Prostate cancer screening. Recommendations will vary depending on your family history and other risks. ?Hepatitis C blood test. ?Hepatitis B blood test. ?Sexually transmitted disease (STD) testing. ?Diabetes screening. This is done by checking your blood sugar (glucose) after you have not eaten for a while (fasting). You may have this done every 1-3 years. ?Abdominal aortic aneurysm (AAA) screening. You may need this if you are a current or former smoker. ?Osteoporosis. You may be screened starting at age 59 if you are at high risk. ?Talk with your health care provider about your test results, treatment options, and if necessary, the need for more tests. ?Vaccines  ?Your health care provider may recommend certain vaccines, such as: ?Influenza vaccine. This is recommended every year. ?Tetanus, diphtheria, and acellular pertussis (Tdap, Td) vaccine. You may need a Td booster every 10 years. ?Zoster vaccine. You may need this after age 53. ?Pneumococcal 13-valent conjugate (PCV13) vaccine. One dose is  recommended after age 47. ?Pneumococcal polysaccharide (PPSV23) vaccine. One dose is  recommended after age 41. ?Talk to your health care provider about which screenings and vaccines you need and how often you need them. ?This information is not intended to replace advice given to you by your health care provider. Make sure you discuss any questions you have with your health care provider. ?Document Released: 07/06/2015 Document Revised: 02/27/2016 Document Reviewed: 04/10/2015 ?Elsevier Interactive Patient Education ? 2017 [employer]. ? ?Fall Prevention in the Home ?Falls can cause injuries. They can happen to people of all ages. There are many things you can do to make your home safe and to help prevent falls. ?What can I do on the outside of my home? ?Regularly fix the edges of walkways and driveways and fix any cracks. ?Remove anything that might make you trip as you walk through a door, such as a raised step or threshold. ?Trim any bushes or trees on the path to your home. ?Use bright outdoor lighting. ?Clear any walking paths of anything that might make someone trip, such as rocks or tools. ?Regularly check to see if handrails are loose or broken. Make sure that both sides of any steps have handrails. ?Any raised decks and porches should have guardrails on the edges. ?Have any leaves, snow, or ice cleared regularly. ?Use sand or salt on walking paths during winter. ?Clean up any spills in your garage right away. This includes oil or grease spills. ?What can I do in the bathroom? ?Use night lights. ?Install grab bars by the toilet and in the tub and shower. Do not use towel bars as grab bars. ?Use non-skid mats or decals in the tub or shower. ?If you need to sit down in the shower, use a plastic, non-slip stool. ?Keep the floor dry. Clean up any water that spills on the floor as soon as it happens. ?Remove soap buildup in the tub or shower regularly. ?Attach bath mats securely with double-sided non-slip rug tape. ?Do not have throw rugs and other things on the floor that can make you  trip. ?What can I do in the bedroom? ?Use night lights. ?Make sure that you have a light by your bed that is easy to reach. ?Do not use any sheets or blankets that are too big for your bed. They should not hang down onto the floor. ?Have a firm chair that has side arms. You can use this for support while you get dressed. ?Do not have throw rugs and other things on the floor that can make you trip. ?What can I do in the kitchen? ?Clean up any spills right away. ?Avoid walking on wet floors. ?Keep items that you use a lot in easy-to-reach places. ?If you need to reach something above you, use a strong step stool that has a grab bar. ?Keep electrical cords out of the way. ?Do not use floor polish or wax that makes floors slippery. If you must use wax, use non-skid floor wax. ?Do not have throw rugs and other things on the floor that can make you trip. ?What can I do with my stairs? ?Do not leave any items on the stairs. ?Make sure that there are handrails on both sides of the stairs and use them. Fix handrails that are broken or loose. Make sure that handrails are as long as the stairways. ?Check any carpeting to make sure that it is firmly attached to the stairs. Fix any carpet that is loose or worn. ?Avoid having  throw rugs at the top or bottom of the stairs. If you do have throw rugs, attach them to the floor with carpet tape. ?Make sure that you have a light switch at the top of the stairs and the bottom of the stairs. If you do not have them, ask someone to add them for you. ?What else can I do to help prevent falls? ?Wear shoes that: ?Do not have high heels. ?Have rubber bottoms. ?Are comfortable and fit you well. ?Are closed at the toe. Do not wear sandals. ?If you use a stepladder: ?Make sure that it is fully opened. Do not climb a closed stepladder. ?Make sure that both sides of the stepladder are locked into place. ?Ask someone to hold it for you, if possible. ?Clearly mark and make sure that you can  see: ?Any grab bars or handrails. ?First and last steps. ?Where the edge of each step is. ?Use tools that help you move around (mobility aids) if they are needed. These include: ?Canes. ?Walkers. ?Scooters. ?Crutches. ?Turn o

## 2021-10-17 NOTE — Progress Notes (Signed)
? ?Subjective:  ? Brandon Stone is a 76 y.o. male who presents for Medicare Annual/Subsequent preventive examination. ? ?I connected with  Brandon Stone on 10/17/21 by a audio enabled telemedicine application and verified that I am speaking with the correct person using two identifiers. ? ?Patient Location: Home ? ?Provider Location: Office/Clinic ? ?I discussed the limitations of evaluation and management by telemedicine. The patient expressed understanding and agreed to proceed.  ? ?Review of Systems    ? ?Cardiac Risk Factors include: advanced age (>26mn, >>36women);diabetes mellitus;dyslipidemia ? ?   ?Objective:  ?  ?There were no vitals filed for this visit. ?There is no height or weight on file to calculate BMI. ? ? ?  10/17/2021  ?  1:14 PM 10/07/2021  ? 10:13 AM 10/11/2020  ? 10:25 AM 06/06/2020  ? 10:57 AM 10/03/2019  ? 10:31 AM 07/01/2019  ?  4:14 PM 06/15/2019  ? 11:15 AM  ?Advanced Directives  ?Does Patient Have a Medical Advance Directive? Yes Yes No No No No No  ?Type of Advance Directive Living will Living will;Healthcare Power of Attorney       ?Does patient want to make changes to medical advance directive?  No - Patient declined       ?Copy of [place] Chart? No - copy requested Yes - validated most recent copy scanned in chart (See row information)       ?Would patient like information on creating a medical advance directive?   No - Patient declined No - Patient declined No - Patient declined  No - Patient declined  ? ? ?Current Medications (verified) ?Outpatient Encounter Medications as of 10/17/2021  ?Medication Sig  ? acetaminophen (TYLENOL) 325 MG tablet Take 325 mg by mouth every 6 (six) hours as needed for mild pain.  ? albuterol (VENTOLIN HFA) 108 (90 Base) MCG/ACT inhaler Inhale 2 puffs into the lungs every 6 (six) hours as needed.  ? allopurinol (ZYLOPRIM) 300 MG tablet TAKE 1 TABLET EVERY DAY  ? amLODipine (NORVASC) 10 MG tablet Take 1 tablet (10 mg total) by mouth  daily.  ? aspirin EC 81 MG tablet Take 81 mg by mouth daily.  ? atorvastatin (LIPITOR) 40 MG tablet TAKE 1 TABLET EVERY DAY  ? glipiZIDE (GLUCOTROL XL) 10 MG 24 hr tablet Take 1 tablet (10 mg total) by mouth daily with breakfast.  ? glipiZIDE (GLUCOTROL XL) 5 MG 24 hr tablet TAKE 1 TABLET (5 MG TOTAL) BY MOUTH DAILY WITH BREAKFAST. TAKE WITH 10 MG TABLET FOR 15 MG TOTAL  ? glucose blood (TRUE METRIX BLOOD GLUCOSE TEST) test strip Use as directed once a day.  Dx code: E11.9  ? icosapent Ethyl (VASCEPA) 1 g capsule Take 2 capsules (2 g total) by mouth 2 (two) times daily.  ? isosorbide mononitrate (IMDUR) 60 MG 24 hr tablet TAKE 1 TABLET EVERY DAY  ? loratadine (CLARITIN) 10 MG tablet Take 10 mg by mouth daily as needed for allergies.  ? metFORMIN (GLUCOPHAGE) 500 MG tablet TAKE 1 TABLET TWICE DAILY WITH MEALS  ? metoprolol succinate (TOPROL-XL) 25 MG 24 hr tablet TAKE 1 TABLET IN THE MORNING  AND AT BEDTIME. Please keep upcoming appt. With Dr. RHarrington Challengerin order to receive further refills. Thank You.  ? MULTIPLE VITAMIN-FOLIC ACID PO Take 1 tablet by mouth daily. Contains 4322mg folic acid  ? nitroGLYCERIN (NITROSTAT) 0.4 MG SL tablet Place 1 tablet (0.4 mg total) under the tongue every 5 (five)  minutes as needed for chest pain.  ? omeprazole (PRILOSEC) 20 MG capsule TAKE 1 CAPSULE TWICE DAILY  ? potassium chloride SA (KLOR-CON) 20 MEQ tablet TAKE 1 TABLET EVERY DAY  ? REPATHA SURECLICK 341 MG/ML SOAJ INJECT 1 PEN INTO THE SKIN EVERY 14 (FOURTEEN) DAYS.  ? triamcinolone cream (KENALOG) 0.1 % Apply 1 application topically 2 (two) times daily. Use as needed for dry or itchy skin  ? TRUEplus Lancets 33G MISC Use as directed once a day.  Dx code: E11.9  ? valsartan-hydrochlorothiazide (DIOVAN-HCT) 160-25 MG tablet TAKE 1 TABLET EVERY DAY  ? [DISCONTINUED] metFORMIN (GLUCOPHAGE) 500 MG tablet Take 1 tablet by mouth in the morning and at bedtime.  ? ?No facility-administered encounter medications on file as of 10/17/2021.   ? ? ?Allergies (verified) ?Other, Rosuvastatin, Ticlopidine hcl, Black pepper [piper], and Codeine  ? ?History: ?Past Medical History:  ?Diagnosis Date  ? Anginal pain ([place])   ? Arthritis   ? Barrett's esophagus   ? BPH (benign prostatic hypertrophy)   ? CAD (coronary artery disease)   ?  a. CABG 1991;  b.  [place];  c.  S/P PTCA  stents to SVG ot OM last in 2003;  d.  Redo CABG x 2 in 2004 (VG->OM->LCX, LRA->PDA);  e. 12/2011 Cath: 3vd, sev LM into LCX dzs, VG->OM 100, LIMA->DIAG->LAD patent, Rad Art->PDA patent, EF 45%, Med Rx.  ? Diverticulosis   ? DM (diabetes mellitus) ([place])   ? Dyslipidemia   ? Dyspnea   ? Gallstones   ? GERD (gastroesophageal reflux disease)   ? Gout   ? History of hiatal hernia   ? HTN (hypertension)   ? Hyperlipidemia   ? Leukemia in remission [facility])   ? Psoriasis   ? Rheumatic fever   ? as a child  ? S/P coronary artery stent placement 1998  ? TIA (transient ischemic attack)   ? FOUND ON MRI  PER PATIENT-     NO PROBLEM  ? ?Past Surgical History:  ?Procedure Laterality Date  ? APPENDECTOMY    ? CHOLECYSTECTOMY N/A 09/18/2016  ? Procedure: LAPAROSCOPIC CHOLECYSTECTOMY WITH INTRAOPERATIVE CHOLANGIOGRAM;  Surgeon: Jackolyn Confer, MD;  Location: [facility];  Service: General;  Laterality: N/A;  ? COLONOSCOPY  07/2013  ? CORONARY ANGIOPLASTY    ? STENTS  ? CORONARY ARTERY BYPASS GRAFT    ? 1991 (LIMA to LAD/Diag;  SVG to RCA;  SVG to OM)  ? CORONARY ARTERY BYPASS GRAFT    ? 2004 (L radial to PDA; SVG to OM1/ distal LCx)  ? TONSILLECTOMY AND ADENOIDECTOMY    ? UMBILICAL HERNIA REPAIR    ? ?Family History  ?Problem Relation Age of Onset  ? Heart disease Mother   ? Hypertension Mother   ? Hyperlipidemia Mother   ? Diabetes Mother   ? Skin cancer Mother   ?     skin  ? Heart disease Father   ? Hypertension Father   ? Hyperlipidemia Father   ? Diabetes Father   ? Stroke Sister   ? Stroke Brother   ? Colon cancer Neg Hx   ? ?Social History  ? ?Socioeconomic History  ? Marital status: Married  ?  Spouse  name: Not on file  ? Number of children: 2  ? Years of education: Not on file  ? Highest education level: Not on file  ?Occupational History  ? Occupation: retired  ?Tobacco Use  ? Smoking status: Former  ?  Years: 8.00  ?  Types: Cigarettes  ?  Quit date: 06/24/1971  ?  Years since quitting: 50.3  ? Smokeless tobacco: Never  ?Substance and Sexual Activity  ? Alcohol use: No  ? Drug use: No  ? Sexual activity: Yes  ?Other Topics Concern  ? Not on file  ?Social History Narrative  ? Not on file  ? ?Social Determinants of Health  ? ?Financial Resource Strain: Not on file  ?Food Insecurity: Not on file  ?Transportation Needs: Not on file  ?Physical Activity: Not on file  ?Stress: Not on file  ?Social Connections: Not on file  ? ? ?Tobacco Counseling ?Counseling given: Not Answered ? ? ?Clinical Intake: ? ?Pre-visit preparation completed: Yes ? ?Pain : No/denies pain ? ?  ? ?Nutritional Risks: None ?Diabetes: Yes ?CBG done?: No ?Did pt. bring in CBG monitor from home?: No ? ?How often do you need to have someone help you when you read instructions, pamphlets, or other written materials from your doctor or pharmacy?: 1 - Never ? ?Diabetic?Yes ?Nutrition Risk Assessment: ? ?Has the patient had any N/V/D within the last 2 months?  No  ?Does the patient have any non-healing wounds?  No  ?Has the patient had any unintentional weight loss or weight gain?  No  ? ?Diabetes: ? ?Is the patient diabetic?  Yes  ?If diabetic, was a CBG obtained today?  No  ?Did the patient bring in their glucometer from home?  No  ?How often do you monitor your CBG's? Once a day.  ? ?Financial Strains and Diabetes Management: ? ?Are you having any financial strains with the device, your supplies or your medication? No .  ?Does the patient want to be seen by Chronic Care Management for management of their diabetes?  No  ?Would the patient like to be referred to a Nutritionist or for Diabetic Management?  No  ? ?Diabetic Exams: ? ?Diabetic Eye Exam:  Overdue for diabetic eye exam. Pt has been advised about the importance in completing this exam. Patient advised to call and schedule an eye exam. ?Diabetic Foot Exam: Completed 02/13/21   ? ?Interpreter Needed?:

## 2021-10-21 NOTE — Progress Notes (Signed)
Patient ID: Brandon Stone                 DOB: [birth year]                      MRN: [id number] ? ? ? ? ?HPI: ?Brandon Stone is a 76 y.o. male referred by Dr. Harrington Challenger to HTN clinic. PMH is significant for CAD (s/p CABG in 1999, redo CABG in 2004), T2DM, HLD, HTN, TIA. BP was 136/84 at last visit with Dr. Harrington Challenger on 08/28/21. Reported his BP at home was in the 419Q systolic. Amlodipine was increased to 10 mg. BP at PCP visit 10/07/21 was 136/80.  ? ?Today, patient arrives in good spirits. Reports he had some dizziness when he first started amlodipine but this was around the same time he was started on glipizide and he thinks it was related to that. Denies any further dizziness. Denies headaches, swelling, blurred vision. He uses an upper arm cuff at home that he brought to his last visit and it read very closely to the clinic cuff. His BP cuff is upstairs though so he thinks his home readings may be high as he checks his BP right after he climbs the stairs.  ? ?Current HTN meds: amlodipine 10 mg daily (AM), valsartan-HCTZ 160-25 mg daily (AM), metoprolol succinate 25 mg BID ?Previously tried: triamterene-HCTZ ?BP goal: <130/80 mmHg ? ?Family History: The patient's family history includes Diabetes in his father and mother; Heart disease in his father and mother; Hyperlipidemia in his father and mother; Hypertension in his father and mother; Skin cancer in his mother; Stroke in his brother and sister.  ? ?Social History: The patient  reports that he quit smoking about 50 years ago. His smoking use included cigarettes. He has never used smokeless tobacco. He reports that he does not drink alcohol and does not use drugs.  ? ?Diet:  ?Breakfast: Cereal or eggs ?Lunch: Sandwich ?Dinner: Cutting back on potatoes; meat/vegetables ?Snacks: Orange, dill pickles/olives, sugar free applesauce ?Drinks: Water, avoids sodas/coffee ? ?Exercise: walking more - uses Health app on phone to track steps and has been more motivated lately to  increase his steps, tries to walk at least 30 minutes/day ? ?Home BP readings: 140-145/78. New upper arm cuff - tested here last visit, read very closely. Checks BP after walking up a flight of stairs.  ? ?Labs:  ?10/07/21: Scr 1.1, K 3.8 (valsartan-HCTZ 160-25 mg daily) ? ?Wt Readings from Last 3 Encounters:  ?10/07/21 224 lb 3.2 oz (101.7 kg)  ?09/04/21 223 lb (101.2 kg)  ?08/28/21 223 lb (101.2 kg)  ? ?BP Readings from Last 3 Encounters:  ?10/22/21 132/80  ?10/07/21 136/80  ?08/28/21 136/84  ? ?Pulse Readings from Last 3 Encounters:  ?10/22/21 71  ?10/07/21 75  ?08/28/21 75  ? ?Renal function: ?CrCl cannot be calculated (Unknown ideal weight.). ? ?Past Medical History:  ?Diagnosis Date  ? Anginal pain ([place])   ? Arthritis   ? Barrett's esophagus   ? BPH (benign prostatic hypertrophy)   ? CAD (coronary artery disease)   ?  a. CABG 1991;  b.  [place];  c.  S/P PTCA  stents to SVG ot OM last in 2003;  d.  Redo CABG x 2 in 2004 (VG->OM->LCX, LRA->PDA);  e. 12/2011 Cath: 3vd, sev LM into LCX dzs, VG->OM 100, LIMA->DIAG->LAD patent, Rad Art->PDA patent, EF 45%, Med Rx.  ? Diverticulosis   ? DM (diabetes mellitus) ([place])   ?  Dyslipidemia   ? Dyspnea   ? Gallstones   ? GERD (gastroesophageal reflux disease)   ? Gout   ? History of hiatal hernia   ? HTN (hypertension)   ? Hyperlipidemia   ? Leukemia in remission [facility])   ? Psoriasis   ? Rheumatic fever   ? as a child  ? S/P coronary artery stent placement 1998  ? TIA (transient ischemic attack)   ? FOUND ON MRI  PER PATIENT-     NO PROBLEM  ? ?Current Outpatient Medications on File Prior to Visit  ?Medication Sig Dispense Refill  ? acetaminophen (TYLENOL) 325 MG tablet Take 325 mg by mouth every 6 (six) hours as needed for mild pain.    ? albuterol (VENTOLIN HFA) 108 (90 Base) MCG/ACT inhaler Inhale 2 puffs into the lungs every 6 (six) hours as needed. 18 g 0  ? allopurinol (ZYLOPRIM) 300 MG tablet TAKE 1 TABLET EVERY DAY 90 tablet 2  ? amLODipine (NORVASC) 10 MG tablet  Take 1 tablet (10 mg total) by mouth daily. 90 tablet 3  ? aspirin EC 81 MG tablet Take 81 mg by mouth daily.    ? atorvastatin (LIPITOR) 40 MG tablet TAKE 1 TABLET EVERY DAY 90 tablet 2  ? glipiZIDE (GLUCOTROL XL) 10 MG 24 hr tablet Take 1 tablet (10 mg total) by mouth daily with breakfast. 90 tablet 3  ? glipiZIDE (GLUCOTROL XL) 5 MG 24 hr tablet TAKE 1 TABLET (5 MG TOTAL) BY MOUTH DAILY WITH BREAKFAST. TAKE WITH 10 MG TABLET FOR 15 MG TOTAL 60 tablet 1  ? glucose blood (TRUE METRIX BLOOD GLUCOSE TEST) test strip Use as directed once a day.  Dx code: E11.9 100 each 1  ? icosapent Ethyl (VASCEPA) 1 g capsule Take 2 capsules (2 g total) by mouth 2 (two) times daily. 360 capsule 3  ? isosorbide mononitrate (IMDUR) 60 MG 24 hr tablet TAKE 1 TABLET EVERY DAY 90 tablet 3  ? loratadine (CLARITIN) 10 MG tablet Take 10 mg by mouth daily as needed for allergies.    ? metFORMIN (GLUCOPHAGE) 500 MG tablet TAKE 1 TABLET TWICE DAILY WITH MEALS 180 tablet 1  ? metoprolol succinate (TOPROL-XL) 25 MG 24 hr tablet TAKE 1 TABLET IN THE MORNING  AND AT BEDTIME. Please keep upcoming appt. With Dr. Harrington Challenger in order to receive further refills. Thank You. 180 tablet 0  ? MULTIPLE VITAMIN-FOLIC ACID PO Take 1 tablet by mouth daily. Contains 017 mg folic acid    ? nitroGLYCERIN (NITROSTAT) 0.4 MG SL tablet Place 1 tablet (0.4 mg total) under the tongue every 5 (five) minutes as needed for chest pain. 25 tablet 3  ? omeprazole (PRILOSEC) 20 MG capsule TAKE 1 CAPSULE TWICE DAILY 180 capsule 3  ? potassium chloride SA (KLOR-CON) 20 MEQ tablet TAKE 1 TABLET EVERY DAY 90 tablet 1  ? REPATHA SURECLICK 494 MG/ML SOAJ INJECT 1 PEN INTO THE SKIN EVERY 14 (FOURTEEN) DAYS. 6 mL 3  ? triamcinolone cream (KENALOG) 0.1 % Apply 1 application topically 2 (two) times daily. Use as needed for dry or itchy skin 30 g 2  ? TRUEplus Lancets 33G MISC Use as directed once a day.  Dx code: E11.9 100 each 1  ? valsartan-hydrochlorothiazide (DIOVAN-HCT) 160-25 MG  tablet TAKE 1 TABLET EVERY DAY 90 tablet 3  ? ?No current facility-administered medications on file prior to visit.  ? ?Allergies  ?Allergen Reactions  ? Other Hives  ?  Intolerance to strong pain  medications ? ?Rash to opsite and tegaderm tape.  ? Rosuvastatin   ?  Nausea, abdominal discomfort  ? Ticlopidine Hcl Hives and Itching  ? Black Pepper [Piper] Palpitations  ?  HEART PALPITATIONS ?HEART PALPITATIONS  ? Codeine Palpitations and Other (See Comments)  ?  Wild dreams, palpitations ?"wild dreams"  ? ? ?Assessment/Plan: ? ?1. Hypertension - BP is close to goal <130/80 mmHg and improved from last visit. Home readings are difficult to assess considering he is not resting adequately before checking (walks up flight of stairs to get to his BP cuff). Will continue current medications today: amlodipine 10 mg daily, valsartan-HCTZ 160-25 mg daily, metoprolol succinate 25 mg BID. Counseled patient to check BP after about 10 minutes of resting and to bring log of readings with cuff to next visit. Will follow up in 1 month to ensure both home and clinic readings are at goal. If elevated, could increase valsartan.  ? ?Brandon Stone, PharmD ?PGY2 Ambulatory Care Pharmacy Resident ?10/22/2021 12:37 PM ?

## 2021-10-22 ENCOUNTER — Ambulatory Visit: Payer: Medicare HMO | Admitting: Student-PharmD

## 2021-10-22 VITALS — BP 132/80 | HR 71

## 2021-10-22 DIAGNOSIS — I10 Essential (primary) hypertension: Secondary | ICD-10-CM

## 2021-10-22 NOTE — Patient Instructions (Addendum)
It was nice to see you today! ? ?Your goal blood pressure is less than 130/80 mmHg. In clinic, your blood pressure was 132/80 mmHg. ? ?Medication Changes: ? ?Continue your current medications.  ? ?Monitor blood pressure at home daily and keep a log (on your phone or piece of paper) to bring with you to your next visit. Write down date, time, blood pressure and pulse. Please bring your blood pressure cuff and log of readings to your next visit.  ? ?Keep up the good work with diet and exercise. Aim for a diet full of vegetables, fruit and lean meats (chicken, [place], fish). Try to limit salt intake by eating fresh or frozen vegetables (instead of canned), rinse canned vegetables prior to cooking and do not add any additional salt to meals.  ? ?

## 2021-11-26 ENCOUNTER — Ambulatory Visit: Payer: Medicare HMO | Admitting: Pharmacist

## 2021-11-26 VITALS — BP 128/70 | HR 75

## 2021-11-26 DIAGNOSIS — I10 Essential (primary) hypertension: Secondary | ICD-10-CM | POA: Diagnosis not present

## 2021-11-26 MED ORDER — AMLODIPINE BESYLATE 10 MG PO TABS: 10.0000 mg | ORAL_TABLET | Freq: Every day | ORAL | 3 refills | Status: DC

## 2021-11-26 NOTE — Progress Notes (Signed)
Patient ID: Brandon Stone                 DOB: 10-09-1945                      MRN: [id number]     HPI: Brandon Stone is a 76 y.o. male referred by Dr. Harrington Challenger to HTN clinic. PMH is significant for CAD (s/p CABG in 1999, redo CABG in 2004), T2DM, HLD, HTN, and TIA. BP was 136/84 at last visit with Dr. Harrington Challenger on 08/28/21. Reported his BP at home was in the 048G systolic. Amlodipine was increased to 10 mg. BP at PCP visit 10/07/21 was 136/80. BP at last visit with PharmD on 10/22/21 was 132/80 and patient reported elevated home BPs of 140s/70s, however he had been checking his BP immediately after walking upstairs without resting first. No changes were made at that time.  Pt presented today in good spirits. Pt reports improvement in BP measurement technique with adequate rest prior to measuring blood pressure. Home BP measurements listed below were taken at various times of the day. BP measured in the morning before medications are significantly higher than readings checked after he takes his BP medications. Pt denies caffeine and NSAID use.  Current HTN meds: amlodipine 10 mg daily (AM), valsartan-HCTZ 160-25 mg daily (AM), metoprolol succinate 25 mg BID Previously tried: triamterene-HCTZ (stopped due to cost) BP goal: <130/80 mmHg  Family History: The patient's family history includes Diabetes in his father and mother; Heart disease in his father and mother; Hyperlipidemia in his father and mother; Hypertension in his father and mother; Skin cancer in his mother; Stroke in his brother and sister.   Social History: The patient  reports that he quit smoking about 50 years ago. His smoking use included cigarettes. He has never used smokeless tobacco. He reports that he does not drink alcohol and does not use drugs.   Diet: No changes in diet. Breakfast: Cereal or eggs Lunch: Sandwich Dinner: Cutting back on potatoes; meat/vegetables Snacks: Orange, dill pickles/olives, sugar free applesauce Drinks: Water,  avoids sodas/coffee  Exercise: Uses health app on phone to track steps and has been more motivated lately to increase his steps, tries to walk at least 30 minutes/day  Home BP readings: BP in clinic and home cuff last visit correlated well.  Multiple readings taken before BP medications consistently within 160s/80. Pulse is consistently 60-80  May 4 165/99 morning before meds, 133/71 PM May 13 133/70 afternoon May 24 136/77 afternoon May 26 113/64 morning June 3 144/81 evening June 4 144/82 morning June 5 144/77 moring  Labs:  10/07/21: Scr 1.1, K 3.8 (valsartan-HCTZ 160-25 mg daily)  Wt Readings from Last 3 Encounters:  10/07/21 224 lb 3.2 oz (101.7 kg)  09/04/21 223 lb (101.2 kg)  08/28/21 223 lb (101.2 kg)   BP Readings from Last 3 Encounters:  10/22/21 132/80  10/07/21 136/80  08/28/21 136/84   Pulse Readings from Last 3 Encounters:  10/22/21 71  10/07/21 75  08/28/21 75   Renal function: CrCl cannot be calculated (Patient's most recent lab result is older than the maximum 21 days allowed.).  Past Medical History:  Diagnosis Date   Anginal pain ([place])    Arthritis    Barrett's esophagus    BPH (benign prostatic hypertrophy)    CAD (coronary artery disease)     a. CABG 1991;  b.  [place];  c.  S/P PTCA  stents to  SVG ot OM last in 2003;  d.  Redo CABG x 2 in 2004 (VG->OM->LCX, LRA->PDA);  e. 12/2011 Cath: 3vd, sev LM into LCX dzs, VG->OM 100, LIMA->DIAG->LAD patent, Rad Art->PDA patent, EF 45%, Med Rx.   Diverticulosis    DM (diabetes mellitus) ([place])    Dyslipidemia    Dyspnea    Gallstones    GERD (gastroesophageal reflux disease)    Gout    History of hiatal hernia    HTN (hypertension)    Hyperlipidemia    Leukemia in remission ([place])    Psoriasis    Rheumatic fever    as a child   S/P coronary artery stent placement 1998   TIA (transient ischemic attack)    FOUND ON MRI  PER PATIENT-     NO PROBLEM   Current Outpatient Medications on File Prior to  Visit  Medication Sig Dispense Refill   acetaminophen (TYLENOL) 325 MG tablet Take 325 mg by mouth every 6 (six) hours as needed for mild pain.     albuterol (VENTOLIN HFA) 108 (90 Base) MCG/ACT inhaler Inhale 2 puffs into the lungs every 6 (six) hours as needed. 18 g 0   allopurinol (ZYLOPRIM) 300 MG tablet TAKE 1 TABLET EVERY DAY 90 tablet 2   amLODipine (NORVASC) 10 MG tablet Take 1 tablet (10 mg total) by mouth daily. 90 tablet 3   aspirin EC 81 MG tablet Take 81 mg by mouth daily.     atorvastatin (LIPITOR) 40 MG tablet TAKE 1 TABLET EVERY DAY 90 tablet 2   glipiZIDE (GLUCOTROL XL) 10 MG 24 hr tablet Take 1 tablet (10 mg total) by mouth daily with breakfast. 90 tablet 3   glipiZIDE (GLUCOTROL XL) 5 MG 24 hr tablet TAKE 1 TABLET (5 MG TOTAL) BY MOUTH DAILY WITH BREAKFAST. TAKE WITH 10 MG TABLET FOR 15 MG TOTAL 60 tablet 1   glucose blood (TRUE METRIX BLOOD GLUCOSE TEST) test strip Use as directed once a day.  Dx code: E11.9 100 each 1   icosapent Ethyl (VASCEPA) 1 g capsule Take 2 capsules (2 g total) by mouth 2 (two) times daily. 360 capsule 3   isosorbide mononitrate (IMDUR) 60 MG 24 hr tablet TAKE 1 TABLET EVERY DAY 90 tablet 3   loratadine (CLARITIN) 10 MG tablet Take 10 mg by mouth daily as needed for allergies.     metFORMIN (GLUCOPHAGE) 500 MG tablet TAKE 1 TABLET TWICE DAILY WITH MEALS 180 tablet 1   metoprolol succinate (TOPROL-XL) 25 MG 24 hr tablet TAKE 1 TABLET IN THE MORNING  AND AT BEDTIME. Please keep upcoming appt. With Dr. Harrington Challenger in order to receive further refills. Thank You. 180 tablet 0   MULTIPLE VITAMIN-FOLIC ACID PO Take 1 tablet by mouth daily. Contains 536 mg folic acid     nitroGLYCERIN (NITROSTAT) 0.4 MG SL tablet Place 1 tablet (0.4 mg total) under the tongue every 5 (five) minutes as needed for chest pain. 25 tablet 3   omeprazole (PRILOSEC) 20 MG capsule TAKE 1 CAPSULE TWICE DAILY 180 capsule 3   potassium chloride SA (KLOR-CON) 20 MEQ tablet TAKE 1 TABLET EVERY DAY  90 tablet 1   REPATHA SURECLICK 644 MG/ML SOAJ INJECT 1 PEN INTO THE SKIN EVERY 14 (FOURTEEN) DAYS. 6 mL 3   triamcinolone cream (KENALOG) 0.1 % Apply 1 application topically 2 (two) times daily. Use as needed for dry or itchy skin 30 g 2   TRUEplus Lancets 33G MISC Use as directed once a  day.  Dx code: E11.9 100 each 1   valsartan-hydrochlorothiazide (DIOVAN-HCT) 160-25 MG tablet TAKE 1 TABLET EVERY DAY 90 tablet 3   No current facility-administered medications on file prior to visit.   Allergies  Allergen Reactions   Other Hives    Intolerance to strong pain medications  Rash to opsite and tegaderm tape.   Rosuvastatin     Nausea, abdominal discomfort   Ticlopidine Hcl Hives and Itching   Black Pepper [Piper] Palpitations    HEART PALPITATIONS HEART PALPITATIONS   Codeine Palpitations and Other (See Comments)    Wild dreams, palpitations "wild dreams"    Assessment/Plan:  1. Hypertension - BP of 128/70 is at goal of <130/80 mmHg today in clinic on amlodipine 10 mg daily (AM), valsartan-HCTZ 160-25 mg daily (AM), and metoprolol succinate 25 mg twice daily. Home BP measurements are close to goal, however BP readings are higher in the morning before taking medications. Pt is agreeable to changing amlodipine to night time dosing for more even distribution of BP lowering effect. Continue all other medications as noted above. Pt instructed to reach out to clinic if home BP readings are consistently >140/80, at which time increasing valsartan dose can be considered.   Pt seen with Brandon Stone, PGY1 pharmacy resident  Brandon Stone, PharmD, BCACP, [place] [address] [place],  [address] Phone: [phone]; Fax: [phone] 11/26/2021 10:40 AM

## 2021-11-26 NOTE — Patient Instructions (Addendum)
It was nice to see you today!  Your blood pressure in clinic is excellent and at goal < 130/39mHg  Continue taking your current medications but move your amlodipine to evening dosing  Call clinic if your top blood pressure reading is consistently over 140 and we can increase your valsartan dose #[phone]

## 2021-12-02 ENCOUNTER — Inpatient Hospital Stay: Payer: Medicare HMO | Attending: Hematology & Oncology

## 2021-12-02 ENCOUNTER — Encounter: Payer: Self-pay | Admitting: Hematology & Oncology

## 2021-12-02 ENCOUNTER — Other Ambulatory Visit: Payer: Self-pay | Admitting: Lab

## 2021-12-02 ENCOUNTER — Inpatient Hospital Stay: Payer: Medicare HMO | Admitting: Hematology & Oncology

## 2021-12-02 VITALS — Ht 71.0 in | Wt 221.1 lb

## 2021-12-02 DIAGNOSIS — C9110 Chronic lymphocytic leukemia of B-cell type not having achieved remission: Secondary | ICD-10-CM

## 2021-12-02 DIAGNOSIS — E119 Type 2 diabetes mellitus without complications: Secondary | ICD-10-CM | POA: Diagnosis not present

## 2021-12-02 DIAGNOSIS — I10 Essential (primary) hypertension: Secondary | ICD-10-CM | POA: Diagnosis not present

## 2021-12-02 DIAGNOSIS — J40 Bronchitis, not specified as acute or chronic: Secondary | ICD-10-CM | POA: Diagnosis not present

## 2021-12-02 LAB — CBC WITH DIFFERENTIAL (CANCER CENTER ONLY)
Abs Immature Granulocytes: 0.03 10*3/uL (ref 0.00–0.07)
Basophils Absolute: 0 10*3/uL (ref 0.0–0.1)
Basophils Relative: 0 %
Eosinophils Absolute: 0.2 10*3/uL (ref 0.0–0.5)
Eosinophils Relative: 1 %
HCT: 43.7 % (ref 39.0–52.0)
Hemoglobin: 14.7 g/dL (ref 13.0–17.0)
Immature Granulocytes: 0 %
Lymphocytes Relative: 64 %
Lymphs Abs: 8 10*3/uL — ABNORMAL HIGH (ref 0.7–4.0)
MCH: 30.9 pg (ref 26.0–34.0)
MCHC: 33.6 g/dL (ref 30.0–36.0)
MCV: 91.8 fL (ref 80.0–100.0)
Monocytes Absolute: 0.8 10*3/uL (ref 0.1–1.0)
Monocytes Relative: 7 %
Neutro Abs: 3.5 10*3/uL (ref 1.7–7.7)
Neutrophils Relative %: 28 %
Platelet Count: 223 10*3/uL (ref 150–400)
RBC: 4.76 MIL/uL (ref 4.22–5.81)
RDW: 12.7 % (ref 11.5–15.5)
WBC Count: 12.6 10*3/uL — ABNORMAL HIGH (ref 4.0–10.5)
nRBC: 0 % (ref 0.0–0.2)

## 2021-12-02 LAB — CMP (CANCER CENTER ONLY)
ALT: 20 U/L (ref 0–44)
AST: 18 U/L (ref 15–41)
Albumin: 4.5 g/dL (ref 3.5–5.0)
Alkaline Phosphatase: 60 U/L (ref 38–126)
Anion gap: 8 (ref 5–15)
BUN: 22 mg/dL (ref 8–23)
CO2: 30 mmol/L (ref 22–32)
Calcium: 9.9 mg/dL (ref 8.9–10.3)
Chloride: 104 mmol/L (ref 98–111)
Creatinine: 1.28 mg/dL — ABNORMAL HIGH (ref 0.61–1.24)
GFR, Estimated: 59 mL/min — ABNORMAL LOW (ref >60)
Glucose, Bld: 149 mg/dL — ABNORMAL HIGH (ref 70–99)
Potassium: 3.8 mmol/L (ref 3.5–5.1)
Sodium: 142 mmol/L (ref 135–145)
Total Bilirubin: 0.8 mg/dL (ref 0.3–1.2)
Total Protein: 6.6 g/dL (ref 6.5–8.1)

## 2021-12-02 LAB — LACTATE DEHYDROGENASE: LDH: 127 U/L (ref 98–192)

## 2021-12-02 NOTE — Progress Notes (Signed)
Hematology and Oncology Follow Up Visit  Brandon Stone [id number] 08/29/1945 76 y.o. 12/02/2021   Principle Diagnosis:  Stage A CLL  Current Therapy:   Observation     Interim History:  Brandon Stone is back for follow-up.  We last saw him back in December.  Since then, he has been doing pretty well.  He needs to lose a little bit of weight.  He does have diabetes.  He does have high blood pressure.  He has had no problems with swollen lymph nodes.  He has had no issues with COVID since we last saw him.  He has had no problems with nausea or vomiting.  He was placed on that new diabetic/weight loss drug- Mounjaro -cannot tolerate this.  He has had no issues with rashes.  There is been no leg swelling.  Overall, I would say his performance status is probably ECOG 0.     Medications:  Current Outpatient Medications:    acetaminophen (TYLENOL) 325 MG tablet, Take 325 mg by mouth every 6 (six) hours as needed for mild pain., Disp: , Rfl:    allopurinol (ZYLOPRIM) 300 MG tablet, TAKE 1 TABLET EVERY DAY, Disp: 90 tablet, Rfl: 2   amLODipine (NORVASC) 10 MG tablet, Take 1 tablet (10 mg total) by mouth daily., Disp: 90 tablet, Rfl: 3   aspirin EC 81 MG tablet, Take 81 mg by mouth daily., Disp: , Rfl:    atorvastatin (LIPITOR) 40 MG tablet, TAKE 1 TABLET EVERY DAY, Disp: 90 tablet, Rfl: 2   glipiZIDE (GLUCOTROL XL) 10 MG 24 hr tablet, Take 1 tablet (10 mg total) by mouth daily with breakfast., Disp: 90 tablet, Rfl: 3   glipiZIDE (GLUCOTROL XL) 5 MG 24 hr tablet, TAKE 1 TABLET (5 MG TOTAL) BY MOUTH DAILY WITH BREAKFAST. TAKE WITH 10 MG TABLET FOR 15 MG TOTAL, Disp: 60 tablet, Rfl: 1   glucose blood (TRUE METRIX BLOOD GLUCOSE TEST) test strip, Use as directed once a day.  Dx code: E11.9, Disp: 100 each, Rfl: 1   icosapent Ethyl (VASCEPA) 1 g capsule, Take 2 capsules (2 g total) by mouth 2 (two) times daily., Disp: 360 capsule, Rfl: 3   isosorbide mononitrate (IMDUR) 60 MG 24 hr tablet, TAKE 1  TABLET EVERY DAY, Disp: 90 tablet, Rfl: 3   loratadine (CLARITIN) 10 MG tablet, Take 10 mg by mouth daily as needed for allergies., Disp: , Rfl:    metFORMIN (GLUCOPHAGE) 500 MG tablet, TAKE 1 TABLET TWICE DAILY WITH MEALS, Disp: 180 tablet, Rfl: 1   metoprolol succinate (TOPROL-XL) 25 MG 24 hr tablet, TAKE 1 TABLET IN THE MORNING  AND AT BEDTIME. Please keep upcoming appt. With Dr. Harrington Challenger in order to receive further refills. Thank You., Disp: 180 tablet, Rfl: 0   MULTIPLE VITAMIN-FOLIC ACID PO, Take 1 tablet by mouth daily. Contains 025 mg folic acid, Disp: , Rfl:    omeprazole (PRILOSEC) 20 MG capsule, TAKE 1 CAPSULE TWICE DAILY, Disp: 180 capsule, Rfl: 3   potassium chloride SA (KLOR-CON) 20 MEQ tablet, TAKE 1 TABLET EVERY DAY, Disp: 90 tablet, Rfl: 1   REPATHA SURECLICK 852 MG/ML SOAJ, INJECT 1 PEN INTO THE SKIN EVERY 14 (FOURTEEN) DAYS., Disp: 6 mL, Rfl: 3   triamcinolone cream (KENALOG) 0.1 %, Apply 1 application topically 2 (two) times daily. Use as needed for dry or itchy skin (Patient taking differently: Apply 1 application  topically daily. Use as needed for dry or itchy skin), Disp: 30 g, Rfl: 2  TRUEplus Lancets 33G MISC, Use as directed once a day.  Dx code: E11.9, Disp: 100 each, Rfl: 1   valsartan-hydrochlorothiazide (DIOVAN-HCT) 160-25 MG tablet, TAKE 1 TABLET EVERY DAY, Disp: 90 tablet, Rfl: 3   albuterol (VENTOLIN HFA) 108 (90 Base) MCG/ACT inhaler, Inhale 2 puffs into the lungs every 6 (six) hours as needed. (Patient not taking: Reported on 12/02/2021), Disp: 18 g, Rfl: 0   nitroGLYCERIN (NITROSTAT) 0.4 MG SL tablet, Place 1 tablet (0.4 mg total) under the tongue every 5 (five) minutes as needed for chest pain. (Patient not taking: Reported on 12/02/2021), Disp: 25 tablet, Rfl: 3  Allergies:  Allergies  Allergen Reactions   Other Hives    Intolerance to strong pain medications  Rash to opsite and tegaderm tape.   Rosuvastatin     Nausea, abdominal discomfort   Ticlopidine Hcl  Hives and Itching   Black Pepper [Piper] Palpitations    HEART PALPITATIONS HEART PALPITATIONS   Codeine Palpitations and Other (See Comments)    Wild dreams, palpitations "wild dreams"    Past Medical History, Surgical history, Social history, and Family History were reviewed and updated.  Review of Systems: Review of Systems  Constitutional: Negative.   HENT:  Negative.    Eyes: Negative.   Respiratory: Negative.    Cardiovascular: Negative.   Gastrointestinal: Negative.   Endocrine: Negative.   Genitourinary: Negative.    Musculoskeletal: Negative.   Skin: Negative.   Neurological: Negative.   Hematological: Negative.   Psychiatric/Behavioral: Negative.      Physical Exam:  height is '5\' 11"'$  (1.803 m) and weight is 221 lb 1.9 oz (100.3 kg).   Wt Readings from Last 3 Encounters:  12/02/21 221 lb 1.9 oz (100.3 kg)  10/07/21 224 lb 3.2 oz (101.7 kg)  09/04/21 223 lb (101.2 kg)    Physical Exam Vitals reviewed.  HENT:     Head: Normocephalic and atraumatic.  Eyes:     Pupils: Pupils are equal, round, and reactive to light.  Cardiovascular:     Rate and Rhythm: Normal rate and regular rhythm.     Heart sounds: Normal heart sounds.  Pulmonary:     Effort: Pulmonary effort is normal.     Breath sounds: Normal breath sounds.  Abdominal:     General: Bowel sounds are normal.     Palpations: Abdomen is soft.  Musculoskeletal:        General: No tenderness or deformity. Normal range of motion.     Cervical back: Normal range of motion.  Lymphadenopathy:     Cervical: No cervical adenopathy.  Skin:    General: Skin is warm and dry.     Findings: No erythema or rash.  Neurological:     Mental Status: He is alert and oriented to person, place, and time.  Psychiatric:        Behavior: Behavior normal.        Thought Content: Thought content normal.        Judgment: Judgment normal.      Lab Results  Component Value Date   WBC 12.6 (H) 12/02/2021   HGB 14.7  12/02/2021   HCT 43.7 12/02/2021   MCV 91.8 12/02/2021   PLT 223 12/02/2021     Chemistry      Component Value Date/Time   NA 142 12/02/2021 1029   NA 143 06/01/2020 0900   NA 143 06/11/2017 1044   NA 140 04/27/2014 0815   K 3.8 12/02/2021 1029   K 3.2 (  L) 06/11/2017 1044   K 3.7 04/27/2014 0815   CL 104 12/02/2021 1029   CL 101 06/11/2017 1044   CO2 30 12/02/2021 1029   CO2 28 06/11/2017 1044   CO2 26 04/27/2014 0815   BUN 22 12/02/2021 1029   BUN 18 06/01/2020 0900   BUN 17 06/11/2017 1044   BUN 18.5 04/27/2014 0815   CREATININE 1.28 (H) 12/02/2021 1029   CREATININE 1.1 06/11/2017 1044   CREATININE 1.2 04/27/2014 0815      Component Value Date/Time   CALCIUM 9.9 12/02/2021 1029   CALCIUM 9.7 06/11/2017 1044   CALCIUM 9.8 04/27/2014 0815   ALKPHOS 60 12/02/2021 1029   ALKPHOS 83 06/11/2017 1044   ALKPHOS 71 04/27/2014 0815   AST 18 12/02/2021 1029   AST 18 04/27/2014 0815   ALT 20 12/02/2021 1029   ALT 32 06/11/2017 1044   ALT 25 04/27/2014 0815   BILITOT 0.8 12/02/2021 1029   BILITOT 0.67 04/27/2014 0815       Impression and Plan: Mr. Urbani is a 76 year old white male.  He has stage A CLL.  He has minimal lymphocytosis.    His white cell count is much better today.  I would expect that the last time we saw him, his white cell count could be elevated because of bronchitis.  We will get him back in 6 months.  I think everything looks good in 6 months, then maybe we go back to once a year visits.    Volanda Napoleon, MD 6/12/202311:26 AM

## 2021-12-03 LAB — IGG, IGA, IGM
IgA: 62 mg/dL (ref 61–437)
IgG (Immunoglobin G), Serum: 724 mg/dL (ref 603–1613)
IgM (Immunoglobulin M), Srm: 35 mg/dL (ref 15–143)

## 2021-12-15 NOTE — Progress Notes (Addendum)
[place] at [facility] [address], [place], [place] [address] [phone]  Date:  12/19/2021   Name:  Brandon Stone   DOB:  [birth year]   MRN:  [id number]  PCP:  Darreld Mclean, MD    Chief Complaint: Diabetes (Pt states he is here for a1c check. Last A1C and OV was drawn on 10/07/21./Concerns/ questions: bladder or low intestinal pain x 1 week/Eye exam: scheduled for July)   History of Present Illness:  Brandon Stone is a 76 y.o. very pleasant male patient who presents with the following:  Patient seen today for diabetes follow-up Most recent visit with myself was in April  History of diabetes, CLL, hyperlipidemia, hypertension, CAD, subdural hematoma March 2020 He underwent CABG about 20 years ago  Seen by Dr. Marin Olp earlier this month-stable, plan for 78-monthfollow-up. Seen by cardiology pharmacist to discuss hypertension also earlier this month  Glipizide 15 mg daily Metformin 500 twice daily  About one week ago he developed pain in her lower abdomen/ suprapubic area  Was not severe - but has been getting worse He is able to empty his bladder No pain with urination - he has pain before he will go No hematuria   He had been constipated recently and has been using miralax as well No diarrhea or vomiting  He has had a UTI in the past and this reminds him of that time He did have a UTI 9/22 Normal PSA earlier this year   Lab Results  Component Value Date   HGBA1C 7.9 (H) 10/07/2021   COVID series Shingles vaccine Eye exam Colonoscopy up-to-date Patient Active Problem List   Diagnosis Date Noted   AAA (abdominal aortic aneurysm) ([facility] 10/31/2019   Subdural hematoma ([facility] 09/08/2018   Diabetes mellitus without complication ([facility] 056/43/3295  Symptomatic cholelithiasis 09/18/2016   Chronic lymphocytic leukemia (CLL), B-cell ([facility] 05/13/2014   Stable angina ([place] 01/26/2012   Sore throat 11/03/2010    Hyperlipidemia 06/21/2008   HYPERTENSION, BENIGN 06/21/2008   CAD, NATIVE VESSEL 06/21/2008    Past Medical History:  Diagnosis Date   Anginal pain (H[facility]    Arthritis    Barrett's esophagus    BPH (benign prostatic hypertrophy)    CAD (coronary artery disease)     a. CABG 1991;  b.  [place]  c.  S/P PTCA  stents to SVG ot OM last in 2003;  d.  Redo CABG x 2 in 2004 (VG->OM->LCX, LRA->PDA);  e. 12/2011 Cath: 3vd, sev LM into LCX dzs, VG->OM 100, LIMA->DIAG->LAD patent, Rad Art->PDA patent, EF 45%, Med Rx.   Diverticulosis    DM (diabetes mellitus) ([facility]    Dyslipidemia    Dyspnea    Gallstones    GERD (gastroesophageal reflux disease)    Gout    History of hiatal hernia    HTN (hypertension)    Hyperlipidemia    Leukemia in remission ([facility]    Psoriasis    Rheumatic fever    as a child   S/P coronary artery stent placement 1998   TIA (transient ischemic attack)    FOUND ON MRI  PER PATIENT-     NO PROBLEM    Past Surgical History:  Procedure Laterality Date   APPENDECTOMY     CHOLECYSTECTOMY N/A 09/18/2016   Procedure: LAPAROSCOPIC CHOLECYSTECTOMY WITH INTRAOPERATIVE CHOLANGIOGRAM;  Surgeon: TJackolyn Confer MD;  [facility]  Service: General;  Laterality: N/A;  COLONOSCOPY  07/2013   CORONARY ANGIOPLASTY     STENTS   CORONARY ARTERY BYPASS GRAFT     1991 (LIMA to LAD/Diag;  SVG to RCA;  SVG to OM)   CORONARY ARTERY BYPASS GRAFT     2004 (L radial to PDA; SVG to OM1/ distal LCx)   TONSILLECTOMY AND ADENOIDECTOMY     UMBILICAL HERNIA REPAIR      Social History   Tobacco Use   Smoking status: Former    Packs/day: 0.50    Years: 8.00    Total pack years: 4.00    Types: Cigarettes    Quit date: 06/24/1971    Years since quitting: 50.5   Smokeless tobacco: Never  Vaping Use   Vaping Use: Never used  Substance Use Topics   Alcohol use: No   Drug use: No    Family History  Problem Relation Age of Onset   Heart disease Mother    Hypertension Mother     Hyperlipidemia Mother    Diabetes Mother    Skin cancer Mother        skin   Heart disease Father    Hypertension Father    Hyperlipidemia Father    Diabetes Father    Stroke Sister    Stroke Brother    Colon cancer Neg Hx     Allergies  Allergen Reactions   Other Hives    Intolerance to strong pain medications  Rash to opsite and tegaderm tape.   Rosuvastatin     Nausea, abdominal discomfort   Ticlopidine Hcl Hives and Itching   Black Pepper [Piper] Palpitations    HEART PALPITATIONS HEART PALPITATIONS   Codeine Palpitations and Other (See Comments)    Wild dreams, palpitations "wild dreams"    Medication list has been reviewed and updated.  Current Outpatient Medications on File Prior to Visit  Medication Sig Dispense Refill   acetaminophen (TYLENOL) 325 MG tablet Take 325 mg by mouth every 6 (six) hours as needed for mild pain.     albuterol (VENTOLIN HFA) 108 (90 Base) MCG/ACT inhaler Inhale 2 puffs into the lungs every 6 (six) hours as needed. 18 g 0   allopurinol (ZYLOPRIM) 300 MG tablet TAKE 1 TABLET EVERY DAY 90 tablet 2   amLODipine (NORVASC) 10 MG tablet Take 1 tablet (10 mg total) by mouth daily. 90 tablet 3   aspirin EC 81 MG tablet Take 81 mg by mouth daily.     atorvastatin (LIPITOR) 40 MG tablet TAKE 1 TABLET EVERY DAY 90 tablet 2   glipiZIDE (GLUCOTROL XL) 10 MG 24 hr tablet Take 1 tablet (10 mg total) by mouth daily with breakfast. 90 tablet 3   glipiZIDE (GLUCOTROL XL) 5 MG 24 hr tablet TAKE 1 TABLET (5 MG TOTAL) BY MOUTH DAILY WITH BREAKFAST. TAKE WITH 10 MG TABLET FOR 15 MG TOTAL 60 tablet 1   glucose blood (TRUE METRIX BLOOD GLUCOSE TEST) test strip Use as directed once a day.  Dx code: E11.9 100 each 1   icosapent Ethyl (VASCEPA) 1 g capsule Take 2 capsules (2 g total) by mouth 2 (two) times daily. 360 capsule 3   isosorbide mononitrate (IMDUR) 60 MG 24 hr tablet TAKE 1 TABLET EVERY DAY 90 tablet 3   loratadine (CLARITIN) 10 MG tablet Take 10 mg by  mouth daily as needed for allergies.     metFORMIN (GLUCOPHAGE) 500 MG tablet TAKE 1 TABLET TWICE DAILY WITH MEALS 180 tablet 1   metoprolol succinate (  TOPROL-XL) 25 MG 24 hr tablet TAKE 1 TABLET IN THE MORNING  AND AT BEDTIME. Please keep upcoming appt. With Dr. Harrington Challenger in order to receive further refills. Thank You. 180 tablet 0   MULTIPLE VITAMIN-FOLIC ACID PO Take 1 tablet by mouth daily. Contains 885 mg folic acid     nitroGLYCERIN (NITROSTAT) 0.4 MG SL tablet Place 1 tablet (0.4 mg total) under the tongue every 5 (five) minutes as needed for chest pain. 25 tablet 3   omeprazole (PRILOSEC) 20 MG capsule TAKE 1 CAPSULE TWICE DAILY 180 capsule 3   potassium chloride SA (KLOR-CON) 20 MEQ tablet TAKE 1 TABLET EVERY DAY 90 tablet 1   REPATHA SURECLICK 027 MG/ML SOAJ INJECT 1 PEN INTO THE SKIN EVERY 14 (FOURTEEN) DAYS. 6 mL 3   triamcinolone cream (KENALOG) 0.1 % Apply 1 application topically 2 (two) times daily. Use as needed for dry or itchy skin (Patient taking differently: Apply 1 application  topically daily. Use as needed for dry or itchy skin) 30 g 2   TRUEplus Lancets 33G MISC Use as directed once a day.  Dx code: E11.9 100 each 1   valsartan-hydrochlorothiazide (DIOVAN-HCT) 160-25 MG tablet TAKE 1 TABLET EVERY DAY 90 tablet 3   No current facility-administered medications on file prior to visit.    Review of Systems:  As per HPI- otherwise negative.   Physical Examination: Vitals:   12/19/21 0853  BP: 124/60  Pulse: 80  Resp: 18  Temp: 98.3 F (36.8 C)  SpO2: 96%   Vitals:   12/19/21 0853  Weight: 220 lb 12.8 oz (100.2 kg)  Height: 6' (1.829 m)   Body mass index is 29.95 kg/m. Ideal Body Weight: Weight in (lb) to have BMI = 25: 183.9  GEN: no acute distress.  Overweight, looks well HEENT: Atraumatic, Normocephalic.  Ears and Nose: No external deformity. CV: RRR, No M/G/R. No JVD. No thrill. No extra heart sounds. PULM: CTA B, no wheezes, crackles, rhonchi. No  retractions. No resp. distress. No accessory muscle use. ABD: S,  ND, +BS. No rebound. No HSM.  Central obesity, abdominal exam is generally negative though he does have some possible mild lower abdominal tenderness EXTR: No c/c/e PSYCH: Normally interactive. Conversant.  Normal testicles and penis, prostate is nontender Assessment and Plan: Diabetes mellitus without complication ([place]) - Plan: Hemoglobin X4J, Basic metabolic panel  Essential hypertension  Hyperlipidemia, unspecified hyperlipidemia type  Lower abdominal pain - Plan: POCT URINALYSIS DIP (CLINITEK), Urine Culture, CBC, CT Abdomen Pelvis W Contrast  Patient seen today for follow-up, lab work is pending as above.  However, he also notes lower abdominal pain.  UTI is a consideration but his UA is benign.  He does have history of diverticulosis, consider diverticulitis.  He would like to go ahead and do a CT abdomen pelvis which is scheduled for 3 PM today Signed Lamar Blinks, MD   Received CT report and the rest of his labs, called pt He does have diverticulitis, this is his first episode.  Prescribed Augmentin, counseled to eat a soft low residue diet or fluids only if not hungry for the next 2 to 3 days.  He will let me know if not improving promptly  Discussed A1c, offered to change medication.  For the time being he elects to continue current regimen  White blood cell count elevated consistent with known history of leukemia  Discussed finding of kidney cyst as described in CT report.  Per patient, he thinks this is been  present for many years.  I offered to have him see urology for their opinion, he declines at this time  Results for orders placed or performed in visit on 12/19/21  Hemoglobin A1c  Result Value Ref Range   Hgb A1c MFr Bld 7.8 (H) 4.6 - 6.5 %  Basic metabolic panel  Result Value Ref Range   Sodium 137 135 - 145 mEq/L   Potassium 3.7 3.5 - 5.1 mEq/L   Chloride 97 96 - 112 mEq/L   CO2 28 19 - 32  mEq/L   Glucose, Bld 164 (H) 70 - 99 mg/dL   BUN 16 6 - 23 mg/dL   Creatinine, Ser 1.18 0.40 - 1.50 mg/dL   GFR 61.19 >60.00 mL/min   Calcium 9.5 8.4 - 10.5 mg/dL  CBC  Result Value Ref Range   WBC 15.1 (H) 4.0 - 10.5 K/uL   RBC 4.82 4.22 - 5.81 Mil/uL   Platelets 213.0 150.0 - 400.0 K/uL   Hemoglobin 14.7 13.0 - 17.0 g/dL   HCT 44.1 39.0 - 52.0 %   MCV 91.5 78.0 - 100.0 fl   MCHC 33.3 30.0 - 36.0 g/dL   RDW 13.4 11.5 - 15.5 %  POCT URINALYSIS DIP (CLINITEK)  Result Value Ref Range   Color, UA yellow yellow   Clarity, UA clear clear   Glucose, UA negative negative mg/dL   Bilirubin, UA negative negative   Ketones, POC UA negative negative mg/dL   Spec Grav, UA 1.015 1.010 - 1.025   Blood, UA negative negative   pH, UA 5.0 5.0 - 8.0   POC PROTEIN,UA negative negative, trace   Urobilinogen, UA 0.2 0.2 or 1.0 E.U./dL   Nitrite, UA Negative Negative   Leukocytes, UA Negative Negative    CT Abdomen Pelvis W Contrast  Result Date: 12/19/2021 CLINICAL DATA:  Pain left lower quadrant of abdomen x1 week EXAM: CT ABDOMEN AND PELVIS WITH CONTRAST TECHNIQUE: Multidetector CT imaging of the abdomen and pelvis was performed using the standard protocol following bolus administration of intravenous contrast. RADIATION DOSE REDUCTION: This exam was performed according to the departmental dose-optimization program which includes automated exposure control, adjustment of the mA and/or kV according to patient size and/or use of iterative reconstruction technique. CONTRAST:  173m OMNIPAQUE IOHEXOL 300 MG/ML  SOLN COMPARISON:  03/06/2021 FINDINGS: Lower chest: Coronary artery calcifications are seen. Metallic sutures seen in the sternum. Hepatobiliary: There is fatty infiltration in the liver. There is no dilation of bile ducts. Surgical clips are seen in gallbladder fossa. Pancreas: No focal abnormality is seen. Spleen: Spleen measures 13.5 cm in maximum diameter. Adrenals/Urinary Tract: Adrenals are  unremarkable. There are multiple cysts of varying sizes in both kidneys largest measuring 9.8 cm in the upper pole of left kidney. There is prominence of upper infundibulum in the right kidney. There is a large cystic structure measuring 6.8 cm in diameter in the medial midportion of right kidney which may suggest dilated right renal pelvis or parapelvic cyst. This finding has not changed significantly. There are no renal or ureteral stones. Ureters not dilated. Urinary bladder is not distended. There is mild wall thickening in the left margin of urinary bladder. This may be due to incomplete distention or related to acute inflammation in the adjacent sigmoid colon. Stomach/Bowel: Small hiatal hernia is seen. Small bowel loops are not dilated. Appendix is not distinctly seen. There is no pericecal inflammation. Multiple diverticula are seen in the colon. There is wall thickening and pericolic stranding in the  sigmoid colon. There is no loculated pericolic abscess. Vascular/Lymphatic: Scattered atherosclerotic plaques and calcifications are seen in the aorta and its major branches. Reproductive: Prostate is enlarged projecting into the base of the bladder. Other: There is no ascites or pneumoperitoneum. There is previous ventral hernia repair. Small bilateral inguinal hernias containing fat are seen. Musculoskeletal: Defects are seen in the pars interarticularis of L5 vertebra. There is first-degree spondylolisthesis at L5-S1 level. IMPRESSION: Diverticulosis of colon. There is wall thickening and pericolic stranding in the sigmoid colon in the left side of pelvis suggesting acute sigmoid diverticulitis. There is no loculated pericolic abscess. There is no evidence of intestinal obstruction or pneumoperitoneum. There are multiple parapelvic and cortical cysts in both kidneys with no significant interval change. Cyst in the medial aspect of midportion of right kidney may suggest parapelvic cyst or dilated renal pelvis  due to ureteropelvic junction obstruction. This finding has not changed significantly. There are no renal or ureteral stones. There is small fixed hiatal hernia. Coronary artery disease. Fatty liver. Enlarged prostate. Other findings as described in the body of the report. Electronically Signed   By: Elmer Picker M.D.   On: 12/19/2021 15:37     Received negative urine culture 7/1-

## 2021-12-15 NOTE — Patient Instructions (Addendum)
It was good to see you again today, I will be in touch with your labs asap We will also get you set up for a CT scan of your abdomen/ pelvis to look for cause of your current pain  If anything is changing or getting worse please alert me!   I would recommend getting COVID-19 vaccination series, and also shingles vaccine at your pharmacy if not done already

## 2021-12-19 ENCOUNTER — Ambulatory Visit (HOSPITAL_BASED_OUTPATIENT_CLINIC_OR_DEPARTMENT_OTHER)
Admission: RE | Admit: 2021-12-19 | Discharge: 2021-12-19 | Disposition: A | Discharge status: Home / Self Care | Payer: Medicare HMO | Source: Ambulatory Visit | Attending: Family Medicine | Admitting: Family Medicine

## 2021-12-19 ENCOUNTER — Ambulatory Visit (INDEPENDENT_AMBULATORY_CARE_PROVIDER_SITE_OTHER): Payer: Medicare HMO | Admitting: Family Medicine

## 2021-12-19 ENCOUNTER — Telehealth: Payer: Self-pay

## 2021-12-19 ENCOUNTER — Encounter (HOSPITAL_BASED_OUTPATIENT_CLINIC_OR_DEPARTMENT_OTHER): Payer: Self-pay

## 2021-12-19 VITALS — BP 124/60 | HR 80 | Temp 98.3°F | Resp 18 | Ht 72.0 in | Wt 220.8 lb

## 2021-12-19 DIAGNOSIS — K5792 Diverticulitis of intestine, part unspecified, without perforation or abscess without bleeding: Secondary | ICD-10-CM

## 2021-12-19 DIAGNOSIS — K5730 Diverticulosis of large intestine without perforation or abscess without bleeding: Secondary | ICD-10-CM | POA: Diagnosis not present

## 2021-12-19 DIAGNOSIS — R1030 Lower abdominal pain, unspecified: Secondary | ICD-10-CM | POA: Diagnosis not present

## 2021-12-19 DIAGNOSIS — I10 Essential (primary) hypertension: Secondary | ICD-10-CM | POA: Diagnosis not present

## 2021-12-19 DIAGNOSIS — E785 Hyperlipidemia, unspecified: Secondary | ICD-10-CM | POA: Diagnosis not present

## 2021-12-19 DIAGNOSIS — E119 Type 2 diabetes mellitus without complications: Secondary | ICD-10-CM | POA: Diagnosis not present

## 2021-12-19 DIAGNOSIS — N281 Cyst of kidney, acquired: Secondary | ICD-10-CM | POA: Diagnosis not present

## 2021-12-19 HISTORY — DX: Diverticulitis of intestine, part unspecified, without perforation or abscess without bleeding: K57.92

## 2021-12-19 LAB — POCT URINALYSIS DIP (CLINITEK)
Bilirubin, UA: NEGATIVE
Blood, UA: NEGATIVE
Glucose, UA: NEGATIVE mg/dL
Ketones, POC UA: NEGATIVE mg/dL
Leukocytes, UA: NEGATIVE
Nitrite, UA: NEGATIVE
POC PROTEIN,UA: NEGATIVE
Spec Grav, UA: 1.015 (ref 1.010–1.025)
Urobilinogen, UA: 0.2 E.U./dL
pH, UA: 5 (ref 5.0–8.0)

## 2021-12-19 LAB — HEMOGLOBIN A1C: Hgb A1c MFr Bld: 7.8 % — ABNORMAL HIGH (ref 4.6–6.5)

## 2021-12-19 LAB — BASIC METABOLIC PANEL
BUN: 16 mg/dL (ref 6–23)
CO2: 28 mEq/L (ref 19–32)
Calcium: 9.5 mg/dL (ref 8.4–10.5)
Chloride: 97 mEq/L (ref 96–112)
Creatinine, Ser: 1.18 mg/dL (ref 0.40–1.50)
GFR: 61.19 mL/min (ref >60.00)
Glucose, Bld: 164 mg/dL — ABNORMAL HIGH (ref 70–99)
Potassium: 3.7 mEq/L (ref 3.5–5.1)
Sodium: 137 mEq/L (ref 135–145)

## 2021-12-19 LAB — CBC
HCT: 44.1 % (ref 39.0–52.0)
Hemoglobin: 14.7 g/dL (ref 13.0–17.0)
MCHC: 33.3 g/dL (ref 30.0–36.0)
MCV: 91.5 fl (ref 78.0–100.0)
Platelets: 213 10*3/uL (ref 150.0–400.0)
RBC: 4.82 Mil/uL (ref 4.22–5.81)
RDW: 13.4 % (ref 11.5–15.5)
WBC: 15.1 10*3/uL — ABNORMAL HIGH (ref 4.0–10.5)

## 2021-12-19 MED ORDER — AMOXICILLIN-POT CLAVULANATE 875-125 MG PO TABS: 1.0000 | ORAL_TABLET | Freq: Two times a day (BID) | ORAL | 0 refills | Status: DC

## 2021-12-19 MED ORDER — IOHEXOL 300 MG/ML  SOLN
100.0000 mL | Freq: Once | INTRAMUSCULAR | Status: AC | PRN
  Administered 2021-12-19: 100 mL via INTRAVENOUS

## 2021-12-19 NOTE — Telephone Encounter (Signed)
Called and spoke with patient to schedule his colonoscopy. Pt has been scheduled for an in person PV on Thursday, 01/02/22 at 10 am. Pt has been scheduled for a colonoscopy in the [place] on Monday, 01/13/22 at 9 am. Pt is aware that he will need to arrive by 8 am with a care partner. Pt states that he can see his appts in MyChart and does not need a letter with appt information. Pt verbalized understanding and had no concerns at the end of the call.

## 2021-12-19 NOTE — Telephone Encounter (Signed)
-----   Message from Yetta Flock, MD sent at 12/19/2021 10:20 AM EDT ----- Herbert Seta this patient is due for a colonoscopy, can you help schedule him? Thanks  ----- Message ----- From: Darreld Mclean, MD Sent: 12/19/2021   9:14 AM EDT To: Yetta Flock, MD  Hi Remo Lipps- this patient is wondering if he is due for colonoscopy.  Can your staff check for him?  He thinks he may be overdue Thank you!  [place]

## 2021-12-19 NOTE — Addendum Note (Signed)
Addended by: Lamar Blinks C on: 12/19/2021 04:57 PM   Modules accepted: Orders

## 2021-12-20 LAB — URINE CULTURE
MICRO NUMBER:: 13588866
Result:: NO GROWTH
SPECIMEN QUALITY:: ADEQUATE

## 2021-12-24 ENCOUNTER — Other Ambulatory Visit: Payer: Self-pay | Admitting: Family Medicine

## 2022-01-01 ENCOUNTER — Other Ambulatory Visit: Payer: Self-pay | Admitting: *Deleted

## 2022-01-01 MED ORDER — GLIPIZIDE ER 5 MG PO TB24: 5.0000 mg | ORAL_TABLET | Freq: Every day | ORAL | 1 refills | Status: DC

## 2022-01-02 ENCOUNTER — Telehealth: Payer: Self-pay | Admitting: *Deleted

## 2022-01-02 ENCOUNTER — Ambulatory Visit (AMBULATORY_SURGERY_CENTER): Payer: Self-pay | Admitting: *Deleted

## 2022-01-02 VITALS — Ht 72.0 in | Wt 220.0 lb

## 2022-01-02 DIAGNOSIS — Z86010 Personal history of colonic polyps: Secondary | ICD-10-CM

## 2022-01-02 MED ORDER — NA SULFATE-K SULFATE-MG SULF 17.5-3.13-1.6 GM/177ML PO SOLN: 1.0000 | ORAL | 0 refills | Status: DC

## 2022-01-02 NOTE — Progress Notes (Signed)
Patient is here in-person for PV. Patient denies any allergies to eggs or soy. Patient denies any problems with anesthesia/sedation. Patient is not on any oxygen at home. Patient is not taking any diet/weight loss medications or blood thinners. Went over procedure prep instructions with the patient. Patient is aware of our care-partner policy. Patient notified to use Singlecare coupon given for prescription.   H/O Diverticulitis sent TE to Dr.Armbruster.

## 2022-01-02 NOTE — Telephone Encounter (Signed)
Dr.Armbruster,  This patient had his PV today for recall colon. His last colon 2018, h/o polyps. He told me he was dx with Diverticulitis per CT on 12/19/2021, and just finished Augmentin on 12/31/21. He denies any symptoms at this time, except c/o abd pain this am before BM today that went away after BM.  He also was asking if he should have EGD, h/o Barrett's. Please review. Okay to proceed with colonoscopy as scheduled on 01/13/2022? Recall EGD needed at this time? Please advise. Thank you, Haily Caley PV

## 2022-01-03 ENCOUNTER — Encounter: Payer: Self-pay | Admitting: Family Medicine

## 2022-01-03 NOTE — Telephone Encounter (Signed)
He really should wait at least 6 weeks following diverticulitis prior to having a colonoscopy. If he wants to discuss EGD and these issues, may be best to see him in the office to sort out what is needed, and then schedule at that time. I would cancel his colonoscopy for this month in light of his recent diverticulitis. Thanks

## 2022-01-06 NOTE — Telephone Encounter (Signed)
Spoke with the patient and gave him Dr.Armbruster's recommendations. Made OV to see MD on Aug. 31 at 9 am and colon cx at this time-pt is aware.

## 2022-01-07 ENCOUNTER — Other Ambulatory Visit: Payer: Self-pay | Admitting: Family Medicine

## 2022-01-13 ENCOUNTER — Encounter: Payer: Medicare HMO | Admitting: Gastroenterology

## 2022-01-16 ENCOUNTER — Other Ambulatory Visit: Payer: Self-pay | Admitting: Family Medicine

## 2022-01-16 DIAGNOSIS — Z8739 Personal history of other diseases of the musculoskeletal system and connective tissue: Secondary | ICD-10-CM

## 2022-01-16 DIAGNOSIS — E876 Hypokalemia: Secondary | ICD-10-CM

## 2022-01-19 ENCOUNTER — Other Ambulatory Visit: Payer: Self-pay | Admitting: Internal Medicine

## 2022-01-20 DIAGNOSIS — E119 Type 2 diabetes mellitus without complications: Secondary | ICD-10-CM | POA: Diagnosis not present

## 2022-01-20 DIAGNOSIS — H524 Presbyopia: Secondary | ICD-10-CM | POA: Diagnosis not present

## 2022-01-20 DIAGNOSIS — Z7984 Long term (current) use of oral hypoglycemic drugs: Secondary | ICD-10-CM | POA: Diagnosis not present

## 2022-01-20 DIAGNOSIS — D3130 Benign neoplasm of unspecified choroid: Secondary | ICD-10-CM | POA: Diagnosis not present

## 2022-01-20 DIAGNOSIS — H2513 Age-related nuclear cataract, bilateral: Secondary | ICD-10-CM | POA: Diagnosis not present

## 2022-01-20 DIAGNOSIS — H25013 Cortical age-related cataract, bilateral: Secondary | ICD-10-CM | POA: Diagnosis not present

## 2022-01-20 LAB — HM DIABETES EYE EXAM

## 2022-02-17 ENCOUNTER — Telehealth: Payer: Self-pay | Admitting: Family Medicine

## 2022-02-17 NOTE — Telephone Encounter (Signed)
Pt's spouse dropped off document for provider to see and have on pt's chart. ( Copy of [facility] -1 page) Document put at front office tray under providers name.

## 2022-02-20 ENCOUNTER — Ambulatory Visit: Payer: Medicare HMO | Admitting: Gastroenterology

## 2022-02-20 ENCOUNTER — Encounter: Payer: Self-pay | Admitting: Gastroenterology

## 2022-02-20 VITALS — BP 118/62 | HR 74 | Ht 71.0 in | Wt 223.0 lb

## 2022-02-20 DIAGNOSIS — Z79899 Other long term (current) drug therapy: Secondary | ICD-10-CM | POA: Diagnosis not present

## 2022-02-20 DIAGNOSIS — K5732 Diverticulitis of large intestine without perforation or abscess without bleeding: Secondary | ICD-10-CM

## 2022-02-20 DIAGNOSIS — Z86010 Personal history of colonic polyps: Secondary | ICD-10-CM | POA: Diagnosis not present

## 2022-02-20 DIAGNOSIS — R1312 Dysphagia, oropharyngeal phase: Secondary | ICD-10-CM | POA: Diagnosis not present

## 2022-02-20 DIAGNOSIS — K219 Gastro-esophageal reflux disease without esophagitis: Secondary | ICD-10-CM

## 2022-02-20 DIAGNOSIS — R194 Change in bowel habit: Secondary | ICD-10-CM

## 2022-02-20 NOTE — Progress Notes (Signed)
HPI :  76 year old male with a history of CAD status post CABG and stenting, history of gallstones status postcholecystectomy, history of colon polyps, GERD, here to reestablish care for some of these issues, referred by Lamar Blinks MD.  I have not seen him since 2018.  At the time of our last visit he was having chronic loose stools, he underwent a colonoscopy with me in January 2018, he had 3 adenomas removed.  Biopsies of his colon were negative for microscopic colitis.  I have not seen him since that time.  He states the loose stools have resolved and he has no chronic diarrhea.  He states his stool varies in form, he can go back and forth between constipated and loose stools.  Has used MiraLAX as needed in the past.  Denies any blood in his stools.  No family history of colon cancer.  He thinks his mother had a resection for ischemic colitis in the past?  He denies any abdominal pains that bother him now.  He did have diverticulitis diagnosed this past June, which was the first time he has had this in the past.  He has recovered okay from that episode and is feeling well.  He endorses a history of reflux longstanding.  He had previously been on omeprazole 20 mg daily for heartburn over years, in recent years had some chest symptoms that were bothering him and states he was told it was due to reflux after cardiac work-up.  He has been on twice daily omeprazole for some time now and states it works to control his reflux pretty well.  He does not have any dysphagia in his esophagus but sometimes feels like what he swallows can "go down the wrong pipe" and can lead to aspiration symptoms.  This is not frequently but does bother him enough or is noticeable.  He had a remote EGD in 2015 by Dr. Lyda Jester which showed short segment Barrett's esophagus.  He had a follow-up EGD in 2017 which showed an irregular Z-line which was biopsied and negative for Barrett's.  He has not had any surveillance since  that time.  He is interested in doing another EGD.  He is currently on baby aspirin for his heart.  He is no longer on any Plavix.  He exercises routinely and denies any cardiopulmonary symptoms that bothers him.  He had a nuclear stress test in March which showed an EF of 47% and stable changes compared to 2016.  He otherwise feels well without complaints.  Prior exams: Colonoscopy 07/21/2016: - The perianal and digital rectal examinations were normal. - A 5 mm polyp was found in the ascending colon. The polyp was sessile. The polyp was removed with a cold snare. Resection and retrieval were complete. - A 3 mm polyp was found in the hepatic flexure. The polyp was sessile. The polyp was removed with a cold biopsy forceps. Resection and retrieval were complete. - A 3 mm polyp was found in the splenic flexure. The polyp was sessile. The polyp was removed with a cold biopsy forceps. Resection and retrieval were complete. - The terminal ileum appeared normal. - Multiple medium-mouthed diverticula were found in the left colon. - The exam was otherwise without abnormality. - Biopsies for histology were taken with a cold forceps from the right colon and left colon for evaluation of microscopic colitis.  1. Surgical [P], ascending colon and hepatic flexure, splenic flexure, polyp (3) - TUBULAR ADENOMA (X3 FRAGMENTS). - NO HIGH GRADE DYSPLASIA  OR MALIGNANCY. 2. Surgical [P], random sites - BENIGN COLONIC MUCOSA WITH LYMPHOID AGGREGATES. - NO ACTIVE INFLAMMATION OR EVIDENCE OF MICROSCOPIC COLITIS. - NO DYSPLASIA OR MALIGNANCY.  EGD 08/31/15: Dr/ Meisenheimer Irregular z line - biopsied Hiatal hernia No path  Esophagus, biopsy, distal -BENIGN SQUAMOGLANDULAR MUCOSA WITH CHRONIC INFLAMMATION. -NEGATIVE FOR INTESTINAL METAPLASIA, DYSPLASIA OR MALIGNANCY.  EGD 08/01/13 Short segment BE - irregular  zline   CT abdomen / pelvis with contrast 12/19/21: IMPRESSION: Diverticulosis of colon. There  is wall thickening and pericolic stranding in the sigmoid colon in the left side of pelvis suggesting acute sigmoid diverticulitis. There is no loculated pericolic abscess.   There is no evidence of intestinal obstruction or pneumoperitoneum.   There are multiple parapelvic and cortical cysts in both kidneys with no significant interval change. Cyst in the medial aspect of midportion of right kidney may suggest parapelvic cyst or dilated renal pelvis due to ureteropelvic junction obstruction. This finding has not changed significantly. There are no renal or ureteral stones.   There is small fixed hiatal hernia. Coronary artery disease. Fatty liver. Enlarged prostate.   Other findings as described in the body of the report.    Nuclear stress test 09/04/21: Evidence of prior MI EF 47% No change from prior study in 2016   Past Medical History:  Diagnosis Date   Anginal pain ([place])    Arthritis    Barrett's esophagus    BPH (benign prostatic hypertrophy)    CAD (coronary artery disease)     a. CABG 1991;  b.  [place];  c.  S/P PTCA  stents to SVG ot OM last in 2003;  d.  Redo CABG x 2 in 2004 (VG->OM->LCX, LRA->PDA);  e. 12/2011 Cath: 3vd, sev LM into LCX dzs, VG->OM 100, LIMA->DIAG->LAD patent, Rad Art->PDA patent, EF 45%, Med Rx.   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[place])    Myocardial infarction ([place])    at age 24   Psoriasis    Rheumatic fever    as a child   S/P coronary artery stent placement 1998   TIA (transient ischemic attack)    FOUND ON MRI  PER PATIENT-     NO PROBLEM     Past Surgical History:  Procedure Laterality Date   APPENDECTOMY     CHOLECYSTECTOMY N/A 09/18/2016   Procedure: LAPAROSCOPIC CHOLECYSTECTOMY WITH  INTRAOPERATIVE CHOLANGIOGRAM;  Surgeon: Jackolyn Confer, MD;  Location: [facility];  Service: General;  Laterality: N/A;   COLONOSCOPY  07/2013   COLONOSCOPY WITH PROPOFOL  07/21/2016   Dr.Amarrah Meinhart   CORONARY ANGIOPLASTY     STENTS   CORONARY ARTERY BYPASS GRAFT     1991 (LIMA to LAD/Diag;  SVG to RCA;  SVG to OM)   CORONARY ARTERY BYPASS GRAFT     2004 (L radial to PDA; SVG to OM1/ distal LCx)   POLYPECTOMY     TONSILLECTOMY AND ADENOIDECTOMY     UMBILICAL HERNIA REPAIR     Family History  Problem Relation Age of Onset   Heart disease Mother    Hypertension Mother    Hyperlipidemia Mother    Diabetes Mother    Skin cancer Mother  skin   Heart disease Father    Hypertension Father    Hyperlipidemia Father    Diabetes Father    Stroke Sister    Stroke Brother    Colon cancer Neg Hx    Esophageal cancer Neg Hx    Stomach cancer Neg Hx    Rectal cancer Neg Hx    Social History   Tobacco Use   Smoking status: Former    Packs/day: 0.50    Years: 8.00    Total pack years: 4.00    Types: Cigarettes    Quit date: 06/24/1971    Years since quitting: 50.6   Smokeless tobacco: Never  Vaping Use   Vaping Use: Never used  Substance Use Topics   Alcohol use: No   Drug use: No   Current Outpatient Medications  Medication Sig Dispense Refill   acetaminophen (TYLENOL) 325 MG tablet Take 325 mg by mouth every 6 (six) hours as needed for mild pain.     allopurinol (ZYLOPRIM) 300 MG tablet TAKE 1 TABLET EVERY DAY 90 tablet 1   amLODipine (NORVASC) 10 MG tablet Take 1 tablet (10 mg total) by mouth daily. 90 tablet 3   aspirin EC 81 MG tablet Take 81 mg by mouth daily.     atorvastatin (LIPITOR) 40 MG tablet TAKE 1 TABLET EVERY DAY 90 tablet 2   glipiZIDE (GLUCOTROL XL) 10 MG 24 hr tablet Take 1 tablet (10 mg total) by mouth daily with breakfast. 90 tablet 3   glipiZIDE (GLUCOTROL XL) 5 MG 24 hr tablet TAKE 1 TABLET (5 MG TOTAL) BY MOUTH DAILY WITH BREAKFAST. TAKE WITH 10 MG  TABLET FOR 15 MG TOTAL 90 tablet 1   glucose blood (TRUE METRIX BLOOD GLUCOSE TEST) test strip TEST AS DIRECTED DAILY 100 strip 1   icosapent Ethyl (VASCEPA) 1 g capsule Take 2 capsules (2 g total) by mouth 2 (two) times daily. 360 capsule 3   isosorbide mononitrate (IMDUR) 60 MG 24 hr tablet TAKE 1 TABLET EVERY DAY 90 tablet 3   loratadine (CLARITIN) 10 MG tablet Take 10 mg by mouth daily as needed for allergies.     metFORMIN (GLUCOPHAGE) 500 MG tablet TAKE 1 TABLET TWICE DAILY WITH MEALS 180 tablet 1   metoprolol succinate (TOPROL-XL) 25 MG 24 hr tablet TAKE 1 TABLET IN THE MORNING AND AT BEDTIME 180 tablet 2   MULTIPLE VITAMIN-FOLIC ACID PO Take 1 tablet by mouth daily. Contains 224 mg folic acid     Na Sulfate-K Sulfate-Mg Sulf 17.5-3.13-1.6 GM/177ML SOLN Take 1 kit by mouth as directed. May use generic SUPREP;NO prior authorizations will be done.Please use Singlecare or GOOD-RX coupon. 354 mL 0   nitroGLYCERIN (NITROSTAT) 0.4 MG SL tablet Place 1 tablet (0.4 mg total) under the tongue every 5 (five) minutes as needed for chest pain. 25 tablet 3   omeprazole (PRILOSEC) 20 MG capsule TAKE 1 CAPSULE TWICE DAILY 180 capsule 3   potassium chloride SA (KLOR-CON M) 20 MEQ tablet TAKE 1 TABLET EVERY DAY 90 tablet 1   REPATHA SURECLICK 825 MG/ML SOAJ INJECT 1 PEN INTO THE SKIN EVERY 14 (FOURTEEN) DAYS. 6 mL 3   triamcinolone cream (KENALOG) 0.1 % Apply 1 application topically 2 (two) times daily. Use as needed for dry or itchy skin (Patient taking differently: Apply 1 application  topically daily. Use as needed for dry or itchy skin) 30 g 2   TRUEplus Lancets 33G MISC USE AS DIRECTED ONCE A DAY. 100 each 1  valsartan-hydrochlorothiazide (DIOVAN-HCT) 160-25 MG tablet TAKE 1 TABLET EVERY DAY 90 tablet 3   albuterol (VENTOLIN HFA) 108 (90 Base) MCG/ACT inhaler Inhale 2 puffs into the lungs every 6 (six) hours as needed. (Patient not taking: Reported on 01/02/2022) 18 g 0   No current  facility-administered medications for this visit.   Allergies  Allergen Reactions   Other Hives    Intolerance to strong pain medications  Rash to opsite and tegaderm tape.   Rosuvastatin     Nausea, abdominal discomfort   Ticlopidine Hcl Hives and Itching   Black Pepper [[place]] Palpitations    HEART PALPITATIONS HEART PALPITATIONS   Codeine Palpitations and Other (See Comments)    Wild dreams, palpitations "wild dreams"     Review of Systems: All systems reviewed and negative except where noted in HPI.   Lab Results  Component Value Date   WBC 15.1 (H) 12/19/2021   HGB 14.7 12/19/2021   HCT 44.1 12/19/2021   MCV 91.5 12/19/2021   PLT 213.0 12/19/2021    Lab Results  Component Value Date   CREATININE 1.18 12/19/2021   BUN 16 12/19/2021   NA 137 12/19/2021   K 3.7 12/19/2021   CL 97 12/19/2021   CO2 28 12/19/2021    Lab Results  Component Value Date   ALT 20 12/02/2021   AST 18 12/02/2021   ALKPHOS 60 12/02/2021   BILITOT 0.8 12/02/2021     Physical Exam: BP 118/62   Pulse 74   Ht '5\' 11"'  (1.803 m)   Wt 223 lb (101.2 kg)   BMI 31.10 kg/m  Constitutional: Pleasant,well-developed, male in no acute distress. HEENT: Normocephalic and atraumatic. Conjunctivae are normal. No scleral icterus. Neck supple.  Cardiovascular: Normal rate, regular rhythm.  Pulmonary/chest: Effort normal and breath sounds normal.  Abdominal: Soft, nondistended, protuberant, nontender. There are no masses palpable. No hepatomegaly. Extremities: no edema Lymphadenopathy: No cervical adenopathy noted. Neurological: Alert and oriented to person place and time. Skin: Skin is warm and dry. No rashes noted. Psychiatric: Normal mood and affect. Behavior is normal.   ASSESSMENT: 76 y.o. male here for assessment of the following  1. Diverticulitis of colon   2. History of colon polyps   3. Altered bowel habits   4. Gastroesophageal reflux disease, unspecified whether esophagitis  present   5. Long-term current use of proton pump inhibitor therapy   6. Oropharyngeal dysphagia    As above, first-time episode of diverticulitis in June, he has a history of known diverticulosis.  He previously had chronic diarrhea however that has since resolved, now has more alternating constipation and loose stools.  He had 3 adenomas removed on his last colonoscopy over 5 years ago, he is overdue for surveillance colonoscopy.  In light of all of these issues I offered him a colonoscopy.  Following discussion of risks and benefits and what this entailed, he wants to proceed.  In the interim recommend he use Citrucel once daily to provide some regularity to his bowel habits, he was agreeable to do this.  We discussed his reflux history for a bit.  He has been on chronic PPI for years.  I discussed long-term risks of this.  He has stable renal function, no history of osteoporosis, which is good.  We discussed new studies questioning link to dementia although no causation between PPI dementia based on studies to date.  He is aware of this and will use the lowest dose of omeprazole needed to control symptoms.  I  reviewed his endoscopic history with him, he had 1 endoscopy that showed Barrett's esophagus and a follow-up that showed irregularity at the Z-line but biopsies were negative.  Given his dysphagia as outlined above, while that may be oropharyngeal, I offered him an EGD to ensure his esophagus is okay and to reassess his Barrett's, at the same time as his colonoscopy.  Following discussion of this he wishes to proceed.  His cardiopulmonary status is stable, recent nuclear stress as outlined above.  If his EGD is negative we may consider modified barium swallow if his symptoms persist.  He agreed   PLAN: - schedule EGD and colonoscopy in the [place] - continue dosing of omeprazole for now, discussed long term risks  - start Citrucel once daily - consider modified barium swallow if EGD negative and  oropharyngeal symptoms persist  [name], MD [facility]  CC: Copland, Gay Filler, MD

## 2022-02-20 NOTE — Patient Instructions (Signed)
Start over the counter Citracel daily.   You have been scheduled for an endoscopy and colonoscopy. Please follow the written instructions given to you at your visit today. Please pick up your prep supplies at the pharmacy within the next 1-3 days. If you use inhalers (even only as needed), please bring them with you on the day of your procedure.  The [facility] providers would like to encourage you to use [facility] to communicate with providers for non-urgent requests or questions.  Due to long hold times on the telephone, sending your provider a message by [facility] may be a faster and more efficient way to get a response.  Please allow 48 business hours for a response.  Please remember that this is for non-urgent requests.   Due to recent changes in healthcare laws, you may see the results of your imaging and laboratory studies on MyChart before your provider has had a chance to review them.  We understand that in some cases there may be results that are confusing or concerning to you. Not all laboratory results come back in the same time frame and the provider may be waiting for multiple results in order to interpret others.  Please give [place] 48 hours in order for your provider to thoroughly review all the results before contacting the office for clarification of your results.

## 2022-02-27 ENCOUNTER — Encounter: Payer: Self-pay | Admitting: Gastroenterology

## 2022-02-27 ENCOUNTER — Ambulatory Visit (AMBULATORY_SURGERY_CENTER): Payer: Medicare HMO | Admitting: Gastroenterology

## 2022-02-27 VITALS — BP 108/68 | HR 71 | Temp 98.7°F | Resp 12 | Ht 71.0 in | Wt 223.0 lb

## 2022-02-27 DIAGNOSIS — R1312 Dysphagia, oropharyngeal phase: Secondary | ICD-10-CM

## 2022-02-27 DIAGNOSIS — Z09 Encounter for follow-up examination after completed treatment for conditions other than malignant neoplasm: Secondary | ICD-10-CM

## 2022-02-27 DIAGNOSIS — Z86010 Personal history of colonic polyps: Secondary | ICD-10-CM | POA: Diagnosis not present

## 2022-02-27 DIAGNOSIS — D123 Benign neoplasm of transverse colon: Secondary | ICD-10-CM

## 2022-02-27 DIAGNOSIS — K317 Polyp of stomach and duodenum: Secondary | ICD-10-CM

## 2022-02-27 DIAGNOSIS — D125 Benign neoplasm of sigmoid colon: Secondary | ICD-10-CM

## 2022-02-27 DIAGNOSIS — K2270 Barrett's esophagus without dysplasia: Secondary | ICD-10-CM

## 2022-02-27 DIAGNOSIS — K219 Gastro-esophageal reflux disease without esophagitis: Secondary | ICD-10-CM | POA: Diagnosis not present

## 2022-02-27 DIAGNOSIS — K5730 Diverticulosis of large intestine without perforation or abscess without bleeding: Secondary | ICD-10-CM | POA: Diagnosis not present

## 2022-02-27 DIAGNOSIS — D120 Benign neoplasm of cecum: Secondary | ICD-10-CM | POA: Diagnosis not present

## 2022-02-27 DIAGNOSIS — K5732 Diverticulitis of large intestine without perforation or abscess without bleeding: Secondary | ICD-10-CM

## 2022-02-27 DIAGNOSIS — K229 Disease of esophagus, unspecified: Secondary | ICD-10-CM | POA: Diagnosis not present

## 2022-02-27 MED ORDER — SODIUM CHLORIDE 0.9 % IV SOLN: 500.0000 mL | Freq: Once | INTRAVENOUS | Status: DC

## 2022-02-27 NOTE — Progress Notes (Signed)
To pacu, VSS. Report to rn.tb °

## 2022-02-27 NOTE — Progress Notes (Signed)
History and Physical Interval Note: Patient seen on 8/31 - no interval changes. EGD and colonoscopy to evaluate issues as outlined below. Patient feels well without complaints today.   02/27/2022 11:10 AM  Brandon Stone  has presented today for endoscopic procedure(s), with the diagnosis of  Encounter Diagnoses  Name Primary?   Diverticulitis of colon Yes   History of colon polyps    Gastroesophageal reflux disease, unspecified whether esophagitis present    Oropharyngeal dysphagia    Barrett's esophagus without dysplasia   .  The various methods of evaluation and treatment have been discussed with the patient and/or family. After consideration of risks, benefits and other options for treatment, the patient has consented to  the endoscopic procedure(s).   The patient's history has been reviewed, patient examined, no change in status, stable for surgery.  I have reviewed the patient's chart and labs.  Questions were answered to the patient's satisfaction.    [name], MD [facility]

## 2022-02-27 NOTE — Patient Instructions (Signed)
Handout on polyps and diverticulosis given.     YOU HAD AN ENDOSCOPIC PROCEDURE TODAY AT [facility]:   Refer to the procedure report that was given to you for any specific questions about what was found during the examination.  If the procedure report does not answer your questions, please call your gastroenterologist to clarify.  If you requested that your care partner not be given the details of your procedure findings, then the procedure report has been included in a sealed envelope for you to review at your convenience later.  YOU SHOULD EXPECT: Some feelings of bloating in the abdomen. Passage of more gas than usual.  Walking can help get rid of the air that was put into your GI tract during the procedure and reduce the bloating. If you had a lower endoscopy (such as a colonoscopy or flexible sigmoidoscopy) you may notice spotting of blood in your stool or on the toilet paper. If you underwent a bowel prep for your procedure, you may not have a normal bowel movement for a few days.  Please Note:  You might notice some irritation and congestion in your nose or some drainage.  This is from the oxygen used during your procedure.  There is no need for concern and it should clear up in a day or so.  SYMPTOMS TO REPORT IMMEDIATELY:  Following lower endoscopy (colonoscopy or flexible sigmoidoscopy):  Excessive amounts of blood in the stool  Significant tenderness or worsening of abdominal pains  Swelling of the abdomen that is new, acute  Fever of 100F or higher  Following upper endoscopy (EGD)  Vomiting of blood or coffee ground material  New chest pain or pain under the shoulder blades  Painful or persistently difficult swallowing  New shortness of breath  Fever of 100F or higher  Black, tarry-looking stools  For urgent or emergent issues, a gastroenterologist can be reached at any hour by calling [phone]. Do not use MyChart messaging for urgent concerns.     DIET:  We do recommend a small meal at first, but then you may proceed to your regular diet.  Drink plenty of fluids but you should avoid alcoholic beverages for 24 hours.  ACTIVITY:  You should plan to take it easy for the rest of today and you should NOT DRIVE or use heavy machinery until tomorrow (because of the sedation medicines used during the test).    FOLLOW UP: Our staff will call the number listed on your records the next business day following your procedure.  We will call around 7:15- 8:00 am to check on you and address any questions or concerns that you may have regarding the information given to you following your procedure. If we do not reach you, we will leave a message.  If you develop any symptoms (ie: fever, flu-like symptoms, shortness of breath, cough etc.) before then, please call [phone].  If you test positive for Covid 19 in the 2 weeks post procedure, please call and report this information to [place].    If any biopsies were taken you will be contacted by phone or by letter within the next 1-3 weeks.  Please call us at [phone] if you have not heard about the biopsies in 3 weeks.    SIGNATURES/CONFIDENTIALITY: You and/or your care partner have signed paperwork which will be entered into your electronic medical record.  These signatures attest to the fact that that the information above on your After Visit Summary has been  reviewed and is understood.  Full responsibility of the confidentiality of this discharge information lies with you and/or your care-partner.

## 2022-02-27 NOTE — Progress Notes (Signed)
Pt's states no medical or surgical changes since previsit or office visit. 

## 2022-02-27 NOTE — Progress Notes (Signed)
Called to room to assist during endoscopic procedure.  Patient ID and intended procedure confirmed with present staff. Received instructions for my participation in the procedure from the performing physician.  

## 2022-02-27 NOTE — Op Note (Signed)
[place] Patient Name: Brandon Stone Procedure Date: 02/27/2022 11:12 AM MRN: [id number] Endoscopist: Remo Lipps P. Havery [name] , MD Age: 76 Referring MD:  Date of Birth: 07/27/1945 Gender: Male Account #: [id number] Procedure:                Upper GI endoscopy Indications:              Surveillance for malignancy due to personal history                            of reported Barrett's esophagus, Follow-up of                            gastro-esophageal reflux disease - on omeprazole                            '20mg'$  twice daily Medicines:                Monitored Anesthesia Care Procedure:                Pre-Anesthesia Assessment:                           - Prior to the procedure, a History and Physical                            was performed, and patient medications and                            allergies were reviewed. The patient's tolerance of                            previous anesthesia was also reviewed. The risks                            and benefits of the procedure and the sedation                            options and risks were discussed with the patient.                            All questions were answered, and informed consent                            was obtained. Prior Anticoagulants: The patient has                            taken no previous anticoagulant or antiplatelet                            agents. ASA Grade Assessment: III - A patient with                            severe systemic disease. After reviewing the risks  and benefits, the patient was deemed in                            satisfactory condition to undergo the procedure.                           After obtaining informed consent, the endoscope was                            passed under direct vision. Throughout the                            procedure, the patient's blood pressure, pulse, and                            oxygen saturations were monitored  continuously. The                            GIF [id number] was introduced through the                            mouth, and advanced to the second part of duodenum.                            The upper GI endoscopy was accomplished without                            difficulty. The patient tolerated the procedure                            well. Scope In: Scope Out: Findings:                 Esophagogastric landmarks were identified: the                            Z-line was found at 39 cm, the gastroesophageal                            junction was found at 40 cm and the upper extent of                            the gastric folds was found at 44 cm from the                            incisors.                           A 4 cm hiatal hernia was present.                           There were esophageal mucosal changes classified as                            Barrett's stage C0-M1 per  Pr[place] criteria present                            in the lower third of the esophagus (2 tongues).                            The maximum longitudinal extent of these mucosal                            changes was 1 cm in length. Biopsies were taken                            with a cold forceps for histology.                           The exam of the esophagus was otherwise normal.                           Multiple small sessile polyps were found in the                            gastric fundus and in the gastric body. Two                            representative polyps were removed with a cold                            biopsy forceps. Resection and retrieval were                            complete.                           The exam of the stomach was otherwise normal.                           The examined duodenum was normal. Complications:            No immediate complications. Estimated blood loss:                            Minimal. Estimated Blood Loss:     Estimated blood loss was  minimal. Impression:               - Esophagogastric landmarks identified.                           - 4 cm hiatal hernia.                           - Esophageal mucosal changes classified as                            Barrett's stage C0-M1 per Prague criteria. Biopsied.                           -  Multiple gastric polyps. Suspect benign fundic                            gland polyps. Representative polyps resected and                            retrieved.                           - Normal stomach otherwise                           - Normal examined duodenum. Recommendation:           - Patient has a contact number available for                            emergencies. The signs and symptoms of potential                            delayed complications were discussed with the                            patient. Return to normal activities tomorrow.                            Written discharge instructions were provided to the                            patient.                           - Resume previous diet.                           - Continue present medications.                           - Await pathology results. Remo Lipps P. [name], MD 02/27/2022 11:54:56 AM This report has been signed electronically.

## 2022-02-27 NOTE — Op Note (Signed)
[place] [facility] Patient Name: Brandon Stone Procedure Date: 02/27/2022 11:00 AM MRN: [id number] Endoscopist: Remo Lipps P. Havery [name] , MD Age: 76 Referring MD:  Date of Birth: 10/05/1945 Gender: Male Account #: [id number] Procedure:                Colonoscopy Indications:              High risk colon cancer surveillance: Personal                            history of colonic polyps - 3 adenomas removed                            2018, also with recent diagnosis of diverticulitis                            this past June - first episode Medicines:                Monitored Anesthesia Care Procedure:                Pre-Anesthesia Assessment:                           - Prior to the procedure, a History and Physical                            was performed, and patient medications and                            allergies were reviewed. The patient's tolerance of                            previous anesthesia was also reviewed. The risks                            and benefits of the procedure and the sedation                            options and risks were discussed with the patient.                            All questions were answered, and informed consent                            was obtained. Prior Anticoagulants: The patient has                            taken no previous anticoagulant or antiplatelet                            agents. ASA Grade Assessment: III - A patient with                            severe systemic disease. After reviewing the risks  and benefits, the patient was deemed in                            satisfactory condition to undergo the procedure.                           After obtaining informed consent, the colonoscope                            was passed under direct vision. Throughout the                            procedure, the patient's blood pressure, pulse, and                            oxygen saturations were  monitored continuously. The                            Colonoscope was introduced through the anus and                            advanced to the the cecum, identified by                            appendiceal orifice and ileocecal valve. The                            colonoscopy was performed without difficulty. The                            patient tolerated the procedure well. The quality                            of the bowel preparation was adequate. The                            ileocecal valve, appendiceal orifice, and rectum                            were photographed. Scope In: 11:28:38 AM Scope Out: 11:45:23 AM Scope Withdrawal Time: 0 hours 13 minutes 53 seconds  Total Procedure Duration: 0 hours 16 minutes 45 seconds  Findings:                 The perianal and digital rectal examinations were                            normal.                           A 3 mm polyp was found in the cecum. The polyp was                            sessile. The polyp was removed with a cold snare.  Resection and retrieval were complete.                           Two sessile polyps were found in the transverse                            colon. The polyps were 3 mm in size. These polyps                            were removed with a cold snare. Resection and                            retrieval were complete.                           A 3 mm polyp was found in the sigmoid colon. The                            polyp was sessile. The polyp was removed with a                            cold snare. Resection and retrieval were complete.                           Multiple small-mouthed diverticula were found in                            the left colon.                           Internal hemorrhoids were found during                            retroflexion. The hemorrhoids were small.                           The exam was otherwise without abnormality. Complications:             No immediate complications. Estimated blood loss:                            Minimal. Estimated Blood Loss:     Estimated blood loss was minimal. Impression:               - One 3 mm polyp in the cecum, removed with a cold                            snare. Resected and retrieved.                           - Two 3 mm polyps in the transverse colon, removed                            with a cold snare. Resected and retrieved.                           -  One 3 mm polyp in the sigmoid colon, removed with                            a cold snare. Resected and retrieved.                           - Diverticulosis in the left colon which correlates                            to prior diagnosis of diverticulitis                           - Internal hemorrhoids.                           - The examination was otherwise normal. Recommendation:           - Patient has a contact number available for                            emergencies. The signs and symptoms of potential                            delayed complications were discussed with the                            patient. Return to normal activities tomorrow.                            Written discharge instructions were provided to the                            patient.                           - Resume previous diet.                           - Continue present medications.                           - Await pathology results. Remo Lipps P. Jaquin Coy, MD 02/27/2022 11:50:32 AM This report has been signed electronically.

## 2022-02-28 ENCOUNTER — Telehealth: Payer: Self-pay | Admitting: *Deleted

## 2022-02-28 NOTE — Telephone Encounter (Signed)
  Follow up Call-     02/27/2022   10:13 AM  Call back number  Post procedure Call Back phone  # [phone]  Permission to leave phone message Yes     Patient questions:  Do you have a fever, pain , or abdominal swelling? No. Pain Score  0 *  Have you tolerated food without any problems? Yes.    Have you been able to return to your normal activities? Yes.    Do you have any questions about your discharge instructions: Diet   No. Medications  No. Follow up visit  No.  Do you have questions or concerns about your Care? No.  Actions: * If pain score is 4 or above: No action needed, pain <4.

## 2022-03-14 DIAGNOSIS — J019 Acute sinusitis, unspecified: Secondary | ICD-10-CM | POA: Diagnosis not present

## 2022-03-14 DIAGNOSIS — J209 Acute bronchitis, unspecified: Secondary | ICD-10-CM | POA: Diagnosis not present

## 2022-03-19 ENCOUNTER — Other Ambulatory Visit: Payer: Self-pay | Admitting: Internal Medicine

## 2022-03-25 ENCOUNTER — Encounter: Payer: Self-pay | Admitting: Family

## 2022-03-25 ENCOUNTER — Ambulatory Visit (INDEPENDENT_AMBULATORY_CARE_PROVIDER_SITE_OTHER): Payer: Medicare HMO | Admitting: Family

## 2022-03-25 VITALS — BP 130/60 | HR 62 | Temp 97.8°F | Ht 72.0 in | Wt 222.0 lb

## 2022-03-25 DIAGNOSIS — E119 Type 2 diabetes mellitus without complications: Secondary | ICD-10-CM | POA: Diagnosis not present

## 2022-03-25 DIAGNOSIS — J209 Acute bronchitis, unspecified: Secondary | ICD-10-CM

## 2022-03-25 DIAGNOSIS — M25512 Pain in left shoulder: Secondary | ICD-10-CM

## 2022-03-25 MED ORDER — PREDNISONE 20 MG PO TABS: 20.0000 mg | ORAL_TABLET | Freq: Every day | ORAL | 0 refills | Status: DC

## 2022-03-25 MED ORDER — DOXYCYCLINE HYCLATE 100 MG PO TABS: 100.0000 mg | ORAL_TABLET | Freq: Two times a day (BID) | ORAL | 0 refills | Status: DC

## 2022-03-25 NOTE — Progress Notes (Signed)
Brandon Stone is a 76 y.o. male with the following history as recorded in EpicCare:  Patient Active Problem List   Diagnosis Date Noted   AAA (abdominal aortic aneurysm) ([place]) 10/31/2019   Subdural hematoma ([place]) 09/08/2018   Diabetes mellitus without complication ([place]) 71/95/9747   Symptomatic cholelithiasis 09/18/2016   Chronic lymphocytic leukemia (CLL), B-cell ([place]) 05/13/2014   Stable angina 01/26/2012   Sore throat 11/03/2010   Hyperlipidemia 06/21/2008   HYPERTENSION, BENIGN 06/21/2008   CAD, NATIVE VESSEL 06/21/2008    Current Outpatient Medications  Medication Sig Dispense Refill   acetaminophen (TYLENOL) 325 MG tablet Take 325 mg by mouth every 6 (six) hours as needed for mild pain.     allopurinol (ZYLOPRIM) 300 MG tablet TAKE 1 TABLET EVERY DAY 90 tablet 1   amLODipine (NORVASC) 10 MG tablet Take 1 tablet (10 mg total) by mouth daily. 90 tablet 3   aspirin EC 81 MG tablet Take 81 mg by mouth daily.     atorvastatin (LIPITOR) 40 MG tablet TAKE 1 TABLET EVERY DAY 90 tablet 2   doxycycline (VIBRA-TABS) 100 MG tablet Take 1 tablet (100 mg total) by mouth 2 (two) times daily. 14 tablet 0   glipiZIDE (GLUCOTROL XL) 10 MG 24 hr tablet Take 1 tablet (10 mg total) by mouth daily with breakfast. 90 tablet 3   glipiZIDE (GLUCOTROL XL) 5 MG 24 hr tablet TAKE 1 TABLET (5 MG TOTAL) BY MOUTH DAILY WITH BREAKFAST. TAKE WITH 10 MG TABLET FOR 15 MG TOTAL 90 tablet 1   glucose blood (TRUE METRIX BLOOD GLUCOSE TEST) test strip TEST AS DIRECTED DAILY 100 strip 1   icosapent Ethyl (VASCEPA) 1 g capsule Take 2 capsules (2 g total) by mouth 2 (two) times daily. 360 capsule 3   isosorbide mononitrate (IMDUR) 60 MG 24 hr tablet TAKE 1 TABLET EVERY DAY 90 tablet 3   loratadine (CLARITIN) 10 MG tablet Take 10 mg by mouth daily as needed for allergies.     metFORMIN (GLUCOPHAGE) 500 MG tablet TAKE 1 TABLET TWICE DAILY WITH MEALS 180 tablet 1   metoprolol succinate (TOPROL-XL) 25 MG 24 hr tablet TAKE 1  TABLET IN THE MORNING AND AT BEDTIME 180 tablet 2   MULTIPLE VITAMIN-FOLIC ACID PO Take 1 tablet by mouth daily. Contains 185 mg folic acid     nitroGLYCERIN (NITROSTAT) 0.4 MG SL tablet Place 1 tablet (0.4 mg total) under the tongue every 5 (five) minutes as needed for chest pain. 25 tablet 3   omeprazole (PRILOSEC) 20 MG capsule TAKE 1 CAPSULE TWICE DAILY 180 capsule 3   potassium chloride SA (KLOR-CON M) 20 MEQ tablet TAKE 1 TABLET EVERY DAY 90 tablet 1   predniSONE (DELTASONE) 20 MG tablet Take 1 tablet (20 mg total) by mouth daily with breakfast. 5 tablet 0   REPATHA SURECLICK 501 MG/ML SOAJ INJECT 1 PEN INTO THE SKIN EVERY 14 (FOURTEEN) DAYS. 6 mL 3   triamcinolone cream (KENALOG) 0.1 % Apply 1 application topically 2 (two) times daily. Use as needed for dry or itchy skin (Patient taking differently: Apply 1 application  topically daily. Use as needed for dry or itchy skin) 30 g 2   TRUEplus Lancets 33G MISC USE AS DIRECTED ONCE A DAY. 100 each 1   valsartan-hydrochlorothiazide (DIOVAN-HCT) 160-25 MG tablet TAKE 1 TABLET EVERY DAY 90 tablet 3   Na Sulfate-K Sulfate-Mg Sulf 17.5-3.13-1.6 GM/177ML SOLN Take 1 kit by mouth as directed. May use generic SUPREP;NO prior authorizations will  be done.Please use Singlecare or GOOD-RX coupon. (Patient not taking: Reported on 03/25/2022) 354 mL 0   No current facility-administered medications for this visit.    Allergies: Other, Rosuvastatin, Ticlopidine hcl, Black pepper [piper], Codeine, and Tape  Past Medical History:  Diagnosis Date   Anginal pain (HCC)    Arthritis    Barrett's esophagus    BPH (benign prostatic hypertrophy)    CAD (coronary artery disease)     a. CABG 1991;  b.  [place];  c.  S/P PTCA  stents to SVG ot OM last in 2003;  d.  Redo CABG x 2 in 2004 (VG->OM->LCX, LRA->PDA);  e. 12/2011 Cath: 3vd, sev LM into LCX dzs, VG->OM 100, LIMA->DIAG->LAD patent, Rad Art->PDA patent, EF 45%, Med Rx.   Diverticulitis 12/19/2021    Diverticulosis    DM (diabetes mellitus) ([place])    Dyslipidemia    Dyspnea    Gallstones    GERD (gastroesophageal reflux disease)    Gout    History of hiatal hernia    HTN (hypertension)    Hyperlipidemia    Leukemia in remission ([place])    Myocardial infarction ([place])    at age 8   Psoriasis    Rheumatic fever    as a child   S/P coronary artery stent placement 1998   TIA (transient ischemic attack)    FOUND ON MRI  PER PATIENT-     NO PROBLEM    Past Surgical History:  Procedure Laterality Date   APPENDECTOMY     CHOLECYSTECTOMY N/A 09/18/2016   Procedure: LAPAROSCOPIC CHOLECYSTECTOMY WITH INTRAOPERATIVE CHOLANGIOGRAM;  Surgeon: Jackolyn Confer, MD;  Location: [facility] OR;  Service: General;  Laterality: N/A;   COLONOSCOPY  07/2013   COLONOSCOPY WITH PROPOFOL  07/21/2016   Dr.Armbruster   CORONARY ANGIOPLASTY     STENTS   CORONARY ARTERY BYPASS GRAFT     1991 (LIMA to LAD/Diag;  SVG to RCA;  SVG to OM)   CORONARY ARTERY BYPASS GRAFT     2004 (L radial to PDA; SVG to OM1/ distal LCx)   POLYPECTOMY     TONSILLECTOMY AND ADENOIDECTOMY     UMBILICAL HERNIA REPAIR      Family History  Problem Relation Age of Onset   Heart disease Mother    Hypertension Mother    Hyperlipidemia Mother    Diabetes Mother    Skin cancer Mother        skin   Heart disease Father    Hypertension Father    Hyperlipidemia Father    Diabetes Father    Stroke Sister    Stroke Brother    Colon cancer Neg Hx    Esophageal cancer Neg Hx    Stomach cancer Neg Hx    Rectal cancer Neg Hx     Social History   Tobacco Use   Smoking status: Former    Packs/day: 0.50    Years: 8.00    Total pack years: 4.00    Types: Cigarettes    Quit date: 06/24/1971    Years since quitting: 50.7   Smokeless tobacco: Never  Substance Use Topics   Alcohol use: No    Subjective:  Cough/ chest congestion x 2-3 weeks; was seen [facility] and diagnosed with viral URI; was started on prednisone x 6 days and  Doxycyline x 10 days; concerned about lingering congestion; no CXR done at [facility]; no fever, no night sweats; no shortness of breath;     Objective:  Vitals:   03/25/22 1430  BP: 130/60  Pulse: 62  Temp: 97.8 F (36.6 C)  TempSrc: Oral  SpO2: 96%  Weight: 222 lb (100.7 kg)  Height: 6' (1.829 m)    General: Well developed, well nourished, in no acute distress  Skin : Warm and dry.  Head: Normocephalic and atraumatic  Eyes: Sclera and conjunctiva clear; pupils round and reactive to light; extraocular movements intact  Ears: External normal; canals clear; tympanic membranes normal  Oropharynx: Pink, supple. No suspicious lesions  Neck: Supple without thyromegaly, adenopathy  Lungs: Respirations unlabored; clear to auscultation bilaterally without wheeze, rales, rhonchi  CVS exam: normal rate and regular rhythm.  Neurologic: Alert and oriented; speech intact; face symmetrical; moves all extremities well; CNII-XII intact without focal deficit   Assessment:  1. Acute bronchitis, unspecified organism   2. Left shoulder pain, unspecified chronicity   3. Diabetes mellitus without complication ([place])     Plan:  Suspect initial treatment from [facility] was not a long enough regimen; will extend both prednisone and antibiotics for 5 more days; if symptoms persist, will need to get CXR; Refer to sports medicine- concern for left shoulder pain; Will update Hgba1c while patient is in office- he would like results forwarded to his PCP; he understands that his PCP will follow up with necessary medication adjustments.   No follow-ups on file.  Orders Placed This Encounter  Procedures   Basic Metabolic Panel (BMET)   Hemoglobin A1c   Ambulatory referral to Sports Medicine    Referral Priority:   Routine    Referral Type:   Consultation    Number of Visits Requested:   1    Requested Prescriptions   Signed Prescriptions Disp Refills   predniSONE (DELTASONE) 20 MG tablet 5 tablet 0    Sig: Take 1  tablet (20 mg total) by mouth daily with breakfast.   doxycycline (VIBRA-TABS) 100 MG tablet 14 tablet 0    Sig: Take 1 tablet (100 mg total) by mouth 2 (two) times daily.

## 2022-03-26 ENCOUNTER — Encounter: Payer: Self-pay | Admitting: Family Medicine

## 2022-03-26 LAB — BASIC METABOLIC PANEL
BUN: 20 mg/dL (ref 6–23)
CO2: 29 mEq/L (ref 19–32)
Calcium: 9.6 mg/dL (ref 8.4–10.5)
Chloride: 102 mEq/L (ref 96–112)
Creatinine, Ser: 1.25 mg/dL (ref 0.40–1.50)
GFR: 57 mL/min — ABNORMAL LOW (ref >60.00)
Glucose, Bld: 107 mg/dL — ABNORMAL HIGH (ref 70–99)
Potassium: 3.7 mEq/L (ref 3.5–5.1)
Sodium: 142 mEq/L (ref 135–145)

## 2022-03-26 LAB — HEMOGLOBIN A1C: Hgb A1c MFr Bld: 8.2 % — ABNORMAL HIGH (ref 4.6–6.5)

## 2022-03-31 ENCOUNTER — Ambulatory Visit: Payer: Medicare HMO | Admitting: Family Medicine

## 2022-03-31 ENCOUNTER — Encounter: Payer: Self-pay | Admitting: Family Medicine

## 2022-03-31 ENCOUNTER — Ambulatory Visit (HOSPITAL_BASED_OUTPATIENT_CLINIC_OR_DEPARTMENT_OTHER)
Admission: RE | Admit: 2022-03-31 | Discharge: 2022-03-31 | Disposition: A | Discharge status: Home / Self Care | Payer: PRIVATE HEALTH INSURANCE | Source: Ambulatory Visit | Attending: Family Medicine | Admitting: Family Medicine

## 2022-03-31 VITALS — BP 131/81 | Ht 72.0 in | Wt 222.0 lb

## 2022-03-31 DIAGNOSIS — M778 Other enthesopathies, not elsewhere classified: Secondary | ICD-10-CM | POA: Diagnosis not present

## 2022-03-31 DIAGNOSIS — M25512 Pain in left shoulder: Secondary | ICD-10-CM | POA: Diagnosis not present

## 2022-03-31 NOTE — Progress Notes (Signed)
  Brandon Stone - 76 y.o. male MRN [id number]  Date of birth: 1946-06-16  SUBJECTIVE:  Including CC & ROS.  No chief complaint on file.   Brandon Stone is a 76 y.o. male that is presenting with acute on chronic left shoulder pain.  The pain has been present for roughly 2 years.  Has tried exercises in the past.  No history of surgery.  Localized to the shoulder.    Review of Systems See HPI   HISTORY: Past Medical, Surgical, Social, and Family History Reviewed & Updated per EMR.   Pertinent Historical Findings include:  Past Medical History:  Diagnosis Date   Anginal pain ([place])    Arthritis    Barrett's esophagus    BPH (benign prostatic hypertrophy)    CAD (coronary artery disease)     a. CABG 1991;  b.  [place];  c.  S/P PTCA  stents to SVG ot OM last in 2003;  d.  Redo CABG x 2 in 2004 (VG->OM->LCX, LRA->PDA);  e. 12/2011 Cath: 3vd, sev LM into LCX dzs, VG->OM 100, LIMA->DIAG->LAD patent, Rad Art->PDA patent, EF 45%, Med Rx.   Diverticulitis 12/19/2021   Diverticulosis    DM (diabetes mellitus) ([place])    Dyslipidemia    Dyspnea    Gallstones    GERD (gastroesophageal reflux disease)    Gout    History of hiatal hernia    HTN (hypertension)    Hyperlipidemia    Leukemia in remission ([place])    Myocardial infarction ([place])    at age 58   Psoriasis    Rheumatic fever    as a child   S/P coronary artery stent placement 1998   TIA (transient ischemic attack)    FOUND ON MRI  PER PATIENT-     NO PROBLEM    Past Surgical History:  Procedure Laterality Date   APPENDECTOMY     CHOLECYSTECTOMY N/A 09/18/2016   Procedure: LAPAROSCOPIC CHOLECYSTECTOMY WITH INTRAOPERATIVE CHOLANGIOGRAM;  Surgeon: Jackolyn Confer, MD;  Location: [facility];  Service: General;  Laterality: N/A;   COLONOSCOPY  07/2013   COLONOSCOPY WITH PROPOFOL  07/21/2016   Dr.Armbruster   CORONARY ANGIOPLASTY     STENTS   CORONARY ARTERY BYPASS GRAFT     1991 (LIMA to LAD/Diag;  SVG to RCA;  SVG to OM)    CORONARY ARTERY BYPASS GRAFT     2004 (L radial to PDA; SVG to OM1/ distal LCx)   POLYPECTOMY     TONSILLECTOMY AND ADENOIDECTOMY     UMBILICAL HERNIA REPAIR       PHYSICAL EXAM:  VS: BP 131/81 (BP Location: Left Arm, Patient Position: Sitting)   Ht 6' (1.829 m)   Wt 222 lb (100.7 kg)   BMI 30.11 kg/m  Physical Exam Gen: NAD, alert, cooperative with exam, well-appearing MSK:  Neurovascularly intact       ASSESSMENT & PLAN:   Capsulitis of left shoulder Acute on chronic in nature.  Has tightness with external rotation. -Counseled on home exercise therapy and supportive care. -X-ray. -Consider injection or physical therapy.

## 2022-03-31 NOTE — Assessment & Plan Note (Signed)
Acute on chronic in nature.  Has tightness with external rotation. -Counseled on home exercise therapy and supportive care. -X-ray. -Consider injection or physical therapy.

## 2022-03-31 NOTE — Patient Instructions (Signed)
Nice to meet you Please try heat before exercise and ice after  Please try the exercises  I will call with the xray results.   Please send me a message in MyChart with any questions or updates.  Please see me back in 4 weeks.   --Dr. Raeford Razor

## 2022-04-01 ENCOUNTER — Telehealth: Payer: Self-pay | Admitting: Family Medicine

## 2022-04-01 NOTE — Telephone Encounter (Signed)
Informed of results.   Rosemarie Ax, MD [facility] 04/01/2022, 1:46 PM

## 2022-04-16 ENCOUNTER — Other Ambulatory Visit: Payer: Self-pay | Admitting: Internal Medicine

## 2022-04-16 ENCOUNTER — Other Ambulatory Visit: Payer: Self-pay | Admitting: Family Medicine

## 2022-04-16 DIAGNOSIS — E119 Type 2 diabetes mellitus without complications: Secondary | ICD-10-CM

## 2022-04-16 MED ORDER — GLIPIZIDE ER 5 MG PO TB24: 5.0000 mg | ORAL_TABLET | Freq: Every day | ORAL | 0 refills | Status: DC

## 2022-04-21 ENCOUNTER — Other Ambulatory Visit: Payer: Self-pay | Admitting: Internal Medicine

## 2022-05-01 ENCOUNTER — Ambulatory Visit: Payer: Self-pay

## 2022-05-01 ENCOUNTER — Ambulatory Visit: Payer: Medicare HMO | Admitting: Family Medicine

## 2022-05-01 VITALS — BP 138/74 | Ht 71.0 in | Wt 215.0 lb

## 2022-05-01 DIAGNOSIS — M778 Other enthesopathies, not elsewhere classified: Secondary | ICD-10-CM

## 2022-05-01 MED ORDER — TRIAMCINOLONE ACETONIDE 40 MG/ML IJ SUSP
40.0000 mg | Freq: Once | INTRAMUSCULAR | Status: AC
  Administered 2022-05-01: 40 mg via INTRA_ARTICULAR

## 2022-05-01 NOTE — Patient Instructions (Signed)
Good to see you Please use heat before exercise and ice after  Please continue the exercises   Please send me a message in MyChart with any questions or updates.  Please see me back in 6 weeks.   --Dr. Raeford Razor

## 2022-05-01 NOTE — Addendum Note (Signed)
Addended by: Marva Panda on: 05/01/2022 11:02 AM   Modules accepted: Orders

## 2022-05-01 NOTE — Assessment & Plan Note (Signed)
Acutely occurring with ongoing pain and limited range of motion. Imaging was reassuring.  - counseled on home exercise therapy and supportive care  - injection today  - could consider physical therapy

## 2022-05-01 NOTE — Progress Notes (Signed)
ARMISTEAD SULT - 76 y.o. male MRN [id number]  Date of birth: [birth year]  SUBJECTIVE:  Including CC & ROS.  No chief complaint on file.   ABDULAHI SCHOR is a 76 y.o. male that is  following up for his left shoulder pain. Pain is still a problem and continued limited range of motion.    Review of Systems See HPI   HISTORY: Past Medical, Surgical, Social, and Family History Reviewed & Updated per EMR.   Pertinent Historical Findings include:  Past Medical History:  Diagnosis Date   Anginal pain ([place])    Arthritis    Barrett's esophagus    BPH (benign prostatic hypertrophy)    CAD (coronary artery disease)     a. CABG 1991;  b.  [place];  c.  S/P PTCA  stents to SVG ot OM last in 2003;  d.  Redo CABG x 2 in 2004 (VG->OM->LCX, LRA->PDA);  e. 12/2011 Cath: 3vd, sev LM into LCX dzs, VG->OM 100, LIMA->DIAG->LAD patent, Rad Art->PDA patent, EF 45%, Med Rx.   Diverticulitis 12/19/2021   Diverticulosis    DM (diabetes mellitus) ([place])    Dyslipidemia    Dyspnea    Gallstones    GERD (gastroesophageal reflux disease)    Gout    History of hiatal hernia    HTN (hypertension)    Hyperlipidemia    Leukemia in remission ([place])    Myocardial infarction ([place])    at age 42   Psoriasis    Rheumatic fever    as a child   S/P coronary artery stent placement 1998   TIA (transient ischemic attack)    FOUND ON MRI  PER PATIENT-     NO PROBLEM    Past Surgical History:  Procedure Laterality Date   APPENDECTOMY     CHOLECYSTECTOMY N/A 09/18/2016   Procedure: LAPAROSCOPIC CHOLECYSTECTOMY WITH INTRAOPERATIVE CHOLANGIOGRAM;  Surgeon: Jackolyn Confer, MD;  Location: [facility];  Service: General;  Laterality: N/A;   COLONOSCOPY  07/2013   COLONOSCOPY WITH PROPOFOL  07/21/2016   Dr.Armbruster   CORONARY ANGIOPLASTY     STENTS   CORONARY ARTERY BYPASS GRAFT     1991 (LIMA to LAD/Diag;  SVG to RCA;  SVG to OM)   CORONARY ARTERY BYPASS GRAFT     2004 (L radial to PDA; SVG to OM1/ distal LCx)    POLYPECTOMY     TONSILLECTOMY AND ADENOIDECTOMY     UMBILICAL HERNIA REPAIR       PHYSICAL EXAM:  VS: BP 138/74   Ht '5\' 11"'$  (1.803 m)   Wt 215 lb (97.5 kg)   BMI 29.99 kg/m  Physical Exam Gen: NAD, alert, cooperative with exam, well-appearing MSK:  Neurovascularly intact     Aspiration/Injection Procedure Note SHIRAZ BASTYR [birth year]  Procedure: Injection Indications: left shoulder pain  Procedure Details Consent: Risks of procedure as well as the alternatives and risks of each were explained to the (patient/caregiver).  Consent for procedure obtained. Time Out: Verified patient identification, verified procedure, site/side was marked, verified correct patient position, special equipment/implants available, medications/allergies/relevent history reviewed, required imaging and test results available.  Performed.  The area was cleaned with iodine and alcohol swabs.    The left glenohumeral joint was injected using 3 cc's of 1% lidocaine on a 22-gauge 3-1/2 inch needle.  The syringe was switched and a mixture containing 3 cc of 1% lidocaine, 1 cc's of 40 mg Kenalog and 3 cc's of 0.25% bupivacaine was injected.  Ultrasound was used. Images were obtained in short views showing the injection.     A sterile dressing was applied.  Patient did tolerate procedure well.     ASSESSMENT & PLAN:   Capsulitis of left shoulder Acutely occurring with ongoing pain and limited range of motion. Imaging was reassuring.  - counseled on home exercise therapy and supportive care  - injection today  - could consider physical therapy

## 2022-05-06 ENCOUNTER — Ambulatory Visit (INDEPENDENT_AMBULATORY_CARE_PROVIDER_SITE_OTHER): Payer: Medicare HMO | Admitting: Family Medicine

## 2022-05-06 ENCOUNTER — Encounter: Payer: Self-pay | Admitting: Family Medicine

## 2022-05-06 VITALS — BP 134/78 | HR 80 | Temp 98.0°F | Ht 71.0 in | Wt 226.5 lb

## 2022-05-06 DIAGNOSIS — T5994XA Toxic effect of unspecified gases, fumes and vapors, undetermined, initial encounter: Secondary | ICD-10-CM | POA: Diagnosis not present

## 2022-05-06 DIAGNOSIS — J680 Bronchitis and pneumonitis due to chemicals, gases, fumes and vapors: Secondary | ICD-10-CM | POA: Diagnosis not present

## 2022-05-06 MED ORDER — METHYLPREDNISOLONE ACETATE 80 MG/ML IJ SUSP
80.0000 mg | Freq: Once | INTRAMUSCULAR | Status: AC
  Administered 2022-05-06: 80 mg via INTRAMUSCULAR

## 2022-05-06 NOTE — Patient Instructions (Addendum)
Continue to push fluids, practice good hand hygiene, and cover your mouth if you cough.  If you start having fevers, shaking or shortness of breath, seek immediate care.  Please call the health department on them if this happens again.   Let us know if you need anything.
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
## 2022-05-25 ENCOUNTER — Other Ambulatory Visit: Payer: Self-pay | Admitting: Family Medicine

## 2022-06-02 ENCOUNTER — Other Ambulatory Visit: Payer: Self-pay

## 2022-06-02 ENCOUNTER — Inpatient Hospital Stay: Payer: Medicare HMO | Attending: Hematology & Oncology

## 2022-06-02 ENCOUNTER — Encounter: Payer: Self-pay | Admitting: Hematology & Oncology

## 2022-06-02 ENCOUNTER — Inpatient Hospital Stay (HOSPITAL_BASED_OUTPATIENT_CLINIC_OR_DEPARTMENT_OTHER): Payer: Medicare HMO | Admitting: Hematology & Oncology

## 2022-06-02 VITALS — BP 144/78 | HR 77 | Temp 98.0°F | Resp 18 | Ht 71.0 in | Wt 226.0 lb

## 2022-06-02 DIAGNOSIS — C9110 Chronic lymphocytic leukemia of B-cell type not having achieved remission: Secondary | ICD-10-CM

## 2022-06-02 LAB — CBC WITH DIFFERENTIAL (CANCER CENTER ONLY)
Abs Immature Granulocytes: 0.04 10*3/uL (ref 0.00–0.07)
Basophils Absolute: 0.1 10*3/uL (ref 0.0–0.1)
Basophils Relative: 0 %
Eosinophils Absolute: 0.2 10*3/uL (ref 0.0–0.5)
Eosinophils Relative: 1 %
HCT: 43.5 % (ref 39.0–52.0)
Hemoglobin: 14.8 g/dL (ref 13.0–17.0)
Immature Granulocytes: 0 %
Lymphocytes Relative: 69 %
Lymphs Abs: 10 10*3/uL — ABNORMAL HIGH (ref 0.7–4.0)
MCH: 31.6 pg (ref 26.0–34.0)
MCHC: 34 g/dL (ref 30.0–36.0)
MCV: 92.8 fL (ref 80.0–100.0)
Monocytes Absolute: 0.9 10*3/uL (ref 0.1–1.0)
Monocytes Relative: 6 %
Neutro Abs: 3.5 10*3/uL (ref 1.7–7.7)
Neutrophils Relative %: 24 %
Platelet Count: 215 10*3/uL (ref 150–400)
RBC: 4.69 MIL/uL (ref 4.22–5.81)
RDW: 12.7 % (ref 11.5–15.5)
Smear Review: NORMAL
WBC Count: 14.6 10*3/uL — ABNORMAL HIGH (ref 4.0–10.5)
nRBC: 0 % (ref 0.0–0.2)

## 2022-06-02 LAB — CMP (CANCER CENTER ONLY)
ALT: 17 U/L (ref 0–44)
AST: 17 U/L (ref 15–41)
Albumin: 4.7 g/dL (ref 3.5–5.0)
Alkaline Phosphatase: 63 U/L (ref 38–126)
Anion gap: 8 (ref 5–15)
BUN: 20 mg/dL (ref 8–23)
CO2: 34 mmol/L — ABNORMAL HIGH (ref 22–32)
Calcium: 9.8 mg/dL (ref 8.9–10.3)
Chloride: 102 mmol/L (ref 98–111)
Creatinine: 1.15 mg/dL (ref 0.61–1.24)
GFR, Estimated: >60 mL/min (ref >60)
Glucose, Bld: 165 mg/dL — ABNORMAL HIGH (ref 70–99)
Potassium: 3.6 mmol/L (ref 3.5–5.1)
Sodium: 144 mmol/L (ref 135–145)
Total Bilirubin: 0.9 mg/dL (ref 0.3–1.2)
Total Protein: 7.1 g/dL (ref 6.5–8.1)

## 2022-06-02 LAB — LACTATE DEHYDROGENASE: LDH: 128 U/L (ref 98–192)

## 2022-06-02 LAB — SAVE SMEAR(SSMR), FOR PROVIDER SLIDE REVIEW
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
## 2022-06-03 LAB — IGG, IGA, IGM
IgA: 58 mg/dL — ABNORMAL LOW (ref 61–437)
IgG (Immunoglobin G), Serum: 691 mg/dL (ref 603–1613)
IgM (Immunoglobulin M), Srm: 39 mg/dL (ref 15–143)

## 2022-06-12 ENCOUNTER — Ambulatory Visit: Payer: Medicare HMO | Admitting: Family Medicine

## 2022-06-13 ENCOUNTER — Other Ambulatory Visit: Payer: Self-pay | Admitting: Internal Medicine

## 2022-06-22 DIAGNOSIS — J189 Pneumonia, unspecified organism: Secondary | ICD-10-CM | POA: Diagnosis not present

## 2022-06-22 DIAGNOSIS — R531 Weakness: Secondary | ICD-10-CM | POA: Diagnosis not present

## 2022-06-22 DIAGNOSIS — R051 Acute cough: Secondary | ICD-10-CM | POA: Diagnosis not present

## 2022-06-24 ENCOUNTER — Ambulatory Visit: Payer: Medicare HMO | Admitting: Family Medicine

## 2022-07-02 ENCOUNTER — Other Ambulatory Visit (HOSPITAL_COMMUNITY): Payer: Self-pay

## 2022-07-10 ENCOUNTER — Encounter: Payer: Self-pay | Admitting: Family Medicine

## 2022-07-10 ENCOUNTER — Ambulatory Visit: Payer: Medicare HMO | Admitting: Family Medicine

## 2022-07-10 VITALS — BP 128/80 | Ht 72.0 in | Wt 215.0 lb

## 2022-07-10 DIAGNOSIS — M778 Other enthesopathies, not elsewhere classified: Secondary | ICD-10-CM | POA: Diagnosis not present

## 2022-07-10 NOTE — Patient Instructions (Signed)
Good to see you Please use heat before exercises  Please continue the exercises   Please send me a message in MyChart with any questions or updates.  Please see me back as needed.   --Dr. Raeford Razor

## 2022-07-10 NOTE — Progress Notes (Signed)
  Brandon Stone - 77 y.o. male MRN [id number]  Date of birth: 1945-08-14  SUBJECTIVE:  Including CC & ROS.  No chief complaint on file.   Brandon Stone is a 77 y.o. male that is  following up for his left shoulder pain. Has been active and still notices limitations in his range of motion. Pain has improved.    Review of Systems See HPI   HISTORY: Past Medical, Surgical, Social, and Family History Reviewed & Updated per EMR.   Pertinent Historical Findings include:  Past Medical History:  Diagnosis Date   Anginal pain ([place])    Arthritis    Barrett's esophagus    BPH (benign prostatic hypertrophy)    CAD (coronary artery disease)     a. CABG 1991;  b.  [place];  c.  S/P PTCA  stents to SVG ot OM last in 2003;  d.  Redo CABG x 2 in 2004 (VG->OM->LCX, LRA->PDA);  e. 12/2011 Cath: 3vd, sev LM into LCX dzs, VG->OM 100, LIMA->DIAG->LAD patent, Rad Art->PDA patent, EF 45%, Med Rx.   Diverticulitis 12/19/2021   Diverticulosis    DM (diabetes mellitus) ([place])    Dyslipidemia    Dyspnea    Gallstones    GERD (gastroesophageal reflux disease)    Gout    History of hiatal hernia    HTN (hypertension)    Hyperlipidemia    Leukemia in remission ([place])    Myocardial infarction ([place])    at age 73   Psoriasis    Rheumatic fever    as a child   S/P coronary artery stent placement 1998   TIA (transient ischemic attack)    FOUND ON MRI  PER PATIENT-     NO PROBLEM    Past Surgical History:  Procedure Laterality Date   APPENDECTOMY     CHOLECYSTECTOMY N/A 09/18/2016   Procedure: LAPAROSCOPIC CHOLECYSTECTOMY WITH INTRAOPERATIVE CHOLANGIOGRAM;  Surgeon: Jackolyn Confer, MD;  Location: [facility];  Service: General;  Laterality: N/A;   COLONOSCOPY  07/2013   COLONOSCOPY WITH PROPOFOL  07/21/2016   Dr.Armbruster   CORONARY ANGIOPLASTY     STENTS   CORONARY ARTERY BYPASS GRAFT     1991 (LIMA to LAD/Diag;  SVG to RCA;  SVG to OM)   CORONARY ARTERY BYPASS GRAFT     2004 (L radial to PDA; SVG  to OM1/ distal LCx)   POLYPECTOMY     TONSILLECTOMY AND ADENOIDECTOMY     UMBILICAL HERNIA REPAIR       PHYSICAL EXAM:  VS: BP 128/80 (BP Location: Left Arm, Patient Position: Sitting)   Ht 6' (1.829 m)   Wt 215 lb (97.5 kg)   BMI 29.16 kg/m  Physical Exam Gen: NAD, alert, cooperative with exam, well-appearing MSK:  Neurovascularly intact       ASSESSMENT & PLAN:   Capsulitis of left shoulder Doing well with modalities in place. Mild limitation in his range of motion.  - counseled on home exercise therapy and supportive care - could consider repeat injection or PT.

## 2022-07-10 NOTE — Assessment & Plan Note (Signed)
Doing well with modalities in place. Mild limitation in his range of motion.  - counseled on home exercise therapy and supportive care - could consider repeat injection or PT.

## 2022-07-17 DIAGNOSIS — H05012 Cellulitis of left orbit: Secondary | ICD-10-CM | POA: Diagnosis not present

## 2022-07-17 DIAGNOSIS — R0981 Nasal congestion: Secondary | ICD-10-CM | POA: Diagnosis not present

## 2022-07-18 ENCOUNTER — Other Ambulatory Visit: Payer: Self-pay | Admitting: Family Medicine

## 2022-07-31 DIAGNOSIS — E119 Type 2 diabetes mellitus without complications: Secondary | ICD-10-CM | POA: Diagnosis not present

## 2022-07-31 DIAGNOSIS — Z7984 Long term (current) use of oral hypoglycemic drugs: Secondary | ICD-10-CM | POA: Diagnosis not present

## 2022-07-31 DIAGNOSIS — D3130 Benign neoplasm of unspecified choroid: Secondary | ICD-10-CM | POA: Diagnosis not present

## 2022-07-31 DIAGNOSIS — H25013 Cortical age-related cataract, bilateral: Secondary | ICD-10-CM | POA: Diagnosis not present

## 2022-07-31 DIAGNOSIS — H2513 Age-related nuclear cataract, bilateral: Secondary | ICD-10-CM | POA: Diagnosis not present

## 2022-08-04 ENCOUNTER — Ambulatory Visit (INDEPENDENT_AMBULATORY_CARE_PROVIDER_SITE_OTHER): Payer: Medicare HMO | Admitting: Family Medicine

## 2022-08-04 ENCOUNTER — Encounter: Payer: Self-pay | Admitting: Family Medicine

## 2022-08-04 ENCOUNTER — Ambulatory Visit (HOSPITAL_BASED_OUTPATIENT_CLINIC_OR_DEPARTMENT_OTHER)
Admission: RE | Admit: 2022-08-04 | Discharge: 2022-08-04 | Disposition: A | Discharge status: Home / Self Care | Payer: Medicare HMO | Source: Ambulatory Visit | Attending: Family Medicine | Admitting: Family Medicine

## 2022-08-04 VITALS — BP 142/86 | HR 79 | Temp 97.7°F | Resp 18 | Ht 72.0 in | Wt 225.4 lb

## 2022-08-04 DIAGNOSIS — Z125 Encounter for screening for malignant neoplasm of prostate: Secondary | ICD-10-CM | POA: Diagnosis not present

## 2022-08-04 DIAGNOSIS — I10 Essential (primary) hypertension: Secondary | ICD-10-CM

## 2022-08-04 DIAGNOSIS — R053 Chronic cough: Secondary | ICD-10-CM | POA: Insufficient documentation

## 2022-08-04 DIAGNOSIS — E119 Type 2 diabetes mellitus without complications: Secondary | ICD-10-CM

## 2022-08-04 DIAGNOSIS — E785 Hyperlipidemia, unspecified: Secondary | ICD-10-CM | POA: Diagnosis not present

## 2022-08-04 DIAGNOSIS — R059 Cough, unspecified: Secondary | ICD-10-CM | POA: Diagnosis not present

## 2022-08-04 LAB — LIPID PANEL
Cholesterol: 98 mg/dL (ref 0–200)
HDL: 38.4 mg/dL — ABNORMAL LOW (ref >39.00)
NonHDL: 59.26
Total CHOL/HDL Ratio: 3
Triglycerides: 208 mg/dL — ABNORMAL HIGH (ref 0.0–149.0)
VLDL: 41.6 mg/dL — ABNORMAL HIGH (ref 0.0–40.0)

## 2022-08-04 LAB — BASIC METABOLIC PANEL
BUN: 14 mg/dL (ref 6–23)
CO2: 28 mEq/L (ref 19–32)
Calcium: 9.5 mg/dL (ref 8.4–10.5)
Chloride: 102 mEq/L (ref 96–112)
Creatinine, Ser: 1.02 mg/dL (ref 0.40–1.50)
GFR: 72.57 mL/min (ref >60.00)
Glucose, Bld: 148 mg/dL — ABNORMAL HIGH (ref 70–99)
Potassium: 3.6 mEq/L (ref 3.5–5.1)
Sodium: 142 mEq/L (ref 135–145)

## 2022-08-04 LAB — PSA: PSA: 1.33 ng/mL (ref 0.10–4.00)

## 2022-08-04 LAB — HEMOGLOBIN A1C: Hgb A1c MFr Bld: 8.3 % — ABNORMAL HIGH (ref 4.6–6.5)

## 2022-08-04 MED ORDER — FLUTICASONE-SALMETEROL 100-50 MCG/ACT IN AEPB: 1.0000 | INHALATION_SPRAY | Freq: Two times a day (BID) | RESPIRATORY_TRACT | 3 refills | Status: DC

## 2022-08-04 NOTE — Patient Instructions (Addendum)
It was good to see you today- I am going to request records from the urgent care so I can better understand what has been going on lately  For now let's have you start on the inhaler one puff twice a day, go to lab and then to x-ray on the ground floor for a chest film

## 2022-08-04 NOTE — Progress Notes (Signed)
[facility] [address], [place], [place] [address] [phone]  Date:  08/04/2022   Name:  Brandon Stone   DOB:  April 08, 1946   MRN:  [id number]  PCP:  Darreld Mclean, MD    Chief Complaint: Follow-up (Onset 06/02/2022/Has tried prednisone and antibiotics )   History of Present Illness:  Brandon Stone is a 77 y.o. very pleasant male patient who presents with the following:  Pt seen today with concern of illness Last seen by myself 6/23  History of diabetes, CLL, hyperlipidemia, hypertension, CAD, subdural hematoma March 2020 He underwent CABG about 20 years ago  Seen by oncology in December-  stable stage A CLL, recheck in one year  Pt notes he was seen back in November with cough and pneumonitis He was treated with a shot of steroids He then went to [facility] in December at some point and was treated with oral steroids and an antibiotic- he thinks he may have been treated with more than one round  He did get better and seemed to be nearly back to normal   Then about 3 weeks ago he was treated for conjunctivitis, this resolved with abx ointment He was also given an oral antibiotic for cough but he is not sure what this was  We can call and find out -however [facility]   He went to an UC in [place]- I will request records  [facility] urgent care  Address: [address], [place], [place] [address] Phone: [phone]  Received records from [facility] urgent care, unfortunately I cannot really read these handwritten notes and they are not very helpful.  We did have a chest x-ray report from December 6 which is negative  Right now he notes recurrent cough for the last 3 weeks He may bring up some pale yellow, nearly clear mucus He notes some discomfort in her left throat from cough No fever noted since back in December Not really having nasal congestion No vomiting  He did have a  little diarrhea yesterday but not a big deal- now resolved    Lab Results  Component Value Date   HGBA1C 8.2 (H) 03/25/2022    Patient Active Problem List   Diagnosis Date Noted   Capsulitis of left shoulder 03/31/2022   AAA (abdominal aortic aneurysm) ([place]) 10/31/2019   Subdural hematoma ([place]) 09/08/2018   Diabetes mellitus without complication ([place]) 123456   Symptomatic cholelithiasis 09/18/2016   Chronic lymphocytic leukemia (CLL), B-cell ([place]) 05/13/2014   Stable angina 01/26/2012   Hyperlipidemia 06/21/2008   HYPERTENSION, BENIGN 06/21/2008   CAD, NATIVE VESSEL 06/21/2008    Past Medical History:  Diagnosis Date   Anginal pain ([place])    Arthritis    Barrett's esophagus    BPH (benign prostatic hypertrophy)    CAD (coronary artery disease)     a. CABG 1991;  b.  [place];  c.  S/P PTCA  stents to SVG ot OM last in 2003;  d.  Redo CABG x 2 in 2004 (VG->OM->LCX, LRA->PDA);  e. 12/2011 Cath: 3vd, sev LM into LCX dzs, VG->OM 100, LIMA->DIAG->LAD patent, Rad Art->PDA patent, EF 45%, Med Rx.   Diverticulitis 12/19/2021   Diverticulosis    DM (diabetes mellitus) ([place])    Dyslipidemia    Dyspnea    Gallstones    GERD (gastroesophageal reflux disease)    Gout  History of hiatal hernia    HTN (hypertension)    Hyperlipidemia    Leukemia in remission ([place])    Myocardial infarction ([place])    at age 51   Psoriasis    Rheumatic fever    as a child   S/P coronary artery stent placement 1998   TIA (transient ischemic attack)    FOUND ON MRI  PER PATIENT-     NO PROBLEM    Past Surgical History:  Procedure Laterality Date   APPENDECTOMY     CHOLECYSTECTOMY N/A 09/18/2016   Procedure: LAPAROSCOPIC CHOLECYSTECTOMY WITH INTRAOPERATIVE CHOLANGIOGRAM;  Surgeon: Jackolyn Confer, MD;  Location: [facility];  Service: General;  Laterality: N/A;   COLONOSCOPY  07/2013   COLONOSCOPY WITH PROPOFOL  07/21/2016   Dr.Armbruster   CORONARY ANGIOPLASTY     STENTS   CORONARY ARTERY  BYPASS GRAFT     1991 (LIMA to LAD/Diag;  SVG to RCA;  SVG to OM)   CORONARY ARTERY BYPASS GRAFT     2004 (L radial to PDA; SVG to OM1/ distal LCx)   POLYPECTOMY     TONSILLECTOMY AND ADENOIDECTOMY     UMBILICAL HERNIA REPAIR      Social History   Tobacco Use   Smoking status: Former    Packs/day: 0.50    Years: 8.00    Total pack years: 4.00    Types: Cigarettes    Quit date: 06/24/1971    Years since quitting: 51.1   Smokeless tobacco: Never  Vaping Use   Vaping Use: Never used  Substance Use Topics   Alcohol use: No   Drug use: No    Family History  Problem Relation Age of Onset   Heart disease Mother    Hypertension Mother    Hyperlipidemia Mother    Diabetes Mother    Skin cancer Mother        skin   Heart disease Father    Hypertension Father    Hyperlipidemia Father    Diabetes Father    Stroke Sister    Stroke Brother    Colon cancer Neg Hx    Esophageal cancer Neg Hx    Stomach cancer Neg Hx    Rectal cancer Neg Hx     Allergies  Allergen Reactions   Black Pepper [[place]] Palpitations   Codeine Palpitations and Other (See Comments)    Wild dreams, palpitations   Other Hives and Rash    opsite and tegaderm tape.   Rosuvastatin Nausea Only   Tape Rash    Blisters from Tegaderm and tape with orange backing(opsite?)   Ticlopidine Hcl Hives and Itching    Medication list has been reviewed and updated.  Current Outpatient Medications on File Prior to Visit  Medication Sig Dispense Refill   acetaminophen (TYLENOL) 325 MG tablet Take 325 mg by mouth every 6 (six) hours as needed for mild pain.     allopurinol (ZYLOPRIM) 300 MG tablet TAKE 1 TABLET EVERY DAY 90 tablet 1   amLODipine (NORVASC) 10 MG tablet Take 1 tablet (10 mg total) by mouth daily. 90 tablet 3   aspirin EC 81 MG tablet Take 81 mg by mouth daily.     atorvastatin (LIPITOR) 40 MG tablet TAKE 1 TABLET EVERY DAY 90 tablet 2   Evolocumab (REPATHA SURECLICK) [id number] MG/ML SOAJ INJECT 1 PEN INTO  THE SKIN EVERY 14 (FOURTEEN) DAYS. 6 mL 3   glipiZIDE (GLUCOTROL XL) 10 MG 24 hr tablet Take 1 tablet (10 mg  total) by mouth daily with breakfast. 90 tablet 0   glipiZIDE (GLUCOTROL XL) 5 MG 24 hr tablet TAKE 1 TABLET DAILY WITH BREAKFAST. TAKE WITH 10 MG TABLET FOR 15 MG TOTAL 90 tablet 3   glucose blood (TRUE METRIX BLOOD GLUCOSE TEST) test strip TEST AS DIRECTED DAILY 100 strip 1   icosapent Ethyl (VASCEPA) 1 g capsule Take 2 capsules (2 g total) by mouth 2 (two) times daily. 360 capsule 3   isosorbide mononitrate (IMDUR) 60 MG 24 hr tablet TAKE 1 TABLET EVERY DAY 90 tablet 1   loratadine (CLARITIN) 10 MG tablet Take 10 mg by mouth daily as needed for allergies.     metFORMIN (GLUCOPHAGE) 500 MG tablet TAKE 1 TABLET TWICE DAILY WITH MEALS 180 tablet 3   metoprolol succinate (TOPROL-XL) 25 MG 24 hr tablet TAKE 1 TABLET IN THE MORNING AND AT BEDTIME 180 tablet 2   MULTIPLE VITAMIN-FOLIC ACID PO Take 1 tablet by mouth daily. Contains [id number] mg folic acid     nitroGLYCERIN (NITROSTAT) 0.4 MG SL tablet Place 1 tablet (0.4 mg total) under the tongue every 5 (five) minutes as needed for chest pain. 25 tablet 3   omeprazole (PRILOSEC) 20 MG capsule TAKE 1 CAPSULE TWICE DAILY 180 capsule 3   potassium chloride SA (KLOR-CON M) 20 MEQ tablet TAKE 1 TABLET EVERY DAY 90 tablet 1   triamcinolone cream (KENALOG) 0.1 % Apply 1 application topically 2 (two) times daily. Use as needed for dry or itchy skin (Patient taking differently: Apply 1 application  topically daily. Use as needed for dry or itchy skin) 30 g 2   TRUEplus Lancets 33G MISC USE AS DIRECTED ONCE A DAY. 100 each 1   valsartan-hydrochlorothiazide (DIOVAN-HCT) 160-25 MG tablet TAKE 1 TABLET EVERY DAY 90 tablet 1   No current facility-administered medications on file prior to visit.    Review of Systems:  As per HPI- otherwise negative.   Physical Examination: Vitals:   08/04/22 0957  BP: (!) 142/86  Pulse: 79  Resp: 18  Temp: 97.7 F  (36.5 C)  SpO2: 99%   Vitals:   08/04/22 0957  Weight: 225 lb 6.4 oz (102.2 kg)  Height: 6' (1.829 m)   Body mass index is 30.57 kg/m. Ideal Body Weight: Weight in (lb) to have BMI = 25: 183.9  GEN: no acute distress.  Obese, looks well  HEENT: Atraumatic, Normocephalic.  Bilateral TM wnl, oropharynx normal.  PEERL,EOMI.   Ears and Nose: No external deformity. CV: RRR, No M/G/R. No JVD. No thrill. No extra heart sounds. PULM: CTA B, no wheezes, crackles, rhonchi. No retractions. No resp. distress. No accessory muscle use. ABD: S, NT, ND, +BS. No rebound. No HSM. EXTR: No c/c/e PSYCH: Normally interactive. Conversant.    Assessment and Plan: Hyperlipidemia, unspecified hyperlipidemia type - Plan: Lipid panel  HYPERTENSION, BENIGN - Plan: Basic metabolic panel  Diabetes mellitus without complication ([place]) - Plan: Hemoglobin A1c  Screening for prostate cancer - Plan: PSA  Chronic cough - Plan: DG Chest 2 View, fluticasone-salmeterol (ADVAIR) 100-50 MCG/ACT AEPB  Patient seen today for follow-up.  As above, he has been dealing with a cough really since November.  He has been treated with oral steroids and also antibiotics, symptoms have waxed and waned but not entirely gone away.  Will obtain a chest x-ray today.  I would also like to try a steroid inhaler for him; we went with Advair due to insurance coverage (great deal of difficulty recently getting  a plain steroid inhaler approved for patients) I will be in touch with him pending his reports and lab results Asked him to let me know as over the next couple of weeks Signed Lamar Blinks, MD  Received chest film as below, message to patient  DG Chest 2 View  Result Date: 08/04/2022 CLINICAL DATA:  77 year old male with persistent cough. EXAM: CHEST - 2 VIEW COMPARISON:  Chest radiographs 05/21/2021 and earlier. FINDINGS: Chronic CABG. Calcified aortic atherosclerosis. Mediastinal contours are stable and within normal  limits. Visualized tracheal air column is within normal limits. Lung volumes are stable and within normal limits. No pneumothorax, pulmonary edema, pleural effusion or confluent pulmonary opacity. No acute osseous abnormality identified. Paucity of bowel gas the visible abdomen. Chronic cholecystectomy clips. IMPRESSION: No acute cardiopulmonary abnormality.  Chronic CABG. Electronically Signed   By: Genevie Ann M.D.   On: 08/04/2022 11:16    Addendum 2/13, received labs as below.  Message to patient Results for orders placed or performed in visit on 08/04/22  Hemoglobin A1c  Result Value Ref Range   Hgb A1c MFr Bld 8.3 (H) 4.6 - 6.5 %  Basic metabolic panel  Result Value Ref Range   Sodium 142 135 - 145 mEq/L   Potassium 3.6 3.5 - 5.1 mEq/L   Chloride 102 96 - 112 mEq/L   CO2 28 19 - 32 mEq/L   Glucose, Bld 148 (H) 70 - 99 mg/dL   BUN 14 6 - 23 mg/dL   Creatinine, Ser 1.02 0.40 - 1.50 mg/dL   GFR 72.57 >60.00 mL/min   Calcium 9.5 8.4 - 10.5 mg/dL  PSA  Result Value Ref Range   PSA 1.33 0.10 - 4.00 ng/mL  Lipid panel  Result Value Ref Range   Cholesterol 98 0 - 200 mg/dL   Triglycerides 208.0 (H) 0.0 - 149.0 mg/dL   HDL 38.40 (L) >39.00 mg/dL   VLDL 41.6 (H) 0.0 - 40.0 mg/dL   Total CHOL/HDL Ratio 3    NonHDL 59.26   LDL cholesterol, direct  Result Value Ref Range   Direct LDL 36.0 mg/dL

## 2022-08-05 ENCOUNTER — Encounter: Payer: Self-pay | Admitting: Family Medicine

## 2022-08-05 DIAGNOSIS — Z125 Encounter for screening for malignant neoplasm of prostate: Secondary | ICD-10-CM

## 2022-08-05 DIAGNOSIS — E119 Type 2 diabetes mellitus without complications: Secondary | ICD-10-CM

## 2022-08-05 LAB — LDL CHOLESTEROL, DIRECT: Direct LDL: 36 mg/dL

## 2022-08-20 ENCOUNTER — Other Ambulatory Visit: Payer: Self-pay | Admitting: Family Medicine

## 2022-08-28 MED ORDER — CANAGLIFLOZIN 100 MG PO TABS: 100.0000 mg | ORAL_TABLET | Freq: Every day | ORAL | 1 refills | Status: DC

## 2022-08-28 NOTE — Telephone Encounter (Signed)
Called patient as per his request- my lab notes from before  I am afraid your hemoglobin A1c is still above goal at 8.3%, also, your triglycerides are higher than ideal despite a good anticholesterol regimen I remember you had an allergic reaction to [facility] in the past.  This makes adding a GLP-1 drug again less ideal.  We might try adding an SGLT2 such as [place] or Invokana if you would like-these are oral medications that may also help some with weight loss.  Would this be okay with you?   Your PSA has gone up some since last year-I am not alarmed, but would like to recheck in 4 months instead of a year to make sure it trends back down. We can recheck the next time you are in to check your A1c.  Please help me to remember!     - we will start him on Invokana 100 mg daily  Scheduled a visit in June for A1c and PSA

## 2022-08-29 ENCOUNTER — Telehealth: Payer: Self-pay | Admitting: Internal Medicine

## 2022-08-29 NOTE — Telephone Encounter (Signed)
Brandon Stone Fatima Sanger has been renewed.Called pt and gave him his new ID #  CARD NO. [id number]   CARD STATUS Active   BIN [id number]   PCN [id number]   PC GROUP [id number]

## 2022-08-29 NOTE — Telephone Encounter (Signed)
Patient called stating that the [employer] is do for renewing.  He is wondering if there is anything he needs to do.

## 2022-09-04 ENCOUNTER — Other Ambulatory Visit: Payer: Self-pay | Admitting: Internal Medicine

## 2022-10-06 ENCOUNTER — Encounter: Payer: Self-pay | Admitting: *Deleted

## 2022-10-10 ENCOUNTER — Telehealth: Payer: Self-pay | Admitting: Internal Medicine

## 2022-10-10 MED ORDER — ATORVASTATIN CALCIUM 40 MG PO TABS: 40.0000 mg | ORAL_TABLET | Freq: Every day | ORAL | 0 refills | Status: DC

## 2022-10-10 NOTE — Telephone Encounter (Signed)
*  STAT* If patient is at the pharmacy, call can be transferred to refill team.   1. Which medications need to be refilled? (please list name of each medication and dose if known)   atorvastatin (LIPITOR) 40 MG tablet   2. Which pharmacy/location (including street and city if local pharmacy) is medication to be sent to?  [id number] [place], [place] - [address]   3. Do they need a 30 day or 90 day supply?   5 day  Patient stated he is completely out of this medication and wants a few tablets to hold him over until his medication comes in from [facility].

## 2022-10-10 NOTE — Telephone Encounter (Signed)
Pt's medication was sent to pt's pharmacy as requested. Confirmation received.  °

## 2022-10-13 ENCOUNTER — Other Ambulatory Visit: Payer: Self-pay | Admitting: Family Medicine

## 2022-10-13 DIAGNOSIS — E119 Type 2 diabetes mellitus without complications: Secondary | ICD-10-CM

## 2022-10-21 ENCOUNTER — Ambulatory Visit (INDEPENDENT_AMBULATORY_CARE_PROVIDER_SITE_OTHER): Payer: Medicare HMO | Admitting: *Deleted

## 2022-10-21 VITALS — BP 123/63 | HR 75 | Ht 72.0 in | Wt 222.4 lb

## 2022-10-21 DIAGNOSIS — Z0000 Encounter for general adult medical examination without abnormal findings: Secondary | ICD-10-CM | POA: Diagnosis not present

## 2022-10-21 NOTE — Patient Instructions (Signed)
Brandon Stone , Thank you for taking time to come for your Medicare Wellness Visit. I appreciate your ongoing commitment to your health goals. Please review the following plan we discussed and let me know if I can assist you in the future.   These are the goals we discussed:  Goals      Increase physical activity        This is a list of the screening recommended for you and due dates:  Health Maintenance  Topic Date Due   COVID-19 Vaccine (1) Never done   Yearly kidney health urinalysis for diabetes  Never done   Zoster (Shingles) Vaccine (1 of 2) Never done   Complete foot exam   02/13/2022   Eye exam for diabetics  01/21/2023   Flu Shot  01/22/2023   Hemoglobin A1C  02/02/2023   Yearly kidney function blood test for diabetes  08/05/2023   Medicare Annual Wellness Visit  10/21/2023   DTaP/Tdap/Td vaccine (2 - Td or Tdap) 12/20/2026   Colon Cancer Screening  02/28/2032   Pneumonia Vaccine  Completed   Hepatitis C Screening: USPSTF Recommendation to screen - Ages 7-79 yo.  Completed   HPV Vaccine  Aged Out     Next appointment: Follow up in one year for your annual wellness visit.   Preventive Care 36 Years and Older, Male Preventive care refers to lifestyle choices and visits with your health care provider that can promote health and wellness. What does preventive care include? A yearly physical exam. This is also called an annual well check. Dental exams once or twice a year. Routine eye exams. Ask your health care provider how often you should have your eyes checked. Personal lifestyle choices, including: Daily care of your teeth and gums. Regular physical activity. Eating a healthy diet. Avoiding tobacco and drug use. Limiting alcohol use. Practicing safe sex. Taking low doses of aspirin every day. Taking vitamin and mineral supplements as recommended by your health care provider. What happens during an annual well check? The services and screenings done by your  health care provider during your annual well check will depend on your age, overall health, lifestyle risk factors, and family history of disease. Counseling  Your health care provider may ask you questions about your: Alcohol use. Tobacco use. Drug use. Emotional well-being. Home and relationship well-being. Sexual activity. Eating habits. History of falls. Memory and ability to understand (cognition). Work and work Astronomer. Screening  You may have the following tests or measurements: Height, weight, and BMI. Blood pressure. Lipid and cholesterol levels. These may be checked every 5 years, or more frequently if you are over 15 years old. Skin check. Lung cancer screening. You may have this screening every year starting at age 59 if you have a 30-pack-year history of smoking and currently smoke or have quit within the past 15 years. Fecal occult blood test (FOBT) of the stool. You may have this test every year starting at age 60. Flexible sigmoidoscopy or colonoscopy. You may have a sigmoidoscopy every 5 years or a colonoscopy every 10 years starting at age 16. Prostate cancer screening. Recommendations will vary depending on your family history and other risks. Hepatitis C blood test. Hepatitis B blood test. Sexually transmitted disease (STD) testing. Diabetes screening. This is done by checking your blood sugar (glucose) after you have not eaten for a while (fasting). You may have this done every 1-3 years. Abdominal aortic aneurysm (AAA) screening. You may need this if you are a  current or former smoker. Osteoporosis. You may be screened starting at age 52 if you are at high risk. Talk with your health care provider about your test results, treatment options, and if necessary, the need for more tests. Vaccines  Your health care provider may recommend certain vaccines, such as: Influenza vaccine. This is recommended every year. Tetanus, diphtheria, and acellular pertussis  (Tdap, Td) vaccine. You may need a Td booster every 10 years. Zoster vaccine. You may need this after age 92. Pneumococcal 13-valent conjugate (PCV13) vaccine. One dose is recommended after age 36. Pneumococcal polysaccharide (PPSV23) vaccine. One dose is recommended after age 64. Talk to your health care provider about which screenings and vaccines you need and how often you need them. This information is not intended to replace advice given to you by your health care provider. Make sure you discuss any questions you have with your health care provider. Document Released: 07/06/2015 Document Revised: 02/27/2016 Document Reviewed: 04/10/2015 Elsevier Interactive Patient Education  2017 [place] Prevention in the Home Falls can cause injuries. They can happen to people of all ages. There are many things you can do to make your home safe and to help prevent falls. What can I do on the outside of my home? Regularly fix the edges of walkways and driveways and fix any cracks. Remove anything that might make you trip as you walk through a door, such as a raised step or threshold. Trim any bushes or trees on the path to your home. Use bright outdoor lighting. Clear any walking paths of anything that might make someone trip, such as rocks or tools. Regularly check to see if handrails are loose or broken. Make sure that both sides of any steps have handrails. Any raised decks and porches should have guardrails on the edges. Have any leaves, snow, or ice cleared regularly. Use sand or salt on walking paths during winter. Clean up any spills in your garage right away. This includes oil or grease spills. What can I do in the bathroom? Use night lights. Install grab bars by the toilet and in the tub and shower. Do not use towel bars as grab bars. Use non-skid mats or decals in the tub or shower. If you need to sit down in the shower, use a plastic, non-slip stool. Keep the floor dry. Clean  up any water that spills on the floor as soon as it happens. Remove soap buildup in the tub or shower regularly. Attach bath mats securely with double-sided non-slip rug tape. Do not have throw rugs and other things on the floor that can make you trip. What can I do in the bedroom? Use night lights. Make sure that you have a light by your bed that is easy to reach. Do not use any sheets or blankets that are too big for your bed. They should not hang down onto the floor. Have a firm chair that has side arms. You can use this for support while you get dressed. Do not have throw rugs and other things on the floor that can make you trip. What can I do in the kitchen? Clean up any spills right away. Avoid walking on wet floors. Keep items that you use a lot in easy-to-reach places. If you need to reach something above you, use a strong step stool that has a grab bar. Keep electrical cords out of the way. Do not use floor polish or wax that makes floors slippery. If you must use  wax, use non-skid floor wax. Do not have throw rugs and other things on the floor that can make you trip. What can I do with my stairs? Do not leave any items on the stairs. Make sure that there are handrails on both sides of the stairs and use them. Fix handrails that are broken or loose. Make sure that handrails are as long as the stairways. Check any carpeting to make sure that it is firmly attached to the stairs. Fix any carpet that is loose or worn. Avoid having throw rugs at the top or bottom of the stairs. If you do have throw rugs, attach them to the floor with carpet tape. Make sure that you have a light switch at the top of the stairs and the bottom of the stairs. If you do not have them, ask someone to add them for you. What else can I do to help prevent falls? Wear shoes that: Do not have high heels. Have rubber bottoms. Are comfortable and fit you well. Are closed at the toe. Do not wear sandals. If you  use a stepladder: Make sure that it is fully opened. Do not climb a closed stepladder. Make sure that both sides of the stepladder are locked into place. Ask someone to hold it for you, if possible. Clearly mark and make sure that you can see: Any grab bars or handrails. First and last steps. Where the edge of each step is. Use tools that help you move around (mobility aids) if they are needed. These include: Canes. Walkers. Scooters. Crutches. Turn on the lights when you go into a dark area. Replace any light bulbs as soon as they burn out. Set up your furniture so you have a clear path. Avoid moving your furniture around. If any of your floors are uneven, fix them. If there are any pets around you, be aware of where they are. Review your medicines with your doctor. Some medicines can make you feel dizzy. This can increase your chance of falling. Ask your doctor what other things that you can do to help prevent falls. This information is not intended to replace advice given to you by your health care provider. Make sure you discuss any questions you have with your health care provider. Document Released: 04/05/2009 Document Revised: 11/15/2015 Document Reviewed: 07/14/2014 Elsevier Interactive Patient Education  2017 [employer].

## 2022-10-21 NOTE — Progress Notes (Signed)
Subjective:   Brandon Stone is a 77 y.o. male who presents for Medicare Annual/Subsequent preventive examination.  Review of Systems     Cardiac Risk Factors include: advanced age (>69men, >41 women);obesity (BMI >30kg/m2);hypertension;male gender;dyslipidemia;diabetes mellitus     Objective:    Today's Vitals   10/21/22 1018  BP: 123/63  Pulse: 75  Weight: 222 lb 6.4 oz (100.9 kg)  Height: 6' (1.829 m)   Body mass index is 30.16 kg/m.     10/21/2022   10:19 AM 06/02/2022   11:22 AM 12/02/2021   11:09 AM 10/17/2021    1:14 PM 10/07/2021   10:13 AM 10/11/2020   10:25 AM 06/06/2020   10:57 AM  Advanced Directives  Does Patient Have a Medical Advance Directive? Yes Yes Yes Yes Yes No No  Type of Advance Directive Living will Living will;Healthcare Power of [employer] Power of [place];Living will Living will Living will;Healthcare Power of Attorney    Does patient want to make changes to medical advance directive?  No - Patient declined   No - Patient declined    Copy of Healthcare Power of Attorney in Chart?  No - copy requested  No - copy requested Yes - validated most recent copy scanned in chart (See row information)    Would patient like information on creating a medical advance directive?      No - Patient declined No - Patient declined    Current Medications (verified) Outpatient Encounter Medications as of 10/21/2022  Medication Sig   acetaminophen (TYLENOL) 325 MG tablet Take 325 mg by mouth every 6 (six) hours as needed for mild pain.   allopurinol (ZYLOPRIM) 300 MG tablet TAKE 1 TABLET EVERY DAY   amLODipine (NORVASC) 10 MG tablet Take 1 tablet (10 mg total) by mouth daily.   aspirin EC 81 MG tablet Take 81 mg by mouth daily.   atorvastatin (LIPITOR) 40 MG tablet Take 1 tablet (40 mg total) by mouth daily.   canagliflozin (INVOKANA) 100 MG TABS tablet Take 1 tablet (100 mg total) by mouth daily before breakfast.   Evolocumab (REPATHA SURECLICK) 140 MG/ML  SOAJ INJECT 1 PEN INTO THE SKIN EVERY 14 (FOURTEEN) DAYS.   fluticasone-salmeterol (ADVAIR) 100-50 MCG/ACT AEPB Inhale 1 puff into the lungs 2 (two) times daily.   glipiZIDE (GLUCOTROL XL) 10 MG 24 hr tablet TAKE 1 TABLET EVERY DAY WITH BREAKFAST   glipiZIDE (GLUCOTROL XL) 5 MG 24 hr tablet TAKE 1 TABLET DAILY WITH BREAKFAST. TAKE WITH 10 MG TABLET FOR 15 MG TOTAL   glucose blood (TRUE METRIX BLOOD GLUCOSE TEST) test strip TEST AS DIRECTED DAILY   icosapent Ethyl (VASCEPA) 1 g capsule TAKE 2 CAPSULES BY MOUTH 2 TIMES DAILY.   isosorbide mononitrate (IMDUR) 60 MG 24 hr tablet TAKE 1 TABLET EVERY DAY   loratadine (CLARITIN) 10 MG tablet Take 10 mg by mouth daily as needed for allergies.   metFORMIN (GLUCOPHAGE) 500 MG tablet TAKE 1 TABLET TWICE DAILY WITH MEALS   metoprolol succinate (TOPROL-XL) 25 MG 24 hr tablet TAKE 1 TABLET IN THE MORNING AND AT BEDTIME   MULTIPLE VITAMIN-FOLIC ACID PO Take 1 tablet by mouth daily. Contains 400 mg folic acid   nitroGLYCERIN (NITROSTAT) 0.4 MG SL tablet Place 1 tablet (0.4 mg total) under the tongue every 5 (five) minutes as needed for chest pain.   omeprazole (PRILOSEC) 20 MG capsule TAKE 1 CAPSULE TWICE DAILY   potassium chloride SA (KLOR-CON M) 20 MEQ tablet TAKE 1 TABLET  EVERY DAY   triamcinolone cream (KENALOG) 0.1 % Apply 1 application topically 2 (two) times daily. Use as needed for dry or itchy skin (Patient taking differently: Apply 1 application  topically daily. Use as needed for dry or itchy skin)   TRUEplus Lancets 33G MISC USE AS DIRECTED ONCE A DAY.   valsartan-hydrochlorothiazide (DIOVAN-HCT) 160-25 MG tablet TAKE 1 TABLET EVERY DAY   No facility-administered encounter medications on file as of 10/21/2022.    Allergies (verified) Black pepper [piper], Codeine, Other, Rosuvastatin, Tape, and Ticlopidine hcl   History: Past Medical History:  Diagnosis Date   Anginal pain (HCC)    Arthritis    Barrett's esophagus    BPH (benign prostatic  hypertrophy)    CAD (coronary artery disease)     a. CABG 1991;  b.  IWMI  1998;  c.  S/P PTCA  stents to SVG ot OM last in 2003;  d.  Redo CABG x 2 in 2004 (VG->OM->LCX, LRA->PDA);  e. 12/2011 Cath: 3vd, sev LM into LCX dzs, VG->OM 100, LIMA->DIAG->LAD patent, Rad Art->PDA patent, EF 45%, Med Rx.   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)    Myocardial infarction (HCC)    at age 65   Psoriasis    Rheumatic fever    as a child   S/P coronary artery stent placement 1998   TIA (transient ischemic attack)    FOUND ON MRI  PER PATIENT-     NO PROBLEM   Past Surgical History:  Procedure Laterality Date   APPENDECTOMY     CHOLECYSTECTOMY N/A 09/18/2016   Procedure: LAPAROSCOPIC CHOLECYSTECTOMY WITH INTRAOPERATIVE CHOLANGIOGRAM;  Surgeon: Avel Peace, MD;  Location: [facility] OR;  Service: General;  Laterality: N/A;   COLONOSCOPY  07/2013   COLONOSCOPY WITH PROPOFOL  07/21/2016   Dr.Armbruster   CORONARY ANGIOPLASTY     STENTS   CORONARY ARTERY BYPASS GRAFT     1991 (LIMA to LAD/Diag;  SVG to RCA;  SVG to OM)   CORONARY ARTERY BYPASS GRAFT     2004 (L radial to PDA; SVG to OM1/ distal LCx)   POLYPECTOMY     TONSILLECTOMY AND ADENOIDECTOMY     UMBILICAL HERNIA REPAIR     Family History  Problem Relation Age of Onset   Heart disease Mother    Hypertension Mother    Hyperlipidemia Mother    Diabetes Mother    Skin cancer Mother        skin   Heart disease Father    Hypertension Father    Hyperlipidemia Father    Diabetes Father    Stroke Sister    Stroke Brother    Colon cancer Neg Hx    Esophageal cancer Neg Hx    Stomach cancer Neg Hx    Rectal cancer Neg Hx    Social History   Socioeconomic History   Marital status: Married    Spouse name: Not  on file   Number of children: 2   Years of education: Not on file   Highest education level: Not on file  Occupational History   Occupation: retired  Tobacco Use   Smoking status: Former    Packs/day: 0.50  Years: 8.00    Additional pack years: 0.00    Total pack years: 4.00    Types: Cigarettes    Quit date: 06/24/1971    Years since quitting: 51.3   Smokeless tobacco: Never  Vaping Use   Vaping Use: Never used  Substance and Sexual Activity   Alcohol use: No   Drug use: No   Sexual activity: Yes  Other Topics Concern   Not on file  Social History Narrative   Not on file   Social Determinants of Health   Financial Resource Strain: Low Risk  (10/17/2021)   Overall Financial Resource Strain (CARDIA)    Difficulty of Paying Living Expenses: Not hard at all  Food Insecurity: No Food Insecurity (10/21/2022)   Hunger Vital Sign    Worried About Running Out of Food in the Last Year: Never true    Ran Out of Food in the Last Year: Never true  Transportation Needs: No Transportation Needs (10/21/2022)   PRAPARE - Administrator, Civil Service (Medical): No    Lack of Transportation (Non-Medical): No  Physical Activity: Sufficiently Active (10/17/2021)   Exercise Vital Sign    Days of Exercise per Week: 7 days    Minutes of Exercise per Session: 60 min  Stress: No Stress Concern Present (10/17/2021)   [employer] of Occupational Health - Occupational Stress Questionnaire    Feeling of Stress : Not at all  Social Connections: Socially Integrated (10/17/2021)   Social Connection and Isolation Panel [NHANES]    Frequency of Communication with Friends and Family: Three times a week    Frequency of Social Gatherings with Friends and Family: More than three times a week    Attends Religious Services: More than 4 times per year    Active Member of [employer] or Organizations: Yes    Attends Engineer, structural: More than 4 times per year    Marital Status:  Married    Tobacco Counseling Counseling given: Not Answered   Clinical Intake:  Pre-visit preparation completed: Yes  Pain : No/denies pain  BMI - recorded: 30.16 Nutritional Status: BMI > 30  Obese Nutritional Risks: None Diabetes: Yes CBG done?: No Did pt. bring in CBG monitor from home?: No  How often do you need to have someone help you when you read instructions, pamphlets, or other written materials from your doctor or pharmacy?: 1 - Never   Activities of Daily Living    10/21/2022   10:22 AM  In your present state of health, do you have any difficulty performing the following activities:  Hearing? 0  Comment has some ringing in ears  Vision? 1  Comment has cataracts  Difficulty concentrating or making decisions? 1  Comment slight memory loss  Walking or climbing stairs? 0  Dressing or bathing? 0  Doing errands, shopping? 0  Preparing Food and eating ? N  Using the Toilet? N  In the past six months, have you accidently leaked urine? Y  Comment "only once"  Do you have problems with loss of bowel control? N  Managing your Medications? N  Managing your Finances? N  Housekeeping or managing your Housekeeping? N    Patient Care Team: Copland, Gwenlyn Found, MD as PCP - General (Family Medicine) Pricilla Riffle, MD as PCP - Cardiology (Cardiology) Magrinat, Valentino Hue, MD (Inactive) as Consulting Physician (Oncology)  Indicate any recent Medical Services you may have received from other than [facility] providers in the past year (date  may be approximate).     Assessment:   This is a routine wellness examination for Acxel.  Hearing/Vision screen No results found.  Dietary issues and exercise activities discussed: Current Exercise Habits: Home exercise routine, Type of exercise: walking, Time (Minutes): 50, Frequency (Times/Week): 4, Weekly Exercise (Minutes/Week): 200, Intensity: Mild, Exercise limited by: None identified   Goals Addressed   None    Depression  Screen    10/21/2022   10:22 AM 08/04/2022   10:02 AM 03/25/2022    2:32 PM 10/17/2021    1:15 PM 10/11/2020   10:29 AM 10/03/2019   10:35 AM 09/30/2018   10:28 AM  PHQ 2/9 Scores  PHQ - 2 Score 0 0 0 0 0 0 0    Fall Risk    10/21/2022   10:20 AM 08/04/2022   10:02 AM 03/25/2022    2:26 PM 10/17/2021    1:15 PM 10/11/2020   10:28 AM  Fall Risk   Falls in the past year? 0 0 0 0 0  Number falls in past yr: 0 0 0 0 0  Injury with Fall? 0 0 0 0 0  Risk for fall due to : No Fall Risks No Fall Risks No Fall Risks No Fall Risks   Follow up Falls evaluation completed Falls evaluation completed Falls evaluation completed Falls evaluation completed Falls prevention discussed    FALL RISK PREVENTION PERTAINING TO THE HOME:  Any stairs in or around the home? Yes  If so, are there any without handrails? No  Home free of loose throw rugs in walkways, pet beds, electrical cords, etc? Yes  Adequate lighting in your home to reduce risk of falls? Yes   ASSISTIVE DEVICES UTILIZED TO PREVENT FALLS:  Life alert? No  Use of a cane, walker or w/c? No  Grab bars in the bathroom? No  Shower chair or bench in shower? No  Elevated toilet seat or a handicapped toilet? No   TIMED UP AND GO:  Was the test performed? Yes .  Length of time to ambulate 10 feet: 6 sec.   Gait steady and fast without use of assistive device  Cognitive Function:    09/28/2017   10:33 AM  MMSE - Mini Mental State Exam  Orientation to time 5  Orientation to Place 5  Registration 3  Attention/ Calculation 5  Recall 3  Language- name 2 objects 2  Language- repeat 1  Language- follow 3 step command 3  Language- read & follow direction 1  Write a sentence 1        10/21/2022   10:34 AM 10/17/2021    1:20 PM  6CIT Screen  What Year? 0 points 0 points  What month? 0 points 0 points  What time? 0 points 0 points  Count back from 20 0 points 0 points  Months in reverse 0 points 0 points  Repeat phrase 0 points 0  points  Total Score 0 points 0 points    Immunizations Immunization History  Administered Date(s) Administered   Influenza, High Dose Seasonal PF 05/07/2015, 05/06/2016, 05/11/2017, 04/05/2018   Pneumococcal Conjugate-13 05/11/2017   Pneumococcal Polysaccharide-23 12/19/2014   Tdap 12/19/2016    TDAP status: Up to date  Flu Vaccine status: Up to date  Pneumococcal vaccine status: Up to date  Covid-19 vaccine status: Information provided on how to obtain vaccines.   Qualifies for Shingles Vaccine? Yes   Zostavax completed No   Shingrix Completed?: No.    Education has  been provided regarding the importance of this vaccine. Patient has been advised to call insurance company to determine out of pocket expense if they have not yet received this vaccine. Advised may also receive vaccine at local pharmacy or Health Dept. Verbalized acceptance and understanding.  Screening Tests Health Maintenance  Topic Date Due   COVID-19 Vaccine (1) Never done   Diabetic kidney evaluation - Urine ACR  Never done   Zoster Vaccines- Shingrix (1 of 2) Never done   FOOT EXAM  02/13/2022   Medicare Annual Wellness (AWV)  10/18/2022   OPHTHALMOLOGY EXAM  01/21/2023   INFLUENZA VACCINE  01/22/2023   HEMOGLOBIN A1C  02/02/2023   Diabetic kidney evaluation - eGFR measurement  08/05/2023   DTaP/Tdap/Td (2 - Td or Tdap) 12/20/2026   COLONOSCOPY (Pts 45-52yrs Insurance coverage will need to be confirmed)  02/28/2032   Pneumonia Vaccine 70+ Years old  Completed   Hepatitis C Screening  Completed   HPV VACCINES  Aged Out    Health Maintenance  Health Maintenance Due  Topic Date Due   COVID-19 Vaccine (1) Never done   Diabetic kidney evaluation - Urine ACR  Never done   Zoster Vaccines- Shingrix (1 of 2) Never done   FOOT EXAM  02/13/2022   Medicare Annual Wellness (AWV)  10/18/2022    Colorectal cancer screening: Type of screening: Colonoscopy. Completed 02/27/22. Repeat every 10 years  Lung  Cancer Screening: (Low Dose CT Chest recommended if Age 25-80 years, 30 pack-year currently smoking OR have quit w/in 15years.) does not qualify.   Additional Screening:  Hepatitis C Screening: does qualify; Completed 05/11/17  Vision Screening: Recommended annual ophthalmology exams for early detection of glaucoma and other disorders of the eye. Is the patient up to date with their annual eye exam?  Yes  Who is the provider or what is the name of the office in which the patient attends annual eye exams? [facility] If pt is not established with a provider, would they like to be referred to a provider to establish care? No .   Dental Screening: Recommended annual dental exams for proper oral hygiene  Community Resource Referral / Chronic Care Management: CRR required this visit?  No   CCM required this visit?  No      Plan:     I have personally reviewed and noted the following in the patient's chart:   Medical and social history Use of alcohol, tobacco or illicit drugs  Current medications and supplements including opioid prescriptions. Patient is not currently taking opioid prescriptions. Functional ability and status Nutritional status Physical activity Advanced directives List of other physicians Hospitalizations, surgeries, and ER visits in previous 12 months Vitals Screenings to include cognitive, depression, and falls Referrals and appointments  In addition, I have reviewed and discussed with patient certain preventive protocols, quality metrics, and best practice recommendations. A written personalized care plan for preventive services as well as general preventive health recommendations were provided to patient.     Donne Anon, [place]   10/21/2022   Nurse Notes: None

## 2022-10-27 ENCOUNTER — Other Ambulatory Visit: Payer: Self-pay | Admitting: Internal Medicine

## 2022-10-28 ENCOUNTER — Other Ambulatory Visit: Payer: Self-pay | Admitting: Family Medicine

## 2022-10-28 DIAGNOSIS — E876 Hypokalemia: Secondary | ICD-10-CM

## 2022-10-28 DIAGNOSIS — Z8739 Personal history of other diseases of the musculoskeletal system and connective tissue: Secondary | ICD-10-CM

## 2022-11-02 DIAGNOSIS — H6692 Otitis media, unspecified, left ear: Secondary | ICD-10-CM | POA: Diagnosis not present

## 2022-11-16 ENCOUNTER — Other Ambulatory Visit: Payer: Self-pay | Admitting: Internal Medicine

## 2022-11-18 ENCOUNTER — Other Ambulatory Visit: Payer: Self-pay | Admitting: Family Medicine

## 2022-11-18 DIAGNOSIS — K219 Gastro-esophageal reflux disease without esophagitis: Secondary | ICD-10-CM

## 2022-11-24 ENCOUNTER — Encounter: Payer: Self-pay | Admitting: Family Medicine

## 2022-11-24 DIAGNOSIS — E119 Type 2 diabetes mellitus without complications: Secondary | ICD-10-CM

## 2022-11-24 NOTE — Telephone Encounter (Signed)
Called pt back-  Invokana is too expensive for him to use this time- the price of his rx went up a whole lot at last refill.  ?Donut hole He will ask pharmacy the reason for cost increase, and will come in for an A1c so we can determine how much the SGLT2 is helping him   Lab Results  Component Value Date   HGBA1C 8.3 (H) 08/04/2022

## 2022-11-25 ENCOUNTER — Encounter: Payer: Self-pay | Admitting: Family Medicine

## 2022-11-25 ENCOUNTER — Other Ambulatory Visit (INDEPENDENT_AMBULATORY_CARE_PROVIDER_SITE_OTHER): Payer: Medicare HMO

## 2022-11-25 DIAGNOSIS — E119 Type 2 diabetes mellitus without complications: Secondary | ICD-10-CM

## 2022-11-25 DIAGNOSIS — Z7984 Long term (current) use of oral hypoglycemic drugs: Secondary | ICD-10-CM | POA: Diagnosis not present

## 2022-11-25 LAB — HEMOGLOBIN A1C: Hgb A1c MFr Bld: 8 % — ABNORMAL HIGH (ref 4.6–6.5)

## 2022-11-25 LAB — BASIC METABOLIC PANEL
BUN: 23 mg/dL (ref 6–23)
CO2: 27 mEq/L (ref 19–32)
Calcium: 9.8 mg/dL (ref 8.4–10.5)
Chloride: 103 mEq/L (ref 96–112)
Creatinine, Ser: 1.11 mg/dL (ref 0.40–1.50)
GFR: 65.42 mL/min (ref >60.00)
Glucose, Bld: 148 mg/dL — ABNORMAL HIGH (ref 70–99)
Potassium: 3.8 mEq/L (ref 3.5–5.1)
Sodium: 139 mEq/L (ref 135–145)

## 2022-11-25 LAB — CBC
HCT: 45.9 % (ref 39.0–52.0)
Hemoglobin: 14.7 g/dL (ref 13.0–17.0)
MCHC: 32 g/dL (ref 30.0–36.0)
MCV: 89.5 fl (ref 78.0–100.0)
Platelets: 240 10*3/uL (ref 150.0–400.0)
RBC: 5.13 Mil/uL (ref 4.22–5.81)
RDW: 14 % (ref 11.5–15.5)
WBC: 12.6 10*3/uL — ABNORMAL HIGH (ref 4.0–10.5)

## 2022-11-27 ENCOUNTER — Other Ambulatory Visit: Payer: Self-pay | Admitting: *Deleted

## 2022-11-27 ENCOUNTER — Ambulatory Visit: Payer: Medicare HMO

## 2022-11-27 DIAGNOSIS — R972 Elevated prostate specific antigen [PSA]: Secondary | ICD-10-CM | POA: Diagnosis not present

## 2022-11-28 ENCOUNTER — Encounter: Payer: Self-pay | Admitting: Family Medicine

## 2022-11-28 LAB — PSA: PSA: 0.77 ng/mL (ref <=4.00)

## 2022-12-05 ENCOUNTER — Other Ambulatory Visit: Payer: Self-pay | Admitting: Family Medicine

## 2022-12-06 NOTE — Progress Notes (Signed)
Healthcare at [facility] [address] [place], [place] [address] [phone]  Date:  12/15/2022   Name:  Brandon Stone   DOB:  [birth year]   MRN:  [id number]  PCP:  Pearline Cables, MD    Chief Complaint: lab repeat (Recheck PSA and A1C- last collected 11/28/22/Concerns/ questions: 1. Pt says he has some toe pain on the L foot, wonders if he has neuropathy. Topical neuropathy cream helps. 2. Concerns with Enlarged Prostate. Nocturia/Urine MA due)   History of Present Illness:  Brandon Stone is a 77 y.o. very pleasant male patient who presents with the following:  Patient seen today for follow-up, A1c Most recent visit with myself was in February  History of diabetes, CLL, hyperlipidemia, hypertension, CAD status post CABG 2004 and 921, subdural hematoma March 2020 He is seeing Dr Tenny Craw in 2 days for cardiology recheck   He had a bump in his PSA earlier this year, rechecked earlier this month and had come back down to baseline  Lab Results  Component Value Date   PSA1 0.9 06/01/2020   PSA 0.77 11/27/2022   PSA 1.33 08/04/2022   PSA 0.74 10/07/2021   He has noted some pain in his left toes for a while now- the 3rd and 4th toe.  They bother him day and night- pressure from socks is uncomfortable He tries to wear loose footwear which does help  It feels "like a sharp pain like someone just stepped on my toe" This does not seem like gout. It is not yet bad enough that he wants to start on medication for this   He is noting more nocturia every 2.5- 3 hours.  He does a little better if he fluid restricts It can be harder to pass his urine stream  He also recently did routine blood work in anticipation of visit today-        Results for orders placed or performed in visit on 11/25/22  Basic metabolic panel  Result Value Ref Range    Sodium 139 135 - 145 mEq/L    Potassium 3.8 3.5 - 5.1 mEq/L    Chloride 103 96 - 112 mEq/L     CO2 27 19 - 32 mEq/L    Glucose, Bld 148 (H) 70 - 99 mg/dL    BUN 23 6 - 23 mg/dL    Creatinine, Ser 4.40 0.40 - 1.50 mg/dL    GFR 10.27 >25.36 mL/min    Calcium 9.8 8.4 - 10.5 mg/dL  CBC  Result Value Ref Range    WBC 12.6 (H) 4.0 - 10.5 K/uL    RBC 5.13 4.22 - 5.81 Mil/uL    Platelets 240.0 150.0 - 400.0 K/uL    Hemoglobin 14.7 13.0 - 17.0 g/dL    HCT 64.4 03.4 - 74.2 %    MCV 89.5 78.0 - 100.0 fl    MCHC 32.0 30.0 - 36.0 g/dL    RDW 59.5 63.8 - 75.6 %  Hemoglobin A1c  Result Value Ref Range    Hgb A1c MFr Bld 8.0 (H) 4.6 - 6.5 %   A1c has trended down from 8.3 in February to 8.0; there also was a concern about cost of Invokana.  We thought he might be in the donut hole He had to stop Invokana unfortunately   Allopurinol 300 Amlodipine 10 Aspirin 81 Lipitor 40 Invokana 100- too expensive, had to stop using  Glipizide ER 10 mg  Metformin 500 twice daily Toprol-XL 25 Imdur Vascepa Potassium 20 mill equivalents daily Valsartan HCTZ Repatha  Urine microalbumin Foot exam due Shingrix Colonoscopy up-to-date Patient Active Problem List   Diagnosis Date Noted   Capsulitis of left shoulder 03/31/2022   AAA (abdominal aortic aneurysm) (HCC) 10/31/2019   Subdural hematoma (HCC) 09/08/2018   Diabetes mellitus without complication (HCC) 01/31/2018   Symptomatic cholelithiasis 09/18/2016   Chronic lymphocytic leukemia (CLL), B-cell (HCC) 05/13/2014   Stable angina 01/26/2012   Hyperlipidemia 06/21/2008   HYPERTENSION, BENIGN 06/21/2008   CAD, NATIVE VESSEL 06/21/2008    Past Medical History:  Diagnosis Date   Anginal pain (HCC)    Arthritis    Barrett's esophagus    BPH (benign prostatic hypertrophy)    CAD (coronary artery disease)     a. CABG 1991;  b.  IWMI  1998;  c.  S/P PTCA  stents to SVG ot OM last in 2003;  d.  Redo CABG x 2 in 2004 (VG->OM->LCX, LRA->PDA);  e. 12/2011 Cath: 3vd, sev LM into LCX dzs, VG->OM 100, LIMA->DIAG->LAD patent, Rad Art->PDA patent, EF  45%, Med Rx.   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)    Myocardial infarction (HCC)    at age 54   Psoriasis    Rheumatic fever    as a child   S/P coronary artery stent placement 1998   TIA (transient ischemic attack)    FOUND ON MRI  PER PATIENT-     NO PROBLEM    Past Surgical History:  Procedure Laterality Date   APPENDECTOMY     CHOLECYSTECTOMY N/A 09/18/2016   Procedure: LAPAROSCOPIC CHOLECYSTECTOMY WITH INTRAOPERATIVE CHOLANGIOGRAM;  Surgeon: Avel Peace, MD;  Location: [facility] OR;  Service: General;  Laterality: N/A;   COLONOSCOPY  07/2013   COLONOSCOPY WITH PROPOFOL  07/21/2016   Dr.Armbruster   CORONARY ANGIOPLASTY     STENTS   CORONARY ARTERY BYPASS GRAFT     1991 (LIMA to LAD/Diag;  SVG to RCA;  SVG to OM)   CORONARY ARTERY BYPASS GRAFT     2004 (L radial to PDA; SVG to OM1/ distal LCx)   POLYPECTOMY     TONSILLECTOMY AND ADENOIDECTOMY     UMBILICAL HERNIA REPAIR      Social History   Tobacco Use   Smoking status: Former    Packs/day: 0.50    Years: 8.00    Additional pack years: 0.00    Total pack years: 4.00    Types: Cigarettes    Quit date: 06/24/1971    Years since quitting: 51.5   Smokeless tobacco: Never  Vaping Use   Vaping Use: Never used  Substance Use Topics   Alcohol use: No   Drug use: No    Family History  Problem Relation Age of Onset   Heart disease Mother    Hypertension Mother    Hyperlipidemia Mother    Diabetes Mother    Skin cancer Mother        skin   Heart disease Father    Hypertension Father    Hyperlipidemia Father    Diabetes Father    Stroke Sister    Stroke Brother    Colon  cancer Neg Hx    Esophageal cancer Neg Hx    Stomach cancer Neg Hx    Rectal cancer Neg Hx     Allergies   Allergen Reactions   Black Pepper [Piper] Palpitations   Codeine Palpitations and Other (See Comments)    Wild dreams, palpitations   Other Hives and Rash    opsite and tegaderm tape.   Rosuvastatin Nausea Only   Tape Rash    Blisters from Tegaderm and tape with orange backing(opsite?)   Ticlopidine Hcl Hives and Itching    Medication list has been reviewed and updated.  Current Outpatient Medications on File Prior to Visit  Medication Sig Dispense Refill   acetaminophen (TYLENOL) 325 MG tablet Take 325 mg by mouth every 6 (six) hours as needed for mild pain.     allopurinol (ZYLOPRIM) 300 MG tablet Take 1 tablet (300 mg total) by mouth daily. 90 tablet 1   amLODipine (NORVASC) 10 MG tablet Take 1 tablet (10 mg total) by mouth daily. 90 tablet 3   aspirin EC 81 MG tablet Take 81 mg by mouth daily.     atorvastatin (LIPITOR) 40 MG tablet Take 1 tablet (40 mg total) by mouth daily. 15 tablet 0   Evolocumab (REPATHA SURECLICK) 140 MG/ML SOAJ INJECT 1 PEN INTO THE SKIN EVERY 14 (FOURTEEN) DAYS. 6 mL 3   glipiZIDE (GLUCOTROL XL) 10 MG 24 hr tablet TAKE 1 TABLET EVERY DAY WITH BREAKFAST 90 tablet 3   glipiZIDE (GLUCOTROL XL) 5 MG 24 hr tablet TAKE 1 TABLET DAILY WITH BREAKFAST. TAKE WITH 10 MG TABLET FOR 15 MG TOTAL 90 tablet 3   icosapent Ethyl (VASCEPA) 1 g capsule TAKE 2 CAPSULES BY MOUTH 2 TIMES DAILY. 360 capsule 1   isosorbide mononitrate (IMDUR) 60 MG 24 hr tablet TAKE 1 TABLET EVERY DAY 90 tablet 1   loratadine (CLARITIN) 10 MG tablet Take 10 mg by mouth daily as needed for allergies.     metFORMIN (GLUCOPHAGE) 500 MG tablet TAKE 1 TABLET TWICE DAILY WITH MEALS 180 tablet 3   metoprolol succinate (TOPROL-XL) 25 MG 24 hr tablet TAKE 1 TABLET IN THE MORNING AND AT BEDTIME 180 tablet 2   MULTIPLE VITAMIN-FOLIC ACID PO Take 1 tablet by mouth daily. Contains 400 mg folic acid     nitroGLYCERIN (NITROSTAT) 0.4 MG SL tablet Place 1 tablet (0.4 mg total) under the tongue every 5 (five)  minutes as needed for chest pain. 25 tablet 3   omeprazole (PRILOSEC) 20 MG capsule TAKE 1 CAPSULE TWICE DAILY 180 capsule 3   potassium chloride SA (KLOR-CON M) 20 MEQ tablet Take 1 tablet (20 mEq total) by mouth daily. 90 tablet 1   triamcinolone cream (KENALOG) 0.1 % Apply 1 application topically 2 (two) times daily. Use as needed for dry or itchy skin (Patient taking differently: Apply 1 application  topically daily. Use as needed for dry or itchy skin) 30 g 2   TRUE METRIX BLOOD GLUCOSE TEST test strip TEST AS DIRECTED DAILY 100 strip 3   TRUEplus Lancets 33G MISC USE AS DIRECTED ONCE A DAY. 100 each 3   valsartan-hydrochlorothiazide (DIOVAN-HCT) 160-25 MG tablet TAKE 1 TABLET EVERY DAY 90 tablet 3   No current facility-administered medications on file prior to visit.    Review of Systems:  As per HPI- otherwise negative.   Physical Examination: Vitals:   12/15/22 0927  BP: 122/72  Pulse: 72  Resp: 18  Temp: 97.7 F (36.5 C)  SpO2:  97%   Vitals:   12/15/22 0927  Weight: 222 lb 3.2 oz (100.8 kg)  Height: 6' (1.829 m)   Body mass index is 30.14 kg/m. Ideal Body Weight: Weight in (lb) to have BMI = 25: 183.9  GEN: no acute distress.  Central obesity, looks well HEENT: Atraumatic, Normocephalic.  Ears and Nose: No external deformity. CV: RRR, No M/G/R. No JVD. No thrill. No extra heart sounds. PULM: CTA B, no wheezes, crackles, rhonchi. No retractions. No resp. distress. No accessory muscle use. ABD: S, NT, ND, +BS. No rebound. No HSM. EXTR: No c/c/e PSYCH: Normally interactive. Conversant.  Foot exam: Feet generally in good repair, some mild callus formation.  Good pulses.  He is not able to sense the monofilament on the third toe of either foot.  Otherwise monofilament sensation is normal He does have likely arthritis changes in the joints of toes bilaterally  Assessment and Plan: Lower urinary tract symptoms (LUTS) - Plan: tamsulosin (FLOMAX) 0.4 MG CAPS  capsule  Diabetes mellitus without complication (HCC) - Plan: Microalbumin / creatinine urine ratio  Hyperlipidemia, unspecified hyperlipidemia type  HYPERTENSION, BENIGN  Essential hypertension Patient seen today for follow-up Blood pressure well-controlled on current regimen-continue amlodipine, Toprol-XL, valsartan HCTZ He is noting lower urinary tract symptoms from prostate enlargement.  Will have him start on Flomax 0.4 mg.  He notes he did try Avodart a few years ago and did not like it.  I asked him to let me know how he does on Flomax 0.4, we can increase to 0.8 if well-tolerated  A1c was checked recently, he did show some improvement though not quite at goal: For your A1c- going in the right direction but I would still like to get you under 8% Let's increase your metformin to 1000 twice daily (double one dose, then the other dose a week or so later) Let me know how you tolerate and I can call the in 1000 mg strength for you if you do well on the higher dose  Voltaren gel may be helpful for arthritis pain in your toes!    Will plan to recheck in about 4 months to look at his A1c  Signed Abbe Amsterdam, MD

## 2022-12-06 NOTE — Patient Instructions (Addendum)
It was great to see again today, please see me about 6 months assuming all is well Recommend getting the shingles vaccine series at your pharmacy at your convenience For prostate symptoms (having to get up to pee at night)- let's try flomax 0.4 mg once daily.  Let me know what you think- we can go to 2 pills if needed For your A1c- going in the right direction but I would still like to get you under 8% Let's increase your metformin to 1000 twice daily (double one dose, then the other dose a week or so later) Let me know how you tolerate and I can call the in 1000 mg strength for you if you do well on the higher dose  Voltaren gel may be helpful for arthritis pain in your toes!    Take care

## 2022-12-15 ENCOUNTER — Ambulatory Visit (INDEPENDENT_AMBULATORY_CARE_PROVIDER_SITE_OTHER): Payer: Medicare HMO | Admitting: Family Medicine

## 2022-12-15 ENCOUNTER — Encounter: Payer: Self-pay | Admitting: Family Medicine

## 2022-12-15 VITALS — BP 122/72 | HR 72 | Temp 97.7°F | Resp 18 | Ht 72.0 in | Wt 222.2 lb

## 2022-12-15 DIAGNOSIS — Z7984 Long term (current) use of oral hypoglycemic drugs: Secondary | ICD-10-CM

## 2022-12-15 DIAGNOSIS — R972 Elevated prostate specific antigen [PSA]: Secondary | ICD-10-CM

## 2022-12-15 DIAGNOSIS — E785 Hyperlipidemia, unspecified: Secondary | ICD-10-CM

## 2022-12-15 DIAGNOSIS — I10 Essential (primary) hypertension: Secondary | ICD-10-CM

## 2022-12-15 DIAGNOSIS — R399 Unspecified symptoms and signs involving the genitourinary system: Secondary | ICD-10-CM | POA: Diagnosis not present

## 2022-12-15 DIAGNOSIS — E119 Type 2 diabetes mellitus without complications: Secondary | ICD-10-CM

## 2022-12-15 LAB — MICROALBUMIN / CREATININE URINE RATIO
Creatinine,U: 144.8 mg/dL
Microalb Creat Ratio: 0.5 mg/g (ref 0.0–30.0)
Microalb, Ur: 0.8 mg/dL (ref 0.0–1.9)

## 2022-12-15 MED ORDER — TAMSULOSIN HCL 0.4 MG PO CAPS
0.4000 mg | ORAL_CAPSULE | Freq: Every day | ORAL | 3 refills | Status: DC
Start: 2022-12-15 — End: 2023-12-03

## 2022-12-16 NOTE — Progress Notes (Unsigned)
Cardiology Office Note   Date:  12/17/2022   ID:  Brandon Stone, DOB 10-05-1945, MRN [id number]  PCP:  Pearline Cables, MD  Cardiologist:   Dietrich Pates, MD     F/U of CAD   History of Present Illness: Brandon Stone is a 77 y.o. male with a history of CAD (s/p CABG in 1999; IWMI in 1998.  Redo CABG in 2004 (SVG to OM/LCx; LRA to PDA).  Cath in July 2013 due to increased CP showed severe LM disease into LCx.; SVG to OM 100%; LIMA to Diag and LAD patent; Radial Artery to PDA patent   LVEF 45%  Plan was to continue medical Rx.  Myoview in 2016 showed scar but no ischemia   2018    Echo showed LVEF normal  Mild diastolic dysfunciton.   I saw the pt in March 2023    Myoview showed no ischemia  Previous inferior scar  Unchanged  After that he was seen by PharmD  The pt says he is  doing well   No CP  Breathing is OK   Had a cough for 4 months   Rx prednisone and inhalers.   The he started taking OTC mullein  Improved/resolved Troubled by toe pain L foot  Problable arthirits    Walks 4000 steps sometimes 5000 per day   Pt says after starting Vascepa he has had no more palpitations   Current Meds  Medication Sig   acetaminophen (TYLENOL) 325 MG tablet Take 325 mg by mouth every 6 (six) hours as needed for mild pain.   allopurinol (ZYLOPRIM) 300 MG tablet Take 1 tablet (300 mg total) by mouth daily.   amLODipine (NORVASC) 10 MG tablet Take 1 tablet (10 mg total) by mouth daily.   aspirin EC 81 MG tablet Take 81 mg by mouth daily.   atorvastatin (LIPITOR) 40 MG tablet Take 1 tablet (40 mg total) by mouth daily.   Evolocumab (REPATHA SURECLICK) 140 MG/ML SOAJ INJECT 1 PEN INTO THE SKIN EVERY 14 (FOURTEEN) DAYS.   glipiZIDE (GLUCOTROL XL) 10 MG 24 hr tablet TAKE 1 TABLET EVERY DAY WITH BREAKFAST   glipiZIDE (GLUCOTROL XL) 5 MG 24 hr tablet TAKE 1 TABLET DAILY WITH BREAKFAST. TAKE WITH 10 MG TABLET FOR 15 MG TOTAL   icosapent Ethyl (VASCEPA) 1 g capsule TAKE 2 CAPSULES BY MOUTH 2 TIMES  DAILY.   isosorbide mononitrate (IMDUR) 60 MG 24 hr tablet TAKE 1 TABLET EVERY DAY   loratadine (CLARITIN) 10 MG tablet Take 10 mg by mouth daily as needed for allergies.   metFORMIN (GLUCOPHAGE) 500 MG tablet TAKE 1 TABLET TWICE DAILY WITH MEALS   metoprolol succinate (TOPROL-XL) 25 MG 24 hr tablet TAKE 1 TABLET IN THE MORNING AND AT BEDTIME   MULTIPLE VITAMIN-FOLIC ACID PO Take 1 tablet by mouth daily. Contains 400 mg folic acid   nitroGLYCERIN (NITROSTAT) 0.4 MG SL tablet Place 1 tablet (0.4 mg total) under the tongue every 5 (five) minutes as needed for chest pain.   omeprazole (PRILOSEC) 20 MG capsule TAKE 1 CAPSULE TWICE DAILY   potassium chloride SA (KLOR-CON M) 20 MEQ tablet Take 1 tablet (20 mEq total) by mouth daily.   tamsulosin (FLOMAX) 0.4 MG CAPS capsule Take 1 capsule (0.4 mg total) by mouth daily.   triamcinolone cream (KENALOG) 0.1 % Apply 1 application topically 2 (two) times daily. Use as needed for dry or itchy skin (Patient taking differently: Apply 1 application  topically daily. Use  as needed for dry or itchy skin)   TRUE METRIX BLOOD GLUCOSE TEST test strip TEST AS DIRECTED DAILY   TRUEplus Lancets 33G MISC USE AS DIRECTED ONCE A DAY.   valsartan-hydrochlorothiazide (DIOVAN-HCT) 160-25 MG tablet TAKE 1 TABLET EVERY DAY     Allergies:   Black pepper [piper], Codeine, Other, Rosuvastatin, Tape, and Ticlopidine hcl   Past Medical History:  Diagnosis Date   Anginal pain (HCC)    Arthritis    Barrett's esophagus    BPH (benign prostatic hypertrophy)    CAD (coronary artery disease)     a. CABG 1991;  b.  IWMI  1998;  c.  S/P PTCA  stents to SVG ot OM last in 2003;  d.  Redo CABG x 2 in 2004 (VG->OM->LCX, LRA->PDA);  e. 12/2011 Cath: 3vd, sev LM into LCX dzs, VG->OM 100, LIMA->DIAG->LAD patent, Rad Art->PDA patent, EF 45%, Med Rx.   Diverticulitis 12/19/2021   Diverticulosis    DM (diabetes mellitus) (HCC)    Dyslipidemia    Dyspnea    Gallstones    GERD  (gastroesophageal reflux disease)    Gout    History of hiatal hernia    HTN (hypertension)    Hyperlipidemia    Leukemia in remission (HCC)    Myocardial infarction (HCC)    at age 71   Psoriasis    Rheumatic fever    as a child   S/P coronary artery stent placement 1998   TIA (transient ischemic attack)    FOUND ON MRI  PER PATIENT-     NO PROBLEM    Past Surgical History:  Procedure Laterality Date   APPENDECTOMY     CHOLECYSTECTOMY N/A 09/18/2016   Procedure: LAPAROSCOPIC CHOLECYSTECTOMY WITH INTRAOPERATIVE CHOLANGIOGRAM;  Surgeon: Avel Peace, MD;  Location: [facility] OR;  Service: General;  Laterality: N/A;   COLONOSCOPY  07/2013   COLONOSCOPY WITH PROPOFOL  07/21/2016   Dr.Armbruster   CORONARY ANGIOPLASTY     STENTS   CORONARY ARTERY BYPASS GRAFT     1991 (LIMA to LAD/Diag;  SVG to RCA;  SVG to OM)   CORONARY ARTERY BYPASS GRAFT     2004 (L radial to PDA; SVG to OM1/ distal LCx)   POLYPECTOMY     TONSILLECTOMY AND ADENOIDECTOMY     UMBILICAL HERNIA REPAIR       Social History:  The patient  reports that he quit smoking about 51 years ago. His smoking use included cigarettes. He has a 4.00 pack-year smoking history. He has never used smokeless tobacco. He reports that he does not drink alcohol and does not use drugs.   Family History:  The patient's family history includes Diabetes in his father and mother; Heart disease in his father and mother; Hyperlipidemia in his father and mother; Hypertension in his father and mother; Skin cancer in his mother; Stroke in his brother and sister.    ROS:  Please see the history of present illness. All other systems are reviewed and  Negative to the above problem except as noted.    PHYSICAL EXAM: VS:  BP 130/80   Pulse 74   Ht 6' (1.829 m)   Wt 225 lb 3.2 oz (102.2 kg)   SpO2 94%   BMI 30.54 kg/m   GEN: Obese 77 yo, in no acute distress  HEENT: normal  Neck: JVP is not elevated    No carotid bruits Cardiac: RRR; no  murmur No LE  edema  Respiratory:  clear to auscultation  bilaterally,  GI: soft, nontender,  No hepatomegaly  MS: no deformity Moving all extremities     EKG:  EKG is  not done today   Myovew May 2023    Patient exercised according to the BRUCE protocol for 5:90min achieving 7.0 METs   Target HR was achieved (171bpm; 116% MPHR)   Findings are consistent with prior myocardial infarction. The study is intermediate risk.   ST depression in the inferolateral leads was noted.   LV perfusion is abnormal. Defect 1: There is a medium defect with severe reduction in uptake present in the apical to basal inferior and inferolateral location(s) that is fixed. There is abnormal wall motion in the defect area. Consistent with infarction. Defect 2: There is a small defect with moderate reduction in uptake present in the mid to basal anterolateral location(s) that is fixed. Consistent with infarction.   Left ventricular function is abnormal. Nuclear stress EF: 47 %. The left ventricular ejection fraction is mildly decreased (45-54%). End diastolic cavity size is normal.   Prior study available for comparison from 09/26/2014. No significant change from prior study. There continues to be infarct but no ischemia.  Monitor   2020  Sinus rhythm with occasional PVCs, short burst SVT (5 beats)   Echo   2018  - Left ventricle: The cavity size was normal. There was moderate    concentric hypertrophy. Systolic function was normal. The    estimated ejection fraction was in the range of 50% to 55%. There    is hypokinesis in the basal and mid inferior and inferolateral    walls. Doppler parameters are consistent with abnormal left    ventricular relaxation (grade 1 diastolic dysfunction). There was    no evidence of elevated ventricular filling pressure by Doppler    parameters.  - Aortic valve: Trileaflet; mildly thickened, mildly calcified    leaflets. Mobility was not restricted. Transvalvular velocity was     within the normal range. There was no stenosis. There was mild    regurgitation.  - Aortic root: The aortic root was mildly dilated measuring 42 mm.  - Ascending aorta: The ascending aorta was normal in size.  - Mitral valve: There was no regurgitation.  - Left atrium: The atrium was moderately dilated.  - Right ventricle: Systolic function was normal.  - Tricuspid valve: There was mild regurgitation.  - Pulmonic valve: There was trivial regurgitation.  - Pulmonary arteries: Systolic pressure was within the normal    range.  - Inferior vena cava: The vessel was normal in size.  - Pericardium, extracardiac: There was no pericardial effusion.    Lipid Panel    Component Value Date/Time   CHOL 98 08/04/2022 1053   CHOL 79 (L) 06/01/2020 0900   TRIG 208.0 (H) 08/04/2022 1053   HDL 38.40 (L) 08/04/2022 1053   HDL 42 06/01/2020 0900   CHOLHDL 3 08/04/2022 1053   VLDL 41.6 (H) 08/04/2022 1053   LDLCALC 12 06/01/2020 0900   LDLDIRECT 36.0 08/04/2022 1053      Wt Readings from Last 3 Encounters:  12/17/22 225 lb 3.2 oz (102.2 kg)  12/15/22 222 lb 3.2 oz (100.8 kg)  10/21/22 222 lb 6.4 oz (100.9 kg)      ASSESSMENT AND PLAN:  1   CAD  Pt getting close to 20 years from CABG   Myoview in 2023 showed no ischemia  Inferior scar Doing well  no symptoms  Volume status good   Continue  2.  HTN  BP controlled on current regimen   3  HL LDL was excellent when last checked   I would recomm cutting back on lipitor to 20 mg   Keep on Repatha and Vascepa   Check labs in September   4  DM  Diet not too bad  Cut back on bread   walk after meals     Question expanding meds to get tighter control    5  Heme  Pt follows with P Ennever   Stage A CLL   Follow     F/U in May 2024     Current medicines are reviewed at length with the patient today.  The patient does not have concerns regarding medicines.  Signed, Dietrich Pates, MD  12/17/2022 8:51 AM    [facility] [address] [place], [place], [place]  [address] Phone: [phone]; Fax: [phone]

## 2022-12-17 ENCOUNTER — Encounter: Payer: Self-pay | Admitting: Internal Medicine

## 2022-12-17 ENCOUNTER — Ambulatory Visit: Payer: PRIVATE HEALTH INSURANCE | Attending: Internal Medicine | Admitting: Internal Medicine

## 2022-12-17 VITALS — BP 130/80 | HR 74 | Ht 72.0 in | Wt 225.2 lb

## 2022-12-17 DIAGNOSIS — E785 Hyperlipidemia, unspecified: Secondary | ICD-10-CM | POA: Diagnosis not present

## 2022-12-17 DIAGNOSIS — I10 Essential (primary) hypertension: Secondary | ICD-10-CM | POA: Diagnosis not present

## 2022-12-17 DIAGNOSIS — I2510 Atherosclerotic heart disease of native coronary artery without angina pectoris: Secondary | ICD-10-CM | POA: Diagnosis not present

## 2022-12-17 MED ORDER — ATORVASTATIN CALCIUM 20 MG PO TABS: 20.0000 mg | ORAL_TABLET | Freq: Every day | ORAL | 3 refills | Status: DC

## 2022-12-17 NOTE — Patient Instructions (Addendum)
Medication Instructions:  Your physician has recommended you make the following change in your medication:  1) DECREASE atorvastatin (Lipitor) to 20mg  daily  *If you need a refill on your cardiac medications before your next appointment, please call your pharmacy*  Lab Work: In September 2024: Lipomed, hemoglobin A1c, ApoB, LPa. If you have labs (blood work) drawn today and your tests are completely normal, you will receive your results only by: MyChart Message (if you have MyChart) OR A paper copy in the mail If you have any lab test that is abnormal or we need to change your treatment, we will call you to review the results.  Testing/Procedures: None ordered today.  Follow-Up: At [facility], you and your health needs are our priority.  As part of our continuing mission to provide you with exceptional heart care, we have created designated Provider Care Teams.  These Care Teams include your primary Cardiologist (physician) and Advanced Practice Providers (APPs -  Physician Assistants and Nurse Practitioners) who all work together to provide you with the care you need, when you need it.  Your next appointment:   12 month(s)  The format for your next appointment:   In Person  Provider:   Dietrich Pates, MD {

## 2023-01-18 ENCOUNTER — Other Ambulatory Visit: Payer: Self-pay | Admitting: Internal Medicine

## 2023-02-01 ENCOUNTER — Encounter: Payer: Self-pay | Admitting: Family Medicine

## 2023-02-02 MED ORDER — METFORMIN HCL 1000 MG PO TABS: 1000.0000 mg | ORAL_TABLET | Freq: Two times a day (BID) | ORAL | 3 refills | Status: DC

## 2023-02-12 DIAGNOSIS — R0981 Nasal congestion: Secondary | ICD-10-CM | POA: Diagnosis not present

## 2023-02-12 DIAGNOSIS — J209 Acute bronchitis, unspecified: Secondary | ICD-10-CM | POA: Diagnosis not present

## 2023-02-12 DIAGNOSIS — R051 Acute cough: Secondary | ICD-10-CM | POA: Diagnosis not present

## 2023-02-12 DIAGNOSIS — R0782 Intercostal pain: Secondary | ICD-10-CM | POA: Diagnosis not present

## 2023-02-14 ENCOUNTER — Encounter (INDEPENDENT_AMBULATORY_CARE_PROVIDER_SITE_OTHER): Payer: Medicare HMO | Admitting: Family Medicine

## 2023-02-14 DIAGNOSIS — B029 Zoster without complications: Secondary | ICD-10-CM | POA: Diagnosis not present

## 2023-02-14 MED ORDER — VALACYCLOVIR HCL 1 G PO TABS
1000.0000 mg | ORAL_TABLET | Freq: Three times a day (TID) | ORAL | 0 refills | Status: DC
Start: 2023-02-14 — End: 2023-06-04

## 2023-02-14 NOTE — Telephone Encounter (Signed)
Please see the MyChart message reply(ies) for my assessment and plan.  The patient gave consent for this Medical Advice Message and is aware that it may result in a bill to their insurance company as well as the possibility that this may result in a co-payment or deductible. They are an established patient, but are not seeking medical advice exclusively about a problem treated during an in person or video visit in the last 7 days. I did not recommend an in person or video visit within 7 days of my reply.  I spent a total of 14 minutes cumulative time within 7 days through [employer] Abbe Amsterdam, MD

## 2023-02-23 ENCOUNTER — Encounter: Payer: Self-pay | Admitting: Family Medicine

## 2023-02-23 DIAGNOSIS — B028 Zoster with other complications: Secondary | ICD-10-CM

## 2023-03-07 ENCOUNTER — Other Ambulatory Visit: Payer: Self-pay | Admitting: Internal Medicine

## 2023-03-12 ENCOUNTER — Encounter (HOSPITAL_BASED_OUTPATIENT_CLINIC_OR_DEPARTMENT_OTHER): Payer: Self-pay | Admitting: Emergency Medicine

## 2023-03-12 ENCOUNTER — Emergency Department (HOSPITAL_BASED_OUTPATIENT_CLINIC_OR_DEPARTMENT_OTHER)
Admission: EM | Admit: 2023-03-12 | Discharge: 2023-03-12 | Disposition: A | Discharge status: Home / Self Care | Payer: Medicare HMO | Attending: Emergency Medicine | Admitting: Emergency Medicine

## 2023-03-12 ENCOUNTER — Other Ambulatory Visit: Payer: Self-pay

## 2023-03-12 DIAGNOSIS — T50901A Poisoning by unspecified drugs, medicaments and biological substances, accidental (unintentional), initial encounter: Secondary | ICD-10-CM | POA: Diagnosis not present

## 2023-03-12 DIAGNOSIS — B029 Zoster without complications: Secondary | ICD-10-CM | POA: Diagnosis not present

## 2023-03-12 DIAGNOSIS — Z7982 Long term (current) use of aspirin: Secondary | ICD-10-CM | POA: Insufficient documentation

## 2023-03-12 DIAGNOSIS — T391X1A Poisoning by 4-Aminophenol derivatives, accidental (unintentional), initial encounter: Secondary | ICD-10-CM | POA: Diagnosis not present

## 2023-03-12 LAB — CBC WITH DIFFERENTIAL/PLATELET
Abs Immature Granulocytes: 0.04 10*3/uL (ref 0.00–0.07)
Basophils Absolute: 0.1 10*3/uL (ref 0.0–0.1)
Basophils Relative: 0 %
Eosinophils Absolute: 0.7 10*3/uL — ABNORMAL HIGH (ref 0.0–0.5)
Eosinophils Relative: 4 %
HCT: 43.6 % (ref 39.0–52.0)
Hemoglobin: 14.7 g/dL (ref 13.0–17.0)
Immature Granulocytes: 0 %
Lymphocytes Relative: 65 %
Lymphs Abs: 13.3 10*3/uL — ABNORMAL HIGH (ref 0.7–4.0)
MCH: 30.3 pg (ref 26.0–34.0)
MCHC: 33.7 g/dL (ref 30.0–36.0)
MCV: 89.9 fL (ref 80.0–100.0)
Monocytes Absolute: 1 10*3/uL (ref 0.1–1.0)
Monocytes Relative: 5 %
Neutro Abs: 5.3 10*3/uL (ref 1.7–7.7)
Neutrophils Relative %: 26 %
Platelets: 236 10*3/uL (ref 150–400)
RBC: 4.85 MIL/uL (ref 4.22–5.81)
RDW: 15.5 % (ref 11.5–15.5)
Smear Review: NORMAL
WBC: 20.7 10*3/uL — ABNORMAL HIGH (ref 4.0–10.5)
nRBC: 0 % (ref 0.0–0.2)

## 2023-03-12 LAB — COMPREHENSIVE METABOLIC PANEL
ALT: 19 U/L (ref 0–44)
AST: 21 U/L (ref 15–41)
Albumin: 4.3 g/dL (ref 3.5–5.0)
Alkaline Phosphatase: 49 U/L (ref 38–126)
Anion gap: 12 (ref 5–15)
BUN: 22 mg/dL (ref 8–23)
CO2: 23 mmol/L (ref 22–32)
Calcium: 9.6 mg/dL (ref 8.9–10.3)
Chloride: 102 mmol/L (ref 98–111)
Creatinine, Ser: 1.16 mg/dL (ref 0.61–1.24)
GFR, Estimated: >60 mL/min (ref >60)
Glucose, Bld: 102 mg/dL — ABNORMAL HIGH (ref 70–99)
Potassium: 3.6 mmol/L (ref 3.5–5.1)
Sodium: 137 mmol/L (ref 135–145)
Total Bilirubin: 1 mg/dL (ref 0.3–1.2)
Total Protein: 7.3 g/dL (ref 6.5–8.1)

## 2023-03-12 LAB — PROTIME-INR
INR: 0.9 (ref 0.8–1.2)
Prothrombin Time: 12.1 seconds (ref 11.4–15.2)

## 2023-03-12 LAB — ACETAMINOPHEN LEVEL: Acetaminophen (Tylenol), Serum: 14 ug/mL (ref 10–30)

## 2023-03-12 MED ORDER — GABAPENTIN 300 MG PO CAPS: 300.0000 mg | ORAL_CAPSULE | Freq: Three times a day (TID) | ORAL | 3 refills | Status: DC

## 2023-03-12 NOTE — Telephone Encounter (Signed)
Called poison control.  Patient has been accidentally taking too much acetaminophen, 1500 mg 4 times a day or a little bit more= 6gm + daily, he has been doing this for 4 days.  Poison control recommended he go to the emergency room now for blood work to check on his liver.  I called patient back and gave him this information, he plans to come to the med [facility] I asked him to please let the triage nurse know he accidentally took too much Tylenol and was advised to come in by poison control

## 2023-03-12 NOTE — Discharge Instructions (Signed)
Take no more than 4 g of Tylenol a day.  This typically is 2 extra strength tablets 4 times a day.  Please follow-up with your family doctor in the office.

## 2023-03-12 NOTE — Telephone Encounter (Signed)
Also will need acetaminophen level

## 2023-03-12 NOTE — Telephone Encounter (Signed)
Pt called stating that his shingles seem to be healing up fine but there is still a considerable amount of pain. Pt is requesting a stronger pain medicine be sent in or if Dr. Patsy Lager has other advice to let him know by phone or MyChart. Please Advise.

## 2023-03-12 NOTE — ED Triage Notes (Signed)
Pt reports having shingles and started taking increased Tylenol for pain, reports taking 1500mg  q5h for the last 4 days, denies any unusual symptoms

## 2023-03-12 NOTE — ED Provider Notes (Signed)
EMERGENCY DEPARTMENT AT [facility] Provider Note   CSN: [id number] Arrival date & time: 03/12/23  1801     History  Chief Complaint  Patient presents with   Drug Overdose    Tylenol    Brandon Stone is a 77 y.o. male.  77 yo M with a chief concern for taking a supratherapeutic dose of Tylenol for too long.  The patient has had shingles and is on his fourth week of symptoms.  He has been having issues with pain control at home and stated 6 g of Tylenol a day.  Past 4 days.  He had called his family doctor to let them know and they recommended he come to the ED for this.   Drug Overdose       Home Medications Prior to Admission medications   Medication Sig Start Date End Date Taking? Authorizing Provider  acetaminophen (TYLENOL) 325 MG tablet Take 325 mg by mouth every 6 (six) hours as needed for mild pain.    [provider]  allopurinol (ZYLOPRIM) 300 MG tablet Take 1 tablet (300 mg total) by mouth daily. 10/28/22   Copland, Gwenlyn Found, MD  amLODipine (NORVASC) 10 MG tablet TAKE 1 TABLET EVERY DAY 03/09/23   Pricilla Riffle, MD  aspirin EC 81 MG tablet Take 81 mg by mouth daily.    [provider]  atorvastatin (LIPITOR) 20 MG tablet Take 1 tablet (20 mg total) by mouth daily. 12/17/22   Pricilla Riffle, MD  Evolocumab (REPATHA SURECLICK) 140 MG/ML SOAJ INJECT 1 PEN INTO THE SKIN EVERY 14 (FOURTEEN) DAYS. 06/13/22   Pricilla Riffle, MD  gabapentin (NEURONTIN) 300 MG capsule Take 1 capsule (300 mg total) by mouth 3 (three) times daily. To start, taper up as directed by MD 03/12/23   Copland, Gwenlyn Found, MD  glipiZIDE (GLUCOTROL XL) 10 MG 24 hr tablet TAKE 1 TABLET EVERY DAY WITH BREAKFAST 10/13/22   Copland, Gwenlyn Found, MD  glipiZIDE (GLUCOTROL XL) 5 MG 24 hr tablet TAKE 1 TABLET DAILY WITH BREAKFAST. TAKE WITH 10 MG TABLET FOR 15 MG TOTAL 07/21/22   Copland, Gwenlyn Found, MD  icosapent Ethyl (VASCEPA) 1 g capsule TAKE 2 CAPSULES BY MOUTH 2 TIMES DAILY.  09/04/22   Pricilla Riffle, MD  isosorbide mononitrate (IMDUR) 60 MG 24 hr tablet TAKE 1 TABLET EVERY DAY 01/21/23   Pricilla Riffle, MD  loratadine (CLARITIN) 10 MG tablet Take 10 mg by mouth daily as needed for allergies.    [provider]  metFORMIN (GLUCOPHAGE) 1000 MG tablet Take 1 tablet (1,000 mg total) by mouth 2 (two) times daily with a meal. 02/02/23   Copland, Gwenlyn Found, MD  metoprolol succinate (TOPROL-XL) 25 MG 24 hr tablet TAKE 1 TABLET IN THE MORNING AND AT BEDTIME 03/09/23   Pricilla Riffle, MD  MULTIPLE VITAMIN-FOLIC ACID PO Take 1 tablet by mouth daily. Contains 400 mg folic acid    [provider]  nitroGLYCERIN (NITROSTAT) 0.4 MG SL tablet Place 1 tablet (0.4 mg total) under the tongue every 5 (five) minutes as needed for chest pain. 03/13/21   Copland, Gwenlyn Found, MD  omeprazole (PRILOSEC) 20 MG capsule TAKE 1 CAPSULE TWICE DAILY 11/18/22   Copland, Gwenlyn Found, MD  potassium chloride SA (KLOR-CON M) 20 MEQ tablet Take 1 tablet (20 mEq total) by mouth daily. 10/28/22   Copland, Gwenlyn Found, MD  tamsulosin (FLOMAX) 0.4 MG CAPS capsule Take 1 capsule (0.4 mg  total) by mouth daily. 12/15/22   Copland, Gwenlyn Found, MD  triamcinolone cream (KENALOG) 0.1 % Apply 1 application topically 2 (two) times daily. Use as needed for dry or itchy skin Patient taking differently: Apply 1 application  topically daily. Use as needed for dry or itchy skin 02/05/17   Copland, Gwenlyn Found, MD  TRUE METRIX BLOOD GLUCOSE TEST test strip TEST AS DIRECTED DAILY 12/05/22   Copland, Gwenlyn Found, MD  TRUEplus Lancets 33G MISC USE AS DIRECTED ONCE A DAY. 12/05/22   Copland, Gwenlyn Found, MD  valACYclovir (VALTREX) 1000 MG tablet Take 1 tablet (1,000 mg total) by mouth 3 (three) times daily. 02/14/23   Copland, Gwenlyn Found, MD  valsartan-hydrochlorothiazide (DIOVAN-HCT) 160-25 MG tablet TAKE 1 TABLET EVERY DAY 10/28/22   Pricilla Riffle, MD      Allergies    Black pepper [piper], Codeine, Other, Rosuvastatin, Tape, and  Ticlopidine hcl    Review of Systems   Review of Systems  Physical Exam Updated Vital Signs BP 132/86 (BP Location: Right Arm)   Pulse 84   Temp 97.9 F (36.6 C) (Oral)   Resp 18   Ht 6' (1.829 m)   Wt 93 kg   SpO2 97%   BMI 27.80 kg/m  Physical Exam Vitals and nursing note reviewed.  Constitutional:      Appearance: He is well-developed.  HENT:     Head: Normocephalic and atraumatic.  Eyes:     Pupils: Pupils are equal, round, and reactive to light.  Neck:     Vascular: No JVD.  Cardiovascular:     Rate and Rhythm: Normal rate and regular rhythm.     Heart sounds: No murmur heard.    No friction rub. No gallop.  Pulmonary:     Effort: No respiratory distress.     Breath sounds: No wheezing.  Abdominal:     General: There is no distension.     Tenderness: There is no abdominal tenderness. There is no guarding or rebound.  Musculoskeletal:        General: Normal range of motion.     Cervical back: Normal range of motion and neck supple.     Comments: Vesicular rash that is faded along the right trunk  Skin:    Coloration: Skin is not pale.     Findings: No rash.  Neurological:     Mental Status: He is alert and oriented to person, place, and time.  Psychiatric:        Behavior: Behavior normal.     ED Results / Procedures / Treatments   Labs (all labs ordered are listed, but only abnormal results are displayed) Labs Reviewed  CBC WITH DIFFERENTIAL/PLATELET - Abnormal; Notable for the following components:      Result Value   WBC 20.7 (*)    Lymphs Abs 13.3 (*)    Eosinophils Absolute 0.7 (*)    All other components within normal limits  COMPREHENSIVE METABOLIC PANEL - Abnormal; Notable for the following components:   Glucose, Bld 102 (*)    All other components within normal limits  ACETAMINOPHEN LEVEL  PROTIME-INR  PATHOLOGIST SMEAR REVIEW    EKG None  Radiology No results found.  Procedures Procedures    Medications Ordered in  ED Medications - No data to display  ED Course/ Medical Decision Making/ A&P  Medical Decision Making Amount and/or Complexity of Data Reviewed Labs: ordered.   77 yo M with a chief complaints of a supratherapeutic dose of Tylenol taken for 4 days.  Reportedly been taking 6 g a day due to pain from his shingles.  I discussed the case with poison control who recommended liver function tests and acetaminophen level.  These are negative.  Cleared by poison control.  I discussed normal dosing for Tylenol for home.  Will have him follow-up with his family doctor.  8:53 PM:  I have discussed the diagnosis/risks/treatment options with the patient and family.  Evaluation and diagnostic testing in the emergency department does not suggest an emergent condition requiring admission or immediate intervention beyond what has been performed at this time.  They will follow up with PCP. We also discussed returning to the ED immediately if new or worsening sx occur. We discussed the sx which are most concerning (e.g., sudden worsening pain, fever, inability to tolerate by mouth ) that necessitate immediate return. Medications administered to the patient during their visit and any new prescriptions provided to the patient are listed below.  Medications given during this visit Medications - No data to display   The patient appears reasonably screen and/or stabilized for discharge and I doubt any other medical condition or other [facility] requiring further screening, evaluation, or treatment in the ED at this time prior to discharge.          Final Clinical Impression(s) / ED Diagnoses Final diagnoses:  Tylenol overdose, accidental or unintentional, initial encounter    Rx / DC Orders ED Discharge Orders     None         Melene Plan, DO 03/12/23 2053

## 2023-03-12 NOTE — ED Notes (Signed)
Per Poison control no further testing or treatment needed at this time. EDP to call Kendal Hymen at poison control [phone] to follow up.

## 2023-03-13 LAB — PATHOLOGIST SMEAR REVIEW

## 2023-03-17 NOTE — Patient Instructions (Incomplete)
It was good to see you again today, I am sorry this shingles has been so troublesome!   I will touch base with Dr. Myna Hidalgo about your recent white cell count and make sure he is not concerned  You might try lidocaine patches for your pain, just do not apply to any currently open skin  I gave you hydrocodone with acetaminophen pills to use as needed for pain.  These can make you drowsy, use with caution but do take them when needed to control your pain!   Please keep me posted about your progress

## 2023-03-17 NOTE — Progress Notes (Unsigned)
[facility] [address], [address] [place], [place] [address] [phone]  Date:  03/18/2023   Name:  Brandon Stone   DOB:  11-Aug-1945   MRN:  [id number]  PCP:  Pearline Cables, MD    Chief Complaint: No chief complaint on file.   History of Present Illness:  Brandon Stone is a 77 y.o. 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  Unfortunately he had significant pain, we were in touch last week and found out he had taken accidental overdose of acetaminophen He was evaluated in the ER where acetaminophen level, LFTs were normal-he was released to home  I did start him on gabapentin for pain last week as well Patient Active Problem List   Diagnosis Date Noted   Capsulitis of left shoulder 03/31/2022   AAA (abdominal aortic aneurysm) (HCC) 10/31/2019   Subdural hematoma (HCC) 09/08/2018   Diabetes mellitus without complication (HCC) 01/31/2018   Symptomatic cholelithiasis 09/18/2016   Chronic lymphocytic leukemia (CLL), B-cell (HCC) 05/13/2014   Stable angina 01/26/2012   Hyperlipidemia 06/21/2008   HYPERTENSION, BENIGN 06/21/2008   CAD, NATIVE VESSEL 06/21/2008    Past Medical History:  Diagnosis Date   Anginal pain (HCC)    Arthritis    Barrett's esophagus    BPH (benign prostatic hypertrophy)    CAD (coronary artery disease)     a. CABG 1991;  b.  IWMI  1998;  c.  S/P PTCA  stents to SVG ot OM last in 2003;  d.  Redo CABG x 2 in 2004 (VG->OM->LCX, LRA->PDA);  e. 12/2011 Cath: 3vd, sev LM into LCX dzs, VG->OM 100, LIMA->DIAG->LAD patent, Rad Art->PDA patent, EF 45%, Med Rx.   Diverticulitis 12/19/2021    Diverticulosis    DM (diabetes mellitus) (HCC)    Dyslipidemia    Dyspnea    Gallstones    GERD (gastroesophageal reflux disease)    Gout    History of hiatal hernia    HTN (hypertension)    Hyperlipidemia    Leukemia in remission (HCC)    Myocardial infarction (HCC)    at age 11   Psoriasis    Rheumatic fever    as a child   S/P coronary artery stent placement 1998   TIA (transient ischemic attack)    FOUND ON MRI  PER PATIENT-     NO PROBLEM    Past Surgical History:  Procedure Laterality Date   APPENDECTOMY     CHOLECYSTECTOMY N/A 09/18/2016   Procedure: LAPAROSCOPIC CHOLECYSTECTOMY WITH INTRAOPERATIVE CHOLANGIOGRAM;  Surgeon: Avel Peace, MD;  Location: [facility] OR;  Service: General;  Laterality: N/A;   COLONOSCOPY  07/2013   COLONOSCOPY WITH PROPOFOL  07/21/2016   Dr.Armbruster   CORONARY ANGIOPLASTY     STENTS   CORONARY ARTERY BYPASS GRAFT     1991 (LIMA to LAD/Diag;  SVG to RCA;  SVG to OM)   CORONARY ARTERY BYPASS GRAFT     2004 (L radial to PDA; SVG to OM1/ distal LCx)   POLYPECTOMY     TONSILLECTOMY AND ADENOIDECTOMY     UMBILICAL HERNIA REPAIR      Social History  Tobacco Use   Smoking status: Former    Current packs/day: 0.00    Average packs/day: 0.5 packs/day for 8.0 years (4.0 ttl pk-yrs)    Types: Cigarettes    Start date: 06/24/1963    Quit date: 06/24/1971    Years since quitting: 51.7   Smokeless tobacco: Never  Vaping Use   Vaping status: Never Used  Substance Use Topics   Alcohol use: No   Drug use: No    Family History  Problem Relation Age of Onset   Heart disease Mother    Hypertension Mother    Hyperlipidemia Mother    Diabetes Mother    Skin cancer Mother        skin   Heart disease Father    Hypertension Father    Hyperlipidemia Father    Diabetes Father    Stroke Sister    Stroke Brother    Colon cancer Neg Hx    Esophageal cancer Neg Hx    Stomach cancer Neg Hx    Rectal cancer Neg Hx     Allergies  Allergen  Reactions   Black Pepper [Piper] Palpitations   Codeine Palpitations and Other (See Comments)    Wild dreams, palpitations   Other Hives and Rash    opsite and tegaderm tape.   Rosuvastatin Nausea Only   Tape Rash    Blisters from Tegaderm and tape with orange backing(opsite?)   Ticlopidine Hcl Hives and Itching    Medication list has been reviewed and updated.  Current Outpatient Medications on File Prior to Visit  Medication Sig Dispense Refill   acetaminophen (TYLENOL) 325 MG tablet Take 325 mg by mouth every 6 (six) hours as needed for mild pain.     allopurinol (ZYLOPRIM) 300 MG tablet Take 1 tablet (300 mg total) by mouth daily. 90 tablet 1   amLODipine (NORVASC) 10 MG tablet TAKE 1 TABLET EVERY DAY 90 tablet 3   aspirin EC 81 MG tablet Take 81 mg by mouth daily.     atorvastatin (LIPITOR) 20 MG tablet Take 1 tablet (20 mg total) by mouth daily. 90 tablet 3   Evolocumab (REPATHA SURECLICK) 140 MG/ML SOAJ INJECT 1 PEN INTO THE SKIN EVERY 14 (FOURTEEN) DAYS. 6 mL 3   gabapentin (NEURONTIN) 300 MG capsule Take 1 capsule (300 mg total) by mouth 3 (three) times daily. To start, taper up as directed by MD 90 capsule 3   glipiZIDE (GLUCOTROL XL) 10 MG 24 hr tablet TAKE 1 TABLET EVERY DAY WITH BREAKFAST 90 tablet 3   glipiZIDE (GLUCOTROL XL) 5 MG 24 hr tablet TAKE 1 TABLET DAILY WITH BREAKFAST. TAKE WITH 10 MG TABLET FOR 15 MG TOTAL 90 tablet 3   icosapent Ethyl (VASCEPA) 1 g capsule TAKE 2 CAPSULES BY MOUTH 2 TIMES DAILY. 360 capsule 1   isosorbide mononitrate (IMDUR) 60 MG 24 hr tablet TAKE 1 TABLET EVERY DAY 90 tablet 3   loratadine (CLARITIN) 10 MG tablet Take 10 mg by mouth daily as needed for allergies.     metFORMIN (GLUCOPHAGE) 1000 MG tablet Take 1 tablet (1,000 mg total) by mouth 2 (two) times daily with a meal. 180 tablet 3   metoprolol succinate (TOPROL-XL) 25 MG 24 hr tablet TAKE 1 TABLET IN THE MORNING AND AT BEDTIME 180 tablet 3   MULTIPLE VITAMIN-FOLIC ACID PO Take 1  tablet by mouth daily. Contains 400 mg folic acid     nitroGLYCERIN (NITROSTAT) 0.4 MG SL tablet Place 1 tablet (0.4 mg  total) under the tongue every 5 (five) minutes as needed for chest pain. 25 tablet 3   omeprazole (PRILOSEC) 20 MG capsule TAKE 1 CAPSULE TWICE DAILY 180 capsule 3   potassium chloride SA (KLOR-CON M) 20 MEQ tablet Take 1 tablet (20 mEq total) by mouth daily. 90 tablet 1   tamsulosin (FLOMAX) 0.4 MG CAPS capsule Take 1 capsule (0.4 mg total) by mouth daily. 90 capsule 3   triamcinolone cream (KENALOG) 0.1 % Apply 1 application topically 2 (two) times daily. Use as needed for dry or itchy skin (Patient taking differently: Apply 1 application  topically daily. Use as needed for dry or itchy skin) 30 g 2   TRUE METRIX BLOOD GLUCOSE TEST test strip TEST AS DIRECTED DAILY 100 strip 3   TRUEplus Lancets 33G MISC USE AS DIRECTED ONCE A DAY. 100 each 3   valACYclovir (VALTREX) 1000 MG tablet Take 1 tablet (1,000 mg total) by mouth 3 (three) times daily. 21 tablet 0   valsartan-hydrochlorothiazide (DIOVAN-HCT) 160-25 MG tablet TAKE 1 TABLET EVERY DAY 90 tablet 3   No current facility-administered medications on file prior to visit.    Review of Systems:  As per HPI- otherwise negative.   Physical Examination: There were no vitals filed for this visit. There were no vitals filed for this visit. There is no height or weight on file to calculate BMI. Ideal Body Weight:    GEN: no acute distress. HEENT: Atraumatic, Normocephalic.  Ears and Nose: No external deformity. CV: RRR, No M/G/R. No JVD. No thrill. No extra heart sounds. PULM: CTA B, no wheezes, crackles, rhonchi. No retractions. No resp. distress. No accessory muscle use. ABD: S, NT, ND, +BS. No rebound. No HSM. EXTR: No c/c/e PSYCH: Normally interactive. Conversant.    Assessment and Plan: ***  Signed Abbe Amsterdam, MD

## 2023-03-18 ENCOUNTER — Ambulatory Visit: Payer: Medicare HMO | Admitting: Family Medicine

## 2023-03-18 ENCOUNTER — Encounter: Payer: Self-pay | Admitting: Family Medicine

## 2023-03-18 ENCOUNTER — Ambulatory Visit: Payer: PRIVATE HEALTH INSURANCE

## 2023-03-18 VITALS — BP 120/62 | HR 77 | Temp 98.0°F | Resp 18 | Ht 72.0 in | Wt 211.0 lb

## 2023-03-18 DIAGNOSIS — B028 Zoster with other complications: Secondary | ICD-10-CM | POA: Diagnosis not present

## 2023-03-18 MED ORDER — HYDROCODONE-ACETAMINOPHEN 5-325 MG PO TABS
1.0000 | ORAL_TABLET | Freq: Three times a day (TID) | ORAL | 0 refills | Status: AC | PRN
Start: 2023-03-18 — End: 2023-03-23

## 2023-03-20 ENCOUNTER — Encounter: Payer: Self-pay | Admitting: Family Medicine

## 2023-03-20 DIAGNOSIS — B028 Zoster with other complications: Secondary | ICD-10-CM

## 2023-03-22 MED ORDER — HYDROCODONE-ACETAMINOPHEN 5-325 MG PO TABS: 1.0000 | ORAL_TABLET | Freq: Three times a day (TID) | ORAL | 0 refills | Status: DC | PRN

## 2023-03-27 MED ORDER — HYDROCODONE-ACETAMINOPHEN 5-325 MG PO TABS: 1.0000 | ORAL_TABLET | Freq: Three times a day (TID) | ORAL | 0 refills | Status: DC | PRN

## 2023-03-27 NOTE — Addendum Note (Signed)
Addended by: Abbe Amsterdam C on: 03/27/2023 06:16 AM   Modules accepted: Orders

## 2023-03-31 NOTE — Progress Notes (Unsigned)
[facility] [address], [address] [place], [place] [address] [phone]  Date:  04/01/2023   Name:  Brandon Stone   DOB:  1946-06-21   MRN:  [id number]  PCP:  Pearline Cables, MD    Chief Complaint: Shingles recheck (Pt said he was feeling better until last night, when he had to restart the Norco. /Concerns/ questions: chest/ muscle spamming started about 2.5 months ago. )   History of Present Illness:  Brandon Stone is a 77 y.o. very pleasant male patient who presents with the following:  Patient seen today for shingles follow-up-  History of diabetes, CLL, hyperlipidemia, hypertension, CAD status post CABG 2004 and 1991, subdural hematoma March 2020  He has an MI in 1998 and had a stent   Extensive CV disease history summarized by Dr Tenny Craw and her recent note:  Brandon Stone is a 77 y.o. male with a history of CAD (s/p CABG in 1999; IWMI in 1998.  Redo CABG in 2004 (SVG to OM/LCx; LRA to PDA).  Cath in July 2013 due to increased CP showed severe LM disease into LCx.; SVG to OM 100%; LIMA to Diag and LAD patent; Radial Artery to PDA patent   LVEF 45%  Plan was to continue medical Rx.  Myoview in 2016 showed scar but no ischemia   2018    Echo showed LVEF normal  Mild diastolic dysfunciton.  Last seen by myself most recently on September 25, he first contacted me about shingles on his right flank at the end of August. Unfortunately he has had quite a severe case of shingles requiring narcotic pain control Currently he is taking hydrocodone 5 mg, 1 tablet every 4 hours as needed I started him on hydrocodone after he had an accidental Tylenol overdose  Pt notes his pain was getting a bit better until last night when he had to reach for hydrocodone again   Today he notes he has been experiencing some chest tightness- felt like the muscles across the front of his chest getting tight and then loosening up again. He thinks this  started just around the time he came down with shingles.  He thinks this is probably shingles pain, but given his cardiac history he wanted to make sure thing was okay Chest pain is nonexertional-he notes his shirt rubbing on the skin increases pain Not SOB   He did a stress 3/23- per Dr Tenny Craw his cardiologist Stress test shows no change in blood flow from previous scan   No evidence of ischemia   There is an area on infierior side of heart where blood flow appears to be down    Present in 2016 scan as well Consistent with old scar Would keep on same meds     Patient Active Problem List   Diagnosis Date Noted   Capsulitis of left shoulder 03/31/2022   AAA (abdominal aortic aneurysm) (HCC) 10/31/2019   Subdural hematoma (HCC) 09/08/2018   Diabetes mellitus without complication (HCC) 01/31/2018   Symptomatic cholelithiasis 09/18/2016   Chronic lymphocytic leukemia (CLL), B-cell (HCC) 05/13/2014   Stable angina (HCC) 01/26/2012   Hyperlipidemia 06/21/2008   HYPERTENSION, BENIGN 06/21/2008   CAD, NATIVE VESSEL 06/21/2008    Past Medical History:  Diagnosis Date   Anginal pain (HCC)    Arthritis    Barrett's esophagus    BPH (benign prostatic hypertrophy)    CAD (coronary artery disease)  a. CABG 1991;  b.  IWMI  1998;  c.  S/P PTCA  stents to SVG ot OM last in 2003;  d.  Redo CABG x 2 in 2004 (VG->OM->LCX, LRA->PDA);  e. 12/2011 Cath: 3vd, sev LM into LCX dzs, VG->OM 100, LIMA->DIAG->LAD patent, Rad Art->PDA patent, EF 45%, Med Rx.   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)    Myocardial infarction (HCC)    at age 14   Psoriasis    Rheumatic fever    as a child   S/P coronary artery stent placement 1998   TIA (transient ischemic attack)    FOUND ON  MRI  PER PATIENT-     NO PROBLEM    Past Surgical History:  Procedure Laterality Date   APPENDECTOMY     CHOLECYSTECTOMY N/A 09/18/2016   Procedure: LAPAROSCOPIC CHOLECYSTECTOMY WITH INTRAOPERATIVE CHOLANGIOGRAM;  Surgeon: Avel Peace, MD;  Location: [facility] OR;  Service: General;  Laterality: N/A;   COLONOSCOPY  07/2013   COLONOSCOPY WITH PROPOFOL  07/21/2016   Dr.Armbruster   CORONARY ANGIOPLASTY     STENTS   CORONARY ARTERY BYPASS GRAFT     1991 (LIMA to LAD/Diag;  SVG to RCA;  SVG to OM)   CORONARY ARTERY BYPASS GRAFT     2004 (L radial to PDA; SVG to OM1/ distal LCx)   POLYPECTOMY     TONSILLECTOMY AND ADENOIDECTOMY     UMBILICAL HERNIA REPAIR      Social History   Tobacco Use   Smoking status: Former    Current packs/day: 0.00    Average packs/day: 0.5 packs/day for 8.0 years (4.0 ttl pk-yrs)    Types: Cigarettes    Start date: 06/24/1963    Quit date: 06/24/1971    Years since quitting: 51.8   Smokeless tobacco: Never  Vaping Use   Vaping status: Never Used  Substance Use Topics   Alcohol use: No   Drug use: No    Family History  Problem Relation Age of Onset   Heart disease Mother    Hypertension Mother    Hyperlipidemia Mother    Diabetes Mother    Skin cancer Mother        skin   Heart disease Father    Hypertension Father    Hyperlipidemia Father    Diabetes Father    Stroke Sister    Stroke Brother    Colon cancer Neg Hx    Esophageal cancer Neg Hx    Stomach cancer Neg Hx    Rectal cancer Neg Hx     Allergies  Allergen Reactions   Black Pepper [Piper] Palpitations   Codeine Palpitations and Other (See Comments)    Wild dreams, palpitations   Other Hives and Rash    opsite and tegaderm tape.   Rosuvastatin Nausea Only   Tape Rash    Blisters from Tegaderm and tape with orange backing(opsite?)   Ticlopidine Hcl Hives and Itching    Medication list has been reviewed and updated.  Current Outpatient Medications on File Prior to Visit   Medication Sig Dispense Refill   acetaminophen (TYLENOL) 325 MG tablet Take 325 mg by mouth every 6 (six) hours as needed for mild  pain.     allopurinol (ZYLOPRIM) 300 MG tablet Take 1 tablet (300 mg total) by mouth daily. 90 tablet 1   amLODipine (NORVASC) 10 MG tablet TAKE 1 TABLET EVERY DAY 90 tablet 3   aspirin EC 81 MG tablet Take 81 mg by mouth daily.     atorvastatin (LIPITOR) 20 MG tablet Take 1 tablet (20 mg total) by mouth daily. 90 tablet 3   Evolocumab (REPATHA SURECLICK) 140 MG/ML SOAJ INJECT 1 PEN INTO THE SKIN EVERY 14 (FOURTEEN) DAYS. 6 mL 3   HYDROcodone-acetaminophen (NORCO/VICODIN) 5-325 MG tablet Take 1-2 tablets by mouth every 8 (eight) hours as needed. 45 tablet 0   icosapent Ethyl (VASCEPA) 1 g capsule TAKE 2 CAPSULES BY MOUTH 2 TIMES DAILY. 360 capsule 1   isosorbide mononitrate (IMDUR) 60 MG 24 hr tablet TAKE 1 TABLET EVERY DAY 90 tablet 3   loratadine (CLARITIN) 10 MG tablet Take 10 mg by mouth daily as needed for allergies.     metFORMIN (GLUCOPHAGE) 1000 MG tablet Take 1 tablet (1,000 mg total) by mouth 2 (two) times daily with a meal. 180 tablet 3   metoprolol succinate (TOPROL-XL) 25 MG 24 hr tablet TAKE 1 TABLET IN THE MORNING AND AT BEDTIME 180 tablet 3   MULTIPLE VITAMIN-FOLIC ACID PO Take 1 tablet by mouth daily. Contains 400 mg folic acid     nitroGLYCERIN (NITROSTAT) 0.4 MG SL tablet Place 1 tablet (0.4 mg total) under the tongue every 5 (five) minutes as needed for chest pain. 25 tablet 3   omeprazole (PRILOSEC) 20 MG capsule TAKE 1 CAPSULE TWICE DAILY 180 capsule 3   potassium chloride SA (KLOR-CON M) 20 MEQ tablet Take 1 tablet (20 mEq total) by mouth daily. 90 tablet 1   tamsulosin (FLOMAX) 0.4 MG CAPS capsule Take 1 capsule (0.4 mg total) by mouth daily. 90 capsule 3   triamcinolone cream (KENALOG) 0.1 % Apply 1 application topically 2 (two) times daily. Use as needed for dry or itchy skin (Patient taking differently: Apply 1 application  topically daily.  Use as needed for dry or itchy skin) 30 g 2   TRUE METRIX BLOOD GLUCOSE TEST test strip TEST AS DIRECTED DAILY 100 strip 3   TRUEplus Lancets 33G MISC USE AS DIRECTED ONCE A DAY. 100 each 3   valACYclovir (VALTREX) 1000 MG tablet Take 1 tablet (1,000 mg total) by mouth 3 (three) times daily. 21 tablet 0   valsartan-hydrochlorothiazide (DIOVAN-HCT) 160-25 MG tablet TAKE 1 TABLET EVERY DAY 90 tablet 3   No current facility-administered medications on file prior to visit.    Review of Systems:  As per HPI- otherwise negative.   Physical Examination: Vitals:   04/01/23 1448 04/01/23 1535  BP: 130/74   Pulse: 80   Resp: 18   Temp: (!) 97.5 F (36.4 C)   SpO2: 92% 97%   Vitals:   04/01/23 1448  Weight: 203 lb 3.2 oz (92.2 kg)  Height: 6' (1.829 m)   Body mass index is 27.56 kg/m. Ideal Body Weight: Weight in (lb) to have BMI = 25: 183.9  GEN: no acute distress.  Minimal overweight, looks well HEENT: Atraumatic, Normocephalic.  Ears and Nose: No external deformity. CV: RRR, No M/G/R. No JVD. No thrill. No extra heart sounds. PULM: CTA B, no wheezes, crackles, rhonchi. No retractions. No resp. distress. No accessory muscle use. ABD: S, NT, ND. No rebound. No HSM. EXTR: No c/c/e PSYCH: Normally interactive. Conversant.  Varicella-zoster rash on right flank  is healing and improving-patient notes it is starting to itch EKG: Several abnormalities but unchanged from previous from 6/24-  I called and discussed with cardiology DOD.  Given character of pain and association with shingles we do not think this is likely to be cardiac in origin.  However, I warned patient to seek care right away if his pain should worsen or change Patient notes he is quite familiar with the pain of ischemia and has not experienced that same pain this time Assessment and Plan: Chest pain, unspecified type - Plan: EKG 12-Lead  Patient seen today with concern of chest pain, attributed to shingles Offered  reassurance for the time being, his shingles is getting better He will seek care right away should his symptoms worsen or change Signed Abbe Amsterdam, MD

## 2023-04-01 ENCOUNTER — Ambulatory Visit: Payer: Medicare HMO | Admitting: Family Medicine

## 2023-04-01 ENCOUNTER — Telehealth: Payer: Self-pay | Admitting: Internal Medicine

## 2023-04-01 VITALS — BP 130/74 | HR 80 | Temp 97.5°F | Resp 18 | Ht 72.0 in | Wt 203.2 lb

## 2023-04-01 DIAGNOSIS — R079 Chest pain, unspecified: Secondary | ICD-10-CM

## 2023-04-01 NOTE — Telephone Encounter (Signed)
Dr. Dallas Schimke stated patient is in her office and is having anterior chest pain.  Dr. Dallas Schimke noted they have done and EKG.

## 2023-04-01 NOTE — Telephone Encounter (Signed)
Left voicemail to return call to office.

## 2023-04-01 NOTE — Telephone Encounter (Signed)
Dr. Dallas Schimke wants a consult regarding this patient.

## 2023-04-01 NOTE — Telephone Encounter (Signed)
77 year old male with extensive CAD with CABG 1998, redo CABG 2004, currently having shingles with profound rash as documented by Dr. Dallas Schimke in her note in 03/18/2023.  Dr. Dallas Schimke contacted today as the patient will was complaining of chest pain while in the office.  Pain started with shingles, and has progressed to his anterior chest wall.  EKG performed in Dr. Durel Salts office personally reviewed by me, shows sinus rhythm with old inferior infarct, unchanged from prior EKG in 11/2022.  Overall, my suspicion for this being unstable angina is low.  At this time, I do not think further cardiac testing is warranted.  If pain were to get worse, or be associated with any other symptoms such as dyspnea, recommend urgent evaluation in urgent care or emergency room, where troponin can be checked.    I will route the conversation to patient's primary cardiologist Dr. Tenny Craw.  Brandon Negus, MD

## 2023-04-03 MED ORDER — HYDROCODONE-ACETAMINOPHEN 5-325 MG PO TABS: 1.0000 | ORAL_TABLET | Freq: Three times a day (TID) | ORAL | 0 refills | Status: DC | PRN

## 2023-04-03 NOTE — Addendum Note (Signed)
Addended by: Abbe Amsterdam C on: 04/03/2023 06:24 PM   Modules accepted: Orders

## 2023-04-11 MED ORDER — HYDROCODONE-ACETAMINOPHEN 5-325 MG PO TABS: 1.0000 | ORAL_TABLET | Freq: Three times a day (TID) | ORAL | 0 refills | Status: DC | PRN

## 2023-04-11 NOTE — Addendum Note (Signed)
Addended by: Abbe Amsterdam C on: 04/11/2023 05:53 PM   Modules accepted: Orders

## 2023-04-17 ENCOUNTER — Ambulatory Visit: Payer: Medicare HMO

## 2023-04-19 MED ORDER — HYDROCODONE-ACETAMINOPHEN 5-325 MG PO TABS
1.0000 | ORAL_TABLET | Freq: Three times a day (TID) | ORAL | 0 refills | Status: DC | PRN
Start: 2023-04-19 — End: 2023-04-27

## 2023-04-19 NOTE — Addendum Note (Signed)
Addended by: Pearline Cables on: 04/19/2023 08:38 PM   Modules accepted: Orders

## 2023-04-24 ENCOUNTER — Other Ambulatory Visit: Payer: Self-pay | Admitting: Internal Medicine

## 2023-04-27 MED ORDER — HYDROCODONE-ACETAMINOPHEN 5-325 MG PO TABS
1.0000 | ORAL_TABLET | Freq: Three times a day (TID) | ORAL | 0 refills | Status: DC | PRN
Start: 2023-04-27 — End: 2023-05-05

## 2023-04-27 NOTE — Addendum Note (Signed)
Addended by: Abbe Amsterdam C on: 04/27/2023 12:04 PM   Modules accepted: Orders

## 2023-05-03 ENCOUNTER — Other Ambulatory Visit: Payer: Self-pay | Admitting: Family Medicine

## 2023-05-03 DIAGNOSIS — Z8739 Personal history of other diseases of the musculoskeletal system and connective tissue: Secondary | ICD-10-CM

## 2023-05-03 DIAGNOSIS — E876 Hypokalemia: Secondary | ICD-10-CM

## 2023-05-05 MED ORDER — HYDROCODONE-ACETAMINOPHEN 5-325 MG PO TABS: 1.0000 | ORAL_TABLET | Freq: Three times a day (TID) | ORAL | 0 refills | Status: DC | PRN

## 2023-05-05 NOTE — Addendum Note (Signed)
Addended by: Abbe Amsterdam C on: 05/05/2023 07:32 AM   Modules accepted: Orders

## 2023-05-11 ENCOUNTER — Other Ambulatory Visit: Payer: Self-pay | Admitting: Internal Medicine

## 2023-05-14 ENCOUNTER — Other Ambulatory Visit: Payer: Self-pay | Admitting: Internal Medicine

## 2023-05-19 ENCOUNTER — Ambulatory Visit: Payer: Medicare HMO

## 2023-05-19 DIAGNOSIS — I2510 Atherosclerotic heart disease of native coronary artery without angina pectoris: Secondary | ICD-10-CM

## 2023-05-19 DIAGNOSIS — E785 Hyperlipidemia, unspecified: Secondary | ICD-10-CM

## 2023-05-20 MED ORDER — HYDROCODONE-ACETAMINOPHEN 5-325 MG PO TABS: 1.0000 | ORAL_TABLET | Freq: Three times a day (TID) | ORAL | 0 refills | Status: DC | PRN

## 2023-05-20 NOTE — Addendum Note (Signed)
Addended by: Abbe Amsterdam C on: 05/20/2023 12:40 PM   Modules accepted: Orders

## 2023-05-25 ENCOUNTER — Ambulatory Visit: Payer: Medicare HMO | Attending: Internal Medicine

## 2023-05-25 DIAGNOSIS — I2510 Atherosclerotic heart disease of native coronary artery without angina pectoris: Secondary | ICD-10-CM | POA: Diagnosis not present

## 2023-05-25 DIAGNOSIS — E785 Hyperlipidemia, unspecified: Secondary | ICD-10-CM | POA: Diagnosis not present

## 2023-05-26 LAB — NMR, LIPOPROFILE
Cholesterol, Total: 107 mg/dL (ref 100–199)
HDL Particle Number: 33.8 umol/L (ref >=30.5)
HDL-C: 42 mg/dL (ref >39)
LDL Particle Number: 647 nmol/L (ref <1000)
LDL Size: 19.8 nmol — ABNORMAL LOW (ref >20.5)
LDL-C (NIH Calc): 38 mg/dL (ref 0–99)
LP-IR Score: 74 — ABNORMAL HIGH (ref <=45)
Small LDL Particle Number: 444 nmol/L (ref <=527)
Triglycerides: 162 mg/dL — ABNORMAL HIGH (ref 0–149)

## 2023-05-26 LAB — HEMOGLOBIN A1C
Est. average glucose Bld gHb Est-mCnc: 160 mg/dL
Hgb A1c MFr Bld: 7.2 % — ABNORMAL HIGH (ref 4.8–5.6)

## 2023-05-26 LAB — APOLIPOPROTEIN B: Apolipoprotein B: 57 mg/dL (ref <90)

## 2023-05-26 LAB — LIPOPROTEIN A (LPA): Lipoprotein (a): 243.4 nmol/L — ABNORMAL HIGH (ref <75.0)

## 2023-06-01 ENCOUNTER — Telehealth: Payer: Self-pay

## 2023-06-01 ENCOUNTER — Ambulatory Visit (INDEPENDENT_AMBULATORY_CARE_PROVIDER_SITE_OTHER): Payer: Medicare HMO | Admitting: Family Medicine

## 2023-06-01 ENCOUNTER — Encounter: Payer: Self-pay | Admitting: Family Medicine

## 2023-06-01 VITALS — BP 126/73 | HR 80 | Ht 72.0 in | Wt 209.0 lb

## 2023-06-01 DIAGNOSIS — B0229 Other postherpetic nervous system involvement: Secondary | ICD-10-CM | POA: Diagnosis not present

## 2023-06-01 MED ORDER — LIDOCAINE 5 % EX PTCH: 1.0000 | MEDICATED_PATCH | CUTANEOUS | 1 refills | Status: DC

## 2023-06-01 NOTE — Progress Notes (Signed)
Acute Office Visit  Subjective:     Patient ID: Brandon Stone, male    DOB: 1945-10-21, 77 y.o.   MRN: [id number]  Chief Complaint  Patient presents with   Herpes Zoster    HPI Patient is in today for postherpetic neuralgia.   Discussed the use of AI scribe software for clinical note transcription with the patient, who gave verbal consent to proceed.  History of Present Illness   The patient, with a history of shingles, presents with persistent and severe pain that has been ongoing for the past five months. The pain is described as almost unbearable, necessitating the use of hydrocodone-acetaminophen every eight hours. The patient reports that the medication provides relief, reducing the pain to a bearable level, but its effects wear off after approximately six hours.  The patient's shingles outbreak was severe, with lesions wrapping around from the back to the abdomen. The lesions have since healed, leaving significant scarring. The patient reports that the pain is exacerbated by touch, including the pressure of clothing and the impact of water during showers. Applying pressure to the area, such as leaning back in a recliner, provides some relief.  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 This condition has been stable and does not disrupt the patient's sleep.  The patient's daily activities have been significantly impacted by the pain. Physical activities, including walking and exercise, have been limited due to the discomfort. The patient reports feeling weaker since he has not been as active lately.  The patient was previously prescribed gabapentin for the nerve pain but had to discontinue it due to a potential interaction with the Norco.           ROS All review of systems negative except what is listed in the HPI      Objective:    BP 126/73   Pulse 80   Ht 6' (1.829 m)   Wt 209  lb (94.8 kg)   SpO2 98%   BMI 28.35 kg/m    Physical Exam Vitals reviewed.  Constitutional:      Appearance: Normal appearance.  HENT:     Head: Normocephalic and atraumatic.  Cardiovascular:     Rate and Rhythm: Normal rate.  Pulmonary:     Effort: Pulmonary effort is normal.     Breath sounds: Normal breath sounds.  Skin:    General: Skin is warm and dry.     Comments: Scarring to right back from shingles earlier this year  Neurological:     Mental Status: He is alert.  Psychiatric:        Mood and Affect: Mood normal.        Behavior: Behavior normal.        Thought Content: Thought content normal.          No results found for any visits on 06/01/23.      Assessment & Plan:   Problem List Items Addressed This Visit   None Visit Diagnoses     Postherpetic neuralgia    -  Primary   Relevant Medications   lidocaine (LIDODERM) 5 %          Postherpetic Neuralgia Severe pain ongoing for 5 months following shingles outbreak. Currently managed with Hydrocodone/Acetaminophen 3 times daily, but pain relief is incomplete and associated with tremors when pain gets severe. Gabapentin was previously tried for a short duration but discontinued  due to potential interaction with Hydrocodone/Acetaminophen. -Add Lidocaine patches for additional pain relief. -Consider re-trial of Gabapentin if Lidocaine patches provide some relief and Hydrocodone/Acetaminophen can be reduced. -Consider physical therapy for muscle weakness and deconditioning secondary to pain and reduced activity. -Schedule follow-up appointment with Dr. Patsy Lager in 1 month to reassess pain management strategy.       Meds ordered this encounter  Medications   lidocaine (LIDODERM) 5 %    Sig: Place 1 patch onto the skin daily. Remove & Discard patch within 12 hours or as directed by MD    Dispense:  30 patch    Refill:  1    Order Specific Question:   Supervising Provider    Answer:   Bradd Canary  [[id number]]    Return in about 1 month (around 07/02/2023) for shingles pain f/u.  Clayborne Dana, NP

## 2023-06-01 NOTE — Telephone Encounter (Signed)
PA approved.   PA Case: [id number], Status: Approved, Coverage Starts on: 06/23/2022 12:00:00 AM, Coverage Ends on: 06/22/2024 12:00:00 AM. Questions? Contact [phone].

## 2023-06-01 NOTE — Telephone Encounter (Signed)
PA initiated via Covermymeds; KEY: [id number]. Awaiting determination.

## 2023-06-03 ENCOUNTER — Inpatient Hospital Stay: Payer: Medicare HMO

## 2023-06-03 ENCOUNTER — Inpatient Hospital Stay: Payer: Medicare HMO | Admitting: Hematology & Oncology

## 2023-06-04 ENCOUNTER — Encounter: Payer: Self-pay | Admitting: Hematology & Oncology

## 2023-06-04 ENCOUNTER — Inpatient Hospital Stay: Payer: Medicare HMO | Admitting: Hematology & Oncology

## 2023-06-04 ENCOUNTER — Inpatient Hospital Stay: Payer: Medicare HMO | Attending: Hematology & Oncology

## 2023-06-04 ENCOUNTER — Other Ambulatory Visit: Payer: Self-pay

## 2023-06-04 VITALS — BP 125/76 | HR 78 | Temp 97.8°F | Resp 20 | Ht 72.0 in | Wt 209.4 lb

## 2023-06-04 DIAGNOSIS — C9111 Chronic lymphocytic leukemia of B-cell type in remission: Secondary | ICD-10-CM | POA: Diagnosis not present

## 2023-06-04 DIAGNOSIS — Z79899 Other long term (current) drug therapy: Secondary | ICD-10-CM | POA: Diagnosis not present

## 2023-06-04 DIAGNOSIS — C9110 Chronic lymphocytic leukemia of B-cell type not having achieved remission: Secondary | ICD-10-CM | POA: Insufficient documentation

## 2023-06-04 LAB — CBC WITH DIFFERENTIAL (CANCER CENTER ONLY)
Abs Immature Granulocytes: 0.07 10*3/uL (ref 0.00–0.07)
Basophils Absolute: 0.1 10*3/uL (ref 0.0–0.1)
Basophils Relative: 0 %
Eosinophils Absolute: 0.2 10*3/uL (ref 0.0–0.5)
Eosinophils Relative: 1 %
HCT: 41.7 % (ref 39.0–52.0)
Hemoglobin: 14.4 g/dL (ref 13.0–17.0)
Immature Granulocytes: 0 %
Lymphocytes Relative: 63 %
Lymphs Abs: 11.6 10*3/uL — ABNORMAL HIGH (ref 0.7–4.0)
MCH: 32.6 pg (ref 26.0–34.0)
MCHC: 34.5 g/dL (ref 30.0–36.0)
MCV: 94.3 fL (ref 80.0–100.0)
Monocytes Absolute: 0.9 10*3/uL (ref 0.1–1.0)
Monocytes Relative: 5 %
Neutro Abs: 5.8 10*3/uL (ref 1.7–7.7)
Neutrophils Relative %: 31 %
Platelet Count: 244 10*3/uL (ref 150–400)
RBC: 4.42 MIL/uL (ref 4.22–5.81)
RDW: 12.6 % (ref 11.5–15.5)
Smear Review: NORMAL
WBC Count: 18.7 10*3/uL — ABNORMAL HIGH (ref 4.0–10.5)
nRBC: 0 % (ref 0.0–0.2)

## 2023-06-04 LAB — CMP (CANCER CENTER ONLY)
ALT: 18 U/L (ref 0–44)
AST: 19 U/L (ref 15–41)
Albumin: 4.2 g/dL (ref 3.5–5.0)
Alkaline Phosphatase: 57 U/L (ref 38–126)
Anion gap: 10 (ref 5–15)
BUN: 21 mg/dL (ref 8–23)
CO2: 29 mmol/L (ref 22–32)
Calcium: 9.7 mg/dL (ref 8.9–10.3)
Chloride: 103 mmol/L (ref 98–111)
Creatinine: 1.04 mg/dL (ref 0.61–1.24)
GFR, Estimated: >60 mL/min (ref >60)
Glucose, Bld: 241 mg/dL — ABNORMAL HIGH (ref 70–99)
Potassium: 4.5 mmol/L (ref 3.5–5.1)
Sodium: 142 mmol/L (ref 135–145)
Total Bilirubin: 0.7 mg/dL (ref <1.2)
Total Protein: 6.6 g/dL (ref 6.5–8.1)

## 2023-06-04 LAB — LACTATE DEHYDROGENASE: LDH: 116 U/L (ref 98–192)

## 2023-06-04 MED ORDER — FAMCICLOVIR 250 MG PO TABS: 250.0000 mg | ORAL_TABLET | Freq: Every day | ORAL | 12 refills | Status: DC

## 2023-06-04 NOTE — Progress Notes (Signed)
Hematology and Oncology Follow Up Stone  DAION Brandon Stone [id number] Jan 24, 1946 77 y.o. 06/04/2023   Principle Diagnosis:  Stage A CLL  Current Therapy:   Observation     Interim History:  [name] is back for follow-up.  We see him back yearly.  Unfortunately, since we last saw him back, he has had shingles.  He had shingles in July.  This is in the right T4-T5 dermatome.  He has had a quite a bit of pain.  He has been on different medications.  He is on a Lidoderm patch now.  I am sure that he is also on some type of narcotic.  Hopefully will be able to get off the narcotic.  I do think he is going need to have prophylactic antiviral.  I had to believe that the CLL is probably an risk factor for him.  In addition, he has diabetes.  Otherwise, he is managing.  He has not been able to exercise that much because of the pain.  He has had no problems with nausea or vomiting.  He has had no change in bowel or bladder habits.  He has had no cough.  Overall, I would say his performance status is probably ECOG 1.      Medications:  Current Outpatient Medications:    acetaminophen (TYLENOL) 325 MG tablet, Take 325 mg by mouth every 6 (six) hours as needed for mild pain., Disp: , Rfl:    allopurinol (ZYLOPRIM) 300 MG tablet, Take 1 tablet (300 mg total) by mouth daily., Disp: 90 tablet, Rfl: 1   amLODipine (NORVASC) 10 MG tablet, TAKE 1 TABLET EVERY DAY, Disp: 90 tablet, Rfl: 3   aspirin EC 81 MG tablet, Take 81 mg by mouth daily., Disp: , Rfl:    atorvastatin (LIPITOR) 20 MG tablet, Take 1 tablet (20 mg total) by mouth daily., Disp: 90 tablet, Rfl: 3   Evolocumab (REPATHA SURECLICK) 140 MG/ML SOAJ, INJECT 1 PEN INTO THE SKIN EVERY 14 (FOURTEEN) DAYS., Disp: 6 mL, Rfl: 3   HYDROcodone-acetaminophen (NORCO/VICODIN) 5-325 MG tablet, Take 1-2 tablets by mouth every 8 (eight) hours as needed., Disp: 45 tablet, Rfl: 0   icosapent Ethyl (VASCEPA) 1 g capsule, TAKE 2 CAPSULES BY MOUTH TWICE A DAY,  Disp: 360 capsule, Rfl: 2   isosorbide mononitrate (IMDUR) 60 MG 24 hr tablet, TAKE 1 TABLET EVERY DAY, Disp: 90 tablet, Rfl: 3   lidocaine (LIDODERM) 5 %, Place 1 patch onto the skin daily. Remove & Discard patch within 12 hours or as directed by MD, Disp: 30 patch, Rfl: 1   metFORMIN (GLUCOPHAGE) 1000 MG tablet, Take 1 tablet (1,000 mg total) by mouth 2 (two) times daily with a meal., Disp: 180 tablet, Rfl: 3   metoprolol succinate (TOPROL-XL) 25 MG 24 hr tablet, TAKE 1 TABLET IN THE MORNING AND AT BEDTIME, Disp: 180 tablet, Rfl: 3   MULTIPLE VITAMIN-FOLIC ACID PO, Take 1 tablet by mouth daily. Contains 400 mg folic acid, Disp: , Rfl:    omeprazole (PRILOSEC) 20 MG capsule, TAKE 1 CAPSULE TWICE DAILY, Disp: 180 capsule, Rfl: 3   potassium chloride SA (KLOR-CON M) 20 MEQ tablet, Take 1 tablet (20 mEq total) by mouth daily., Disp: 90 tablet, Rfl: 1   tamsulosin (FLOMAX) 0.4 MG CAPS capsule, Take 1 capsule (0.4 mg total) by mouth daily., Disp: 90 capsule, Rfl: 3   TRUE METRIX BLOOD GLUCOSE TEST test strip, TEST AS DIRECTED DAILY, Disp: 100 strip, Rfl: 3   TRUEplus Lancets  33G MISC, USE AS DIRECTED ONCE A DAY., Disp: 100 each, Rfl: 3   valsartan-hydrochlorothiazide (DIOVAN-HCT) 160-25 MG tablet, TAKE 1 TABLET EVERY DAY, Disp: 90 tablet, Rfl: 3   loratadine (CLARITIN) 10 MG tablet, Take 10 mg by mouth daily as needed for allergies. (Patient not taking: Reported on 06/04/2023), Disp: , Rfl:    nitroGLYCERIN (NITROSTAT) 0.4 MG SL tablet, Place 1 tablet (0.4 mg total) under the tongue every 5 (five) minutes as needed for chest pain. (Patient not taking: Reported on 06/04/2023), Disp: 25 tablet, Rfl: 3   triamcinolone cream (KENALOG) 0.1 %, Apply 1 application topically 2 (two) times daily. Use as needed for dry or itchy skin (Patient not taking: Reported on 06/04/2023), Disp: 30 g, Rfl: 2  Allergies:  Allergies  Allergen Reactions   Black Pepper [Piper] Palpitations   Codeine Palpitations and Other  (See Comments)    Wild dreams, palpitations   Other Hives and Rash    opsite and tegaderm tape.   Rosuvastatin Nausea Only   Tape Rash    Blisters from Tegaderm and tape with orange backing(opsite?)   Ticlopidine Hcl Hives and Itching    Past Medical History, Surgical history, Social history, and Family History were reviewed and updated.  Review of Systems: Review of Systems  Constitutional: Negative.   HENT:  Negative.    Eyes: Negative.   Respiratory: Negative.    Cardiovascular: Negative.   Gastrointestinal: Negative.   Endocrine: Negative.   Genitourinary: Negative.    Musculoskeletal: Negative.   Skin: Negative.   Neurological: Negative.   Hematological: Negative.   Psychiatric/Behavioral: Negative.      Physical Exam:  height is 6' (1.829 m) and weight is 209 lb 6.4 oz (95 kg). His oral temperature is 97.8 F (36.6 C). His blood pressure is 125/76 and his pulse is 78. His respiration is 20 and oxygen saturation is 97%.   Wt Readings from Last 3 Encounters:  06/04/23 209 lb 6.4 oz (95 kg)  06/01/23 209 lb (94.8 kg)  04/01/23 203 lb 3.2 oz (92.2 kg)    Physical Exam Vitals reviewed.  HENT:     Head: Normocephalic and atraumatic.  Eyes:     Pupils: Pupils are equal, round, and reactive to light.  Cardiovascular:     Rate and Rhythm: Normal rate and regular rhythm.     Heart sounds: Normal heart sounds.  Pulmonary:     Effort: Pulmonary effort is normal.     Breath sounds: Normal breath sounds.  Abdominal:     General: Bowel sounds are normal.     Palpations: Abdomen is soft.  Musculoskeletal:        General: No tenderness or deformity. Normal range of motion.     Cervical back: Normal range of motion.  Lymphadenopathy:     Cervical: No cervical adenopathy.  Skin:    General: Skin is warm and dry.     Findings: No erythema or rash.     Comments: Skin exam does show the shingles rash in the right T4-T5 dermatome.  Neurological:     Mental Status: He is  alert and oriented to person, place, and time.  Psychiatric:        Behavior: Behavior normal.        Thought Content: Thought content normal.        Judgment: Judgment normal.      Lab Results  Component Value Date   WBC 18.7 (H) 06/04/2023   HGB 14.4 06/04/2023  HCT 41.7 06/04/2023   MCV 94.3 06/04/2023   PLT 244 06/04/2023     Chemistry      Component Value Date/Time   NA 142 06/04/2023 1239   NA 143 06/01/2020 0900   NA 143 06/11/2017 1044   NA 140 04/27/2014 0815   K 4.5 06/04/2023 1239   K 3.2 (L) 06/11/2017 1044   K 3.7 04/27/2014 0815   CL 103 06/04/2023 1239   CL 101 06/11/2017 1044   CO2 29 06/04/2023 1239   CO2 28 06/11/2017 1044   CO2 26 04/27/2014 0815   BUN 21 06/04/2023 1239   BUN 18 06/01/2020 0900   BUN 17 06/11/2017 1044   BUN 18.5 04/27/2014 0815   CREATININE 1.04 06/04/2023 1239   CREATININE 1.1 06/11/2017 1044   CREATININE 1.2 04/27/2014 0815      Component Value Date/Time   CALCIUM 9.7 06/04/2023 1239   CALCIUM 9.7 06/11/2017 1044   CALCIUM 9.8 04/27/2014 0815   ALKPHOS 57 06/04/2023 1239   ALKPHOS 83 06/11/2017 1044   ALKPHOS 71 04/27/2014 0815   AST 19 06/04/2023 1239   AST 18 04/27/2014 0815   ALT 18 06/04/2023 1239   ALT 32 06/11/2017 1044   ALT 25 04/27/2014 0815   BILITOT 0.7 06/04/2023 1239   BILITOT 0.67 04/27/2014 0815       Impression and Plan: Mr. Cossin is a 77 year old white male.  He has stage A CLL.  He has minimal lymphocytosis.    His white cell count is higher today.  However, this certainly could be from having the shingles.  Again, we will put him on Famvir.  I think this would be reasonable for prophylactic for him.  I am going to have to probably get him back sooner than 1 year now.  We will see back in him back in 6 months.    Josph Macho, MD 12/12/20241:56 PM

## 2023-06-05 MED ORDER — HYDROCODONE-ACETAMINOPHEN 5-325 MG PO TABS: 1.0000 | ORAL_TABLET | Freq: Three times a day (TID) | ORAL | 0 refills | Status: DC | PRN

## 2023-06-05 NOTE — Addendum Note (Signed)
Addended by: Abbe Amsterdam C on: 06/05/2023 09:30 AM   Modules accepted: Orders

## 2023-06-08 ENCOUNTER — Other Ambulatory Visit: Payer: Self-pay

## 2023-06-08 MED ORDER — ATORVASTATIN CALCIUM 20 MG PO TABS: 20.0000 mg | ORAL_TABLET | Freq: Every day | ORAL | 1 refills | Status: DC

## 2023-06-10 ENCOUNTER — Encounter: Payer: Self-pay | Admitting: Family Medicine

## 2023-06-10 NOTE — Telephone Encounter (Signed)
 Care team updated and letter sent for eye exam notes.

## 2023-06-22 MED ORDER — FAMCICLOVIR 250 MG PO TABS: 250.0000 mg | ORAL_TABLET | Freq: Every day | ORAL | 12 refills | Status: DC

## 2023-06-22 NOTE — Addendum Note (Signed)
Addended by: Abbe Amsterdam C on: 06/22/2023 01:46 PM   Modules accepted: Orders

## 2023-06-25 ENCOUNTER — Telehealth: Payer: Self-pay

## 2023-06-25 NOTE — Telephone Encounter (Signed)
 Copied from CRM [id number]. Topic: Clinical - Medical Advice >> Jun 25, 2023  8:27 AM Isabell A wrote: Reason for CRM: Patient states he has whatever is going around, experiencing congestion, coughing with gagging.Several months back he got prescribed a cough medicine & has been taking that. Patient would like an antibiotic to be called in.

## 2023-06-25 NOTE — Telephone Encounter (Signed)
 Spoke w/ Pt- informed Pt of PCP request. He verbalized understanding.

## 2023-06-25 NOTE — Telephone Encounter (Signed)
 Pt has an appt on 07/06/23

## 2023-06-26 ENCOUNTER — Encounter: Payer: Self-pay | Admitting: Family Medicine

## 2023-06-30 NOTE — Progress Notes (Signed)
 [facility] [address], [address] [place], [place] [address] [phone]  Date:  07/01/2023   Name:  Brandon Stone   DOB:  1945-09-04   MRN:  [id number]  PCP:  Watt Harlene BROCKS, MD    Chief Complaint: 1 month f/u (Seen on 05/31/23 by Waddell: Postherpetic Neuralgia)   History of Present Illness:  Brandon Stone is a 78 y.o. very pleasant male patient who presents with the following:  Patient seen today for follow-up of recent shingles outbreak-most recent visit with myself was in October He first came down with shingles at the end of August, unfortunately he had severe pain and required narcotic pain control for a few months.  However he has been able to wean down his medication, I last filled hydrocodone  No. 40 5 pills on December 13 He is now taking 1/2 tab every 8 hours; he has tried to stretch this out further but has not been able to do so as of yet   History of diabetes, CLL, hyperlipidemia, hypertension, CAD status post CABG 2004 and 1991, subdural hematoma March 2020.  He has an MI in 1998 and had a stent    Extensive CV disease history summarized by Dr Okey in her recent note:  Brandon Stone is a 78 y.o. male with a history of CAD (s/p CABG in 1999; IWMI in 1998.  Redo CABG in 2004 (SVG to OM/LCx; LRA to PDA).  Cath in July 2013 due to increased CP showed severe LM disease into LCx.; SVG to OM 100%; LIMA to Diag and LAD patent; Radial Artery to PDA patent   LVEF 45%  Plan was to continue medical Rx.  Myoview  in 2016 showed scar but no ischemia   2018    Echo showed LVEF normal  Mild diastolic dysfunciton.  Due for foot exam- do today  Eye exam- yes, per Dr Erasmo- they plan to do cataract surgery next month Flu shot- pt declines, he may get at a later date Recommend COVID booster A1c up-to-date, diabetes well-controlled with metformin -recent A1c as below Lab Results  Component Value Date   HGBA1C 7.2 (H) 05/25/2023    Patient has been able to taper down his hydrocodone  use as above.  He wonders if going back on gabapentin  might be helpful at this time.  We tried gabapentin  earlier in his course of shingles but at that time his pain was so severe it did not help.  I will have him try tapering back onto 300 mg 3 times daily over the course of 3 to 4 days, he will let me know how this works for him.  Advised I can increase the dose of the gabapentin  if it seems to be helping  He also notes recurrence of onychomycosis on his great toenails, on the left medial nail fold there is some pain because the nail has crumbling broken off.  He would like to use terbinafine  again if possible.  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 CABG 1991;  b.  IWMI  1998;  c.  S/P PTCA  stents to SVG ot OM last in 2003;  d.  Redo CABG x 2 in 2004 (VG->OM->LCX, LRA->PDA);  e. 12/2011 Cath: 3vd, sev LM into LCX dzs, VG->OM 100, LIMA->DIAG->LAD patent, Rad Art->PDA patent, EF 45%, Med Rx.   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)    Myocardial infarction (HCC)    at age 65    Psoriasis    Rheumatic fever    as a child   S/P coronary artery stent placement 1998   TIA (transient ischemic attack)    FOUND ON MRI  PER PATIENT-     NO PROBLEM    Past Surgical History:  Procedure Laterality Date   APPENDECTOMY     CHOLECYSTECTOMY N/A 09/18/2016   Procedure: LAPAROSCOPIC CHOLECYSTECTOMY WITH INTRAOPERATIVE CHOLANGIOGRAM;  Surgeon: Krystal Russell, MD;  Location: [facility] OR;  Service: General;  Laterality: N/A;   COLONOSCOPY  07/2013   COLONOSCOPY WITH PROPOFOL   07/21/2016   Dr.Armbruster   CORONARY ANGIOPLASTY     STENTS   CORONARY ARTERY BYPASS GRAFT     1991 (LIMA to LAD/Diag;  SVG to RCA;  SVG to OM)   CORONARY ARTERY BYPASS GRAFT     2004 (L radial to PDA; SVG to OM1/ distal LCx)   POLYPECTOMY     TONSILLECTOMY AND ADENOIDECTOMY     UMBILICAL HERNIA REPAIR      Social History   Tobacco Use   Smoking status: Former    Current packs/day: 0.00    Average packs/day: 0.5 packs/day for 8.0 years (4.0 ttl pk-yrs)    Types: Cigarettes    Start date: 06/24/1963    Quit date: 06/24/1971    Years since quitting: 52.0   Smokeless tobacco: Never  Vaping Use   Vaping status: Never Used  Substance Use Topics   Alcohol use: No   Drug use: No    Family History  Problem Relation Age of Onset   Heart disease Mother    Hypertension Mother    Hyperlipidemia Mother    Diabetes Mother    Skin cancer Mother        skin   Heart disease Father    Hypertension Father    Hyperlipidemia Father    Diabetes Father    Stroke Sister    Stroke Brother    Colon cancer Neg Hx    Esophageal cancer Neg Hx    Stomach cancer Neg Hx    Rectal cancer Neg Hx     Allergies  Allergen Reactions   Black Pepper [Piper] Palpitations   Codeine Palpitations and Other (See Comments)    Wild dreams, palpitations   Other Hives and Rash    opsite and tegaderm tape.   Rosuvastatin  Nausea Only   Tape Rash    Blisters from Tegaderm and tape with orange backing(opsite?)   Ticlopidine  Hcl Hives and Itching    Medication list has been reviewed  and updated.  Current Outpatient Medications on File Prior to Visit  Medication Sig Dispense Refill   acetaminophen  (TYLENOL ) 325 MG tablet Take 325 mg by mouth every 6 (six) hours as needed for mild pain.     allopurinol  (ZYLOPRIM ) 300 MG tablet Take 1 tablet (300 mg total) by mouth daily. 90 tablet 1   amLODipine  (NORVASC ) 10 MG tablet TAKE 1 TABLET EVERY DAY 90 tablet 3   aspirin  EC 81 MG tablet Take 81 mg by mouth daily.     atorvastatin  (LIPITOR) 20 MG tablet Take 1 tablet (20 mg total) by mouth daily. 90 tablet 1   Evolocumab  (REPATHA  SURECLICK) 140 MG/ML SOAJ INJECT 1 PEN INTO THE SKIN EVERY 14 (FOURTEEN) DAYS. 6 mL 3   famciclovir  (FAMVIR ) 250 MG tablet Take 1 tablet (250 mg total) by mouth daily. 30 tablet 12   HYDROcodone -acetaminophen  (NORCO/VICODIN) 5-325 MG tablet Take 1-2 tablets by mouth every 8 (eight) hours as needed. 45 tablet 0   icosapent  Ethyl (VASCEPA ) 1 g capsule TAKE 2 CAPSULES BY MOUTH TWICE A DAY 360 capsule 2   isosorbide  mononitrate (IMDUR ) 60 MG 24 hr tablet TAKE 1 TABLET EVERY DAY 90 tablet 3   loratadine (CLARITIN) 10 MG tablet Take 10 mg by mouth daily as needed for allergies.     metFORMIN  (GLUCOPHAGE ) 1000 MG tablet Take 1 tablet (1,000 mg total) by mouth 2 (two) times daily with a meal. 180 tablet 3   metoprolol  succinate (TOPROL -XL) 25 MG 24 hr tablet TAKE 1 TABLET IN THE MORNING AND AT BEDTIME 180 tablet 3   MULTIPLE VITAMIN-FOLIC ACID PO Take 1 tablet by mouth daily. Contains 400 mg folic acid     nitroGLYCERIN  (NITROSTAT ) 0.4 MG SL tablet Place 1 tablet (0.4 mg total) under the tongue every 5 (five) minutes as needed for chest pain. 25 tablet 3   omeprazole  (PRILOSEC) 20 MG capsule TAKE 1 CAPSULE TWICE DAILY 180 capsule 3   potassium chloride  SA (KLOR-CON  M) 20 MEQ tablet Take 1 tablet (20 mEq total) by mouth daily. 90 tablet 1   tamsulosin  (FLOMAX ) 0.4 MG CAPS capsule Take 1 capsule (0.4 mg  total) by mouth daily. 90 capsule 3   triamcinolone  cream (KENALOG ) 0.1 % Apply 1 application topically 2 (two) times daily. Use as needed for dry or itchy skin 30 g 2   TRUE METRIX BLOOD GLUCOSE TEST test strip TEST AS DIRECTED DAILY 100 strip 3   TRUEplus Lancets 33G MISC USE AS DIRECTED ONCE A DAY. 100 each 3   valsartan -hydrochlorothiazide  (DIOVAN -HCT) 160-25 MG tablet TAKE 1 TABLET EVERY DAY 90 tablet 3   lidocaine  (LIDODERM ) 5 % Place 1 patch onto the skin daily. Remove & Discard patch within 12 hours or as directed by MD (Patient not taking: Reported on 07/01/2023) 30 patch 1   No current facility-administered medications on file prior to visit.    Review of Systems:  As per HPI- otherwise negative.   Physical Examination: Vitals:   07/01/23 1025  BP: 130/60  Pulse: 74  Resp: 18  Temp: 98.5 F (36.9 C)  SpO2: 97%   Vitals:   07/01/23 1025  Weight: 200 lb (90.7 kg)  Height: 6' (1.829 m)   Body mass index is 27.12 kg/m. Ideal Body Weight: Weight in (lb) to have BMI = 25: 183.9  GEN: no acute distress.  Central obesity, looks well HEENT: Atraumatic, Normocephalic.  Ears and Nose: No external deformity. CV: RRR, No M/G/R. No JVD. No thrill. No  extra heart sounds. PULM: CTA B, no wheezes, crackles, rhonchi. No retractions. No resp. distress. No accessory muscle use. Status post CABG surgery EXTR: No c/c/e PSYCH: Normally interactive. Conversant.  Varicella-zoster rash is now just post-inflammatory hyperpigmentation as pictured below  Foot exam- onychomycosis of left great toenail, slight on right great toenail    Assessment and Plan: Hyperlipidemia, unspecified hyperlipidemia type  HYPERTENSION, BENIGN  Herpes zoster with other complication - Plan: HYDROcodone -acetaminophen  (NORCO/VICODIN) 5-325 MG tablet  Onychomycosis - Plan: terbinafine  (LAMISIL ) 250 MG tablet  Patient seen today for follow-up.  He is compliant with his lipid-lowering medication-Repatha ,  Vascepa , atorvastatin  Blood pressure well-controlled on current regimen Refilled hydrocodone .  He has gabapentin  on hand and will try taking 300 mg 3 times daily.  He will let me know how this works for him Check back in 3 to 4 months if he is still having shingles pain- otherwise 6 months ok   Will do another 12 weeks of terbinafine  for symptomatic onychomycosis Signed Harlene Schroeder, MD

## 2023-07-01 ENCOUNTER — Ambulatory Visit (INDEPENDENT_AMBULATORY_CARE_PROVIDER_SITE_OTHER): Payer: Medicare HMO | Admitting: Family Medicine

## 2023-07-01 VITALS — BP 130/60 | HR 74 | Temp 98.5°F | Resp 18 | Ht 72.0 in | Wt 200.0 lb

## 2023-07-01 DIAGNOSIS — B028 Zoster with other complications: Secondary | ICD-10-CM

## 2023-07-01 DIAGNOSIS — I10 Essential (primary) hypertension: Secondary | ICD-10-CM | POA: Diagnosis not present

## 2023-07-01 DIAGNOSIS — B351 Tinea unguium: Secondary | ICD-10-CM

## 2023-07-01 DIAGNOSIS — E785 Hyperlipidemia, unspecified: Secondary | ICD-10-CM

## 2023-07-01 MED ORDER — HYDROCODONE-ACETAMINOPHEN 5-325 MG PO TABS: 1.0000 | ORAL_TABLET | Freq: Three times a day (TID) | ORAL | 0 refills | Status: DC | PRN

## 2023-07-01 MED ORDER — TERBINAFINE HCL 250 MG PO TABS: 250.0000 mg | ORAL_TABLET | Freq: Every day | ORAL | 0 refills | Status: DC

## 2023-07-01 NOTE — Patient Instructions (Addendum)
 It was good to see you today, I am glad you are making progress but I am sorry you are still dealing with shingles pain  Continue to use the lower dose of hydrocodone  as needed for pain control.  If you want to try going back on gabapentin  now I think it may help you.  As we discussed, increase to 300 mg 3 times a day over the course of a few days.-If not helpful you do not have to continue taking it, but I think worth a try  We can have you use terbinafine  once daily for 12 weeks for toenail fungus  Blood pressure looks good  Please see me in 3 or 4 months for follow-up if you are still having shingles pain-hopefully it will be cleared up soon!

## 2023-07-06 ENCOUNTER — Ambulatory Visit: Payer: Medicare HMO | Admitting: Family Medicine

## 2023-08-10 ENCOUNTER — Encounter: Payer: Self-pay | Admitting: Family Medicine

## 2023-08-10 DIAGNOSIS — B028 Zoster with other complications: Secondary | ICD-10-CM

## 2023-08-11 MED ORDER — HYDROCODONE-ACETAMINOPHEN 5-325 MG PO TABS: 0.5000 | ORAL_TABLET | Freq: Three times a day (TID) | ORAL | 0 refills | Status: DC | PRN

## 2023-08-20 ENCOUNTER — Telehealth: Payer: Self-pay

## 2023-08-20 DIAGNOSIS — H2513 Age-related nuclear cataract, bilateral: Secondary | ICD-10-CM | POA: Diagnosis not present

## 2023-08-20 DIAGNOSIS — H25043 Posterior subcapsular polar age-related cataract, bilateral: Secondary | ICD-10-CM | POA: Diagnosis not present

## 2023-08-20 DIAGNOSIS — H18413 Arcus senilis, bilateral: Secondary | ICD-10-CM | POA: Diagnosis not present

## 2023-08-20 DIAGNOSIS — H25013 Cortical age-related cataract, bilateral: Secondary | ICD-10-CM | POA: Diagnosis not present

## 2023-08-20 DIAGNOSIS — H2511 Age-related nuclear cataract, right eye: Secondary | ICD-10-CM | POA: Diagnosis not present

## 2023-08-20 NOTE — Telephone Encounter (Signed)
   Patient Name: Brandon Stone  DOB: January 06, 1946 MRN: [id number]  Primary Cardiologist: Dietrich Pates, MD  Chart reviewed as part of pre-operative protocol coverage. Cataract extractions are recognized in guidelines as low risk surgeries that do not typically require specific preoperative testing or holding of blood thinner therapy. Therefore, given past medical history and time since last visit, based on ACC/AHA guidelines, Brandon Stone would be at acceptable risk for the planned procedure without further cardiovascular testing.   I will route this recommendation to the requesting party via Epic fax function and remove from pre-op pool.  Please call with questions.  Joylene Grapes, NP 08/20/2023, 11:56 AM

## 2023-08-20 NOTE — Telephone Encounter (Signed)
   Pre-operative Risk Assessment    Patient Name: Brandon Stone  DOB: 05-19-1946 MRN: [id number]   Date of last office visit: 12/17/22 Dr Tenny Craw Date of next office visit: none   Request for Surgical Clearance    Procedure:   Cataract extraction w/ intraocular lens implantation of the right eye/ left eye  Date of Surgery:  Clearance 10/26/23  11/23/23                             Surgeon:  Dr Mia Creek Surgeon's Group or Practice Name:  [facility]  [facility] Phone number:  [phone] Fax number:  [phone]   Type of Clearance Requested:   - Medical  - Pharmacy:  Hold Aspirin Not indicated   Type of Anesthesia:  Not Indicated   Additional requests/questions:    Signed, Esther Bradstreet   08/20/2023, 11:29 AM

## 2023-08-31 ENCOUNTER — Encounter: Payer: Self-pay | Admitting: Family Medicine

## 2023-09-14 ENCOUNTER — Encounter: Payer: Self-pay | Admitting: Family Medicine

## 2023-09-28 ENCOUNTER — Encounter: Payer: Self-pay | Admitting: Family Medicine

## 2023-09-28 DIAGNOSIS — B028 Zoster with other complications: Secondary | ICD-10-CM

## 2023-09-28 MED ORDER — GABAPENTIN 300 MG PO CAPS: 300.0000 mg | ORAL_CAPSULE | Freq: Three times a day (TID) | ORAL | 1 refills | Status: DC

## 2023-09-28 NOTE — Addendum Note (Signed)
 Addended by: Abbe Amsterdam C on: 09/28/2023 03:21 PM   Modules accepted: Orders

## 2023-10-21 ENCOUNTER — Other Ambulatory Visit: Payer: Self-pay | Admitting: Internal Medicine

## 2023-10-21 DIAGNOSIS — J3489 Other specified disorders of nose and nasal sinuses: Secondary | ICD-10-CM | POA: Diagnosis not present

## 2023-10-21 DIAGNOSIS — R509 Fever, unspecified: Secondary | ICD-10-CM | POA: Diagnosis not present

## 2023-10-21 DIAGNOSIS — R070 Pain in throat: Secondary | ICD-10-CM | POA: Diagnosis not present

## 2023-10-28 ENCOUNTER — Encounter (HOSPITAL_COMMUNITY): Payer: Self-pay

## 2023-11-04 ENCOUNTER — Ambulatory Visit (INDEPENDENT_AMBULATORY_CARE_PROVIDER_SITE_OTHER)

## 2023-11-04 VITALS — BP 130/60 | Ht 72.0 in | Wt 208.0 lb

## 2023-11-04 DIAGNOSIS — Z2821 Immunization not carried out because of patient refusal: Secondary | ICD-10-CM

## 2023-11-04 DIAGNOSIS — Z0000 Encounter for general adult medical examination without abnormal findings: Secondary | ICD-10-CM | POA: Diagnosis not present

## 2023-11-04 NOTE — Patient Instructions (Signed)
 Brandon Stone , Thank you for taking time out of your busy schedule to complete your Annual Wellness Visit with me. I enjoyed our conversation and look forward to speaking with you again next year. I, as well as your care team,  appreciate your ongoing commitment to your health goals. Please review the following plan we discussed and let me know if I can assist you in the future. Your Game plan/ To Do List    Referrals: If you haven't heard from the office you've been referred to, please reach out to them at the phone provided.   Follow up Visits: Next Medicare AWV with our clinical staff: 11/09/2024   Have you seen your provider in the last 6 months (3 months if uncontrolled diabetes)? Yes Next Office Visit with your provider: n/a  Clinician Recommendations:  Aim for 30 minutes of exercise or brisk walking, 6-8 glasses of water, and 5 servings of fruits and vegetables each day.       This is a list of the screening recommended for you and due dates:  Health Maintenance  Topic Date Due   COVID-19 Vaccine (1) Never done   Zoster (Shingles) Vaccine (1 of 2) Never done   Eye exam for diabetics  01/21/2023   Hemoglobin A1C  11/23/2023   Yearly kidney health urinalysis for diabetes  12/15/2023   Flu Shot  01/22/2024   Yearly kidney function blood test for diabetes  06/03/2024   Complete foot exam   06/30/2024   Medicare Annual Wellness Visit  11/03/2024   DTaP/Tdap/Td vaccine (2 - Td or Tdap) 12/20/2026   Colon Cancer Screening  02/28/2032   Pneumonia Vaccine  Completed   Hepatitis C Screening  Completed   HPV Vaccine  Aged Out   Meningitis B Vaccine  Aged Out    Advanced directives: (Declined) Advance directive discussed with you today. Even though you declined this today, please call our office should you change your mind, and we can give you the proper paperwork for you to fill out. Advance Care Planning is important because it:  [x]  Makes sure you receive the medical care that is  consistent with your values, goals, and preferences  [x]  It provides guidance to your family and loved ones and reduces their decisional burden about whether or not they are making the right decisions based on your wishes.  Follow the link provided in your after visit summary or read over the paperwork we have mailed to you to help you started getting your Advance Directives in place. If you need assistance in completing these, please reach out to us  so that we can help you!  See attachments for Preventive Care and Fall Prevention Tips.

## 2023-11-04 NOTE — Progress Notes (Signed)
 And  Because this visit was a virtual/telehealth visit,  certain criteria was not obtained, such a blood pressure, CBG if applicable, and timed get up and go. Any medications not marked as "taking" were not mentioned during the medication reconciliation part of the visit. Any vitals not documented were not able to be obtained due to this being a telehealth visit or patient was unable to self-report a recent blood pressure reading due to a lack of equipment at home via telehealth. Vitals that have been documented are verbally provided by the patient.  This visit was performed by a medical professional under my direct supervision. I was immediately available for consultation/collaboration. I have reviewed and agree with the Annual Wellness Visit documentation.  Subjective:   Brandon Stone is a 78 y.o. who presents for a Medicare Wellness preventive visit.  As a reminder, Annual Wellness Visits don't include a physical exam, and some assessments may be limited, especially if this visit is performed virtually. We may recommend an in-person visit if needed.  Visit Complete: Virtual I connected with  [name] on 11/04/23 by a audio enabled telemedicine application and verified that I am speaking with the correct person using two identifiers.  Patient Location: Home  Provider Location: Home Office  I discussed the limitations of evaluation and management by telemedicine. The patient expressed understanding and agreed to proceed.  Vital Signs: Because this visit was a virtual/telehealth visit, some criteria may be missing or patient reported. Any vitals not documented were not able to be obtained and vitals that have been documented are patient reported.  VideoDeclined- This patient declined Librarian, academic. Therefore the visit was completed with audio only.  Persons Participating in Visit: Patient.  AWV Questionnaire: No: Patient Medicare AWV questionnaire was  not completed prior to this visit.  Cardiac Risk Factors include: advanced age (>75men, >103 women);diabetes mellitus;hypertension     Objective:     Today's Vitals   11/04/23 1443  BP: 130/60  Weight: 208 lb (94.3 kg)  Height: 6' (1.829 m)   Body mass index is 28.21 kg/m.     11/04/2023    2:52 PM 06/04/2023    1:26 PM 03/12/2023    6:59 PM 10/21/2022   10:19 AM 06/02/2022   11:22 AM 12/02/2021   11:09 AM 10/17/2021    1:14 PM  Advanced Directives  Does Patient Have a Medical Advance Directive? Yes Yes No;Yes Yes Yes Yes Yes  Type of Estate agent of [place];Living will Living will;Healthcare Power of [employer] Power of [place];Living will Living will Living will;Healthcare Power of [employer] Power of [place];Living will Living will  Does patient want to make changes to medical advance directive? No - Patient declined  No - Patient declined  No - Patient declined    Copy of Healthcare Power of Attorney in Chart? No - copy requested No - copy requested No - copy requested  No - copy requested  No - copy requested    Current Medications (verified) Outpatient Encounter Medications as of 11/04/2023  Medication Sig   acetaminophen  (TYLENOL ) 325 MG tablet Take 325 mg by mouth every 6 (six) hours as needed for mild pain.   allopurinol  (ZYLOPRIM ) 300 MG tablet Take 1 tablet (300 mg total) by mouth daily.   amLODipine  (NORVASC ) 10 MG tablet TAKE 1 TABLET EVERY DAY   aspirin  EC 81 MG tablet Take 81 mg by mouth daily.   atorvastatin  (LIPITOR) 20 MG tablet Take 1  tablet (20 mg total) by mouth daily.   Evolocumab  (REPATHA  SURECLICK) 140 MG/ML SOAJ INJECT 1 PEN INTO THE SKIN EVERY 14 (FOURTEEN) DAYS.   famciclovir  (FAMVIR ) 250 MG tablet Take 1 tablet (250 mg total) by mouth daily.   gabapentin  (NEURONTIN ) 300 MG capsule Take 1 capsule (300 mg total) by mouth 3 (three) times daily.   HYDROcodone -acetaminophen  (NORCO/VICODIN) 5-325 MG tablet Take 0.5  tablets by mouth every 8 (eight) hours as needed.   icosapent  Ethyl (VASCEPA ) 1 g capsule TAKE 2 CAPSULES BY MOUTH TWICE A DAY   isosorbide  mononitrate (IMDUR ) 60 MG 24 hr tablet TAKE 1 TABLET EVERY DAY   lidocaine  (LIDODERM ) 5 % Place 1 patch onto the skin daily. Remove & Discard patch within 12 hours or as directed by MD   loratadine (CLARITIN) 10 MG tablet Take 10 mg by mouth daily as needed for allergies.   metFORMIN  (GLUCOPHAGE ) 1000 MG tablet Take 1 tablet (1,000 mg total) by mouth 2 (two) times daily with a meal.   metoprolol  succinate (TOPROL -XL) 25 MG 24 hr tablet TAKE 1 TABLET IN THE MORNING AND AT BEDTIME   MULTIPLE VITAMIN-FOLIC ACID PO Take 1 tablet by mouth daily. Contains 400 mg folic acid   nitroGLYCERIN  (NITROSTAT ) 0.4 MG SL tablet Place 1 tablet (0.4 mg total) under the tongue every 5 (five) minutes as needed for chest pain.   omeprazole  (PRILOSEC) 20 MG capsule TAKE 1 CAPSULE TWICE DAILY   potassium chloride  SA (KLOR-CON  M) 20 MEQ tablet Take 1 tablet (20 mEq total) by mouth daily.   tamsulosin  (FLOMAX ) 0.4 MG CAPS capsule Take 1 capsule (0.4 mg total) by mouth daily.   triamcinolone  cream (KENALOG ) 0.1 % Apply 1 application topically 2 (two) times daily. Use as needed for dry or itchy skin   TRUE METRIX BLOOD GLUCOSE TEST test strip TEST AS DIRECTED DAILY   TRUEplus Lancets 33G MISC USE AS DIRECTED ONCE A DAY.   valsartan -hydrochlorothiazide  (DIOVAN -HCT) 160-25 MG tablet Take 1 tablet by mouth daily. Please call [phone] to schedule an appointment for future refills. Thank you. 1st attempt.   terbinafine  (LAMISIL ) 250 MG tablet Take 1 tablet (250 mg total) by mouth daily. Take for 3 months for toenail fungus   No facility-administered encounter medications on file as of 11/04/2023.    Allergies (verified) Black pepper [piper], Codeine, Other, Rosuvastatin , Tape, and Ticlopidine hcl   History: Past Medical History:  Diagnosis Date   Anginal pain (HCC)    Arthritis     Barrett's esophagus    BPH (benign prostatic hypertrophy)    CAD (coronary artery disease)     a. CABG 1991;  b.  IWMI  1998;  c.  S/P PTCA  stents to SVG ot OM last in 2003;  d.  Redo CABG x 2 in 2004 (VG->OM->LCX, LRA->PDA);  e. 12/2011 Cath: 3vd, sev LM into LCX dzs, VG->OM 100, LIMA->DIAG->LAD patent, Rad Art->PDA patent, EF 45%, Med Rx.   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)    Myocardial infarction (HCC)    at age 36   Psoriasis    Rheumatic fever    as a child   S/P coronary artery stent placement 1998  TIA (transient ischemic attack)    FOUND ON MRI  PER PATIENT-     NO PROBLEM   Past Surgical History:  Procedure Laterality Date   APPENDECTOMY     CHOLECYSTECTOMY N/A 09/18/2016   Procedure: LAPAROSCOPIC CHOLECYSTECTOMY WITH INTRAOPERATIVE CHOLANGIOGRAM;  Surgeon: Adalberto Hollow, MD;  Location: [facility] OR;  Service: General;  Laterality: N/A;   COLONOSCOPY  07/2013   COLONOSCOPY WITH PROPOFOL   07/21/2016   Dr.Armbruster   CORONARY ANGIOPLASTY     STENTS   CORONARY ARTERY BYPASS GRAFT     1991 (LIMA to LAD/Diag;  SVG to RCA;  SVG to OM)   CORONARY ARTERY BYPASS GRAFT     2004 (L radial to PDA; SVG to OM1/ distal LCx)   POLYPECTOMY     TONSILLECTOMY AND ADENOIDECTOMY     UMBILICAL HERNIA REPAIR     Family History  Problem Relation Age of Onset   Heart disease Mother    Hypertension Mother    Hyperlipidemia Mother    Diabetes Mother    Skin cancer Mother        skin   Heart disease Father    Hypertension Father    Hyperlipidemia Father    Diabetes Father    Stroke Sister    Stroke Brother    Colon cancer Neg Hx    Esophageal cancer Neg Hx    Stomach cancer Neg Hx    Rectal cancer Neg Hx    Social History   Socioeconomic  History   Marital status: Married    Spouse name: Not on file   Number of children: 2   Years of education: Not on file   Highest education level: Not on file  Occupational History   Occupation: retired  Tobacco Use   Smoking status: Former    Current packs/day: 0.00    Average packs/day: 0.5 packs/day for 8.0 years (4.0 ttl pk-yrs)    Types: Cigarettes    Start date: 06/24/1963    Quit date: 06/24/1971    Years since quitting: 52.4   Smokeless tobacco: Never  Vaping Use   Vaping status: Never Used  Substance and Sexual Activity   Alcohol use: No   Drug use: No   Sexual activity: Yes  Other Topics Concern   Not on file  Social History Narrative   Not on file   Social Drivers of Health   Financial Resource Strain: Low Risk  (11/04/2023)   Overall Financial Resource Strain (CARDIA)    Difficulty of Paying Living Expenses: Not hard at all  Food Insecurity: No Food Insecurity (11/04/2023)   Hunger Vital Sign    Worried About Running Out of Food in the Last Year: Never true    Ran Out of Food in the Last Year: Never true  Transportation Needs: No Transportation Needs (11/04/2023)   PRAPARE - Administrator, Civil Service (Medical): No    Lack of Transportation (Non-Medical): No  Physical Activity: Sufficiently Active (11/04/2023)   Exercise Vital Sign    Days of Exercise per Week: 3 days    Minutes of Exercise per Session: 60 min  Stress: No Stress Concern Present (11/04/2023)   [employer] of Occupational Health - Occupational Stress Questionnaire    Feeling of Stress : Not at all  Social Connections: Socially Integrated (11/04/2023)   Social Connection and Isolation Panel [NHANES]    Frequency of Communication with Friends and Family: Three times a week    Frequency of Social Gatherings with  Friends and Family: More than three times a week    Attends Religious Services: More than 4 times per year    Active Member of Clubs or Organizations: Yes    Attends Probation officer: More than 4 times per year    Marital Status: Married    Tobacco Counseling Counseling given: Not Answered    Clinical Intake:  Pre-visit preparation completed: Yes  Pain : 0-10 Pain Type: Neuropathic pain, Chronic pain (pt has shingles) Pain Location: Back Pain Orientation: Right Pain Descriptors / Indicators: Constant, Burning, Tightness, Throbbing Pain Onset: More than a month ago Pain Frequency: Constant     BMI - recorded: 28.21 Nutritional Status: BMI 25 -29 Overweight Nutritional Risks: None Diabetes: Yes CBG done?: No Did pt. bring in CBG monitor from home?: No  Lab Results  Component Value Date   HGBA1C 7.2 (H) 05/25/2023   HGBA1C 8.0 (H) 11/25/2022   HGBA1C 8.3 (H) 08/04/2022     How often do you need to have someone help you when you read instructions, pamphlets, or other written materials from your doctor or pharmacy?: 1 - Never What is the last grade level you completed in school?: HS graduate  Interpreter Needed?: No  Information entered by :: Juliann Ochoa   Activities of Daily Living     11/04/2023    2:50 PM  In your present state of health, do you have any difficulty performing the following activities:  Hearing? 0  Vision? 0  Difficulty concentrating or making decisions? 0  Walking or climbing stairs? 0  Dressing or bathing? 0  Doing errands, shopping? 0  Preparing Food and eating ? N  Using the Toilet? N  In the past six months, have you accidently leaked urine? N  Do you have problems with loss of bowel control? N  Managing your Medications? N  Managing your Finances? N  Housekeeping or managing your Housekeeping? N    Patient Care Team: Copland, Skipper Dumas, MD as PCP - General (Family Medicine) Elmyra Haggard, MD as PCP - Cardiology (Cardiology) Magrinat, Rozella Cornfield, MD (Inactive) as Consulting Physician (Oncology) Murtis Arthur, OD (Optometry)  Indicate any recent Medical Services you may have  received from other than [facility] providers in the past year (date may be approximate).     Assessment:    This is a routine wellness examination for Masiyah.  Hearing/Vision screen Hearing Screening - Comments:: No hearing difficulties Vision Screening - Comments:: Patient has some vision difficulties    Goals Addressed             This Visit's Progress    Increase physical activity   On track      Depression Screen     11/04/2023    2:52 PM 10/21/2022   10:22 AM 08/04/2022   10:02 AM 03/25/2022    2:32 PM 10/17/2021    1:15 PM 10/11/2020   10:29 AM 10/03/2019   10:35 AM  PHQ 2/9 Scores  PHQ - 2 Score 0 0 0 0 0 0 0  PHQ- 9 Score 0          Fall Risk     11/04/2023    2:50 PM 10/21/2022   10:20 AM 08/04/2022   10:02 AM 03/25/2022    2:26 PM 10/17/2021    1:15 PM  Fall Risk   Falls in the past year? 0 0 0 0 0  Number falls in past yr: 0 0 0 0 0  Injury  with Fall? 0 0 0 0 0  Risk for fall due to : No Fall Risks No Fall Risks No Fall Risks No Fall Risks No Fall Risks  Follow up Falls prevention discussed;Falls evaluation completed Falls evaluation completed Falls evaluation completed Falls evaluation completed Falls evaluation completed    MEDICARE RISK AT HOME:  Medicare Risk at Home Any stairs in or around the home?: Yes If so, are there any without handrails?: No Home free of loose throw rugs in walkways, pet beds, electrical cords, etc?: Yes Adequate lighting in your home to reduce risk of falls?: Yes Life alert?: No Use of a cane, walker or w/c?: No Grab bars in the bathroom?: Yes Shower chair or bench in shower?: Yes Elevated toilet seat or a handicapped toilet?: Yes  TIMED UP AND GO:  Was the test performed?  No  Cognitive Function: 6CIT completed    09/28/2017   10:33 AM  MMSE - Mini Mental State Exam  Orientation to time 5  Orientation to Place 5  Registration 3  Attention/ Calculation 5  Recall 3  Language- name 2 objects 2  Language- repeat 1   Language- follow 3 step command 3  Language- read & follow direction 1  Write a sentence 1        11/04/2023    2:48 PM 10/21/2022   10:34 AM 10/17/2021    1:20 PM  6CIT Screen  What Year? 0 points 0 points 0 points  What month? 0 points 0 points 0 points  What time? 0 points 0 points 0 points  Count back from 20 0 points 0 points 0 points  Months in reverse 0 points 0 points 0 points  Repeat phrase 0 points 0 points 0 points  Total Score 0 points 0 points 0 points    Immunizations Immunization History  Administered Date(s) Administered   Influenza, High Dose Seasonal PF 05/07/2015, 05/06/2016, 05/11/2017, 04/05/2018   Pneumococcal Conjugate-13 05/11/2017   Pneumococcal Polysaccharide-23 12/19/2014   Tdap 12/19/2016    Screening Tests Health Maintenance  Topic Date Due   COVID-19 Vaccine (1) Never done   Zoster Vaccines- Shingrix (1 of 2) Never done   OPHTHALMOLOGY EXAM  01/21/2023   HEMOGLOBIN A1C  11/23/2023   Diabetic kidney evaluation - Urine ACR  12/15/2023   INFLUENZA VACCINE  01/22/2024   Diabetic kidney evaluation - eGFR measurement  06/03/2024   FOOT EXAM  06/30/2024   Medicare Annual Wellness (AWV)  11/03/2024   DTaP/Tdap/Td (2 - Td or Tdap) 12/20/2026   Colonoscopy  02/28/2032   Pneumonia Vaccine 12+ Years old  Completed   Hepatitis C Screening  Completed   HPV VACCINES  Aged Out   Meningococcal B Vaccine  Aged Out    Health Maintenance  Health Maintenance Due  Topic Date Due   COVID-19 Vaccine (1) Never done   Zoster Vaccines- Shingrix (1 of 2) Never done   OPHTHALMOLOGY EXAM  01/21/2023   Health Maintenance Items Addressed:eye exam request sent. Patient declined vaccinations  Additional Screening:  Vision Screening: Recommended annual ophthalmology exams for early detection of glaucoma and other disorders of the eye.  Dental Screening: Recommended annual dental exams for proper oral hygiene  Community Resource Referral / Chronic Care  Management: CRR required this visit?  No   CCM required this visit?  No   Plan:    I have personally reviewed and noted the following in the patient's chart:   Medical and social history Use of alcohol, tobacco  or illicit drugs  Current medications and supplements including opioid prescriptions. Patient is not currently taking opioid prescriptions. Functional ability and status Nutritional status Physical activity Advanced directives List of other physicians Hospitalizations, surgeries, and ER visits in previous 12 months Vitals Screenings to include cognitive, depression, and falls Referrals and appointments  In addition, I have reviewed and discussed with patient certain preventive protocols, quality metrics, and best practice recommendations. A written personalized care plan for preventive services as well as general preventive health recommendations were provided to patient.   Freeda Jerry, [place]   11/04/2023   After Visit Summary: (MyChart) Due to this being a telephonic visit, the after visit summary with patients personalized plan was offered to patient via MyChart   Notes: Nothing significant to report at this time.

## 2023-11-06 ENCOUNTER — Other Ambulatory Visit: Payer: Self-pay | Admitting: Internal Medicine

## 2023-11-06 ENCOUNTER — Other Ambulatory Visit: Payer: Self-pay | Admitting: Family Medicine

## 2023-11-06 DIAGNOSIS — E876 Hypokalemia: Secondary | ICD-10-CM

## 2023-11-06 DIAGNOSIS — Z8739 Personal history of other diseases of the musculoskeletal system and connective tissue: Secondary | ICD-10-CM

## 2023-11-06 DIAGNOSIS — K219 Gastro-esophageal reflux disease without esophagitis: Secondary | ICD-10-CM

## 2023-11-17 ENCOUNTER — Telehealth: Payer: Self-pay | Admitting: Internal Medicine

## 2023-11-17 MED ORDER — VALSARTAN-HYDROCHLOROTHIAZIDE 160-25 MG PO TABS
1.0000 | ORAL_TABLET | Freq: Every day | ORAL | 0 refills | Status: DC
Start: 2023-11-17 — End: 2024-02-19

## 2023-11-17 NOTE — Telephone Encounter (Signed)
*  STAT* If patient is at the pharmacy, call can be transferred to refill team.   1. Which medications need to be refilled? (please list name of each medication and dose if known) valsartan -hydrochlorothiazide  (DIOVAN -HCT) 160-25 MG tablet    2. Would you like to learn more about the convenience, safety, & potential cost savings by using the [facility]?     3. Are you open to using the [facility] (Type [facility].  ).   4. Which pharmacy/location (including street and city if local pharmacy) is medication to be sent to? [facility] - [place], [place] - [address]    5. Do they need a 30 day or 90 day supply? 90 day

## 2023-11-17 NOTE — Telephone Encounter (Signed)
 Pt's medication was sent to pt's pharmacy as requested. Confirmation received.

## 2023-12-03 ENCOUNTER — Other Ambulatory Visit: Payer: Self-pay

## 2023-12-03 ENCOUNTER — Encounter: Payer: Self-pay | Admitting: Hematology & Oncology

## 2023-12-03 ENCOUNTER — Inpatient Hospital Stay (HOSPITAL_BASED_OUTPATIENT_CLINIC_OR_DEPARTMENT_OTHER): Payer: Medicare HMO | Admitting: Medical Oncology

## 2023-12-03 ENCOUNTER — Inpatient Hospital Stay: Payer: Medicare HMO | Attending: Hematology & Oncology

## 2023-12-03 VITALS — BP 128/69 | HR 88 | Temp 97.8°F | Resp 20 | Ht 72.0 in | Wt 213.9 lb

## 2023-12-03 DIAGNOSIS — C9110 Chronic lymphocytic leukemia of B-cell type not having achieved remission: Secondary | ICD-10-CM

## 2023-12-03 DIAGNOSIS — C9111 Chronic lymphocytic leukemia of B-cell type in remission: Secondary | ICD-10-CM

## 2023-12-03 DIAGNOSIS — R5383 Other fatigue: Secondary | ICD-10-CM

## 2023-12-03 DIAGNOSIS — I499 Cardiac arrhythmia, unspecified: Secondary | ICD-10-CM

## 2023-12-03 LAB — CBC WITH DIFFERENTIAL (CANCER CENTER ONLY)
Abs Immature Granulocytes: 0.06 10*3/uL (ref 0.00–0.07)
Basophils Absolute: 0.1 10*3/uL (ref 0.0–0.1)
Basophils Relative: 0 %
Eosinophils Absolute: 0.1 10*3/uL (ref 0.0–0.5)
Eosinophils Relative: 1 %
HCT: 41.1 % (ref 39.0–52.0)
Hemoglobin: 14.1 g/dL (ref 13.0–17.0)
Immature Granulocytes: 0 %
Lymphocytes Relative: 63 %
Lymphs Abs: 10 10*3/uL — ABNORMAL HIGH (ref 0.7–4.0)
MCH: 31.6 pg (ref 26.0–34.0)
MCHC: 34.3 g/dL (ref 30.0–36.0)
MCV: 92.2 fL (ref 80.0–100.0)
Monocytes Absolute: 0.8 10*3/uL (ref 0.1–1.0)
Monocytes Relative: 5 %
Neutro Abs: 5.1 10*3/uL (ref 1.7–7.7)
Neutrophils Relative %: 31 %
Platelet Count: 222 10*3/uL (ref 150–400)
RBC: 4.46 MIL/uL (ref 4.22–5.81)
RDW: 12.8 % (ref 11.5–15.5)
Smear Review: NORMAL
WBC Count: 16.1 10*3/uL — ABNORMAL HIGH (ref 4.0–10.5)
nRBC: 0 % (ref 0.0–0.2)

## 2023-12-03 LAB — CMP (CANCER CENTER ONLY)
ALT: 20 U/L (ref 0–44)
AST: 21 U/L (ref 15–41)
Albumin: 4.6 g/dL (ref 3.5–5.0)
Alkaline Phosphatase: 53 U/L (ref 38–126)
Anion gap: 9 (ref 5–15)
BUN: 28 mg/dL — ABNORMAL HIGH (ref 8–23)
CO2: 30 mmol/L (ref 22–32)
Calcium: 9.8 mg/dL (ref 8.9–10.3)
Chloride: 104 mmol/L (ref 98–111)
Creatinine: 1.27 mg/dL — ABNORMAL HIGH (ref 0.61–1.24)
GFR, Estimated: 59 mL/min — ABNORMAL LOW (ref >60)
Glucose, Bld: 170 mg/dL — ABNORMAL HIGH (ref 70–99)
Potassium: 4.3 mmol/L (ref 3.5–5.1)
Sodium: 143 mmol/L (ref 135–145)
Total Bilirubin: 1 mg/dL (ref 0.0–1.2)
Total Protein: 6.8 g/dL (ref 6.5–8.1)

## 2023-12-03 LAB — SAMPLE TO BLOOD BANK

## 2023-12-03 NOTE — Progress Notes (Signed)
 Hematology and Oncology Follow Up Visit  Brandon Stone [id number] April 15, 1946 78 y.o. 12/03/2023   Principle Diagnosis:  Stage A CLL  Current Therapy:   Observation     Interim History:  Brandon Stone is back for follow-up.  The last time that we saw him he had a new outbreak of shingles of his right T4-T5 dermatome. Not only was this very painful to him but also, we suspected, this outbreak elevated his WBC values. He is normally seen yearly however he was asked to return in 6 months given his CBC changes.   Today he reports that he is ok. The area of his back and chest affected by his former outbreak is still painful- though improving slowly with time. He has tried gabapentin  and tylenol  with mild improvement. He is now on tylenol  once daily which helps take the edge off.   He has started to try to be a bit more active. He finds that if he over does it his heart palpitations return. He is followed by cardiologist for this. He has felt them today. No chest pain, SOB, peripheral edema.   No night sweats or unintentional weight loss.   He has had no problems with nausea or vomiting.  He has had no change in bowel or bladder habits.  He has had no cough.  Overall, I would say his performance status is probably ECOG 1.    Wt Readings from Last 3 Encounters:  12/03/23 213 lb 14.4 oz (97 kg)  11/04/23 208 lb (94.3 kg)  07/01/23 200 lb (90.7 kg)     Medications:  Current Outpatient Medications:    acetaminophen  (TYLENOL ) 325 MG tablet, Take 325 mg by mouth every 6 (six) hours as needed for mild pain., Disp: , Rfl:    allopurinol  (ZYLOPRIM ) 300 MG tablet, TAKE 1 TABLET EVERY DAY, Disp: 90 tablet, Rfl: 3   amLODipine  (NORVASC ) 10 MG tablet, TAKE 1 TABLET EVERY DAY, Disp: 90 tablet, Rfl: 3   aspirin  EC 81 MG tablet, Take 81 mg by mouth daily., Disp: , Rfl:    atorvastatin  (LIPITOR) 20 MG tablet, Take 1 tablet (20 mg total) by mouth daily., Disp: 90 tablet, Rfl: 1   Evolocumab  (REPATHA   SURECLICK) 140 MG/ML SOAJ, INJECT 1 PEN INTO THE SKIN EVERY 14 (FOURTEEN) DAYS., Disp: 6 mL, Rfl: 3   icosapent  Ethyl (VASCEPA ) 1 g capsule, TAKE 2 CAPSULES BY MOUTH TWICE A DAY, Disp: 360 capsule, Rfl: 2   isosorbide  mononitrate (IMDUR ) 60 MG 24 hr tablet, TAKE 1 TABLET EVERY DAY, Disp: 90 tablet, Rfl: 3   loratadine (CLARITIN) 10 MG tablet, Take 10 mg by mouth daily as needed for allergies., Disp: , Rfl:    metFORMIN  (GLUCOPHAGE ) 1000 MG tablet, Take 1 tablet (1,000 mg total) by mouth 2 (two) times daily with a meal., Disp: 180 tablet, Rfl: 3   metoprolol  succinate (TOPROL -XL) 25 MG 24 hr tablet, TAKE 1 TABLET IN THE MORNING AND AT BEDTIME, Disp: 180 tablet, Rfl: 3   MULTIPLE VITAMIN-FOLIC ACID PO, Take 1 tablet by mouth daily. Contains 400 mg folic acid, Disp: , Rfl:    nitroGLYCERIN  (NITROSTAT ) 0.4 MG SL tablet, Place 1 tablet (0.4 mg total) under the tongue every 5 (five) minutes as needed for chest pain., Disp: 25 tablet, Rfl: 3   omeprazole  (PRILOSEC) 20 MG capsule, TAKE 1 CAPSULE TWICE DAILY, Disp: 180 capsule, Rfl: 3   potassium chloride  SA (KLOR-CON  M) 20 MEQ tablet, TAKE 1 TABLET EVERY DAY, Disp: 90  tablet, Rfl: 3   TRUE METRIX BLOOD GLUCOSE TEST test strip, TEST AS DIRECTED DAILY, Disp: 100 strip, Rfl: 3   TRUEplus Lancets 33G MISC, USE AS DIRECTED ONCE A DAY., Disp: 100 each, Rfl: 3   valsartan -hydrochlorothiazide  (DIOVAN -HCT) 160-25 MG tablet, Take 1 tablet by mouth daily., Disp: 90 tablet, Rfl: 0  Allergies:  Allergies  Allergen Reactions   Black Pepper [Piper] Palpitations   Codeine Palpitations and Other (See Comments)    Wild dreams, palpitations   Other Hives and Rash    opsite and tegaderm tape.   Rosuvastatin  Nausea Only   Tape Rash    Blisters from Tegaderm and tape with orange backing(opsite?)   Ticlopidine Hcl Hives and Itching    Past Medical History, Surgical history, Social history, and Family History were reviewed and updated.  Review of Systems: Review of  Systems  Constitutional: Negative.   HENT:  Negative.    Eyes: Negative.   Respiratory: Negative.    Cardiovascular: Negative.   Gastrointestinal: Negative.   Endocrine: Negative.   Genitourinary: Negative.    Musculoskeletal: Negative.   Skin: Negative.   Neurological: Negative.   Hematological: Negative.   Psychiatric/Behavioral: Negative.      Physical Exam:  height is 6' (1.829 m) and weight is 213 lb 14.4 oz (97 kg). His oral temperature is 97.8 F (36.6 C). His blood pressure is 128/69 and his pulse is 88. His respiration is 20 and oxygen saturation is 98%.   Wt Readings from Last 3 Encounters:  12/03/23 213 lb 14.4 oz (97 kg)  11/04/23 208 lb (94.3 kg)  07/01/23 200 lb (90.7 kg)    Physical Exam Vitals reviewed.  HENT:     Head: Normocephalic and atraumatic.   Eyes:     Pupils: Pupils are equal, round, and reactive to light.    Cardiovascular:     Rate and Rhythm: Normal rate. Rhythm irregular.     Heart sounds: Normal heart sounds.  Pulmonary:     Effort: Pulmonary effort is normal.     Breath sounds: Normal breath sounds.  Abdominal:     General: Bowel sounds are normal.     Palpations: Abdomen is soft.   Musculoskeletal:        General: No tenderness or deformity. Normal range of motion.     Cervical back: Normal range of motion.  Lymphadenopathy:     Cervical: No cervical adenopathy.   Skin:    General: Skin is warm and dry.     Findings: No erythema or rash.     Comments: Skin exam does show a scarred shingles rash in the right T4-T5 dermatome.   Neurological:     Mental Status: He is alert and oriented to person, place, and time.   Psychiatric:        Behavior: Behavior normal.        Thought Content: Thought content normal.        Judgment: Judgment normal.      Lab Results  Component Value Date   WBC 16.1 (H) 12/03/2023   HGB 14.1 12/03/2023   HCT 41.1 12/03/2023   MCV 92.2 12/03/2023   PLT 222 12/03/2023     Chemistry       Component Value Date/Time   NA 143 12/03/2023 1003   NA 143 06/01/2020 0900   NA 143 06/11/2017 1044   NA 140 04/27/2014 0815   K 4.3 12/03/2023 1003   K 3.2 (L) 06/11/2017 1044   K 3.7  04/27/2014 0815   CL 104 12/03/2023 1003   CL 101 06/11/2017 1044   CO2 30 12/03/2023 1003   CO2 28 06/11/2017 1044   CO2 26 04/27/2014 0815   BUN 28 (H) 12/03/2023 1003   BUN 18 06/01/2020 0900   BUN 17 06/11/2017 1044   BUN 18.5 04/27/2014 0815   CREATININE 1.27 (H) 12/03/2023 1003   CREATININE 1.1 06/11/2017 1044   CREATININE 1.2 04/27/2014 0815      Component Value Date/Time   CALCIUM  9.8 12/03/2023 1003   CALCIUM  9.7 06/11/2017 1044   CALCIUM  9.8 04/27/2014 0815   ALKPHOS 53 12/03/2023 1003   ALKPHOS 83 06/11/2017 1044   ALKPHOS 71 04/27/2014 0815   AST 21 12/03/2023 1003   AST 18 04/27/2014 0815   ALT 20 12/03/2023 1003   ALT 32 06/11/2017 1044   ALT 25 04/27/2014 0815   BILITOT 1.0 12/03/2023 1003   BILITOT 0.67 04/27/2014 0815      Encounter Diagnoses  Name Primary?   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 Margraf is a 78 year old white male.  He has stage A CLL.  He has minimal lymphocytosis.    EKG shows PACs and some chronic ST changes which are known to patient and his cardiologist. Reviewed his most recent EKG to today and changes appear stable. No chest pain, SOB, jaw pain, arm/shoulder pain. He has follow up with them in about 1 month. Reviewed red flags.  WBC is improved since his last visit. I do feel that we should keep his follow up to 6 months just to confirm that his count is reducing or remaining stable.   RTC 6 months MD, labs (CBC w/, CMP, LDH)  Sharla Davis, PA-C 6/12/202512:15 PM

## 2023-12-15 NOTE — Telephone Encounter (Signed)
   Patient Name: Brandon Stone  DOB: August 23, 1945 MRN: [id number]  Primary Cardiologist: Vina Gull, MD  Chart reviewed as part of pre-operative protocol coverage. Cataract extractions are recognized in guidelines as low risk surgeries that do not typically require specific preoperative testing or holding of blood thinner therapy. Therefore, given past medical history and time since last visit, based on ACC/AHA guidelines, Brandon Stone would be at acceptable risk for the planned procedure without further cardiovascular testing.   I will route this recommendation to the requesting party via Epic fax function and remove from pre-op pool.  Please call with questions.  Josefa CHRISTELLA Beauvais, NP 12/15/2023, 4:31 PM

## 2023-12-15 NOTE — Telephone Encounter (Signed)
 2nd Preop clearance request received. Procedure dates updated to the following:  01/11/24  &  02/01/24.  Will route to the preop pool for review.

## 2024-01-11 DIAGNOSIS — H2511 Age-related nuclear cataract, right eye: Secondary | ICD-10-CM | POA: Diagnosis not present

## 2024-01-12 DIAGNOSIS — H2512 Age-related nuclear cataract, left eye: Secondary | ICD-10-CM | POA: Diagnosis not present

## 2024-01-15 ENCOUNTER — Telehealth: Payer: Self-pay | Admitting: Pharmacist

## 2024-01-15 MED ORDER — METFORMIN HCL 1000 MG PO TABS: 1000.0000 mg | ORAL_TABLET | Freq: Two times a day (BID) | ORAL | 0 refills | Status: DC

## 2024-01-15 MED ORDER — ATORVASTATIN CALCIUM 20 MG PO TABS: 20.0000 mg | ORAL_TABLET | Freq: Every day | ORAL | 0 refills | Status: DC

## 2024-01-15 NOTE — Telephone Encounter (Signed)
 Review patient's chart due to adherence reports. He his out of refills for both atorvastatin  and metformin . He is due to see Dr Watt for 6 month recheck.  Forwarding request to her scheduler.   LM on Vm for patient with my CB# [phone]

## 2024-01-15 NOTE — Progress Notes (Signed)
 Pharmacy Quality Measure Review  This patient is appearing on a report for being at risk of failing the adherence measure for cholesterol (statin) and diabetes medications this calendar year.   Medication: atorvastatin   Last fill date: 08/17/2023 for 90 day supply  Reviewed recent refill history in Dr Annemarie database. Patient has 0 refills remaining on atorvastatin  Rx Next appointment with PCP is not currently scheduled but he will see cardiologist in August 2025.     Medication: metformin   Last fill date: 10/22/2023 for 90 day supply but has not refills remaining and will need a refill in the next 2 weeks. Looks like he is due to follow up with Dr Watt soon.    Left voicemail for patient to return my call at their convenience., Will collaborate with provider to facilitate refill needs., and Forward request to Dr [employer] scheduler to make follow up with Dr Watt fro 6 month follow up.  Madelin Ray, PharmD Clinical Pharmacist [facility]  Population Health [phone]

## 2024-01-28 ENCOUNTER — Other Ambulatory Visit: Payer: Self-pay | Admitting: Internal Medicine

## 2024-02-01 DIAGNOSIS — H2512 Age-related nuclear cataract, left eye: Secondary | ICD-10-CM | POA: Diagnosis not present

## 2024-02-06 ENCOUNTER — Other Ambulatory Visit: Payer: Self-pay | Admitting: Internal Medicine

## 2024-02-06 NOTE — Patient Instructions (Incomplete)
 It was good to see you today, I will be in touch with your labs  Recommend that you get the shingles vaccine series at your convenience, this can be given at your pharmacy.  Unfortunately it is possible for you to get shingles again Also recommend a flu shot and COVID booster this fall Also recommend 1 dose of RSV vaccine  You can take a gabapentin  300 mg at bedtime for a week or so, then add one in the am as well if needed Let me know if this seems to be helping you  We can add back glipizide - start with 2.5 mg daily,  can go up if needed

## 2024-02-06 NOTE — Progress Notes (Unsigned)
 [facility] [address], [address] [place], [place] [address] [phone]  Date:  02/10/2024   Name:  Brandon Stone   DOB:  [birth year]   MRN:  [id number]  PCP:  Watt Harlene BROCKS, MD    Chief Complaint: No chief complaint on file.   History of Present Illness:  Brandon Stone is a 78 y.o. very pleasant male patient who presents with the following:  Patient seen today for periodic follow-up.  Most recent visit with me was in January; he was following up from a recent shingles outbreak.  It has been about 6 months to get over shingles pain and he was treated with narcotics for some time-last filled in February.  History of diabetes, CLL, hyperlipidemia, hypertension, CAD status post CABG 2004 and 1991, subdural hematoma March 2020.  He has an MI in 1998 and had a stent    Extensive CV disease history summarized by Dr Okey in her recent note:  Brandon Stone is a 78 y.o. male with a history of CAD (s/p CABG in 1999; IWMI in 1998.  Redo CABG in 2004 (SVG to OM/LCx; LRA to PDA).  Cath in July 2013 due to increased CP showed severe LM disease into LCx.; SVG to OM 100%; LIMA to Diag and LAD patent; Radial Artery to PDA patent   LVEF 45%  Plan was to continue medical Rx.  Myoview  in 2016 showed scar but no ischemia   2018    Echo showed LVEF normal  Mild diastolic dysfunciton.  Most recent heme/onc note from June-they note stage A CLL with minimal lymphocytosis  Can update A1c Recommend flu shot and COVID booster this fall Needs urine micro Recommend Shingrix at his pharmacy Eye exam Colonoscopy up-to-date, 2023 He has completed pneumonia vaccination  CMP, CBC on chart from June-we do see a mild bump in his creatinine at that time Patient Active Problem List   Diagnosis Date Noted   Capsulitis of left shoulder 03/31/2022   AAA (abdominal aortic aneurysm) (HCC) 10/31/2019   Subdural hematoma (HCC) 09/08/2018   Diabetes mellitus  without complication (HCC) 01/31/2018   Symptomatic cholelithiasis 09/18/2016   Chronic lymphocytic leukemia (CLL), B-cell (HCC) 05/13/2014   Stable angina (HCC) 01/26/2012   Hyperlipidemia 06/21/2008   HYPERTENSION, BENIGN 06/21/2008   CAD, NATIVE VESSEL 06/21/2008    Past Medical History:  Diagnosis Date   Anginal pain (HCC)    Arthritis    Barrett's esophagus    BPH (benign prostatic hypertrophy)    CAD (coronary artery disease)     a. CABG 1991;  b.  IWMI  1998;  c.  S/P PTCA  stents to SVG ot OM last in 2003;  d.  Redo CABG x 2 in 2004 (VG->OM->LCX, LRA->PDA);  e. 12/2011 Cath: 3vd, sev LM into LCX dzs, VG->OM 100, LIMA->DIAG->LAD patent, Rad Art->PDA patent, EF 45%, Med Rx.   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)    Myocardial infarction (HCC)    at age 79   Psoriasis    Rheumatic fever    as a child   S/P coronary artery stent placement 1998  TIA (transient ischemic attack)    FOUND ON MRI  PER PATIENT-     NO PROBLEM    Past Surgical History:  Procedure Laterality Date   APPENDECTOMY     CHOLECYSTECTOMY N/A 09/18/2016   Procedure: LAPAROSCOPIC CHOLECYSTECTOMY WITH INTRAOPERATIVE CHOLANGIOGRAM;  Surgeon: Krystal Russell, MD;  Location: [facility] OR;  Service: General;  Laterality: N/A;   COLONOSCOPY  07/2013   COLONOSCOPY WITH PROPOFOL   07/21/2016   Dr.Armbruster   CORONARY ANGIOPLASTY     STENTS   CORONARY ARTERY BYPASS GRAFT     1991 (LIMA to LAD/Diag;  SVG to RCA;  SVG to OM)   CORONARY ARTERY BYPASS GRAFT     2004 (L radial to PDA; SVG to OM1/ distal LCx)   POLYPECTOMY     TONSILLECTOMY AND ADENOIDECTOMY     UMBILICAL HERNIA REPAIR      Social History   Tobacco Use   Smoking status: Former    Current packs/day: 0.00    Average  packs/day: 0.5 packs/day for 8.0 years (4.0 ttl pk-yrs)    Types: Cigarettes    Start date: 06/24/1963    Quit date: 06/24/1971    Years since quitting: 52.6   Smokeless tobacco: Never  Vaping Use   Vaping status: Never Used  Substance Use Topics   Alcohol use: No   Drug use: No    Family History  Problem Relation Age of Onset   Heart disease Mother    Hypertension Mother    Hyperlipidemia Mother    Diabetes Mother    Skin cancer Mother        skin   Heart disease Father    Hypertension Father    Hyperlipidemia Father    Diabetes Father    Stroke Sister    Stroke Brother    Colon cancer Neg Hx    Esophageal cancer Neg Hx    Stomach cancer Neg Hx    Rectal cancer Neg Hx     Allergies  Allergen Reactions   Black Pepper [Piper] Palpitations   Codeine Palpitations and Other (See Comments)    Wild dreams, palpitations   Other Hives and Rash    opsite and tegaderm tape.   Rosuvastatin  Nausea Only   Tape Rash    Blisters from Tegaderm and tape with orange backing(opsite?)   Ticlopidine Hcl Hives and Itching    Medication list has been reviewed and updated.  Current Outpatient Medications on File Prior to Visit  Medication Sig Dispense Refill   acetaminophen  (TYLENOL ) 325 MG tablet Take 325 mg by mouth every 6 (six) hours as needed for mild pain.     allopurinol  (ZYLOPRIM ) 300 MG tablet TAKE 1 TABLET EVERY DAY 90 tablet 3   amLODipine  (NORVASC ) 10 MG tablet Take 1 tablet (10 mg total) by mouth daily. KEEP OV. 90 tablet 0   aspirin  EC 81 MG tablet Take 81 mg by mouth daily.     atorvastatin  (LIPITOR) 20 MG tablet Take 1 tablet (20 mg total) by mouth daily. 90 tablet 0   Evolocumab  (REPATHA  SURECLICK) 140 MG/ML SOAJ INJECT 1 PEN INTO THE SKIN EVERY 14 (FOURTEEN) DAYS. 6 mL 3   icosapent  Ethyl (VASCEPA ) 1 g capsule TAKE 2 CAPSULES BY MOUTH TWICE A DAY 360 capsule 2   isosorbide  mononitrate (IMDUR ) 60 MG 24 hr tablet Take 1 tablet (60 mg total) by mouth daily. KEEP OV. 90  tablet 0   loratadine (CLARITIN) 10 MG tablet Take 10 mg by mouth daily as  needed for allergies.     metFORMIN  (GLUCOPHAGE ) 1000 MG tablet Take 1 tablet (1,000 mg total) by mouth 2 (two) times daily with a meal. 180 tablet 0   metoprolol  succinate (TOPROL -XL) 25 MG 24 hr tablet TAKE 1 TABLET IN THE MORNING AND AT BEDTIME 180 tablet 3   MULTIPLE VITAMIN-FOLIC ACID PO Take 1 tablet by mouth daily. Contains 400 mg folic acid     nitroGLYCERIN  (NITROSTAT ) 0.4 MG SL tablet Place 1 tablet (0.4 mg total) under the tongue every 5 (five) minutes as needed for chest pain. 25 tablet 3   omeprazole  (PRILOSEC) 20 MG capsule TAKE 1 CAPSULE TWICE DAILY 180 capsule 3   potassium chloride  SA (KLOR-CON  M) 20 MEQ tablet TAKE 1 TABLET EVERY DAY 90 tablet 3   TRUE METRIX BLOOD GLUCOSE TEST test strip TEST AS DIRECTED DAILY 100 strip 3   TRUEplus Lancets 33G MISC USE AS DIRECTED ONCE A DAY. 100 each 3   valsartan -hydrochlorothiazide  (DIOVAN -HCT) 160-25 MG tablet Take 1 tablet by mouth daily. 90 tablet 0   No current facility-administered medications on file prior to visit.    Review of Systems:  As per HPI- otherwise negative.   Physical Examination: There were no vitals filed for this visit. There were no vitals filed for this visit. There is no height or weight on file to calculate BMI. Ideal Body Weight:    GEN: no acute distress. HEENT: Atraumatic, Normocephalic.  Ears and Nose: No external deformity. CV: RRR, No M/G/R. No JVD. No thrill. No extra heart sounds. PULM: CTA B, no wheezes, crackles, rhonchi. No retractions. No resp. distress. No accessory muscle use. ABD: S, NT, ND, +BS. No rebound. No HSM. EXTR: No c/c/e PSYCH: Normally interactive. Conversant. '  Assessment and Plan: ***  Signed Harlene Schroeder, MD

## 2024-02-08 ENCOUNTER — Ambulatory Visit: Admitting: Family Medicine

## 2024-02-10 ENCOUNTER — Ambulatory Visit (INDEPENDENT_AMBULATORY_CARE_PROVIDER_SITE_OTHER): Admitting: Family Medicine

## 2024-02-10 ENCOUNTER — Encounter: Payer: Self-pay | Admitting: Family Medicine

## 2024-02-10 VITALS — BP 130/84 | HR 74 | Ht 72.0 in | Wt 213.0 lb

## 2024-02-10 DIAGNOSIS — K529 Noninfective gastroenteritis and colitis, unspecified: Secondary | ICD-10-CM | POA: Diagnosis not present

## 2024-02-10 DIAGNOSIS — C9110 Chronic lymphocytic leukemia of B-cell type not having achieved remission: Secondary | ICD-10-CM

## 2024-02-10 DIAGNOSIS — I25118 Atherosclerotic heart disease of native coronary artery with other forms of angina pectoris: Secondary | ICD-10-CM

## 2024-02-10 DIAGNOSIS — E119 Type 2 diabetes mellitus without complications: Secondary | ICD-10-CM | POA: Diagnosis not present

## 2024-02-10 DIAGNOSIS — Z125 Encounter for screening for malignant neoplasm of prostate: Secondary | ICD-10-CM | POA: Diagnosis not present

## 2024-02-10 DIAGNOSIS — Z7984 Long term (current) use of oral hypoglycemic drugs: Secondary | ICD-10-CM | POA: Diagnosis not present

## 2024-02-10 DIAGNOSIS — R197 Diarrhea, unspecified: Secondary | ICD-10-CM

## 2024-02-10 DIAGNOSIS — I10 Essential (primary) hypertension: Secondary | ICD-10-CM | POA: Diagnosis not present

## 2024-02-10 DIAGNOSIS — E785 Hyperlipidemia, unspecified: Secondary | ICD-10-CM | POA: Diagnosis not present

## 2024-02-10 DIAGNOSIS — I2089 Other forms of angina pectoris: Secondary | ICD-10-CM

## 2024-02-10 LAB — BASIC METABOLIC PANEL WITH GFR
BUN: 15 mg/dL (ref 6–23)
CO2: 32 meq/L (ref 19–32)
Calcium: 9.4 mg/dL (ref 8.4–10.5)
Chloride: 101 meq/L (ref 96–112)
Creatinine, Ser: 1.11 mg/dL (ref 0.40–1.50)
GFR: 64.87 mL/min (ref >60.00)
Glucose, Bld: 146 mg/dL — ABNORMAL HIGH (ref 70–99)
Potassium: 4 meq/L (ref 3.5–5.1)
Sodium: 141 meq/L (ref 135–145)

## 2024-02-10 LAB — MICROALBUMIN / CREATININE URINE RATIO
Creatinine,U: 166.8 mg/dL
Microalb Creat Ratio: 16.8 mg/g (ref 0.0–30.0)
Microalb, Ur: 2.8 mg/dL — ABNORMAL HIGH (ref 0.0–1.9)

## 2024-02-10 LAB — LIPID PANEL
Cholesterol: 143 mg/dL (ref 0–200)
HDL: 37.9 mg/dL — ABNORMAL LOW (ref >39.00)
LDL Cholesterol: 48 mg/dL (ref 0–99)
NonHDL: 104.64
Total CHOL/HDL Ratio: 4
Triglycerides: 281 mg/dL — ABNORMAL HIGH (ref 0.0–149.0)
VLDL: 56.2 mg/dL — ABNORMAL HIGH (ref 0.0–40.0)

## 2024-02-10 LAB — PSA: PSA: 1.43 ng/mL (ref 0.10–4.00)

## 2024-02-10 LAB — HEMOGLOBIN A1C: Hgb A1c MFr Bld: 8.3 % — ABNORMAL HIGH (ref 4.6–6.5)

## 2024-02-10 MED ORDER — ATORVASTATIN CALCIUM 20 MG PO TABS
20.0000 mg | ORAL_TABLET | Freq: Every day | ORAL | 3 refills | Status: DC
Start: 2024-02-10 — End: 2024-04-26

## 2024-02-10 MED ORDER — GLIPIZIDE ER 2.5 MG PO TB24: 2.5000 mg | ORAL_TABLET | Freq: Every day | ORAL | 3 refills | Status: AC

## 2024-02-11 ENCOUNTER — Other Ambulatory Visit

## 2024-02-11 DIAGNOSIS — K529 Noninfective gastroenteritis and colitis, unspecified: Secondary | ICD-10-CM | POA: Diagnosis not present

## 2024-02-13 ENCOUNTER — Encounter: Payer: Self-pay | Admitting: Family Medicine

## 2024-02-13 LAB — CLOSTRIDIUM DIFFICILE BY PCR: Toxigenic C. Difficile by PCR: NEGATIVE

## 2024-02-14 ENCOUNTER — Other Ambulatory Visit: Payer: Self-pay | Admitting: Internal Medicine

## 2024-02-17 NOTE — Progress Notes (Addendum)
 Cardiology Office Note   Date: 02/19/2024  ID:  Brandon Stone, DOB 05-07-1946, MRN [id number]  PCP:  Watt Harlene BROCKS, MD  Cardiologist:   Vina Gull, MD     F/U of CAD   History of Present Illness: Brandon Stone is a 78 y.o. male with a history ofCAD, HTN, HL and DM     1998   IWMI     1999   CABG 2004  Redo CABG (SVG to OM/LCx; LRA to PDA).  2013  CP lead to LHC  Severe LM disease into LCx.; SVG to OM 100%; LIMA to Diag and LAD patent; Radial Artery to PDA patent   LVEF 45%  Plan was to continue medical Rx.\    2016 Myoview  showed scar but no ischemia   2018    Echo showed LVEF normal  Mild diastolic dysfunciton.   2023 Myoview  showed no ischemia  Previous inferior scar  Unchanged   I saw the pt in Jun 2024  Since seen he says his breathing has been good   He denies CP    No palpitaitions   Occsaional dizziness with quick standing     Biggest complaint is pain in R back after shingles    Very severe at times   Diet Breakfast:   Cereal with almond mik    Lunch:   Sandwich  [place] and cheese and bread  mayo avocado oil   Water      Crackers Snack  Fruit [place]alope    Dinner:  Tacos  chips with me Veggies    Snack:   sometimes    Current Meds  Medication Sig   acetaminophen  (TYLENOL ) 325 MG tablet Take 325 mg by mouth every 6 (six) hours as needed for mild pain.   allopurinol  (ZYLOPRIM ) 300 MG tablet TAKE 1 TABLET EVERY DAY   amLODipine  (NORVASC ) 10 MG tablet Take 1 tablet (10 mg total) by mouth daily. KEEP OV.   aspirin  EC 81 MG tablet Take 81 mg by mouth daily.   atorvastatin  (LIPITOR) 20 MG tablet Take 1 tablet (20 mg total) by mouth daily.   Evolocumab  (REPATHA  SURECLICK) 140 MG/ML SOAJ INJECT 1 PEN INTO THE SKIN EVERY 14 (FOURTEEN) DAYS.   gabapentin  (NEURONTIN ) 100 MG capsule Take 100 mg by mouth daily.   gatifloxacin (ZYMAXID) 0.5 % SOLN Place 1 drop into the left eye 4 (four) times daily.   glipiZIDE  (GLUCOTROL  XL) 2.5 MG 24 hr tablet Take 1 tablet (2.5 mg  total) by mouth daily with breakfast.   isosorbide  mononitrate (IMDUR ) 60 MG 24 hr tablet Take 1 tablet (60 mg total) by mouth daily. KEEP OV.   ketorolac (ACULAR) 0.5 % ophthalmic solution Place 1 drop into the left eye 4 (four) times daily.   loratadine (CLARITIN) 10 MG tablet Take 10 mg by mouth daily as needed for allergies.   metFORMIN  (GLUCOPHAGE ) 1000 MG tablet Take 1 tablet (1,000 mg total) by mouth 2 (two) times daily with a meal.   metoprolol  succinate (TOPROL -XL) 25 MG 24 hr tablet TAKE 1 TABLET IN THE MORNING AND AT BEDTIME   MULTIPLE VITAMIN-FOLIC ACID PO Take 1 tablet by mouth daily. Contains 400 mg folic acid   nitroGLYCERIN  (NITROSTAT ) 0.4 MG SL tablet Place 1 tablet (0.4 mg total) under the tongue every 5 (five) minutes as needed for chest pain.   omeprazole  (PRILOSEC) 20 MG capsule TAKE 1 CAPSULE TWICE DAILY   potassium chloride  SA (KLOR-CON  M) 20 MEQ tablet TAKE  1 TABLET EVERY DAY   prednisoLONE acetate (PRED FORTE) 1 % ophthalmic suspension Place 1 drop into the right eye 4 (four) times daily.   TRUE METRIX BLOOD GLUCOSE TEST test strip TEST AS DIRECTED DAILY   TRUEplus Lancets 33G MISC USE AS DIRECTED ONCE A DAY.   valsartan -hydrochlorothiazide  (DIOVAN -HCT) 160-25 MG tablet Take 1 tablet by mouth daily.   [DISCONTINUED] valsartan -hydrochlorothiazide  (DIOVAN -HCT) 160-25 MG tablet Take 1 tablet by mouth daily.   [DISCONTINUED] VASCEPA  1 g capsule TAKE 2 CAPSULES BY MOUTH TWICE A DAY     Allergies:   Black pepper [piper], Codeine, Other, Rosuvastatin , Tape, and Ticlopidine hcl   Past Medical History:  Diagnosis Date   Anginal pain (HCC)    Arthritis    Barrett's esophagus    BPH (benign prostatic hypertrophy)    CAD (coronary artery disease)     a. CABG 1991;  b.  IWMI  1998;  c.  S/P PTCA  stents to SVG ot OM last in 2003;  d.  Redo CABG x 2 in 2004 (VG->OM->LCX, LRA->PDA);  e. 12/2011 Cath: 3vd, sev LM into LCX dzs, VG->OM 100, LIMA->DIAG->LAD patent, Rad Art->PDA  patent, EF 45%, Med Rx.   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)    Myocardial infarction (HCC)    at age 20   Psoriasis    Rheumatic fever    as a child   S/P coronary artery stent placement 1998   TIA (transient ischemic attack)    FOUND ON MRI  PER PATIENT-     NO PROBLEM    Past Surgical History:  Procedure Laterality Date   APPENDECTOMY     CHOLECYSTECTOMY N/A 09/18/2016   Procedure: LAPAROSCOPIC CHOLECYSTECTOMY WITH INTRAOPERATIVE CHOLANGIOGRAM;  Surgeon: Krystal Russell, MD;  Location: [facility] OR;  Service: General;  Laterality: N/A;   COLONOSCOPY  07/2013   COLONOSCOPY WITH PROPOFOL   07/21/2016   Dr.Armbruster   CORONARY ANGIOPLASTY     STENTS   CORONARY ARTERY BYPASS GRAFT     1991 (LIMA to LAD/Diag;  SVG to RCA;  SVG to OM)   CORONARY ARTERY BYPASS GRAFT     2004 (L radial to PDA; SVG to OM1/ distal LCx)   POLYPECTOMY     TONSILLECTOMY AND ADENOIDECTOMY     UMBILICAL HERNIA REPAIR       Social History:  The patient  reports that he quit smoking about 52 years ago. His smoking use included cigarettes. He started smoking about 60 years ago. He has a 4 pack-year smoking history. He has never used smokeless tobacco. He reports that he does not drink alcohol and does not use drugs.   Family History:  The patient's family history includes Diabetes in his father and mother; Heart disease in his father and mother; Hyperlipidemia in his father and mother; Hypertension in his father and mother; Skin cancer in his mother; Stroke in his brother and sister.    ROS:  Please see the history of present illness. All other systems are reviewed and  Negative to the above problem except as noted.    PHYSICAL EXAM: VS:  BP 120/80   Pulse 83   Ht 6' (1.829  m)  Wt 215 lb 12.8 oz (97.9 kg)   SpO2 95%   BMI 29.27 kg/m   GEN: Obese 78  yo, in no acute distress  HEENT: normal  Neck: JVP Is normal     No carotid bruits Cardiac: RRR; no murmurs  Respiratory:  clear to auscultation b GI: soft, no masses,  No hepatomegaly  Ext are without edema     EKG:  EKG is  not done today   [facility] May 2023    Patient exercised according to the BRUCE protocol for 5:75min achieving 7.0 METs   Target HR was achieved (171bpm; 116% MPHR)   Findings are consistent with prior myocardial infarction. The study is intermediate risk.   ST depression in the inferolateral leads was noted.   LV perfusion is abnormal. Defect 1: There is a medium defect with severe reduction in uptake present in the apical to basal inferior and inferolateral location(s) that is fixed. There is abnormal wall motion in the defect area. Consistent with infarction. Defect 2: There is a small defect with moderate reduction in uptake present in the mid to basal anterolateral location(s) that is fixed. Consistent with infarction.   Left ventricular function is abnormal. Nuclear stress EF: 47 %. The left ventricular ejection fraction is mildly decreased (45-54%). End diastolic cavity size is normal.   Prior study available for comparison from 09/26/2014. No significant change from prior study. There continues to be infarct but no ischemia.  Monitor   2020  Sinus rhythm with occasional PVCs, short burst SVT (5 beats)   Echo   2018  - Left ventricle: The cavity size was normal. There was moderate    concentric hypertrophy. Systolic function was normal. The    estimated ejection fraction was in the range of 50% to 55%. There    is hypokinesis in the basal and mid inferior and inferolateral    walls. Doppler parameters are consistent with abnormal left    ventricular relaxation (grade 1 diastolic dysfunction). There was    no evidence of elevated ventricular filling pressure by Doppler     parameters.  - Aortic valve: Trileaflet; mildly thickened, mildly calcified    leaflets. Mobility was not restricted. Transvalvular velocity was    within the normal range. There was no stenosis. There was mild    regurgitation.  - Aortic root: The aortic root was mildly dilated measuring 42 mm.  - Ascending aorta: The ascending aorta was normal in size.  - Mitral valve: There was no regurgitation.  - Left atrium: The atrium was moderately dilated.  - Right ventricle: Systolic function was normal.  - Tricuspid valve: There was mild regurgitation.  - Pulmonic valve: There was trivial regurgitation.  - Pulmonary arteries: Systolic pressure was within the normal    range.  - Inferior vena cava: The vessel was normal in size.  - Pericardium, extracardiac: There was no pericardial effusion.    Lipid Panel    Component Value Date/Time   CHOL 143 02/10/2024 1115   CHOL 79 (L) 06/01/2020 0900   TRIG 281.0 (H) 02/10/2024 1115   HDL 37.90 (L) 02/10/2024 1115   HDL 42 06/01/2020 0900   CHOLHDL 4 02/10/2024 1115   VLDL 56.2 (H) 02/10/2024 1115   LDLCALC 48 02/10/2024 1115   LDLCALC 12 06/01/2020 0900   LDLDIRECT 36.0 08/04/2022 1053      Wt Readings from Last 3 Encounters:  02/19/24 215 lb 12.8 oz (97.9 kg)  02/10/24 213 lb (96.6 kg)  12/03/23 213  lb 14.4 oz (97 kg)      ASSESSMENT AND PLAN:  1   CAD  Pt with remote CABG x 2   Most recent Myoview  in 2023 showed no ischemia  Inferior scar  Pt remains symptom free  Follow   2.  HTN  BP remains well controlled   3  HL LDL 48  HDL 38  Trig 281   Had been on Vascepa   wil lresume   Alos needs tighter control of glucose.    Continue REpatha  and lipitor  4  DM  A1C 8/3 in Aug    Needs tighter control   Reviewed diet      5  Heme  Pt follows with [facility] for CLL       F/U in May 2024     Current medicines are reviewed at length with the patient today.  The patient does not have concerns regarding medicines.  Signed, Vina Gull, MD

## 2024-02-19 ENCOUNTER — Ambulatory Visit: Attending: Internal Medicine | Admitting: Internal Medicine

## 2024-02-19 ENCOUNTER — Encounter: Payer: Self-pay | Admitting: Internal Medicine

## 2024-02-19 VITALS — BP 120/80 | HR 83 | Ht 72.0 in | Wt 215.8 lb

## 2024-02-19 DIAGNOSIS — E785 Hyperlipidemia, unspecified: Secondary | ICD-10-CM | POA: Diagnosis not present

## 2024-02-19 DIAGNOSIS — I2510 Atherosclerotic heart disease of native coronary artery without angina pectoris: Secondary | ICD-10-CM | POA: Diagnosis not present

## 2024-02-19 MED ORDER — ICOSAPENT ETHYL 1 G PO CAPS: 2.0000 g | ORAL_CAPSULE | Freq: Two times a day (BID) | ORAL | 3 refills | Status: AC

## 2024-02-19 MED ORDER — ICOSAPENT ETHYL 1 G PO CAPS: 2.0000 g | ORAL_CAPSULE | Freq: Two times a day (BID) | ORAL | 3 refills | Status: DC

## 2024-02-19 MED ORDER — VALSARTAN-HYDROCHLOROTHIAZIDE 160-25 MG PO TABS: 1.0000 | ORAL_TABLET | Freq: Every day | ORAL | 3 refills | Status: DC

## 2024-02-19 NOTE — Patient Instructions (Signed)
 Medication Instructions:  No changes *If you need a refill on your cardiac medications before your next appointment, please call your pharmacy*  Lab Work: Return in 4 months for blood work -NMR lipoprofile, hgA1c  Testing/Procedures: none  Follow-Up: At [facility], you and your health needs are our priority.  As part of our continuing mission to provide you with exceptional heart care, our providers are all part of one team.  This team includes your primary Cardiologist (physician) and Advanced Practice Providers or APPs (Physician Assistants and Nurse Practitioners) who all work together to provide you with the care you need, when you need it.  Your next appointment:   9 month(s)  Provider:   Vina Gull, MD

## 2024-02-29 ENCOUNTER — Other Ambulatory Visit: Payer: Self-pay | Admitting: Internal Medicine

## 2024-03-15 ENCOUNTER — Encounter: Payer: Self-pay | Admitting: Family Medicine

## 2024-03-16 MED ORDER — GABAPENTIN 100 MG PO CAPS: 100.0000 mg | ORAL_CAPSULE | Freq: Every day | ORAL | 1 refills | Status: AC

## 2024-04-26 ENCOUNTER — Other Ambulatory Visit: Payer: Self-pay | Admitting: Family Medicine

## 2024-04-26 ENCOUNTER — Other Ambulatory Visit: Payer: Self-pay | Admitting: Internal Medicine

## 2024-04-26 DIAGNOSIS — E785 Hyperlipidemia, unspecified: Secondary | ICD-10-CM

## 2024-04-28 ENCOUNTER — Other Ambulatory Visit: Payer: Self-pay | Admitting: Internal Medicine

## 2024-05-04 ENCOUNTER — Other Ambulatory Visit: Payer: Self-pay | Admitting: Family Medicine

## 2024-05-05 ENCOUNTER — Encounter: Payer: Self-pay | Admitting: Family Medicine

## 2024-05-05 DIAGNOSIS — E119 Type 2 diabetes mellitus without complications: Secondary | ICD-10-CM

## 2024-05-18 ENCOUNTER — Other Ambulatory Visit: Payer: Self-pay

## 2024-05-18 MED ORDER — TRUE METRIX BLOOD GLUCOSE TEST VI STRP: ORAL_STRIP | 3 refills | Status: AC

## 2024-06-02 ENCOUNTER — Ambulatory Visit: Admitting: Hematology & Oncology

## 2024-06-02 ENCOUNTER — Inpatient Hospital Stay: Attending: Hematology & Oncology

## 2024-06-02 ENCOUNTER — Encounter: Payer: Self-pay | Admitting: Hematology & Oncology

## 2024-06-02 VITALS — BP 136/76 | HR 79 | Temp 98.3°F | Resp 20 | Ht 72.0 in | Wt 212.1 lb

## 2024-06-02 DIAGNOSIS — C9110 Chronic lymphocytic leukemia of B-cell type not having achieved remission: Secondary | ICD-10-CM | POA: Diagnosis present

## 2024-06-02 LAB — CBC WITH DIFFERENTIAL (CANCER CENTER ONLY)
Abs Immature Granulocytes: 0.04 K/uL (ref 0.00–0.07)
Basophils Absolute: 0 K/uL (ref 0.0–0.1)
Basophils Relative: 0 %
Eosinophils Absolute: 0.2 K/uL (ref 0.0–0.5)
Eosinophils Relative: 1 %
HCT: 43.4 % (ref 39.0–52.0)
Hemoglobin: 14.8 g/dL (ref 13.0–17.0)
Immature Granulocytes: 0 %
Lymphocytes Relative: 64 %
Lymphs Abs: 10.5 K/uL — ABNORMAL HIGH (ref 0.7–4.0)
MCH: 30.9 pg (ref 26.0–34.0)
MCHC: 34.1 g/dL (ref 30.0–36.0)
MCV: 90.6 fL (ref 80.0–100.0)
Monocytes Absolute: 0.8 K/uL (ref 0.1–1.0)
Monocytes Relative: 5 %
Neutro Abs: 5.1 K/uL (ref 1.7–7.7)
Neutrophils Relative %: 30 %
Platelet Count: 218 K/uL (ref 150–400)
RBC: 4.79 MIL/uL (ref 4.22–5.81)
RDW: 12.7 % (ref 11.5–15.5)
Smear Review: NORMAL
WBC Count: 16.8 K/uL — ABNORMAL HIGH (ref 4.0–10.5)
nRBC: 0 % (ref 0.0–0.2)

## 2024-06-02 LAB — CMP (CANCER CENTER ONLY)
ALT: 22 U/L (ref 0–44)
AST: 26 U/L (ref 15–41)
Albumin: 4.3 g/dL (ref 3.5–5.0)
Alkaline Phosphatase: 67 U/L (ref 38–126)
Anion gap: 13 (ref 5–15)
BUN: 19 mg/dL (ref 8–23)
CO2: 26 mmol/L (ref 22–32)
Calcium: 9.6 mg/dL (ref 8.9–10.3)
Chloride: 103 mmol/L (ref 98–111)
Creatinine: 1.22 mg/dL (ref 0.61–1.24)
GFR, Estimated: >60 mL/min (ref >60)
Glucose, Bld: 154 mg/dL — ABNORMAL HIGH (ref 70–99)
Potassium: 3.9 mmol/L (ref 3.5–5.1)
Sodium: 142 mmol/L (ref 135–145)
Total Bilirubin: 0.8 mg/dL (ref 0.0–1.2)
Total Protein: 6.9 g/dL (ref 6.5–8.1)

## 2024-06-02 LAB — LACTATE DEHYDROGENASE: LDH: 133 U/L (ref 105–235)

## 2024-06-02 NOTE — Progress Notes (Signed)
 Hematology and Oncology Follow Up Visit  Brandon Stone [id number] 02-Nov-1945 78 y.o. 06/02/2024   Principle Diagnosis:  Stage A CLL  Current Therapy:   Observation     Interim History:  [name] is back for follow-up.  We last saw him back in June.  Since then, he has been doing okay.  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  He had shingles in the right T4-T5 dermatome.  This is improving.  He I think was taking gabapentin  but now is not taking this any longer.  He says that the pain is improving.  He has had no problems with fever.  He has had no problems with nausea or vomiting.  He has had no change in bowel or bladder habits.  He has had no issues with cough.  His appetite has been quite good.  Overall, I will say that his performance status is probably ECOG 1.     Wt Readings from Last 3 Encounters:  06/02/24 212 lb 1.9 oz (96.2 kg)  02/19/24 215 lb 12.8 oz (97.9 kg)  02/10/24 213 lb (96.6 kg)     Medications:  Current Outpatient Medications:    acetaminophen  (TYLENOL ) 325 MG tablet, Take 325 mg by mouth every 6 (six) hours as needed for mild pain., Disp: , Rfl:    allopurinol  (ZYLOPRIM ) 300 MG tablet, TAKE 1 TABLET EVERY DAY, Disp: 90 tablet, Rfl: 3   amLODipine  (NORVASC ) 10 MG tablet, TAKE 1 TABLET EVERY DAY (KEEP OFFICE VISIT), Disp: 90 tablet, Rfl: 2   aspirin  EC 81 MG tablet, Take 81 mg by mouth daily., Disp: , Rfl:    atorvastatin  (LIPITOR) 20 MG tablet, Take 1 tablet (20 mg total) by mouth daily., Disp: 90 tablet, Rfl: 1   Evolocumab  (REPATHA  SURECLICK) 140 MG/ML SOAJ, INJECT 1 PEN INTO THE SKIN EVERY 14 (FOURTEEN) DAYS., Disp: 6 mL, Rfl: 3   glipiZIDE  (GLUCOTROL  XL) 2.5 MG 24 hr tablet, Take 1 tablet (2.5 mg total) by mouth daily with breakfast., Disp: 90 tablet, Rfl: 3   glucose blood (TRUE METRIX BLOOD GLUCOSE TEST) test strip, TEST AS DIRECTED DAILY, Disp: 100 strip, Rfl: 3   icosapent  Ethyl (VASCEPA ) 1 g capsule, Take 2 capsules (2 g total) by mouth  2 (two) times daily., Disp: 360 capsule, Rfl: 3   isosorbide  mononitrate (IMDUR ) 60 MG 24 hr tablet, Take 1 tablet (60 mg total) by mouth daily., Disp: 90 tablet, Rfl: 2   metFORMIN  (GLUCOPHAGE ) 1000 MG tablet, Take 1 tablet (1,000 mg total) by mouth 2 (two) times daily with a meal., Disp: 180 tablet, Rfl: 1   metoprolol  succinate (TOPROL -XL) 25 MG 24 hr tablet, TAKE 1 TABLET IN THE MORNING AND AT BEDTIME, Disp: 180 tablet, Rfl: 3   MULTIPLE VITAMIN-FOLIC ACID PO, Take 1 tablet by mouth daily. Contains 400 mg folic acid, Disp: , Rfl:    nitroGLYCERIN  (NITROSTAT ) 0.4 MG SL tablet, Place 1 tablet (0.4 mg total) under the tongue every 5 (five) minutes as needed for chest pain., Disp: 25 tablet, Rfl: 3   omeprazole  (PRILOSEC) 20 MG capsule, TAKE 1 CAPSULE TWICE DAILY, Disp: 180 capsule, Rfl: 3   potassium chloride  SA (KLOR-CON  M) 20 MEQ tablet, TAKE 1 TABLET EVERY DAY, Disp: 90 tablet, Rfl: 3   TRUEplus Lancets 33G MISC, USE AS DIRECTED ONCE A DAY., Disp: 100 each, Rfl: 3   valsartan -hydrochlorothiazide  (DIOVAN -HCT) 160-25 MG tablet, Take 1 tablet by mouth daily., Disp: 90 tablet, Rfl: 2   gabapentin  (NEURONTIN ) 100  MG capsule, Take 1 capsule (100 mg total) by mouth at bedtime. (Patient not taking: Reported on 06/02/2024), Disp: 90 capsule, Rfl: 1   loratadine (CLARITIN) 10 MG tablet, Take 10 mg by mouth daily as needed for allergies. (Patient not taking: Reported on 06/02/2024), Disp: , Rfl:   Allergies:  Allergies  Allergen Reactions   Black Pepper [[place]] Palpitations   Codeine Palpitations and Other (See Comments)    Wild dreams, palpitations   Other Hives and Rash    opsite and tegaderm tape.   Rosuvastatin  Nausea Only   Tape Rash    Blisters from Tegaderm and tape with orange backing(opsite?)   Ticlopidine Hcl Hives and Itching    Past Medical History, Surgical history, Social history, and Family History were reviewed and updated.  Review of Systems: Review of Systems   Constitutional: Negative.   HENT:  Negative.    Eyes: Negative.   Respiratory: Negative.    Cardiovascular: Negative.   Gastrointestinal: Negative.   Endocrine: Negative.   Genitourinary: Negative.    Musculoskeletal: Negative.   Skin: Negative.   Neurological: Negative.   Hematological: Negative.   Psychiatric/Behavioral: Negative.      Physical Exam:  height is 6' (1.829 m) and weight is 212 lb 1.9 oz (96.2 kg). His oral temperature is 98.3 F (36.8 C). His blood pressure is 136/76 and his pulse is 79. His respiration is 20 and oxygen saturation is 97%.   Wt Readings from Last 3 Encounters:  06/02/24 212 lb 1.9 oz (96.2 kg)  02/19/24 215 lb 12.8 oz (97.9 kg)  02/10/24 213 lb (96.6 kg)    Physical Exam Vitals reviewed.  HENT:     Head: Normocephalic and atraumatic.  Eyes:     Pupils: Pupils are equal, round, and reactive to light.  Cardiovascular:     Rate and Rhythm: Normal rate. Rhythm irregular.     Heart sounds: Normal heart sounds.  Pulmonary:     Effort: Pulmonary effort is normal.     Breath sounds: Normal breath sounds.  Abdominal:     General: Bowel sounds are normal.     Palpations: Abdomen is soft.  Musculoskeletal:        General: No tenderness or deformity. Normal range of motion.     Cervical back: Normal range of motion.  Lymphadenopathy:     Cervical: No cervical adenopathy.  Skin:    General: Skin is warm and dry.     Findings: No erythema or rash.     Comments: Skin exam does show a scarred shingles rash in the right T4-T5 dermatome.  Neurological:     Mental Status: He is alert and oriented to person, place, and time.  Psychiatric:        Behavior: Behavior normal.        Thought Content: Thought content normal.        Judgment: Judgment normal.      Lab Results  Component Value Date   WBC 16.8 (H) 06/02/2024   HGB 14.8 06/02/2024   HCT 43.4 06/02/2024   MCV 90.6 06/02/2024   PLT 218 06/02/2024     Chemistry      Component  Value Date/Time   NA 142 06/02/2024 1008   NA 143 06/01/2020 0900   NA 143 06/11/2017 1044   NA 140 04/27/2014 0815   K 3.9 06/02/2024 1008   K 3.2 (L) 06/11/2017 1044   K 3.7 04/27/2014 0815   CL 103 06/02/2024 1008   CL  101 06/11/2017 1044   CO2 26 06/02/2024 1008   CO2 28 06/11/2017 1044   CO2 26 04/27/2014 0815   BUN 19 06/02/2024 1008   BUN 18 06/01/2020 0900   BUN 17 06/11/2017 1044   BUN 18.5 04/27/2014 0815   CREATININE 1.22 06/02/2024 1008   CREATININE 1.1 06/11/2017 1044   CREATININE 1.2 04/27/2014 0815      Component Value Date/Time   CALCIUM  9.6 06/02/2024 1008   CALCIUM  9.7 06/11/2017 1044   CALCIUM  9.8 04/27/2014 0815   ALKPHOS 67 06/02/2024 1008   ALKPHOS 83 06/11/2017 1044   ALKPHOS 71 04/27/2014 0815   AST 26 06/02/2024 1008   AST 18 04/27/2014 0815   ALT 22 06/02/2024 1008   ALT 32 06/11/2017 1044   ALT 25 04/27/2014 0815   BILITOT 0.8 06/02/2024 1008   BILITOT 0.67 04/27/2014 0815      Impression and Plan: Mr. Lindholm is a 78 year old white male.  He has stage A CLL.  He has minimal lymphocytosis.  In fact, in the past 6 months, there really has been no change in his blood counts.  His exam is unremarkable.  I do not feel any adenopathy nor any splenomegaly.  I think we can probably move his appointments out to once a year now.  I feel confident that we can go once a year.  He has any problems between now in 1 year, we can certainly get him back.  Maude JONELLE Crease, MD 12/11/202511:41 AM

## 2024-06-28 ENCOUNTER — Ambulatory Visit: Payer: Self-pay | Admitting: Internal Medicine

## 2024-06-28 LAB — NMR, LIPOPROFILE
Cholesterol, Total: 99 mg/dL — ABNORMAL LOW (ref 100–199)
HDL Particle Number: 30.5 umol/L
HDL-C: 38 mg/dL — ABNORMAL LOW
LDL Particle Number: 521 nmol/L
LDL Size: 20.5 nm — ABNORMAL LOW
LDL-C (NIH Calc): 35 mg/dL (ref 0–99)
LP-IR Score: 74 — ABNORMAL HIGH
Small LDL Particle Number: 198 nmol/L
Triglycerides: 151 mg/dL — ABNORMAL HIGH (ref 0–149)

## 2024-06-28 LAB — HEMOGLOBIN A1C
Est. average glucose Bld gHb Est-mCnc: 160 mg/dL
Hgb A1c MFr Bld: 7.2 % — ABNORMAL HIGH (ref 4.8–5.6)

## 2024-07-27 ENCOUNTER — Other Ambulatory Visit: Payer: Self-pay | Admitting: Internal Medicine

## 2024-07-27 DIAGNOSIS — E785 Hyperlipidemia, unspecified: Secondary | ICD-10-CM

## 2024-11-09 ENCOUNTER — Ambulatory Visit

## 2025-06-02 ENCOUNTER — Inpatient Hospital Stay: Admitting: Hematology & Oncology

## 2025-06-02 ENCOUNTER — Inpatient Hospital Stay
